# Patient Record
Sex: Female | Born: 1935 | Race: White | Hispanic: No | Smoking: Former smoker
Health system: Southern US, Community
[De-identification: ages and names within clinical notes are randomized; demographics above are authoritative.]

## PROBLEM LIST (undated history)

## (undated) DIAGNOSIS — C3490 Malignant neoplasm of unspecified part of unspecified bronchus or lung: Secondary | ICD-10-CM

## (undated) DIAGNOSIS — K259 Gastric ulcer, unspecified as acute or chronic, without hemorrhage or perforation: Secondary | ICD-10-CM

## (undated) DIAGNOSIS — J910 Malignant pleural effusion: Secondary | ICD-10-CM

## (undated) DIAGNOSIS — J189 Pneumonia, unspecified organism: Secondary | ICD-10-CM

## (undated) DIAGNOSIS — I480 Paroxysmal atrial fibrillation: Secondary | ICD-10-CM

## (undated) DIAGNOSIS — S62109A Fracture of unspecified carpal bone, unspecified wrist, initial encounter for closed fracture: Secondary | ICD-10-CM

## (undated) DIAGNOSIS — J449 Chronic obstructive pulmonary disease, unspecified: Secondary | ICD-10-CM

## (undated) HISTORY — DX: Paroxysmal atrial fibrillation: I48.0

## (undated) HISTORY — DX: Malignant pleural effusion: J91.0

## (undated) HISTORY — PX: CHOLECYSTECTOMY: SHX55

## (undated) HISTORY — DX: Malignant neoplasm of unspecified part of unspecified bronchus or lung: C34.90

## (undated) HISTORY — PX: CATARACT EXTRACTION W/ INTRAOCULAR LENS  IMPLANT, BILATERAL: SHX1307

## (undated) HISTORY — PX: TONSILLECTOMY: SUR1361

## (undated) HISTORY — PX: ABDOMINAL HYSTERECTOMY: SHX81

---

## 1991-03-21 DIAGNOSIS — S62109A Fracture of unspecified carpal bone, unspecified wrist, initial encounter for closed fracture: Secondary | ICD-10-CM

## 1991-03-21 HISTORY — DX: Fracture of unspecified carpal bone, unspecified wrist, initial encounter for closed fracture: S62.109A

## 2008-07-24 ENCOUNTER — Ambulatory Visit: Payer: Self-pay | Admitting: Internal Medicine

## 2009-09-21 ENCOUNTER — Ambulatory Visit: Payer: Self-pay | Admitting: Unknown Physician Specialty

## 2009-11-17 ENCOUNTER — Ambulatory Visit: Payer: Self-pay | Admitting: Unknown Physician Specialty

## 2009-11-19 LAB — PATHOLOGY REPORT

## 2013-09-26 ENCOUNTER — Ambulatory Visit: Payer: Self-pay | Admitting: Osteopathic Medicine

## 2015-02-22 ENCOUNTER — Encounter: Payer: Medicare HMO | Attending: Surgery | Admitting: Surgery

## 2015-02-22 DIAGNOSIS — T8131XA Disruption of external operation (surgical) wound, not elsewhere classified, initial encounter: Secondary | ICD-10-CM | POA: Diagnosis not present

## 2015-02-22 DIAGNOSIS — X58XXXA Exposure to other specified factors, initial encounter: Secondary | ICD-10-CM | POA: Diagnosis not present

## 2015-02-22 DIAGNOSIS — C44722 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, including hip: Secondary | ICD-10-CM | POA: Diagnosis not present

## 2015-02-22 DIAGNOSIS — M1990 Unspecified osteoarthritis, unspecified site: Secondary | ICD-10-CM | POA: Diagnosis not present

## 2015-02-22 DIAGNOSIS — F17218 Nicotine dependence, cigarettes, with other nicotine-induced disorders: Secondary | ICD-10-CM | POA: Diagnosis not present

## 2015-02-22 DIAGNOSIS — L97212 Non-pressure chronic ulcer of right calf with fat layer exposed: Secondary | ICD-10-CM | POA: Insufficient documentation

## 2015-02-22 NOTE — Progress Notes (Addendum)
[name] ([id number]) Visit Report for 02/22/2015 Chief Complaint Document Details Patient Name: [name], [name]. Date of Service: 02/22/2015 8:45 AM Medical Record Number: [id number] Patient Account Number: [id number] Date of Birth/Sex: 03-13-1933 (81 y.o. Female) Treating RN: Cornell Barman Primary Care Physician: Benita Stabile Other Clinician: Referring Physician: Benita Stabile Treating Physician/Extender: Frann Rider in Treatment: 0 Information Obtained from: Patient Chief Complaint Patient presents to the wound care center with open non-healing surgical wound(s) which has been there on the right lower extremity for about 4 weeks Electronic Signature(s) Signed: 02/22/2015 10:04:29 AM By: Christin Fudge MD, FACS Previous Signature: 02/22/2015 9:11:41 AM Version By: Christin Fudge MD, FACS Entered By: Christin Fudge on 02/22/2015 10:04:29 Hodgkiss, [name] ([id number]) -------------------------------------------------------------------------------- Debridement Details Patient Name: [name]. Date of Service: 02/22/2015 8:45 AM Medical Record Number: [id number] Patient Account Number: [id number] Date of Birth/Sex: 1932/04/15 (81 y.o. Female) Treating RN: Cornell Barman Primary Care Physician: Benita Stabile Other Clinician: Referring Physician: Benita Stabile Treating Physician/Extender: Frann Rider in Treatment: 0 Debridement Performed for Wound #1 Right,Anterior Lower Leg Assessment: Performed By: Physician Christin Fudge, MD Debridement: Debridement Pre-procedure Yes Verification/Time Out Taken: Start Time: 09:50 Pain Control: Other : lidocaine 4% Level: Skin/Subcutaneous Tissue Total Area Debrided (L x 3.2 (cm) x 2.8 (cm) = 8.96 (cm) W): Tissue and other Viable, Non-Viable, Fibrin/Slough, Subcutaneous material debrided: Instrument: Curette Bleeding: Moderate Hemostasis Achieved: Pressure End Time: 09:55 Procedural Pain: 3 Post Procedural Pain: 1 Response to  Treatment: Procedure was tolerated well Post Debridement Measurements of Total Wound Length: (cm) 3.2 Width: (cm) 2.8 Depth: (cm) 0.5 Volume: (cm) 3.519 Post Procedure Diagnosis Same as Pre-procedure Electronic Signature(s) Signed: 02/22/2015 10:04:10 AM By: Christin Fudge MD, FACS Signed: 02/22/2015 5:12:08 PM By: Gretta Cool RN, BSN, Kim RN, BSN Entered By: Christin Fudge on 02/22/2015 10:04:10 Poppen, [name] ([id number]) -------------------------------------------------------------------------------- HPI Details Patient Name: [name]. Date of Service: 02/22/2015 8:45 AM Medical Record Number: [id number] Patient Account Number: [id number] Date of Birth/Sex: [birth year] (81 y.o. Female) Treating RN: Cornell Barman Primary Care Physician: Benita Stabile Other Clinician: Referring Physician: Benita Stabile Treating Physician/Extender: Frann Rider in Treatment: 0 History of Present Illness Location: right lower extremity Quality: Patient reports experiencing a dull pain to affected area(s). Severity: Patient states wound are getting worse. Duration: Patient has had the wound for < 4 weeks prior to presenting for treatment Timing: Pain in wound is Intermittent (comes and goes Context: The wound occurred when the patient habits sutures removed from a dermatology excision of a squamous cell carcinoma Modifying Factors: Other treatment(s) tried include: she is on doxycycline at the present time and prior to that had Keflex Associated Signs and Symptoms: Patient reports having difficulty standing for long periods. HPI Description: 81 year old patient sent to [place] by her dermatologist Dr. Kirkland Hun for a decreased surgical wound of the right lower extremity where a excision and primary repair was done in early November. Pathology showed a squamous cell carcinoma with no residual tumor seen. Postoperatively the patient was put on antibiotics for a wound infection and initially she had Keflex  given by her PCP. The patient was then switched to doxycycline by the dermatologist for a presumed MRSA. Sutures were removed 3 weeks later and there was partial dehiscence of the surgical wound with no purulent drainage. At the present time the patient says she has minimal pain but no pus drainage or fever. The patient is a smoker and smokes a packet of cigarettes for the last 65 years  Electronic Signature(s) Signed: 02/22/2015 10:06:10 AM By: Christin Fudge MD, FACS Previous Signature: 02/22/2015 9:14:57 AM Version By: Christin Fudge MD, FACS Entered By: Christin Fudge on 02/22/2015 10:06:09 Shellenbarger, [name] ([id number]) -------------------------------------------------------------------------------- Physical Exam Details Patient Name: [name]. Date of Service: 02/22/2015 8:45 AM Medical Record Number: [id number] Patient Account Number: [id number] Date of Birth/Sex: 07/15/1932 (81 y.o. Female) Treating RN: Cornell Barman Primary Care Physician: Benita Stabile Other Clinician: Referring Physician: Benita Stabile Treating Physician/Extender: Frann Rider in Treatment: 0 Constitutional . Pulse regular. Respirations normal and unlabored. Afebrile. . Eyes Nonicteric. Reactive to light. Ears, Nose, Mouth, and Throat Lips, teeth, and gums WNL.Marland Kitchen Moist mucosa without lesions . Neck supple and nontender. No palpable supraclavicular or cervical adenopathy. Normal sized without goiter. Respiratory WNL. No retractions.. Cardiovascular Pedal Pulses WNL. her ABI on the right lower extremity is 1.06.. No clubbing, cyanosis or edema. Gastrointestinal (GI) Abdomen without masses or tenderness.. No liver or spleen enlargement or tenderness.. Lymphatic No adneopathy. No adenopathy. No adenopathy. Musculoskeletal Adexa without tenderness or enlargement.. Digits and nails w/o clubbing, cyanosis, infection, petechiae, ischemia, or inflammatory conditions.. Integumentary (Hair, Skin) No suspicious  lesions. No crepitus or fluctuance. No peri-wound warmth or erythema. No masses.Marland Kitchen Psychiatric Judgement and insight Intact.. No evidence of depression, anxiety, or agitation.. Notes She has a large open wound with significant undermining and a lot of slough at the base. there are some smaller puncture wounds also superior and inferior to the main wound. Electronic Signature(s) Signed: 02/22/2015 10:17:09 AM By: Christin Fudge MD, FACS Entered By: Christin Fudge on 02/22/2015 10:17:08 Neidlinger, [name] ([id number]) -------------------------------------------------------------------------------- Physician Orders Details Patient Name: [name]. Date of Service: 02/22/2015 8:45 AM Medical Record Number: [id number] Patient Account Number: [id number] Date of Birth/Sex: July 18, 1932 (81 y.o. Female) Treating RN: Cornell Barman Primary Care Physician: Benita Stabile Other Clinician: Referring Physician: Benita Stabile Treating Physician/Extender: Frann Rider in Treatment: 0 Verbal / Phone Orders: Yes Clinician: Cornell Barman Read Back and Verified: Yes Diagnosis Coding ICD-10 Coding Code Description [phone] 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, including hip Disruption of external operation (surgical) wound, not elsewhere classified, initial T81.31XA encounter L97.212 Non-pressure chronic ulcer of right calf with fat layer exposed F17.218 Nicotine dependence, cigarettes, with other nicotine-induced disorders Wound Cleansing Wound #1 Right,Anterior Lower Leg o Clean wound with Normal Saline. Anesthetic Wound #1 Right,Anterior Lower Leg o Topical Lidocaine 4% cream applied to wound bed prior to debridement Primary Wound Dressing Wound #1 Right,Anterior Lower Leg o Santyl Ointment Secondary Dressing Wound #1 Right,Anterior Lower Leg o Boardered Foam Dressing Dressing Change Frequency Wound #1 Right,Anterior Lower Leg o Change dressing every day. Follow-up  Appointments Wound #1 Right,Anterior Lower Leg o Return Appointment in 1 week. Edema Control Wound #1 Right,Anterior Lower Leg Balke, Starla R. ([id number]) o Patient to wear own compression stockings o Elevate legs to the level of the heart and pump ankles as often as possible Additional Orders / Instructions Wound #1 Right,Anterior Lower Leg o Stop Smoking Patient Medications Allergies: sulfamethizole Notifications Medication Indication Start End Santyl 02/22/2015 DOSE topical 250 unit/gram ointment - ointment topical as directed Electronic Signature(s) Signed: 02/22/2015 10:03:36 AM By: Christin Fudge MD, FACS Entered By: Christin Fudge on 02/22/2015 10:03:36 Erler, [name] ([id number]) -------------------------------------------------------------------------------- Problem List Details Patient Name: [name]. Date of Service: 02/22/2015 8:45 AM Medical Record Number: [id number] Patient Account Number: [id number] Date of Birth/Sex: [birth year] (81 y.o. Female) Treating RN: Cornell Barman Primary Care Physician: Benita Stabile Other Clinician:  Referring Physician: TATE, Sharlet Salina Treating Physician/Extender: Frann Rider in Treatment: 0 Active Problems ICD-10 Encounter Code Description Active Date Diagnosis C44.722 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, 02/22/2015 Yes including hip T81.31XA Disruption of external operation (surgical) wound, not 02/22/2015 Yes elsewhere classified, initial encounter L97.212 Non-pressure chronic ulcer of right calf with fat layer 02/22/2015 Yes exposed F17.218 Nicotine dependence, cigarettes, with other nicotine- 02/22/2015 Yes induced disorders Inactive Problems Resolved Problems Electronic Signature(s) Signed: 02/22/2015 10:03:59 AM By: Christin Fudge MD, FACS Previous Signature: 02/22/2015 9:15:23 AM Version By: Christin Fudge MD, FACS Previous Signature: 02/22/2015 9:11:27 AM Version By: Christin Fudge MD, FACS Entered By:  Christin Fudge on 02/22/2015 10:03:59 Aguayo, [name] ([id number]) -------------------------------------------------------------------------------- Progress Note Details Patient Name: Debois, [name]. Date of Service: 02/22/2015 8:45 AM Medical Record Number: [id number] Patient Account Number: [id number] Date of Birth/Sex: May 15, 1932 (81 y.o. Female) Treating RN: Cornell Barman Primary Care Physician: Benita Stabile Other Clinician: Referring Physician: Benita Stabile Treating Physician/Extender: Frann Rider in Treatment: 0 Subjective Chief Complaint Information obtained from Patient Patient presents to the wound care center with open non-healing surgical wound(s) which has been there on the right lower extremity for about 4 weeks History of Present Illness (HPI) The following HPI elements were documented for the patient's wound: Location: right lower extremity Quality: Patient reports experiencing a dull pain to affected area(s). Severity: Patient states wound are getting worse. Duration: Patient has had the wound for < 4 weeks prior to presenting for treatment Timing: Pain in wound is Intermittent (comes and goes Context: The wound occurred when the patient habits sutures removed from a dermatology excision of a squamous cell carcinoma Modifying Factors: Other treatment(s) tried include: she is on doxycycline at the present time and prior to that had Keflex Associated Signs and Symptoms: Patient reports having difficulty standing for long periods. 81 year old patient sent to [place] by her dermatologist Dr. Kirkland Hun for a decreased surgical wound of the right lower extremity where a excision and primary repair was done in early November. Pathology showed a squamous cell carcinoma with no residual tumor seen. Postoperatively the patient was put on antibiotics for a wound infection and initially she had Keflex given by her PCP. The patient was then switched to doxycycline by the  dermatologist for a presumed MRSA. Sutures were removed 3 weeks later and there was partial dehiscence of the surgical wound with no purulent drainage. At the present time the patient says she has minimal pain but no pus drainage or fever. The patient is a smoker and smokes a packet of cigarettes for the last 65 years Wound History Patient presents with 1 open wound that has been present for approximately 4 weeks. Patient has been treating wound in the following manner: wet to dry and vasaline. Laboratory tests have been performed in the last month. Patient reportedly has tested positive for an antibiotic resistant organism. Patient reportedly has not tested positive for osteomyelitis. Patient reportedly has not had testing performed to evaluate circulation in the legs. Patient experiences the following problems associated with their wounds: infection. Patient History Information obtained from Patient. GAYLA, BENN ([id number]) Allergies sulfamethizole Family History Heart Disease - Mother, Maternal Grandparents, Hypertension - Child, Stroke - Mother, No family history of Cancer, Diabetes, Hereditary Spherocytosis, Kidney Disease, Lung Disease, Seizures, Thyroid Problems. Social History Current every day smoker, Marital Status - Widowed, Alcohol Use - Never, Drug Use - No History, Caffeine Use - Daily. Medical History Eyes Patient has history of Cataracts -  surgery Denies history of Glaucoma, Optic Neuritis Ear/Nose/Mouth/Throat Denies history of Chronic sinus problems/congestion, Middle ear problems Hematologic/Lymphatic Denies history of Anemia, Hemophilia, Human Immunodeficiency Virus, Lymphedema Respiratory Denies history of Aspiration, Asthma, Chronic Obstructive Pulmonary Disease (COPD), Pneumothorax, Sleep Apnea, Tuberculosis Cardiovascular Denies history of Angina, Arrhythmia, Congestive Heart Failure, Coronary Artery Disease, Deep Vein Thrombosis, Hypertension,  Hypotension, Myocardial Infarction, Peripheral Arterial Disease, Peripheral Venous Disease, Phlebitis, Vasculitis Gastrointestinal Denies history of Cirrhosis , Colitis, Crohn s, Hepatitis A, Hepatitis B, Hepatitis C Endocrine Denies history of Type I Diabetes, Type II Diabetes Genitourinary Denies history of End Stage Renal Disease Immunological Denies history of Lupus Erythematosus, Raynaud s, Scleroderma Integumentary (Skin) Denies history of History of Burn, History of pressure wounds Musculoskeletal Patient has history of Osteoarthritis Denies history of Gout, Rheumatoid Arthritis, Osteomyelitis Neurologic Denies history of Dementia, Neuropathy, Quadriplegia, Paraplegia, Seizure Disorder Oncologic Denies history of Received Chemotherapy, Received Radiation Psychiatric Denies history of Anorexia/bulimia, Confinement Anxiety Medical And Surgical History Notes Constitutional Symptoms (General Health) Heidel, ADDLEY BALLINGER. ([id number]) gall bladder surgery 15 years ago Review of Systems (ROS) Constitutional Symptoms (General Health) The patient has no complaints or symptoms. Eyes The patient has no complaints or symptoms. Ear/Nose/Mouth/Throat The patient has no complaints or symptoms. Hematologic/Lymphatic The patient has no complaints or symptoms. Respiratory The patient has no complaints or symptoms. Cardiovascular Complains or has symptoms of LE edema. Gastrointestinal The patient has no complaints or symptoms. Endocrine The patient has no complaints or symptoms. Genitourinary The patient has no complaints or symptoms. Immunological The patient has no complaints or symptoms. Integumentary (Skin) Complains or has symptoms of Wounds, Bleeding or bruising tendency. Denies complaints or symptoms of Breakdown, Swelling. Musculoskeletal The patient has no complaints or symptoms. Neurologic The patient has no complaints or symptoms. Oncologic The patient has no complaints  or symptoms. Psychiatric The patient has no complaints or symptoms. Medications doxycycline hyclate 100 mg capsule oral 1 1 capsule oral Santyl 250 unit/gram topical ointment topical ointment topical as directed Objective Constitutional Steadman, Marylyn R. ([id number]) Pulse regular. Respirations normal and unlabored. Afebrile. Vitals Time Taken: 9:08 AM, Height: 63 in, Source: Stated, Weight: 180 lbs, Source: Stated, BMI: 31.9, Temperature: 98.2 F, Pulse: 72 bpm, Respiratory Rate: 16 breaths/min, Blood Pressure: 136/55 mmHg. Eyes Nonicteric. Reactive to light. Ears, Nose, Mouth, and Throat Lips, teeth, and gums WNL.Marland Kitchen Moist mucosa without lesions . Neck supple and nontender. No palpable supraclavicular or cervical adenopathy. Normal sized without goiter. Respiratory WNL. No retractions.. Cardiovascular Pedal Pulses WNL. her ABI on the right lower extremity is 1.06.. No clubbing, cyanosis or edema. Gastrointestinal (GI) Abdomen without masses or tenderness.. No liver or spleen enlargement or tenderness.. Lymphatic No adneopathy. No adenopathy. No adenopathy. Musculoskeletal Adexa without tenderness or enlargement.. Digits and nails w/o clubbing, cyanosis, infection, petechiae, ischemia, or inflammatory conditions.Marland Kitchen Psychiatric Judgement and insight Intact.. No evidence of depression, anxiety, or agitation.. General Notes: She has a large open wound with significant undermining and a lot of slough at the base. there are some smaller puncture wounds also superior and inferior to the main wound. Integumentary (Hair, Skin) No suspicious lesions. No crepitus or fluctuance. No peri-wound warmth or erythema. No masses.. Wound #1 status is Open. Original cause of wound was Surgical Injury. The wound is located on the Right,Anterior Lower Leg. The wound measures 3.2cm length x 2.8cm width x 0.4cm depth; 7.037cm^2 area and 2.815cm^3 volume. The wound is limited to skin breakdown. There is no  tunneling noted, however, there is undermining starting at 10:00 and  ending at 3:00 with a maximum distance of 0.9cm. There is a medium amount of serous drainage noted. The wound margin is distinct with the outline attached to the wound base. There is small (1-33%) pink granulation within the wound bed. There is a medium (34- 66%) amount of necrotic tissue within the wound bed including Adherent Slough. The periwound skin appearance exhibited: Scarring, Moist, Erythema. The periwound skin appearance did not exhibit: Callus, Crepitus, Excoriation, Fluctuance, Friable, Induration, Localized Edema, Rash, Dry/Scaly, Maceration, Atrophie Blanche, Cyanosis, Ecchymosis, Hemosiderin Staining, Mottled, Pallor, Rubor. The surrounding Tarazon, Cleopha R. ([id number]) wound skin color is noted with erythema which is circumferential. Periwound temperature was noted as No Abnormality. The periwound has tenderness on palpation. Assessment Active Problems ICD-10 C44.722 - 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, including hip T81.31XA - Disruption of external operation (surgical) wound, not elsewhere classified, initial encounter L97.212 - Non-pressure chronic ulcer of right calf with fat layer exposed F17.218 - Nicotine dependence, cigarettes, with other nicotine-induced disorders This 81 year old patient with a surgical dehiscence from a squamous cell carcinoma skin excision and closure now has MRSA cultured from the wound. I have recommended she completes her course of doxycycline which was already prescribed. I have recommended Santyl ointment to be changed daily and aborted for him to be applied. She is also using some compression stockings which will help with her minimal edema. I spent about 5 minutes discussing with her the need to completely give up smoking and the detrimental effects of nicotine on wound healing. She and her granddaughter have at all questions answered about this and say  they will be compliant. She will come back and see me at weekly intervals. Procedures Wound #1 Wound #1 is a Malignant Wound located on the Right,Anterior Lower Leg . There was a Skin/Subcutaneous Tissue Debridement ([id number]) debridement with total area of 8.96 sq cm performed by Christin Fudge, MD. with the following instrument(s): Curette to remove Viable and Non-Viable tissue/material including Fibrin/Slough and Subcutaneous after achieving pain control using Other (lidocaine 4%). A time out was conducted prior to the start of the procedure. A Moderate amount of bleeding was controlled with Pressure. The procedure was tolerated well with a pain level of 3 throughout and a pain level of 1 following the procedure. Post Debridement Measurements: 3.2cm length x 2.8cm width x 0.5cm depth; 3.519cm^3 volume. Post procedure Diagnosis Wound #1: Same as Pre-Procedure Strawderman, Jani R. ([id number]) Plan Wound Cleansing: Wound #1 Right,Anterior Lower Leg: Clean wound with Normal Saline. Anesthetic: Wound #1 Right,Anterior Lower Leg: Topical Lidocaine 4% cream applied to wound bed prior to debridement Primary Wound Dressing: Wound #1 Right,Anterior Lower Leg: Santyl Ointment Secondary Dressing: Wound #1 Right,Anterior Lower Leg: Boardered Foam Dressing Dressing Change Frequency: Wound #1 Right,Anterior Lower Leg: Change dressing every day. Follow-up Appointments: Wound #1 Right,Anterior Lower Leg: Return Appointment in 1 week. Edema Control: Wound #1 Right,Anterior Lower Leg: Patient to wear own compression stockings Elevate legs to the level of the heart and pump ankles as often as possible Additional Orders / Instructions: Wound #1 Right,Anterior Lower Leg: Stop Smoking The following medication(s) was prescribed: Santyl topical 250 unit/gram ointment ointment topical as directed starting 02/22/2015 This 81 year old patient with a surgical dehiscence from a squamous cell carcinoma  skin excision and closure now has MRSA cultured from the wound. I have recommended she completes her course of doxycycline which was already prescribed. I have recommended Santyl ointment to be changed daily and aborted for him to be applied. She  is also using some compression stockings which will help with her minimal edema. I spent about 5 minutes discussing with her the need to completely give up smoking and the detrimental effects of nicotine on wound healing. She and her granddaughter have at all questions answered about this and say they will be compliant. PORCHA, DEBLANC ([id number]) She will come back and see me at weekly intervals. Electronic Signature(s) Signed: 02/22/2015 4:23:28 PM By: Christin Fudge MD, FACS Previous Signature: 02/22/2015 10:18:52 AM Version By: Christin Fudge MD, FACS Entered By: Christin Fudge on 02/22/2015 16:23:28 Gavitt, [name] ([id number]) -------------------------------------------------------------------------------- ROS/PFSH Details Patient Name: [name]. Date of Service: 02/22/2015 8:45 AM Medical Record Number: [id number] Patient Account Number: [id number] Date of Birth/Sex: 04-Nov-1932 (81 y.o. Female) Treating RN: Cornell Barman Primary Care Physician: Benita Stabile Other Clinician: Referring Physician: Benita Stabile Treating Physician/Extender: Frann Rider in Treatment: 0 Information Obtained From Patient Wound History Do you currently have one or more open woundso Yes How many open wounds do you currently haveo 1 Approximately how long have you had your woundso 4 weeks How have you been treating your wound(s) until nowo wet to dry and vasaline Has your wound(s) ever healed and then re-openedo No Have you had any lab work done in the past montho Yes Have you tested positive for an antibiotic resistant organism (MRSA, VRE)o Yes Date: 02/02/2015 Have you tested positive for osteomyelitis (bone infection)o No Have you had any tests for  circulation on your legso No Have you had other problems associated with your woundso Infection Cardiovascular Complaints and Symptoms: Positive for: LE edema Medical History: Negative for: Angina; Arrhythmia; Congestive Heart Failure; Coronary Artery Disease; Deep Vein Thrombosis; Hypertension; Hypotension; Myocardial Infarction; Peripheral Arterial Disease; Peripheral Venous Disease; Phlebitis; Vasculitis Integumentary (Skin) Complaints and Symptoms: Positive for: Wounds; Bleeding or bruising tendency Negative for: Breakdown; Swelling Medical History: Negative for: History of Burn; History of pressure wounds Constitutional Symptoms (General Health) Complaints and Symptoms: No Complaints or Symptoms Medical History: Past Medical History Notes: CLARABELLE, OSCARSON. ([id number]) gall bladder surgery 15 years ago Eyes Complaints and Symptoms: No Complaints or Symptoms Medical History: Positive for: Cataracts - surgery Negative for: Glaucoma; Optic Neuritis Ear/Nose/Mouth/Throat Complaints and Symptoms: No Complaints or Symptoms Medical History: Negative for: Chronic sinus problems/congestion; Middle ear problems Hematologic/Lymphatic Complaints and Symptoms: No Complaints or Symptoms Medical History: Negative for: Anemia; Hemophilia; Human Immunodeficiency Virus; Lymphedema Respiratory Complaints and Symptoms: No Complaints or Symptoms Medical History: Negative for: Aspiration; Asthma; Chronic Obstructive Pulmonary Disease (COPD); Pneumothorax; Sleep Apnea; Tuberculosis Gastrointestinal Complaints and Symptoms: No Complaints or Symptoms Medical History: Negative for: Cirrhosis ; Colitis; Crohnos; Hepatitis A; Hepatitis B; Hepatitis C Endocrine Complaints and Symptoms: No Complaints or Symptoms Medical History: Negative for: Type I Diabetes; Type II Diabetes Kraeger, Medora R. ([id number]) Genitourinary Complaints and Symptoms: No Complaints or Symptoms Medical  History: Negative for: End Stage Renal Disease Immunological Complaints and Symptoms: No Complaints or Symptoms Medical History: Negative for: Lupus Erythematosus; Raynaudos; Scleroderma Musculoskeletal Complaints and Symptoms: No Complaints or Symptoms Medical History: Positive for: Osteoarthritis Negative for: Gout; Rheumatoid Arthritis; Osteomyelitis Neurologic Complaints and Symptoms: No Complaints or Symptoms Medical History: Negative for: Dementia; Neuropathy; Quadriplegia; Paraplegia; Seizure Disorder Oncologic Complaints and Symptoms: No Complaints or Symptoms Medical History: Negative for: Received Chemotherapy; Received Radiation Psychiatric Complaints and Symptoms: No Complaints or Symptoms Medical History: Negative for: Anorexia/bulimia; Confinement Anxiety HBO Extended History Items Eyes: Cataracts Widjaja, Elmire R. ([id number]) Family and Social History Cancer: No; Diabetes: No; Heart  Disease: Yes - Mother, Maternal Grandparents; Hereditary Spherocytosis: No; Hypertension: Yes - Child; Kidney Disease: No; Lung Disease: No; Seizures: No; Stroke: Yes - Mother; Thyroid Problems: No; Current every day smoker; Marital Status - Widowed; Alcohol Use: Never; Drug Use: No History; Caffeine Use: Daily; Advanced Directives: No; Patient does not want information on Advanced Directives; Living Will: No; Medical Power of Attorney: No Physician Affirmation I have reviewed and agree with the above information. Electronic Signature(s) Signed: 02/22/2015 9:38:54 AM By: Christin Fudge MD, FACS Signed: 02/22/2015 5:12:08 PM By: Gretta Cool RN, BSN, Kim RN, BSN Entered By: Christin Fudge on 02/22/2015 09:38:52 Recchia, [name] ([id number]) -------------------------------------------------------------------------------- [facility] Details Patient Name: AAYRA, HORNBAKER. Date of Service: 02/22/2015 Medical Record Number: [id number] Patient Account Number: [id number] Date of Birth/Sex: 1932/06/23  (81 y.o. 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, including hip Disruption of external operation (surgical) wound, not elsewhere classified, initial T81.31XA encounter L97.212 Non-pressure chronic ulcer of right calf with fat layer exposed F17.218 Nicotine dependence, cigarettes, with other nicotine-induced disorders Facility Procedures CPT4: Description Modifier Quantity Code [id number] 99213 - WOUND CARE VISIT-LEV 3 EST PT 1 CPT4: 03474259 11042 - DEB SUBQ TISSUE 20 SQ CM/< 1 ICD-10 Description Diagnosis C44.722 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, including hip T81.31XA Disruption of external operation (surgical) wound, not elsewhere classified, initial  encounter L97.212 Non-pressure chronic ulcer of right calf with fat layer exposed CPT4: 56387564 99406-SMOKING CESSATION 3-10MINS 1 ICD-10 Description Diagnosis C44.722 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

## 2015-02-23 NOTE — Progress Notes (Signed)
LAILONI, BAQUERA ([id number]) Visit Report for 02/22/2015 Abuse/Suicide Risk Screen Details Patient Name: [name], [name]. Date of Service: 02/22/2015 8:45 AM Medical Record Number: [id number] Patient Account Number: [id number] Date of Birth/Sex: 06/05/1932 (81 y.o. Female) Treating RN: Cornell Barman Primary Care Physician: Benita Stabile Other Clinician: Referring Physician: Benita Stabile Treating Physician/Extender: Frann Rider in Treatment: 0 Abuse/Suicide Risk Screen Items Answer ABUSE/SUICIDE RISK SCREEN: Has anyone close to you tried to hurt or harm you recentlyo No Do you feel uncomfortable with anyone in your familyo No Has anyone forced you do things that you didnot want to doo No Do you have any thoughts of harming yourselfo No Patient displays signs or symptoms of abuse and/or neglect. No Electronic Signature(s) Signed: 02/22/2015 5:12:08 PM By: Gretta Cool, RN, BSN, Kim RN, BSN Entered By: Gretta Cool, RN, BSN, Kim on 02/22/2015 09:31:35 Mealy, [name] ([id number]) -------------------------------------------------------------------------------- Activities of Daily Living Details Patient Name: [name]. Date of Service: 02/22/2015 8:45 AM Medical Record Number: [id number] Patient Account Number: [id number] Date of Birth/Sex: 1933-03-19 (81 y.o. Female) Treating RN: Cornell Barman Primary Care Physician: Benita Stabile Other Clinician: Referring Physician: Benita Stabile Treating Physician/Extender: Frann Rider in Treatment: 0 Activities of Daily Living Items Answer Activities of Daily Living (Please select one for each item) Drive Automobile Completely Able Take Medications Completely Able Use Telephone Completely Able Care for Appearance Completely Able Use Toilet Completely Able Bath / Shower Completely Able Dress Self Completely Able Feed Self Completely Able Walk Completely Able Get In / Out Bed Completely Able Housework Completely Able Prepare Meals Completely  Able Handle Money Completely Able Shop for Self Completely Able Electronic Signature(s) Signed: 02/22/2015 5:12:08 PM By: Gretta Cool, RN, BSN, Kim RN, BSN Entered By: Gretta Cool, RN, BSN, Kim on 02/22/2015 09:31:54 Keo, [name] ([id number]) -------------------------------------------------------------------------------- Education Assessment Details Patient Name: [name]. Date of Service: 02/22/2015 8:45 AM Medical Record Number: [id number] Patient Account Number: [id number] Date of Birth/Sex: [birth year] (81 y.o. Female) Treating RN: Cornell Barman Primary Care Physician: Benita Stabile Other Clinician: Referring Physician: Benita Stabile Treating Physician/Extender: Frann Rider in Treatment: 0 Primary Learner Assessed: Patient Learning Preferences/Education Level/Primary Language Learning Preference: Explanation, Demonstration Highest Education Level: High School Preferred Language: English Cognitive Barrier Assessment/Beliefs Language Barrier: No Translator Needed: No Memory Deficit: No Emotional Barrier: No Cultural/Religious Beliefs Affecting Medical No Care: Physical Barrier Assessment Impaired Vision: No Impaired Hearing: Yes Hearing Aid Decreased Hand dexterity: No Knowledge/Comprehension Assessment Knowledge Level: High Comprehension Level: High Ability to understand written High instructions: Ability to understand verbal High instructions: Motivation Assessment Anxiety Level: Calm Cooperation: Cooperative Education Importance: Acknowledges Need Interest in Health Problems: Asks Questions Perception: Coherent Willingness to Engage in Self- High Management Activities: Readiness to Engage in Self- High Management Activities: Electronic Signature(s) AYIANA, WINSLETT ([id number]) Signed: 02/22/2015 5:12:08 PM By: Gretta Cool, RN, BSN, Kim RN, BSN Entered By: Gretta Cool, RN, BSN, Kim on 02/22/2015 09:32:42 Schuneman, [name]  ([id number]) -------------------------------------------------------------------------------- Fall Risk Assessment Details Patient Name: [name]. Date of Service: 02/22/2015 8:45 AM Medical Record Number: [id number] Patient Account Number: [id number] Date of Birth/Sex: May 22, 1932 (81 y.o. Female) Treating RN: Cornell Barman Primary Care Physician: Benita Stabile Other Clinician: Referring Physician: Benita Stabile Treating Physician/Extender: Frann Rider in Treatment: 0 Fall Risk Assessment Items Have you had 2 or more falls in the last 12 monthso 0 No Have you had any fall that resulted in injury in the last 12 monthso 0 No FALL RISK ASSESSMENT: History of falling - immediate or  within 3 months 0 No Secondary diagnosis 0 No Ambulatory aid None/bed rest/wheelchair/nurse 0 Yes Crutches/cane/walker 0 No Furniture 0 No IV Access/Saline Lock 0 No Gait/Training Normal/bed rest/immobile 0 Yes Weak 0 No Impaired 0 No Mental Status Oriented to own ability 0 Yes Electronic Signature(s) Signed: 02/22/2015 5:12:08 PM By: Gretta Cool, RN, BSN, Kim RN, BSN Entered By: Gretta Cool, RN, BSN, Kim on 02/22/2015 09:32:58 Rice, [name] ([id number]) -------------------------------------------------------------------------------- Foot Assessment Details Patient Name: [name]. Date of Service: 02/22/2015 8:45 AM Medical Record Number: [id number] Patient Account Number: [id number] Date of Birth/Sex: 1933-03-03 (81 y.o. Female) Treating RN: Cornell Barman Primary Care Physician: Benita Stabile Other Clinician: Referring Physician: Benita Stabile Treating Physician/Extender: Frann Rider in Treatment: 0 Foot Assessment Items Site Locations + = Sensation present, - = Sensation absent, C = Callus, U = Ulcer R = Redness, W = Warmth, M = Maceration, PU = Pre-ulcerative lesion F = Fissure, S = Swelling, D = Dryness Assessment Right: Left: Other Deformity: No No Prior Foot Ulcer: No No Prior Amputation:  No No Charcot Joint: No No Ambulatory Status: Gait: Electronic Signature(s) Signed: 02/22/2015 5:12:08 PM By: Gretta Cool, RN, BSN, Kim RN, BSN Entered By: Gretta Cool, RN, BSN, Kim on 02/22/2015 09:33:11 Gottwald, [name] ([id number]) -------------------------------------------------------------------------------- Nutrition Risk Assessment Details Patient Name: [name]. Date of Service: 02/22/2015 8:45 AM Medical Record Number: [id number] Patient Account Number: [id number] Date of Birth/Sex: 06-17-1932 (81 y.o. Female) Treating RN: Cornell Barman Primary Care Physician: Benita Stabile Other Clinician: Referring Physician: Benita Stabile Treating Physician/Extender: Frann Rider in Treatment: 0 Height (in): 63 Weight (lbs): 180 Body Mass Index (BMI): 31.9 Nutrition Risk Assessment Items NUTRITION RISK SCREEN: I have an illness or condition that made me change the kind and/or 0 No amount of food I eat I eat fewer than two meals per day 0 No I eat few fruits and vegetables, or milk products 0 No I have three or more drinks of beer, liquor or wine almost every day 0 No I have tooth or mouth problems that make it hard for me to eat 0 No I don't always have enough money to buy the food I need 0 No I eat alone most of the time 0 No I take three or more different prescribed or over-the-counter drugs a 0 No day Without wanting to, I have lost or gained 10 pounds in the last six 0 No months I am not always physically able to shop, cook and/or feed myself 0 No Nutrition Protocols Good Risk Protocol 0 No interventions needed Moderate Risk Protocol Electronic Signature(s) Signed: 02/22/2015 5:12:08 PM By: Gretta Cool, RN, BSN, Kim RN, BSN Entered By: Gretta Cool, RN, BSN, Kim on 02/22/2015 09:33:06

## 2015-02-23 NOTE — Progress Notes (Signed)
CLEMIE, GENERAL ([id number]) Visit Report for 02/22/2015 Allergy List Details Patient Name: [name], [name]. Date of Service: 02/22/2015 8:45 AM Medical Record Number: [id number] Patient Account Number: [id number] Date of Birth/Sex: 04-19-1932 (81 y.o. Female) Treating RN: Cornell Barman Primary Care Physician: Benita Stabile Other Clinician: Referring Physician: Benita Stabile Treating Physician/Extender: Frann Rider in Treatment: 0 Allergies Active Allergies sulfamethizole Allergy Notes Electronic Signature(s) Signed: 02/22/2015 5:12:08 PM By: Gretta Cool, RN, BSN, Kim RN, BSN Entered By: Gretta Cool, RN, BSN, Kim on 02/22/2015 09:25:25 Mcclenahan, [name] ([id number]) -------------------------------------------------------------------------------- Arrival Information Details Patient Name: [name]. Date of Service: 02/22/2015 8:45 AM Medical Record Number: [id number] Patient Account Number: [id number] Date of Birth/Sex: [birth year] (81 y.o. Female) Treating RN: Cornell Barman Primary Care Physician: Benita Stabile Other Clinician: Referring Physician: Benita Stabile Treating Physician/Extender: Frann Rider in Treatment: 0 Visit Information Patient Arrived: Ambulatory Arrival Time: 09:06 Accompanied By: grand- daughter Transfer Assistance: None Patient Identification Verified: Yes Secondary Verification Process Yes Completed: Patient Requires Transmission-Based Yes Precautions: Transmission-Based Precautions: Contact MRSA Patient Has Alerts: Yes Electronic Signature(s) Signed: 02/22/2015 5:12:08 PM By: Gretta Cool, RN, BSN, Kim RN, BSN Entered By: Gretta Cool, RN, BSN, Kim on 02/22/2015 09:07:03 Terriquez, [name] ([id number]) -------------------------------------------------------------------------------- Clinic Level of Care Assessment Details Patient Name: [name]. Date of Service: 02/22/2015 8:45 AM Medical Record Number: [id number] Patient Account Number: [id number] Date of Birth/Sex: 1933/02/05  (81 y.o. Female) Treating RN: Cornell Barman Primary Care Physician: Benita Stabile Other Clinician: Referring Physician: Benita Stabile Treating Physician/Extender: Frann Rider in Treatment: 0 Clinic Level of Care Assessment Items TOOL 1 Quantity Score '[]'$  - Use when EandM and Procedure is performed on INITIAL visit 0 ASSESSMENTS - Nursing Assessment / Reassessment '[]'$  - General Physical Exam (combine w/ comprehensive assessment (listed just 0 below) when performed on new pt. evals) X - Comprehensive Assessment (HX, ROS, Risk Assessments, Wounds Hx, etc.) 1 25 ASSESSMENTS - Wound and Skin Assessment / Reassessment '[]'$  - Dermatologic / Skin Assessment (not related to wound area) 0 ASSESSMENTS - Ostomy and/or Continence Assessment and Care '[]'$  - Incontinence Assessment and Management 0 '[]'$  - Ostomy Care Assessment and Management (repouching, etc.) 0 PROCESS - Coordination of Care X - Simple Patient / Family Education for ongoing care 1 15 '[]'$  - Complex (extensive) Patient / Family Education for ongoing care 0 X - Staff obtains Programmer, systems, Records, Test Results / Process Orders 1 10 '[]'$  - Staff telephones HHA, Nursing Homes / Clarify orders / etc 0 '[]'$  - Routine Transfer to another Facility (non-emergent condition) 0 '[]'$  - Routine Hospital Admission (non-emergent condition) 0 X - New Admissions / Biomedical engineer / Ordering NPWT, Apligraf, etc. 1 15 '[]'$  - Emergency Hospital Admission (emergent condition) 0 PROCESS - Special Needs '[]'$  - Pediatric / Minor Patient Management 0 '[]'$  - Isolation Patient Management 0 Sookdeo, Gale R. ([id number]) '[]'$  - Hearing / Language / Visual special needs 0 '[]'$  - Assessment of Community assistance (transportation, D/C planning, etc.) 0 '[]'$  - Additional assistance / Altered mentation 0 '[]'$  - Support Surface(s) Assessment (bed, cushion, seat, etc.) 0 INTERVENTIONS - Miscellaneous '[]'$  - External ear exam 0 '[]'$  - Patient Transfer (multiple staff / Civil Service fast streamer / Similar  devices) 0 '[]'$  - Simple Staple / Suture removal (25 or less) 0 '[]'$  - Complex Staple / Suture removal (26 or more) 0 '[]'$  - Hypo/Hyperglycemic Management (do not check if billed separately) 0 X - Ankle / Brachial Index (ABI) - do not check if billed separately 1 15 Has  the patient been seen at the hospital within the last three years: Yes Total Score: 80 Level Of Care: New/Established - Level 3 Electronic Signature(s) Signed: 02/22/2015 5:12:08 PM By: Gretta Cool, RN, BSN, Kim RN, BSN Entered By: Gretta Cool, RN, BSN, Kim on 02/22/2015 09:55:37 Rufus, [name] ([id number]) -------------------------------------------------------------------------------- Encounter Discharge Information Details Patient Name: [name]. Date of Service: 02/22/2015 8:45 AM Medical Record Number: [id number] Patient Account Number: [id number] Date of Birth/Sex: 26-Feb-1933 (81 y.o. Female) Treating RN: Cornell Barman Primary Care Physician: Benita Stabile Other Clinician: Referring Physician: Benita Stabile Treating Physician/Extender: Frann Rider in Treatment: 0 Encounter Discharge Information Items Discharge Pain Level: 0 Discharge Condition: Stable Ambulatory Status: Ambulatory Discharge Destination: Home Transportation: Private Auto Accompanied By: granddaughter Schedule Follow-up Appointment: Yes Medication Reconciliation completed and provided to Yes Patient/Care Jhania Etherington: Provided on Clinical Summary of Care: 02/22/2015 Form Type Recipient Paper Patient JW Electronic Signature(s) Signed: 02/22/2015 10:08:38 AM By: Ruthine Dose Entered By: Ruthine Dose on 02/22/2015 10:08:38 Mckeough, [name] ([id number]) -------------------------------------------------------------------------------- Lower Extremity Assessment Details Patient Name: [name]. Date of Service: 02/22/2015 8:45 AM Medical Record Number: [id number] Patient Account Number: [id number] Date of Birth/Sex: 12/06/1932 (81 y.o. Female) Treating RN:  Cornell Barman Primary Care Physician: Benita Stabile Other Clinician: Referring Physician: Benita Stabile Treating Physician/Extender: Frann Rider in Treatment: 0 Edema Assessment Assessed: [Left: No] [Right: No] E[Left: dema] [Right: :] Calf Left: Right: Point of Measurement: 33 cm From Medial Instep cm 32.6 cm Ankle Left: Right: Point of Measurement: 10 cm From Medial Instep cm 23.2 cm Vascular Assessment Pulses: Posterior Tibial Palpable: [Right:Yes] Dorsalis Pedis Palpable: [Right:Yes] Extremity colors, hair growth, and conditions: Extremity Color: [Right:Normal] Hair Growth on Extremity: [Right:No] Temperature of Extremity: [Right:Cool] Capillary Refill: [Right:< 3 seconds] Blood Pressure: Brachial: [Right:142] Dorsalis Pedis: [Left:Dorsalis Pedis: 244] Ankle: Posterior Tibial: [Left:Posterior Tibial: 150] [Right:1.06] Toe Nail Assessment Left: Right: Thick: No Discolored: No Deformed: No Improper Length and Hygiene: No Electronic Signature(s) AMAURI, MEDELLIN ([id number]) Signed: 02/22/2015 5:12:08 PM By: Gretta Cool, RN, BSN, Kim RN, BSN Entered By: Gretta Cool, RN, BSN, Kim on 02/22/2015 09:20:32 Show, [name] ([id number]) -------------------------------------------------------------------------------- Multi Wound Chart Details Patient Name: [name]. Date of Service: 02/22/2015 8:45 AM Medical Record Number: [id number] Patient Account Number: [id number] Date of Birth/Sex: 25-Jan-1933 (81 y.o. Female) Treating RN: Cornell Barman Primary Care Physician: Benita Stabile Other Clinician: Referring Physician: Benita Stabile Treating Physician/Extender: Frann Rider in Treatment: 0 Vital Signs Height(in): 63 Pulse(bpm): 72 Weight(lbs): 180 Blood Pressure 136/55 (mmHg): Body Mass Index(BMI): 32 Temperature(F): 98.2 Respiratory Rate 16 (breaths/min): Photos: [1:No Photos] [N/A:N/A] Wound Location: [1:Right Lower Leg - Anterior N/A] Wounding Event: [1:Surgical  Injury] [N/A:N/A] Primary Etiology: [1:Malignant Wound] [N/A:N/A] Date Acquired: [1:01/26/2015] [N/A:N/A] Weeks of Treatment: [1:0] [N/A:N/A] Wound Status: [1:Open] [N/A:N/A] Measurements L x W x D 3.2x2.8x0.4 [N/A:N/A] (cm) Area (cm) : [1:7.037] [N/A:N/A] Volume (cm) : [1:2.815] [N/A:N/A] Starting Position 1 10 (o'clock): Ending Position 1 [1:3] (o'clock): Maximum Distance 1 0.9 (cm): Undermining: [1:Yes] [N/A:N/A] Classification: [1:Full Thickness Without Exposed Support Structures] [N/A:N/A] Exudate Amount: [1:Small] [N/A:N/A] Exudate Type: [1:Serous] [N/A:N/A] Exudate Color: [1:amber] [N/A:N/A] Wound Margin: [1:Distinct, outline attached N/A] Granulation Amount: [1:Small (1-33%)] [N/A:N/A] Granulation Quality: [1:Pink, Hyper-granulation N/A] Necrotic Amount: [1:Medium (34-66%)] [N/A:N/A] Exposed Structures: [1:Fascia: No Fat: No] [N/A:N/A] Tendon: No Muscle: No Joint: No Bone: No Limited to Skin Breakdown Epithelialization: Small (1-33%) N/A N/A Periwound Skin Texture: Scarring: Yes N/A N/A Edema: No Excoriation: No Induration: No Callus: No Crepitus: No Fluctuance: No Friable: No Rash: No Periwound  Skin Moist: Yes N/A N/A Moisture: Maceration: No Dry/Scaly: No Periwound Skin Color: Erythema: Yes N/A N/A Atrophie Blanche: No Cyanosis: No Ecchymosis: No Hemosiderin Staining: No Mottled: No Pallor: No Rubor: No Erythema Location: Circumferential N/A N/A Temperature: No Abnormality N/A N/A Tenderness on Yes N/A N/A Palpation: Wound Preparation: Ulcer Cleansing: Not N/A N/A Cleansed Topical Anesthetic Applied: Other: lidocaine 4% Treatment Notes Electronic Signature(s) Signed: 02/22/2015 5:12:08 PM By: Gretta Cool, RN, BSN, Kim RN, BSN Entered By: Gretta Cool, RN, BSN, Kim on 02/22/2015 09:34:25 Gentzler, [name] ([id number]) -------------------------------------------------------------------------------- [place] Details Patient Name:  JEZABELLE, CHISOLM. Date of Service: 02/22/2015 8:45 AM Medical Record Number: [id number] Patient Account Number: [id number] Date of Birth/Sex: 22-Dec-1932 (81 y.o. Female) Treating RN: Cornell Barman Primary Care Physician: Benita Stabile Other Clinician: Referring Physician: Benita Stabile Treating Physician/Extender: Frann Rider in Treatment: 0 Active Inactive Malignancy/Atypical Etiology Nursing Diagnoses: Knowledge deficit related to disease process and management of atypical ulcer etiology Goals: Patient/caregiver will verbalize understanding of disease process and disease management of atypical ulcer etiology Date Initiated: 02/22/2015 Goal Status: Active Interventions: Assess patient and family medical history for signs and symptoms of malignancy/atypical etiology upon admission Notes: Orientation to the Wound Care Program Nursing Diagnoses: Knowledge deficit related to the wound healing center program Goals: Patient/caregiver will verbalize understanding of the [place] Program Date Initiated: 02/22/2015 Goal Status: Active Interventions: Provide education on orientation to the wound center Notes: Soft Tissue Infection Nursing Diagnoses: Impaired tissue integrity Potential for infection: soft tissue Enge, Lyrick R. ([id number]) Goals: Patient will remain free of wound infection Date Initiated: 02/22/2015 Goal Status: Active Patient's soft tissue infection will resolve Date Initiated: 02/22/2015 Goal Status: Active Interventions: Assess signs and symptoms of infection every visit Treatment Activities: Systemic antibiotics : 02/22/2015 Notes: Wound/Skin Impairment Nursing Diagnoses: Impaired tissue integrity Goals: Ulcer/skin breakdown will heal within 14 weeks Date Initiated: 02/22/2015 Goal Status: Active Interventions: Assess patient/caregiver ability to obtain necessary supplies Notes: Electronic Signature(s) Signed: 02/22/2015 5:12:08 PM By: Gretta Cool,  RN, BSN, Kim RN, BSN Entered By: Gretta Cool, RN, BSN, Kim on 02/22/2015 09:34:11 Schelling, [name] ([id number]) -------------------------------------------------------------------------------- Pain Assessment Details Patient Name: [name]. Date of Service: 02/22/2015 8:45 AM Medical Record Number: [id number] Patient Account Number: [id number] Date of Birth/Sex: [birth year] (81 y.o. Female) Treating RN: Cornell Barman Primary Care Physician: Benita Stabile Other Clinician: Referring Physician: Benita Stabile Treating Physician/Extender: Frann Rider in Treatment: 0 Active Problems Location of Pain Severity and Description of Pain Patient Has Paino No Site Locations Pain Management and Medication Current Pain Management: Electronic Signature(s) Signed: 02/22/2015 5:12:08 PM By: Gretta Cool, RN, BSN, Kim RN, BSN Entered By: Gretta Cool, RN, BSN, Kim on 02/22/2015 59:56:38 Rosasco, [name] ([id number]) -------------------------------------------------------------------------------- Patient/Caregiver Education Details Patient Name: [name]. Date of Service: 02/22/2015 8:45 AM Medical Record Number: [id number] Patient Account Number: [id number] Date of Birth/Gender: [birth year] (81 y.o. Female) Treating RN: Cornell Barman Primary Care Physician: Benita Stabile Other Clinician: Referring Physician: Benita Stabile Treating Physician/Extender: Frann Rider in Treatment: 0 Education Assessment Education Provided To: Patient Education Topics Provided Wound/Skin Impairment: Handouts: Caring for Your Ulcer, Other: wound care as prescribed Methods: Demonstration, Explain/Verbal Responses: State content correctly Electronic Signature(s) Signed: 02/22/2015 5:12:08 PM By: Gretta Cool, RN, BSN, Kim RN, BSN Entered By: Gretta Cool, RN, BSN, Kim on 02/22/2015 09:57:51 Lovern, [name] ([id number]) -------------------------------------------------------------------------------- Wound Assessment Details Patient Name: [name]. Date of Service: 02/22/2015 8:45 AM Medical Record Number: [id number] Patient Account Number: [id number] Date of Birth/Sex: Jul 30, 1932 (82  y.o. Female) Treating RN: Cornell Barman Primary Care Physician: Benita Stabile Other Clinician: Referring Physician: Benita Stabile Treating Physician/Extender: Frann Rider in Treatment: 0 Wound Status Wound Number: 1 Primary Etiology: Malignant Wound Wound Location: Right Lower Leg - Anterior Wound Status: Open Wounding Event: Surgical Injury Comorbid History: Cataracts, Osteoarthritis Date Acquired: 01/26/2015 Weeks Of Treatment: 0 Clustered Wound: No Photos Wound Measurements Length: (cm) 3.2 Width: (cm) 2.8 Depth: (cm) 0.4 Area: (cm) 7.037 Volume: (cm) 2.815 % Reduction in Area: 0% % Reduction in Volume: 0% Epithelialization: Small (1-33%) Tunneling: No Undermining: Yes Starting Position (o'clock): 10 Ending Position (o'clock): 3 Maximum Distance: (cm) 0.9 Wound Description Full Thickness Without Exposed Foul Odor Aft Classification: Support Structures MELANY, WIESMAN ([id number]) er Cleansing: No Wound Margin: Distinct, outline attached Exudate Medium Amount: Exudate Type: Serous Exudate Color: amber Wound Bed Granulation Amount: Small (1-33%) Exposed Structure Granulation Quality: Pink, Hyper-granulation Fascia Exposed: No Necrotic Amount: Medium (34-66%) Fat Layer Exposed: No Necrotic Quality: Adherent Slough Tendon Exposed: No Muscle Exposed: No Joint Exposed: No Bone Exposed: No Limited to Skin Breakdown Periwound Skin Texture Texture Color No Abnormalities Noted: No No Abnormalities Noted: No Callus: No Atrophie Blanche: No Crepitus: No Cyanosis: No Excoriation: No Ecchymosis: No Fluctuance: No Erythema: Yes Friable: No Erythema Location: Circumferential Induration: No Hemosiderin Staining: No Localized Edema: No Mottled: No Rash: No Pallor: No Scarring: Yes Rubor: No Moisture  Temperature / Pain No Abnormalities Noted: No Temperature: No Abnormality Dry / Scaly: No Tenderness on Palpation: Yes Maceration: No Moist: Yes Wound Preparation Ulcer Cleansing: Not Cleansed Topical Anesthetic Applied: Other: lidocaine 4%, Treatment Notes Wound #1 (Right, Anterior Lower Leg) 1. Cleansed with: Clean wound with Normal Saline 2. Anesthetic Topical Lidocaine 4% cream to wound bed prior to debridement 4. Dressing Applied: [employer], Gayla R. ([id number]) 5. Secondary Dressing Applied Bordered Foam Dressing 7. Secured with Patient to wear own compression stockings Electronic Signature(s) Signed: 02/22/2015 5:12:08 PM By: Gretta Cool, RN, BSN, Kim RN, BSN Entered By: Gretta Cool, RN, BSN, Kim on 02/22/2015 12:45:32 Broerman, [name] ([id number]) -------------------------------------------------------------------------------- [place] Details Patient Name: [name]. Date of Service: 02/22/2015 8:45 AM Medical Record Number: [id number] Patient Account Number: [id number] Date of Birth/Sex: 12-08-1932 (81 y.o. Female) Treating RN: Cornell Barman Primary Care Physician: Benita Stabile Other Clinician: Referring Physician: Benita Stabile Treating Physician/Extender: Frann Rider in Treatment: 0 Vital Signs Time Taken: 09:08 Temperature (F): 98.2 Height (in): 63 Pulse (bpm): 72 Source: Stated Respiratory Rate (breaths/min): 16 Weight (lbs): 180 Blood Pressure (mmHg): 136/55 Source: Stated Reference Range: 80 - 120 mg / dl Body Mass Index (BMI): 31.9 Electronic Signature(s) Signed: 02/22/2015 5:12:08 PM By: Gretta Cool, RN, BSN, Kim RN, BSN Entered By: Gretta Cool, RN, BSN, Kim on 02/22/2015 09:09:14

## 2015-03-01 ENCOUNTER — Encounter: Payer: Medicare HMO | Admitting: Surgery

## 2015-03-01 DIAGNOSIS — C44722 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, including hip: Secondary | ICD-10-CM | POA: Diagnosis not present

## 2015-03-02 NOTE — Progress Notes (Signed)
[name], [name] ([id number]) Visit Report for 03/01/2015 Chief Complaint Document Details Patient Name: [name], [name]. Date of Service: 03/01/2015 9:30 AM Medical Record Number: [id number] Patient Account Number: [id number] Date of Birth/Sex: Mar 26, 1932 (81 y.o. Female) Treating RN: Montey Hora Primary Care Physician: Benita Stabile Other Clinician: Referring Physician: Benita Stabile Treating Physician/Extender: Frann Rider in Treatment: 1 Information Obtained from: Patient Chief Complaint Patient presents to the wound care center with open non-healing surgical wound(s) which has been there on the right lower extremity for about 4 weeks Electronic Signature(s) Signed: 03/01/2015 10:16:22 AM By: Christin Fudge MD, FACS Entered By: Christin Fudge on 03/01/2015 10:16:22 [name], [name] ([id number]) -------------------------------------------------------------------------------- Debridement Details Patient Name: [name]. Date of Service: 03/01/2015 9:30 AM Medical Record Number: [id number] Patient Account Number: [id number] Date of Birth/Sex: Mar 29, 1932 (81 y.o. Female) Treating RN: Montey Hora Primary Care Physician: TATE, Sharlet Salina Other Clinician: Referring Physician: Benita Stabile Treating Physician/Extender: Frann Rider in Treatment: 1 Debridement Performed for Wound #1 Right,Anterior Lower Leg Assessment: Performed By: Physician Christin Fudge, MD Debridement: Debridement Pre-procedure Yes Verification/Time Out Taken: Start Time: 10:07 Pain Control: Lidocaine 4% Topical Solution Level: Skin/Subcutaneous Tissue Total Area Debrided (L x 3.3 (cm) x 2.7 (cm) = 8.91 (cm) W): Tissue and other Viable, Non-Viable, Fibrin/Slough, Subcutaneous material debrided: Instrument: Curette Bleeding: Minimum Hemostasis Achieved: Pressure End Time: 10:10 Procedural Pain: 0 Post Procedural Pain: 0 Response to Treatment: Procedure was tolerated well Post Debridement  Measurements of Total Wound Length: (cm) 3.3 Width: (cm) 2.7 Depth: (cm) 0.5 Volume: (cm) 3.499 Post Procedure Diagnosis Same as Pre-procedure Electronic Signature(s) Signed: 03/01/2015 10:16:11 AM By: Christin Fudge MD, FACS Signed: 03/01/2015 5:57:37 PM By: Montey Hora Entered By: Christin Fudge on 03/01/2015 10:16:11 [name], [name] ([id number]) -------------------------------------------------------------------------------- HPI Details Patient Name: [name]. Date of Service: 03/01/2015 9:30 AM Medical Record Number: [id number] Patient Account Number: [id number] Date of Birth/Sex: [birth year] (81 y.o. Female) Treating RN: Montey Hora Primary Care Physician: Benita Stabile Other Clinician: Referring Physician: Benita Stabile Treating Physician/Extender: Frann Rider in Treatment: 1 History of Present Illness Location: right lower extremity Quality: Patient reports experiencing a dull pain to affected area(s). Severity: Patient states wound are getting worse. Duration: Patient has had the wound for < 4 weeks prior to presenting for treatment Timing: Pain in wound is Intermittent (comes and goes Context: The wound occurred when the patient habits sutures removed from a dermatology excision of a squamous cell carcinoma Modifying Factors: Other treatment(s) tried include: she is on doxycycline at the present time and prior to that had Keflex Associated Signs and Symptoms: Patient reports having difficulty standing for long periods. HPI Description: 81 year old patient sent to [place] by her dermatologist Dr. Kirkland Hun for a decreased surgical wound of the right lower extremity where a excision and primary repair was done in early November. Pathology showed a squamous cell carcinoma with no residual tumor seen. Postoperatively the patient was put on antibiotics for a wound infection and initially she had Keflex given by her PCP. The patient was then switched to doxycycline  by the dermatologist for a presumed MRSA. Sutures were removed 3 weeks later and there was partial dehiscence of the surgical wound with no purulent drainage. At the present time the patient says she has minimal pain but no pus drainage or fever. The patient is a smoker and smokes a packet of cigarettes for the last 65 years 03/01/2015 -- the patient is still on doxycycline and this was only started on Saturday.  She continues to smoke about half pack of cigarettes a day and says she is working on quitting. She has an appointment to see her dermatologist this Wednesday. Electronic Signature(s) Signed: 03/01/2015 10:17:09 AM By: Christin Fudge MD, FACS Entered By: Christin Fudge on 03/01/2015 10:17:09 [name], [name] ([id number]) -------------------------------------------------------------------------------- Physical Exam Details Patient Name: [name], [name]. Date of Service: 03/01/2015 9:30 AM Medical Record Number: [id number] Patient Account Number: [id number] Date of Birth/Sex: 09/25/1932 (81 y.o. Female) Treating RN: Montey Hora Primary Care Physician: Benita Stabile Other Clinician: Referring Physician: Benita Stabile Treating Physician/Extender: Frann Rider in Treatment: 1 Constitutional . Pulse regular. Respirations normal and unlabored. Afebrile. . Eyes Nonicteric. Reactive to light. Ears, Nose, Mouth, and Throat Lips, teeth, and gums WNL.Marland Kitchen Moist mucosa without lesions . Neck supple and nontender. No palpable supraclavicular or cervical adenopathy. Normal sized without goiter. Respiratory WNL. No retractions.. Cardiovascular Pedal Pulses WNL. No clubbing, cyanosis or edema. Lymphatic No adneopathy. No adenopathy. No adenopathy. Musculoskeletal Adexa without tenderness or enlargement.. Digits and nails w/o clubbing, cyanosis, infection, petechiae, ischemia, or inflammatory conditions.. Integumentary (Hair, Skin) No suspicious lesions. No crepitus or fluctuance. No  peri-wound warmth or erythema. No masses.Marland Kitchen Psychiatric Judgement and insight Intact.. No evidence of depression, anxiety, or agitation.. Notes the wound continues to have significant amount of slough down to the subcutaneous tissue and this was sharply debrided with a curette. Electronic Signature(s) Signed: 03/01/2015 10:17:44 AM By: Christin Fudge MD, FACS Entered By: Christin Fudge on 03/01/2015 10:17:43 [name], [name] ([id number]) -------------------------------------------------------------------------------- Physician Orders Details Patient Name: [name]. Date of Service: 03/01/2015 9:30 AM Medical Record Number: [id number] Patient Account Number: [id number] Date of Birth/Sex: 08/02/1932 (81 y.o. Female) Treating RN: Montey Hora Primary Care Physician: Benita Stabile Other Clinician: Referring Physician: Benita Stabile Treating Physician/Extender: Frann Rider in Treatment: 1 Verbal / Phone Orders: Yes Clinician: Dorthy, Joanna Read Back and Verified: Yes Diagnosis Coding Wound Cleansing Wound #1 Right,Anterior Lower Leg o Clean wound with Normal Saline. Anesthetic Wound #1 Right,Anterior Lower Leg o Topical Lidocaine 4% cream applied to wound bed prior to debridement Primary Wound Dressing Wound #1 Right,Anterior Lower Leg o Santyl Ointment Secondary Dressing Wound #1 Right,Anterior Lower Leg o Boardered Foam Dressing Dressing Change Frequency Wound #1 Right,Anterior Lower Leg o Change dressing every day. Follow-up Appointments Wound #1 Right,Anterior Lower Leg o Return Appointment in 1 week. Edema Control Wound #1 Right,Anterior Lower Leg o Patient to wear own compression stockings o Elevate legs to the level of the heart and pump ankles as often as possible Additional Orders / Instructions Wound #1 Right,Anterior Lower Leg o Stop Smoking MARCELINE, NAPIERALA ([id number]) Electronic Signature(s) Signed: 03/01/2015 4:36:00 PM By: Christin Fudge MD, FACS Signed: 03/01/2015 5:57:37 PM By: Montey Hora Entered By: Montey Hora on 03/01/2015 10:11:46 Mckee, [name] ([id number]) -------------------------------------------------------------------------------- Problem List Details Patient Name: [name], [name]. Date of Service: 03/01/2015 9:30 AM Medical Record Number: [id number] Patient Account Number: [id number] Date of Birth/Sex: November 26, 1932 (81 y.o. 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 Date of Service: 03/01/2015 9:30 AM Medical Record Number: [id number] Patient Account Number: [id number] Date of Birth/Sex: October 24, 1932 (81 y.o. Female) Treating RN: Montey Hora Primary Care Physician: Benita Stabile Other Clinician: Referring Physician: Benita Stabile Treating Physician/Extender: Frann Rider in Treatment: 1 Subjective Chief Complaint Information obtained from Patient Patient presents to the wound care center with open non-healing surgical wound(s) which has been there on the right  lower extremity for about 4 weeks History of Present Illness (HPI) The following HPI elements were documented for the patient's wound: Location: right lower extremity Quality: Patient reports experiencing a dull pain to affected area(s). Severity: Patient states wound are getting worse. Duration: Patient has had the wound for < 4 weeks prior to presenting for treatment Timing: Pain in wound is Intermittent (comes and goes Context: The wound occurred when the patient habits sutures removed from a dermatology excision of a squamous cell carcinoma Modifying Factors: Other treatment(s) tried include: she is on doxycycline at the present time and prior to that had Keflex Associated Signs and Symptoms: Patient reports having difficulty standing for long periods. 81 year old patient sent to [place] by her dermatologist Dr. Kirkland Hun for a decreased surgical wound of the right lower extremity where a excision and primary repair was done in early November. Pathology showed a squamous cell carcinoma with no residual tumor seen. Postoperatively the patient was put on antibiotics for a wound infection and initially she had Keflex given by her PCP. The patient was then switched to doxycycline by the dermatologist for a presumed MRSA. Sutures were removed 3 weeks later and there was partial dehiscence of the surgical wound with no purulent drainage. At the present time the patient says she has minimal pain but no pus drainage or fever. The patient is a smoker and smokes a packet of cigarettes for the last 65 years 03/01/2015 -- the patient is still on doxycycline and this was only started on Saturday. She continues to smoke about half pack of cigarettes a day and says she is working on quitting. She has an appointment to see her dermatologist this Wednesday. Objective [name], Brianna R. ([id number]) Constitutional Pulse regular. Respirations normal and unlabored. Afebrile. Vitals Time Taken: 9:43 AM,  Height: 63 in, Weight: 180 lbs, BMI: 31.9, Temperature: 98.4 F, Pulse: 71 bpm, Respiratory Rate: 18 breaths/min, Blood Pressure: 138/61 mmHg. Eyes Nonicteric. Reactive to light. Ears, Nose, Mouth, and Throat Lips, teeth, and gums WNL.Marland Kitchen Moist mucosa without lesions . Neck supple and nontender. No palpable supraclavicular or cervical adenopathy. Normal sized without goiter. Respiratory WNL. No retractions.. Cardiovascular Pedal Pulses WNL. No clubbing, cyanosis or edema. Lymphatic No adneopathy. No adenopathy. No adenopathy. Musculoskeletal Adexa without tenderness or enlargement.. Digits and nails w/o clubbing, cyanosis, infection, petechiae, ischemia, or inflammatory conditions.Marland Kitchen Psychiatric Judgement and insight Intact.. No evidence of depression, anxiety, or agitation.. General Notes: the wound continues to have significant amount of slough down to the subcutaneous tissue and this was sharply debrided with a curette. Integumentary (Hair, Skin) No suspicious lesions. No crepitus or fluctuance. No peri-wound warmth or erythema. No masses.. Wound #1 status is Open. Original cause of wound was Surgical Injury. The wound is located on the Right,Anterior Lower Leg. The wound measures 3.3cm length x 2.7cm width x 0.5cm depth; 6.998cm^2 area and 3.499cm^3 volume. The wound is limited to  skin breakdown. There is no tunneling or undermining noted. There is a medium amount of serous drainage noted. The wound margin is distinct with the outline attached to the wound base. There is large (67-100%) pink granulation within the wound bed. There is a small (1-33%) amount of necrotic tissue within the wound bed including Adherent Slough. The periwound skin appearance exhibited: Localized Edema, Scarring, Moist, Erythema. The periwound skin appearance did not exhibit: Callus, Crepitus, Excoriation, Fluctuance, Friable, Induration, Rash, Dry/Scaly, Maceration, Atrophie Blanche, Cyanosis, Ecchymosis,  Hemosiderin Staining, Mottled, Pallor, Rubor. The surrounding wound skin color is noted with erythema which is circumferential. Periwound temperature was noted as No [name], [name] R. ([id number]) Abnormality. The periwound has tenderness on palpation. Assessment Active Problems ICD-10 C44.722 - 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, including hip T81.31XA - Disruption of external operation (surgical) wound, not elsewhere classified, initial encounter L97.212 - Non-pressure chronic ulcer of right calf with fat layer exposed F17.218 - Nicotine dependence, cigarettes, with other nicotine-induced disorders Procedures Wound #1 Wound #1 is a Malignant Wound located on the Right,Anterior Lower Leg . There was a Skin/Subcutaneous Tissue Debridement ([id number]) debridement with total area of 8.91 sq cm performed by Christin Fudge, MD. with the following instrument(s): Curette to remove Viable and Non-Viable tissue/material including Fibrin/Slough and Subcutaneous after achieving pain control using Lidocaine 4% Topical Solution. A time out was conducted prior to the start of the procedure. A Minimum amount of bleeding was controlled with Pressure. The procedure was tolerated well with a pain level of 0 throughout and a pain level of 0 following the procedure. Post Debridement Measurements: 3.3cm length x 2.7cm width x 0.5cm depth; 3.499cm^3 volume. Post procedure Diagnosis Wound #1: Same as Pre-Procedure Plan Wound Cleansing: Wound #1 Right,Anterior Lower Leg: Clean wound with Normal Saline. Anesthetic: Wound #1 Right,Anterior Lower Leg: Topical Lidocaine 4% cream applied to wound bed prior to debridement Primary Wound Dressing: Wound #1 Right,Anterior Lower Leg: Santyl Ointment Starkey, Anjelique R. ([id number]) Secondary Dressing: Wound #1 Right,Anterior Lower Leg: Boardered Foam Dressing Dressing Change Frequency: Wound #1 Right,Anterior Lower Leg: Change dressing every  day. Follow-up Appointments: Wound #1 Right,Anterior Lower Leg: Return Appointment in 1 week. Edema Control: Wound #1 Right,Anterior Lower Leg: Patient to wear own compression stockings Elevate legs to the level of the heart and pump ankles as often as possible Additional Orders / Instructions: Wound #1 Right,Anterior Lower Leg: Stop Smoking I have recommended: 1. We continue using Santyl ointment on a daily basis and cover it with a bordered foam. She can wash with soap and water prior to changing her dressing. 2. Work hardening completely giving up smoking and we have discussed this again. 3. Continue to use doxycycline as prescribed by dermatologist and we may need to continue this. 4. We have also discussed nutrition and increase protein and vitamin supplements with vitamin C and zinc 5. She is to be back on a weekly basis. Electronic Signature(s) Signed: 03/01/2015 10:25:29 AM By: Christin Fudge MD, FACS Previous Signature: 03/01/2015 10:19:10 AM Version By: Christin Fudge MD, FACS Entered By: Christin Fudge on 03/01/2015 10:25:29 Deines, [name] ([id number]) -------------------------------------------------------------------------------- SuperBill Details Patient Name: [name]. Date of Service: 03/01/2015 Medical Record Number: [id number] Patient Account Number: [id number] Date of Birth/Sex: 06/08/1932 (81 y.o. Female) Treating RN: Montey Hora Primary Care Physician: Benita Stabile Other Clinician: Referring Physician: Benita Stabile Treating Physician/Extender: Frann Rider in Treatment: 1 Diagnosis Coding ICD-10 Codes Code Description [id number] Squamous cell carcinoma of skin of right lower  limb, including hip Disruption of external operation (surgical) wound, not elsewhere classified, initial T81.31XA encounter L97.212 Non-pressure chronic ulcer of right calf with fat layer exposed F17.218 Nicotine dependence, cigarettes, with other nicotine-induced  disorders Facility Procedures CPT4: Description Modifier Quantity Code [id number] 11042 - DEB SUBQ TISSUE 20 SQ CM/< 1 ICD-10 Description Diagnosis C44.722 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, including hip T81.31XA Disruption of external operation (surgical) wound, not  elsewhere classified, initial encounter L97.212 Non-pressure chronic ulcer of right calf with fat layer exposed F17.218 Nicotine dependence, cigarettes, with other nicotine-induced disorders Physician Procedures CPT4: Description Modifier Quantity Code [id number] 11042 - WC PHYS SUBQ TISS 20 SQ CM 1 ICD-10 Description Diagnosis C44.722 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, including hip T81.31XA Disruption of external operation (surgical) wound, not  elsewhere classified, initial encounter L97.212 Non-pressure chronic ulcer of right calf with fat layer exposed F17.218 Nicotine dependence, cigarettes, with other nicotine-induced disorders Electronic Signature(s) Signed: 03/01/2015 10:19:25 AM By: Christin Fudge MD, FACS [place], [place] ([id number]) Entered By: Christin Fudge on 03/01/2015 10:19:24

## 2015-03-02 NOTE — Progress Notes (Signed)
Brianna, Solomon ([id number]) Visit Report for 03/01/2015 Arrival Information Details Patient Name: [name], [name]. Date of Service: 03/01/2015 9:30 AM Medical Record Number: [id number] Patient Account Number: [id number] Date of Birth/Sex: June 10, 1932 (81 y.o. Female) Treating RN: Montey Hora Primary Care Physician: Benita Stabile Other Clinician: Referring Physician: Benita Stabile Treating Physician/Extender: Frann Rider in Treatment: 1 Visit Information History Since Last Visit Added or deleted any medications: No Patient Arrived: Ambulatory Any new allergies or adverse reactions: No Arrival Time: 09:43 Had a fall or experienced change in No Accompanied By: dtr activities of daily living that may affect Transfer Assistance: None risk of falls: Patient Identification Verified: Yes Signs or symptoms of abuse/neglect since last No Secondary Verification Process Yes visito Completed: Hospitalized since last visit: No Patient Requires Transmission- Yes Pain Present Now: No Based Precautions: Transmission-Based Precautions: Contact MRSA Patient Has Alerts: Yes Electronic Signature(s) Signed: 03/01/2015 5:57:37 PM By: Montey Hora Entered By: Montey Hora on 03/01/2015 09:43:32 [place], [name] ([id number]) -------------------------------------------------------------------------------- Encounter Discharge Information Details Patient Name: [name]. Date of Service: 03/01/2015 9:30 AM Medical Record Number: [id number] Patient Account Number: [id number] Date of Birth/Sex: 1932-03-31 (81 y.o. Female) Treating RN: Montey Hora Primary Care Physician: Benita Stabile Other Clinician: Referring Physician: Benita Stabile Treating Physician/Extender: Frann Rider in Treatment: 1 Encounter Discharge Information Items Discharge Pain Level: 0 Discharge Condition: Stable Ambulatory Status: Ambulatory Discharge Destination: Home Transportation: Private Auto Accompanied  By: dtr Schedule Follow-up Appointment: Yes Medication Reconciliation completed and provided to Patient/Care No Aislin Onofre: Provided on Clinical Summary of Care: 03/01/2015 Form Type Recipient Paper Patient JW Electronic Signature(s) Signed: 03/01/2015 10:25:11 AM By: Ruthine Dose Entered By: Ruthine Dose on 03/01/2015 10:25:11 Gasca, [name] ([id number]) -------------------------------------------------------------------------------- Lower Extremity Assessment Details Patient Name: [name]. Date of Service: 03/01/2015 9:30 AM Medical Record Number: [id number] Patient Account Number: [id number] Date of Birth/Sex: 09-20-1932 (81 y.o. Female) Treating RN: Montey Hora Primary Care Physician: TATE, Sharlet Salina Other Clinician: Referring Physician: Benita Stabile Treating Physician/Extender: Frann Rider in Treatment: 1 Edema Assessment Assessed: [Left: No] [Right: No] E[Left: dema] [Right: :] Calf Left: Right: Point of Measurement: 33 cm From Medial Instep cm 33.4 cm Ankle Left: Right: Point of Measurement: 10 cm From Medial Instep cm 22.6 cm Vascular Assessment Pulses: Posterior Tibial Dorsalis Pedis Palpable: [Right:Yes] Extremity colors, hair growth, and conditions: Extremity Color: [Right:Normal] Hair Growth on Extremity: [Right:No] Temperature of Extremity: [Right:Warm] Capillary Refill: [Right:< 3 seconds] Electronic Signature(s) Signed: 03/01/2015 5:57:37 PM By: Montey Hora Entered By: Montey Hora on 03/01/2015 09:55:01 Ostroff, [name] ([id number]) -------------------------------------------------------------------------------- Multi Wound Chart Details Patient Name: [name]. Date of Service: 03/01/2015 9:30 AM Medical Record Number: [id number] Patient Account Number: [id number] Date of Birth/Sex: January 24, 1933 (81 y.o. 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] [N/A:N/A] Comorbid History: [1:Cataracts, Osteoarthritis N/A] Date Acquired: [1:01/26/2015] [N/A:N/A] Weeks of Treatment: [1:1] [N/A:N/A] Wound Status: [1:Open] [N/A:N/A] Measurements L x W x D 3.3x2.7x0.5 [N/A:N/A] (cm) Area (cm) : [1:6.998] [N/A:N/A] Volume (cm) : [1:3.499] [N/A:N/A] % Reduction in Area: [1:0.60%] [N/A:N/A] % Reduction in Volume: -24.30% [N/A:N/A] Classification: [1:Full Thickness Without Exposed Support Structures] [N/A:N/A] Exudate Amount: [1:Medium] [N/A:N/A] Exudate  Type: [1:Serous] [N/A:N/A] Exudate Color: [1:amber] [N/A:N/A] Wound Margin: [1:Distinct, outline attached N/A] Granulation Amount: [1:Large (67-100%)] [N/A:N/A] Granulation Quality: [1:Pink, Hyper-granulation N/A] Necrotic Amount: [1:Small (1-33%)] [N/A:N/A] Exposed Structures: [1:Fascia: No Fat: No Tendon: No Muscle: No Joint: No Bone: No] [N/A:N/A] Limited to Skin Breakdown Epithelialization: Small (1-33%) N/A N/A Periwound Skin Texture: Edema: Yes N/A N/A Scarring: Yes Excoriation: No Induration: No Callus: No Crepitus: No Fluctuance: No Friable: No Rash: No Periwound Skin Moist: Yes N/A N/A Moisture: Maceration: No Dry/Scaly: No Periwound Skin Color: Erythema: Yes N/A N/A Atrophie Blanche: No Cyanosis: No Ecchymosis: No Hemosiderin Staining: No Mottled: No Pallor: No Rubor: No Erythema Location: Circumferential N/A N/A Temperature: No Abnormality N/A N/A Tenderness on Yes N/A N/A Palpation: Wound Preparation: Ulcer Cleansing: N/A N/A Rinsed/Irrigated  with Saline Topical Anesthetic Applied: Other: lidocaine 4% Treatment Notes Electronic Signature(s) Signed: 03/01/2015 5:57:37 PM By: Montey Hora Entered By: Montey Hora on 03/01/2015 09:57:05 [place], [name] ([id number]) -------------------------------------------------------------------------------- [place] Details Patient Name: [name]. Date of Service: 03/01/2015 9:30 AM Medical Record Number: [id number] Patient Account Number: [id number] Date of Birth/Sex: 1932-08-21 (81 y.o. Female) Treating RN: Montey Hora Primary Care Physician: Benita Stabile Other Clinician: Referring Physician: Benita Stabile Treating Physician/Extender: Frann Rider in Treatment: 1 Active Inactive Malignancy/Atypical Etiology Nursing Diagnoses: Knowledge deficit related to disease process and management of atypical ulcer etiology Goals: Patient/caregiver will verbalize understanding of disease process and disease management of atypical ulcer etiology Date Initiated: 02/22/2015 Goal Status: Active Interventions: Assess patient and family medical history for signs and symptoms of malignancy/atypical etiology upon admission Notes: Orientation to the Wound Care Program Nursing Diagnoses: Knowledge deficit related to the wound healing center program Goals: Patient/caregiver will verbalize understanding of the [place] Program Date Initiated: 02/22/2015 Goal Status: Active Interventions: Provide education on orientation to the wound center Notes: Soft Tissue Infection Nursing Diagnoses: Impaired tissue integrity Potential for infection: soft tissue Alberson, Elaysia R. ([id number]) Goals: Patient will remain free of wound infection Date Initiated: 02/22/2015 Goal Status: Active Patient's soft tissue infection will resolve Date Initiated: 02/22/2015 Goal Status: Active Interventions: Assess signs and symptoms of infection every visit Treatment  Activities: Systemic antibiotics : 03/01/2015 Notes: Wound/Skin Impairment Nursing Diagnoses: Impaired tissue integrity Goals: Ulcer/skin breakdown will heal within 14 weeks Date Initiated: 02/22/2015 Goal Status: Active Interventions: Assess patient/caregiver ability to obtain necessary supplies Notes: Electronic Signature(s) Signed: 03/01/2015 5:57:37 PM By: Montey Hora Entered By: Montey Hora on 03/01/2015 09:56:51 [place], [name] ([id number]) -------------------------------------------------------------------------------- Patient/Caregiver Education Details Patient Name: [name]. Date of Service: 03/01/2015 9:30 AM Medical Record Number: [id number] Patient Account Number: [id number] Date of Birth/Gender: 1933/01/06 (81 y.o. Female) Treating RN: Montey Hora Primary Care Physician: Benita Stabile Other Clinician: Referring Physician: Benita Stabile Treating Physician/Extender: Frann Rider in Treatment: 1 Education Assessment Education Provided To: Patient and Caregiver Education Topics Provided Nutrition: Handouts: Other: nutrition for wound healing Methods: Demonstration, Explain/Verbal Responses: State content correctly Electronic Signature(s) Signed: 03/01/2015 5:57:37 PM By: Montey Hora Entered By: Montey Hora on 03/01/2015 10:12:46 [place], [name] ([id number]) -------------------------------------------------------------------------------- Wound Assessment Details Patient Name: [name]. Date of Service: 03/01/2015 9:30 AM Medical Record Number: [id number] Patient Account Number: [id number] Date of Birth/Sex: 26-Sep-1932 (81 y.o. Female) Treating RN: Montey Hora Primary Care Physician: Benita Stabile Other Clinician: Referring Physician: Benita Stabile Treating Physician/Extender: Frann Rider in Treatment: 1 Wound Status Wound Number: 1 Primary Etiology: Malignant Wound Wound Location: Right Lower Leg - Anterior Wound Status:  Open Wounding Event:  Surgical Injury Comorbid History: Cataracts, Osteoarthritis Date Acquired: 01/26/2015 Weeks Of Treatment: 1 Clustered Wound: No Photos Photo Uploaded By: Montey Hora on 03/01/2015 11:56:20 Wound Measurements Length: (cm) 3.3 Width: (cm) 2.7 Depth: (cm) 0.5 Area: (cm) 6.998 Volume: (cm) 3.499 % Reduction in Area: 0.6% % Reduction in Volume: -24.3% Epithelialization: Small (1-33%) Tunneling: No Undermining: No Wound Description Full Thickness Without Exposed Foul Odor Aft Classification: Support Structures Wound Margin: Distinct, outline attached Exudate Medium Amount: Exudate Type: Serous Exudate Color: amber er Cleansing: No Wound Bed Granulation Amount: Large (67-100%) Exposed Structure Granulation Quality: Pink, Hyper-granulation Fascia Exposed: No Necrotic Amount: Small (1-33%) Fat Layer Exposed: No [place], [place] R. ([id number]) Necrotic Quality: Adherent Slough Tendon Exposed: No Muscle Exposed: No Joint Exposed: No Bone Exposed: No Limited to Skin Breakdown Periwound Skin Texture Texture Color No Abnormalities Noted: No No Abnormalities Noted: No Callus: No Atrophie Blanche: No Crepitus: No Cyanosis: No Excoriation: No Ecchymosis: No Fluctuance: No Erythema: Yes Friable: No Erythema Location: Circumferential Induration: No Hemosiderin Staining: No Localized Edema: Yes Mottled: No Rash: No Pallor: No Scarring: Yes Rubor: No Moisture Temperature / Pain No Abnormalities Noted: No Temperature: No Abnormality Dry / Scaly: No Tenderness on Palpation: Yes Maceration: No Moist: Yes Wound Preparation Ulcer Cleansing: Rinsed/Irrigated with Saline Topical Anesthetic Applied: Other: lidocaine 4%, Treatment Notes Wound #1 (Right, Anterior Lower Leg) 1. Cleansed with: Clean wound with Normal Saline 2. Anesthetic Topical Lidocaine 4% cream to wound bed prior to debridement 4. Dressing Applied: Santyl Ointment 5.  Secondary Dressing Applied Bordered Foam Dressing Electronic Signature(s) Signed: 03/01/2015 5:57:37 PM By: Montey Hora Entered By: Montey Hora on 03/01/2015 09:53:41 [place], [name] ([id number]) -------------------------------------------------------------------------------- [place] Details Patient Name: [name]. Date of Service: 03/01/2015 9:30 AM Medical Record Number: [id number] Patient Account Number: [id number] Date of Birth/Sex: [birth year] (81 y.o. Female) Treating RN: Montey Hora Primary Care Physician: TATE, Sharlet Salina Other Clinician: Referring Physician: Benita Stabile Treating Physician/Extender: Frann Rider in Treatment: 1 Vital Signs Time Taken: 09:43 Temperature (F): 98.4 Height (in): 63 Pulse (bpm): 71 Weight (lbs): 180 Respiratory Rate (breaths/min): 18 Body Mass Index (BMI): 31.9 Blood Pressure (mmHg): 138/61 Reference Range: 80 - 120 mg / dl Electronic Signature(s) Signed: 03/01/2015 5:57:37 PM By: Montey Hora Entered By: Montey Hora on 03/01/2015 09:46:35

## 2015-03-08 ENCOUNTER — Encounter: Payer: Medicare HMO | Admitting: Surgery

## 2015-03-08 DIAGNOSIS — C44722 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, including hip: Secondary | ICD-10-CM | POA: Diagnosis not present

## 2015-03-09 NOTE — Progress Notes (Addendum)
ALLYANNA, APPLEMAN ([id number]) Visit Report for 03/08/2015 Arrival Information Details Patient Name: [name], BRAU. Date of Service: 03/08/2015 8:45 AM Medical Record Number: [id number] Patient Account Number: [id number] Date of Birth/Sex: 06/03/1932 (81 y.o. Female) Treating RN: Montey Hora Primary Care Physician: Benita Stabile Other Clinician: Referring Physician: Benita Stabile Treating Physician/Extender: Frann Rider in Treatment: 2 Visit Information History Since Last Visit Added or deleted any medications: No Patient Arrived: Ambulatory Any new allergies or adverse reactions: No Arrival Time: 08:55 Had a fall or experienced change in No Accompanied By: self activities of daily living that may affect Transfer Assistance: None risk of falls: Patient Identification Verified: Yes Signs or symptoms of abuse/neglect since last No Secondary Verification Process Yes visito Completed: Hospitalized since last visit: No Patient Requires Transmission- Yes Pain Present Now: No Based Precautions: Transmission-Based Precautions: Contact MRSA Patient Has Alerts: Yes Electronic Signature(s) Signed: 03/08/2015 5:49:02 PM By: Montey Hora Entered By: Montey Hora on 03/08/2015 08:58:41 Manganaro, [name] ([id number]) -------------------------------------------------------------------------------- Encounter Discharge Information Details Patient Name: Brianna Lia. Date of Service: 03/08/2015 8:45 AM Medical Record Number: [id number] Patient Account Number: [id number] Date of Birth/Sex: May 26, 1932 (81 y.o. Female) Treating RN: Montey Hora Primary Care Physician: Benita Stabile Other Clinician: Referring Physician: Benita Stabile Treating Physician/Extender: Frann Rider in Treatment: 2 Encounter Discharge Information Items Discharge Pain Level: 0 Discharge Condition: Stable Ambulatory Status: Ambulatory Discharge Destination: Home Transportation: Private Auto Accompanied  By: self Schedule Follow-up Appointment: Yes Medication Reconciliation completed and provided to Patient/Care No Tibor Lemmons: Provided on Clinical Summary of Care: 03/08/2015 Form Type Recipient Paper Patient JW Electronic Signature(s) Signed: 03/08/2015 2:20:56 PM By: Montey Hora Previous Signature: 03/08/2015 9:38:19 AM Version By: Ruthine Dose Entered By: Montey Hora on 03/08/2015 14:20:56 Kulaga, [name] ([id number]) -------------------------------------------------------------------------------- Lower Extremity Assessment Details Patient Name: Brianna Lia. Date of Service: 03/08/2015 8:45 AM Medical Record Number: [id number] Patient Account Number: [id number] Date of Birth/Sex: Apr 12, 1932 (81 y.o. Female) Treating RN: Montey Hora Primary Care Physician: Benita Stabile Other Clinician: Referring Physician: Benita Stabile Treating Physician/Extender: Frann Rider in Treatment: 2 Edema Assessment Assessed: [Left: No] [Right: No] Edema: [Left: Ye] [Right: s] Calf Left: Right: Point of Measurement: 33 cm From Medial Instep cm 32.8 cm Ankle Left: Right: Point of Measurement: 10 cm From Medial Instep cm 22.2 cm Vascular Assessment Pulses: Posterior Tibial Dorsalis Pedis Palpable: [Right:Yes] Extremity colors, hair growth, and conditions: Extremity Color: [Right:Normal] Hair Growth on Extremity: [Right:No] Temperature of Extremity: [Right:Warm] Capillary Refill: [Right:< 3 seconds] Electronic Signature(s) Signed: 03/08/2015 5:49:02 PM By: Montey Hora Entered By: Montey Hora on 03/08/2015 09:06:05 Desantiago, [name] ([id number]) -------------------------------------------------------------------------------- Multi Wound Chart Details Patient Name: Brianna Lia. Date of Service: 03/08/2015 8:45 AM Medical Record Number: [id number] Patient Account Number: [id number] Date of Birth/Sex: [birth year] (81 y.o. 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] [N/A:N/A] Comorbid History: [1:Cataracts, Osteoarthritis N/A] Date Acquired: [1:01/26/2015] [N/A:N/A] Weeks of Treatment: [1:2] [N/A:N/A] Wound Status: [1:Open] [N/A:N/A] Measurements L x W x D 2.8x1.9x0.4 [N/A:N/A] (cm) Area (cm) : [1:4.178] [N/A:N/A] Volume (cm) : [1:1.671] [N/A:N/A] % Reduction in Area: [1:40.60%] [N/A:N/A] % Reduction in Volume: 40.60% [N/A:N/A] Classification: [1:Full Thickness  Without Exposed Support Structures] [N/A:N/A] Exudate Amount: [1:Medium] [N/A:N/A] Exudate Type: [1:Serous] [N/A:N/A] Exudate Color: [1:amber] [N/A:N/A] Wound Margin: [1:Distinct, outline attached N/A] Granulation Amount: [1:Large (67-100%)] [N/A:N/A] Granulation Quality: [1:Pink, Hyper-granulation N/A] Necrotic Amount: [1:Small (1-33%)] [N/A:N/A] Exposed Structures: [1:Fascia: No Fat: No Tendon: No Muscle: No Joint: No Bone: No] [N/A:N/A] Limited to Skin Breakdown Epithelialization: Small (1-33%) N/A N/A Periwound Skin Texture: Edema: Yes N/A N/A Scarring: Yes Excoriation: No Induration: No Callus: No Crepitus: No Fluctuance: No Friable: No Rash: No Periwound Skin Moist: Yes N/A N/A Moisture: Maceration: No Dry/Scaly: No Periwound Skin Color: Erythema: Yes N/A N/A Atrophie Blanche: No Cyanosis: No Ecchymosis: No Hemosiderin Staining: No Mottled: No Pallor: No Rubor: No Erythema Location: Circumferential N/A N/A Temperature: No Abnormality N/A N/A Tenderness on Yes N/A  N/A Palpation: Wound Preparation: Ulcer Cleansing: N/A N/A Rinsed/Irrigated with Saline Topical Anesthetic Applied: Other: lidocaine 4% Treatment Notes Electronic Signature(s) Signed: 03/08/2015 5:49:02 PM By: Montey Hora Entered By: Montey Hora on 03/08/2015 09:19:23 Gettel, [name] ([id number]) -------------------------------------------------------------------------------- [place] Details Patient Name: Brianna Lia. Date of Service: 03/08/2015 8:45 AM Medical Record Number: [id number] Patient Account Number: [id number] Date of Birth/Sex: 11/14/1932 (81 y.o. Female) Treating RN: Montey Hora Primary Care Physician: Benita Stabile Other Clinician: Referring Physician: Benita Stabile Treating Physician/Extender: Frann Rider in Treatment: 2 Active Inactive Malignancy/Atypical Etiology Nursing Diagnoses: Knowledge deficit related to disease process and management of atypical ulcer etiology Goals: Patient/caregiver will verbalize understanding of disease process and disease management of atypical ulcer etiology Date Initiated: 02/22/2015 Goal Status: Active Interventions: Assess patient and family medical history for signs and symptoms of malignancy/atypical etiology upon admission Notes: Orientation to the Wound Care Program Nursing Diagnoses: Knowledge deficit related to the wound healing center program Goals: Patient/caregiver will verbalize understanding of the [facility] Date Initiated: 02/22/2015 Goal Status: Active Interventions: Provide education on orientation to the wound center Notes: Soft Tissue Infection Nursing Diagnoses: Impaired tissue integrity Potential for infection: soft tissue Solomon, Brianna R. ([id number]) Goals: Patient will remain free of wound infection Date Initiated: 02/22/2015 Goal Status: Active Patient's soft tissue infection will resolve Date Initiated: 02/22/2015 Goal Status:  Active Interventions: Assess signs and symptoms of infection every visit Treatment Activities: Systemic antibiotics : 03/08/2015 Notes: Wound/Skin Impairment Nursing Diagnoses: Impaired tissue integrity Goals: Ulcer/skin breakdown will heal within 14 weeks Date Initiated: 02/22/2015 Goal Status: Active Interventions: Assess patient/caregiver ability to obtain necessary supplies Notes: Electronic Signature(s) Signed: 03/08/2015 5:49:02 PM By: Montey Hora Entered By: Montey Hora on 03/08/2015 09:19:06 Willig, [name] ([id number]) -------------------------------------------------------------------------------- Patient/Caregiver Education Details Patient Name: Brianna Lia. Date of Service: 03/08/2015 8:45 AM Medical Record Number: [id number] Patient Account Number: [id number] Date of Birth/Gender: [birth year] (81 y.o. Female) Treating RN: Montey Hora Primary Care Physician: Benita Stabile Other Clinician: Referring Physician: Benita Stabile Treating Physician/Extender: Frann Rider in Treatment: 2 Education Assessment Education Provided To: Patient Education Topics Provided Wound/Skin Impairment: Handouts: Smoking and Wound Healing, Other: wound care as ordered Methods: Demonstration, Explain/Verbal Responses: State content correctly Electronic Signature(s) Signed: 03/08/2015 2:21:21 PM By: Montey Hora Entered By: Montey Hora on 03/08/2015 14:21:21 [place], [name] ([id number]) -------------------------------------------------------------------------------- Wound Assessment Details Patient Name: Brianna Lia. Date of Service: 03/08/2015 8:45 AM Medical Record Number: [id number] Patient Account Number: [id number] Date of Birth/Sex: [birth year] (81 y.o. Female) Treating RN: Montey Hora Primary Care Physician: Benita Stabile Other Clinician: Referring Physician: Benita Stabile Treating Physician/Extender: Frann Rider in Treatment: 2 Wound Status Wound  Number: 1 Primary Etiology: Malignant  Wound Wound Location: Right Lower Leg - Anterior Wound Status: Open Wounding Event: Surgical Injury Comorbid History: Cataracts, Osteoarthritis Date Acquired: 01/26/2015 Weeks Of Treatment: 2 Clustered Wound: No Photos Photo Uploaded By: Montey Hora on 03/08/2015 17:14:44 Wound Measurements Length: (cm) 2.8 Width: (cm) 1.9 Depth: (cm) 0.4 Area: (cm) 4.178 Volume: (cm) 1.671 % Reduction in Area: 40.6% % Reduction in Volume: 40.6% Epithelialization: Small (1-33%) Tunneling: No Undermining: No Wound Description Full Thickness Without Exposed Foul Odor Aft Classification: Support Structures Wound Margin: Distinct, outline attached Exudate Medium Amount: Exudate Type: Serous Exudate Color: amber er Cleansing: No Wound Bed Granulation Amount: Large (67-100%) Exposed Structure Granulation Quality: Pink, Hyper-granulation Fascia Exposed: No Necrotic Amount: Small (1-33%) Fat Layer Exposed: No Solomon, Brianna R. ([id number]) Necrotic Quality: Adherent Slough Tendon Exposed: No Muscle Exposed: No Joint Exposed: No Bone Exposed: No Limited to Skin Breakdown Periwound Skin Texture Texture Color No Abnormalities Noted: No No Abnormalities Noted: No Callus: No Atrophie Blanche: No Crepitus: No Cyanosis: No Excoriation: No Ecchymosis: No Fluctuance: No Erythema: Yes Friable: No Erythema Location: Circumferential Induration: No Hemosiderin Staining: No Localized Edema: Yes Mottled: No Rash: No Pallor: No Scarring: Yes Rubor: No Moisture Temperature / Pain No Abnormalities Noted: No Temperature: No Abnormality Dry / Scaly: No Tenderness on Palpation: Yes Maceration: No Moist: Yes Wound Preparation Ulcer Cleansing: Rinsed/Irrigated with Saline Topical Anesthetic Applied: Other: lidocaine 4%, Treatment Notes Wound #1 (Right, Anterior Lower Leg) 1. Cleansed with: Clean wound with Normal Saline 2.  Anesthetic Topical Lidocaine 4% cream to wound bed prior to debridement 3. Peri-wound Care: Skin Prep 4. Dressing Applied: Santyl Ointment 5. Secondary Dressing Applied Bordered Foam Dressing Non-Adherent pad Electronic Signature(s) Signed: 03/08/2015 5:49:02 PM By: Montey Hora Entered By: Montey Hora on 03/08/2015 09:05:38 B[place], [name] ([id number]) Panik, [name] ([id number]) -------------------------------------------------------------------------------- Vitals Details Patient Name: Brianna Lia. Date of Service: 03/08/2015 8:45 AM Medical Record Number: [id number] Patient Account Number: [id number] Date of Birth/Sex: 12-20-1932 (81 y.o. Female) Treating RN: Montey Hora Primary Care Physician: [name], [name] Other Clinician: Referring Physician: Benita Stabile Treating Physician/Extender: Frann Rider in Treatment: 2 Vital Signs Time Taken: 09:00 Temperature (F): 98.2 Height (in): 63 Pulse (bpm): 76 Weight (lbs): 180 Respiratory Rate (breaths/min): 18 Body Mass Index (BMI): 31.9 Blood Pressure (mmHg): 124/76 Reference Range: 80 - 120 mg / dl Electronic Signature(s) Signed: 03/08/2015 5:49:02 PM By: Montey Hora Entered By: Montey Hora on 03/08/2015 09:00:51

## 2015-03-10 NOTE — Progress Notes (Signed)
[name] ([id number]) Visit Report for 03/08/2015 Chief Complaint Document Details Patient Name: [name], [name]. Date of Service: 03/08/2015 8:45 AM Medical Record Number: [id number] Patient Account Number: [id number] Date of Birth/Sex: [birth year] (81 y.o. Female) Treating RN: Montey Hora Primary Care Physician: Benita Stabile Other Clinician: Referring Physician: Benita Stabile Treating Physician/Extender: Frann Rider in Treatment: 2 Information Obtained from: Patient Chief Complaint Patient presents to the wound care center with open non-healing surgical wound(s) which has been there on the right lower extremity for about 4 weeks Electronic Signature(s) Signed: 03/09/2015 8:29:49 AM By: Christin Fudge MD, FACS Previous Signature: 03/08/2015 9:25:23 AM Version By: Christin Fudge MD, FACS Entered By: Christin Fudge on 03/09/2015 08:29:49 Wieczorek, [name] ([id number]) -------------------------------------------------------------------------------- Debridement Details Patient Name: [name]. Date of Service: 03/08/2015 8:45 AM Medical Record Number: [id number] Patient Account Number: [id number] Date of Birth/Sex: [birth year] (81 y.o. Female) Treating RN: Montey Hora Primary Care Physician: TATE, Sharlet Salina Other Clinician: Referring Physician: Benita Stabile Treating Physician/Extender: Frann Rider in Treatment: 2 Debridement Performed for Wound #1 Right,Anterior Lower Leg Assessment: Performed By: Physician Christin Fudge, MD Debridement: Debridement Pre-procedure Yes Verification/Time Out Taken: Start Time: 09:20 Pain Control: Lidocaine 4% Topical Solution Level: Skin/Subcutaneous Tissue Total Area Debrided (L x 2.8 (cm) x 1.9 (cm) = 5.32 (cm) W): Tissue and other Viable, Non-Viable, Fibrin/Slough, Subcutaneous material debrided: Instrument: Curette Bleeding: Minimum Hemostasis Achieved: Pressure End Time: 09:23 Procedural Pain: 0 Post Procedural Pain:  0 Response to Treatment: Procedure was tolerated well Post Debridement Measurements of Total Wound Length: (cm) 2.8 Width: (cm) 1.9 Depth: (cm) 0.4 Volume: (cm) 1.671 Post Procedure Diagnosis Same as Pre-procedure Electronic Signature(s) Signed: 03/09/2015 8:29:42 AM By: Christin Fudge MD, FACS Signed: 03/09/2015 4:41:48 PM By: Montey Hora Previous Signature: 03/08/2015 9:25:16 AM Version By: Christin Fudge MD, FACS Previous Signature: 03/08/2015 5:49:02 PM Version By: Montey Hora Entered By: Christin Fudge on 03/09/2015 08:29:42 Stanback, [name] ([id number]) -------------------------------------------------------------------------------- HPI Details Patient Name: [name]. Date of Service: 03/08/2015 8:45 AM Medical Record Number: [id number] Patient Account Number: [id number] Date of Birth/Sex: [birth year] (81 y.o. Female) Treating RN: Montey Hora Primary Care Physician: Benita Stabile Other Clinician: Referring Physician: Benita Stabile Treating Physician/Extender: Frann Rider in Treatment: 2 History of Present Illness Location: right lower extremity Quality: Patient reports experiencing a dull pain to affected area(s). Severity: Patient states wound are getting worse. Duration: Patient has had the wound for < 4 weeks prior to presenting for treatment Timing: Pain in wound is Intermittent (comes and goes Context: The wound occurred when the patient habits sutures removed from a dermatology excision of a squamous cell carcinoma Modifying Factors: Other treatment(s) tried include: she is on doxycycline at the present time and prior to that had Keflex Associated Signs and Symptoms: Patient reports having difficulty standing for long periods. HPI Description: 81 year old patient sent to [place] by her dermatologist Dr. Kirkland Hun for a decreased surgical wound of the right lower extremity where a excision and primary repair was done in early November. Pathology showed a  squamous cell carcinoma with no residual tumor seen. Postoperatively the patient was put on antibiotics for a wound infection and initially she had Keflex given by her PCP. The patient was then switched to doxycycline by the dermatologist for a presumed MRSA. Sutures were removed 3 weeks later and there was partial dehiscence of the surgical wound with no purulent drainage. At the present time the patient says she has minimal pain but no pus drainage or fever.  The patient is a smoker and smokes a packet of cigarettes for the last 65 years 03/01/2015 -- the patient is still on doxycycline and this was only started on Saturday. She continues to smoke about half pack of cigarettes a day and says she is working on quitting. She has an appointment to see her dermatologist this Wednesday. Electronic Signature(s) Signed: 03/09/2015 8:29:57 AM By: Christin Fudge MD, FACS Entered By: Christin Fudge on 03/09/2015 08:29:57 Wardrip, [name] ([id number]) -------------------------------------------------------------------------------- Physical Exam Details Patient Name: [name]. Date of Service: 03/08/2015 8:45 AM Medical Record Number: [id number] Patient Account Number: [id number] Date of Birth/Sex: Jul 09, 1932 (81 y.o. Female) Treating RN: Montey Hora Primary Care Physician: Benita Stabile Other Clinician: Referring Physician: Benita Stabile Treating Physician/Extender: Frann Rider in Treatment: 2 Constitutional . Pulse regular. Respirations normal and unlabored. Afebrile. . Eyes Nonicteric. Reactive to light. Ears, Nose, Mouth, and Throat Lips, teeth, and gums WNL.Marland Kitchen Moist mucosa without lesions. Neck supple and nontender. No palpable supraclavicular or cervical adenopathy. Normal sized without goiter. Respiratory WNL. No retractions.. Cardiovascular Pedal Pulses WNL. No clubbing, cyanosis or edema. Lymphatic No adneopathy. No adenopathy. No adenopathy. Musculoskeletal Adexa without  tenderness or enlargement.. Digits and nails w/o clubbing, cyanosis, infection, petechiae, ischemia, or inflammatory conditions.. Integumentary (Hair, Skin) No suspicious lesions. No crepitus or fluctuance. No peri-wound warmth or erythema. No masses.Marland Kitchen Psychiatric Judgement and insight Intact.. No evidence of depression, anxiety, or agitation.. Notes there is slough in the depths of the tissue especially at the edges and this will need sharp debridement with a curette. Electronic Signature(s) Signed: 03/09/2015 8:30:24 AM By: Christin Fudge MD, FACS Entered By: Christin Fudge on 03/09/2015 08:30:23 Heideman, [name] ([id number]) -------------------------------------------------------------------------------- Physician Orders Details Patient Name: [name]. Date of Service: 03/08/2015 8:45 AM Medical Record Number: [id number] Patient Account Number: [id number] Date of Birth/Sex: [birth year] (81 y.o. Female) Treating RN: Montey Hora Primary Care Physician: Benita Stabile Other Clinician: Referring Physician: Benita Stabile Treating Physician/Extender: Frann Rider in Treatment: 2 Verbal / Phone Orders: Yes Clinician: Dorthy, Joanna Read Back and Verified: Yes Diagnosis Coding Wound Cleansing Wound #1 Right,Anterior Lower Leg o Clean wound with Normal Saline. Anesthetic Wound #1 Right,Anterior Lower Leg o Topical Lidocaine 4% cream applied to wound bed prior to debridement Primary Wound Dressing Wound #1 Right,Anterior Lower Leg o Santyl Ointment Secondary Dressing Wound #1 Right,Anterior Lower Leg o Boardered Foam Dressing o Non-adherent pad Dressing Change Frequency Wound #1 Right,Anterior Lower Leg o Change dressing every day. Follow-up Appointments Wound #1 Right,Anterior Lower Leg o Return Appointment in 1 week. Edema Control Wound #1 Right,Anterior Lower Leg o Patient to wear own compression stockings o Elevate legs to the level of the heart  and pump ankles as often as possible Additional Orders / Instructions Wound #1 Right,Anterior Lower Leg o Stop Smoking JOSLYNNE, KLATT ([id number]) Electronic Signature(s) Signed: 03/08/2015 5:49:02 PM By: Montey Hora Signed: 03/09/2015 4:08:32 PM By: Christin Fudge MD, FACS Entered By: Montey Hora on 03/08/2015 09:23:14 Pesantez, [name] ([id number]) -------------------------------------------------------------------------------- Problem List Details Patient Name: Matar, [name]. Date of Service: 03/08/2015 8:45 AM Medical Record Number: [id number] Patient Account Number: [id number] Date of Birth/Sex: Aug 07, 1932 (81 y.o. 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 Date of Service: 03/08/2015 8:45 AM Medical Record Number: [id number] Patient Account Number: [id number] Date of Birth/Sex: [birth year] (82 y.o. Female) Treating RN: Montey Hora Primary Care Physician: Benita Stabile Other  Clinician: Referring Physician: Benita Stabile Treating Physician/Extender: Frann Rider in Treatment: 2 Subjective Chief Complaint Information obtained from Patient Patient presents to the wound care center with open non-healing surgical wound(s) which has been there on the right lower extremity for about 4 weeks History of Present Illness (HPI) The following HPI elements were documented for the patient's wound: Location: right lower extremity Quality: Patient reports experiencing a dull pain to affected area(s). Severity: Patient states wound are getting worse. Duration: Patient has had the wound for < 4 weeks prior to presenting for treatment Timing: Pain in wound is Intermittent (comes and goes Context: The wound occurred when the patient habits sutures removed from a dermatology excision of a squamous cell carcinoma Modifying Factors: Other treatment(s) tried include: she is on doxycycline at the present time and prior to that had Keflex Associated Signs and Symptoms: Patient reports having difficulty standing for long periods. 81 year old patient sent to [place] by her dermatologist Dr. Kirkland Hun for a decreased surgical wound of the right lower extremity where a excision and primary repair was done in early November. Pathology showed a squamous cell carcinoma with no residual tumor seen. Postoperatively the patient was put on antibiotics for a wound infection and initially she had Keflex given by her PCP. The patient was then switched to doxycycline by the dermatologist for a presumed MRSA. Sutures were removed 3 weeks later and there was partial dehiscence of the surgical wound with no purulent drainage. At the present time the patient says she has minimal pain but no pus drainage or fever. The patient is a smoker and smokes a packet of cigarettes for the last 65 years 03/01/2015 -- the patient is still on doxycycline and this was only started on Saturday. She continues  to smoke about half pack of cigarettes a day and says she is working on quitting. She has an appointment to see her dermatologist this Wednesday. Objective Arterburn, Jenica R. ([id number]) Constitutional Pulse regular. Respirations normal and unlabored. Afebrile. Vitals Time Taken: 9:00 AM, Height: 63 in, Weight: 180 lbs, BMI: 31.9, Temperature: 98.2 F, Pulse: 76 bpm, Respiratory Rate: 18 breaths/min, Blood Pressure: 124/76 mmHg. Eyes Nonicteric. Reactive to light. Ears, Nose, Mouth, and Throat Lips, teeth, and gums WNL.Marland Kitchen Moist mucosa without lesions. Neck supple and nontender. No palpable supraclavicular or cervical adenopathy. Normal sized without goiter. Respiratory WNL. No retractions.. Cardiovascular Pedal Pulses WNL. No clubbing, cyanosis or edema. Lymphatic No adneopathy. No adenopathy. No adenopathy. Musculoskeletal Adexa without tenderness or enlargement.. Digits and nails w/o clubbing, cyanosis, infection, petechiae, ischemia, or inflammatory conditions.Marland Kitchen Psychiatric Judgement and insight Intact.. No evidence of depression, anxiety, or agitation.. General Notes: there is slough in the depths of the tissue especially at the edges and this will need sharp debridement with a curette. Integumentary (Hair, Skin) No suspicious lesions. No crepitus or fluctuance. No peri-wound warmth or erythema. No  masses.. Wound #1 status is Open. Original cause of wound was Surgical Injury. The wound is located on the Right,Anterior Lower Leg. The wound measures 2.8cm length x 1.9cm width x 0.4cm depth; 4.178cm^2 area and 1.671cm^3 volume. The wound is limited to skin breakdown. There is no tunneling or undermining noted. There is a medium amount of serous drainage noted. The wound margin is distinct with the outline attached to the wound base. There is large (67-100%) pink granulation within the wound bed. There is a small (1-33%) amount of necrotic tissue within the wound bed including  Adherent Slough. The periwound skin appearance exhibited: Localized Edema, Scarring, Moist, Erythema. The periwound skin appearance did not exhibit: Callus, Crepitus, Excoriation, Fluctuance, Friable, Induration, Rash, Dry/Scaly, Maceration, Atrophie Blanche, Cyanosis, Ecchymosis, Hemosiderin Staining, Mottled, Pallor, Rubor. The surrounding wound skin color is noted with erythema which is circumferential. Periwound temperature was noted as No Dubs, Timia R. ([id number]) Abnormality. The periwound has tenderness on palpation. Assessment Active Problems ICD-10 C44.722 - 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, including hip T81.31XA - Disruption of external operation (surgical) wound, not elsewhere classified, initial encounter L97.212 - Non-pressure chronic ulcer of right calf with fat layer exposed F17.218 - Nicotine dependence, cigarettes, with other nicotine-induced disorders Procedures Wound #1 Wound #1 is a Malignant Wound located on the Right,Anterior Lower Leg . There was a Skin/Subcutaneous Tissue Debridement ([id number]) debridement with total area of 5.32 sq cm performed by Christin Fudge, MD. with the following instrument(s): Curette to remove Viable and Non-Viable tissue/material including Fibrin/Slough and Subcutaneous after achieving pain control using Lidocaine 4% Topical Solution. A time out was conducted prior to the start of the procedure. A Minimum amount of bleeding was controlled with Pressure. The procedure was tolerated well with a pain level of 0 throughout and a pain level of 0 following the procedure. Post Debridement Measurements: 2.8cm length x 1.9cm width x 0.4cm depth; 1.671cm^3 volume. Post procedure Diagnosis Wound #1: Same as Pre-Procedure Plan Wound Cleansing: Wound #1 Right,Anterior Lower Leg: Clean wound with Normal Saline. Anesthetic: Wound #1 Right,Anterior Lower Leg: Topical Lidocaine 4% cream applied to wound bed prior to  debridement Primary Wound Dressing: Wound #1 Right,Anterior Lower Leg: Santyl Ointment Dake, Ailen R. ([id number]) Secondary Dressing: Wound #1 Right,Anterior Lower Leg: Boardered Foam Dressing Non-adherent pad Dressing Change Frequency: Wound #1 Right,Anterior Lower Leg: Change dressing every day. Follow-up Appointments: Wound #1 Right,Anterior Lower Leg: Return Appointment in 1 week. Edema Control: Wound #1 Right,Anterior Lower Leg: Patient to wear own compression stockings Elevate legs to the level of the heart and pump ankles as often as possible Additional Orders / Instructions: Wound #1 Right,Anterior Lower Leg: Stop Smoking I have recommended: 1. We continue using Santyl ointment on a daily basis and cover it with a bordered foam. She can wash with soap and water prior to changing her dressing. 2. Work hardening completely giving up smoking and we have discussed this again. 3. She is to be back on a weekly basis. Electronic Signature(s) Signed: 03/09/2015 8:31:07 AM By: Christin Fudge MD, FACS Entered By: Christin Fudge on 03/09/2015 08:31:07 Wolken, [name] ([id number]) -------------------------------------------------------------------------------- SuperBill Details Patient Name: [name]. Date of Service: 03/08/2015 Medical Record Number: [id number] Patient Account Number: [id number] Date of Birth/Sex: 21-Mar-1932 (81 y.o. Female) Treating RN: Montey Hora Primary Care Physician: Benita Stabile Other Clinician: Referring Physician: Benita Stabile Treating Physician/Extender: Frann Rider in Treatment: 2 Diagnosis Coding ICD-10 Codes Code Description [phone] Squamous cell carcinoma of skin of right  lower limb, including hip Disruption of external operation (surgical) wound, not elsewhere classified, initial T81.31XA encounter L97.212 Non-pressure chronic ulcer of right calf with fat layer exposed F17.218 Nicotine dependence, cigarettes, with other  nicotine-induced disorders Facility Procedures CPT4: Description Modifier Quantity Code [id number] 11042 - DEB SUBQ TISSUE 20 SQ CM/< 1 ICD-10 Description Diagnosis C44.722 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, including hip T81.31XA Disruption of external operation (surgical) wound, not  elsewhere classified, initial encounter L97.212 Non-pressure chronic ulcer of right calf with fat layer exposed F17.218 Nicotine dependence, cigarettes, with other nicotine-induced disorders Physician Procedures CPT4: Description Modifier Quantity Code [id number] 11042 - WC PHYS SUBQ TISS 20 SQ CM 1 ICD-10 Description Diagnosis C44.722 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, including hip T81.31XA Disruption of external operation (surgical) wound, not  elsewhere classified, initial encounter L97.212 Non-pressure chronic ulcer of right calf with fat layer exposed F17.218 Nicotine dependence, cigarettes, with other nicotine-induced disorders Electronic Signature(s) Signed: 03/09/2015 8:31:20 AM By: Christin Fudge MD, FACS Bonebrake, Marion ([id number]) Entered By: Christin Fudge on 03/09/2015 08:31:19

## 2015-03-16 ENCOUNTER — Encounter (HOSPITAL_BASED_OUTPATIENT_CLINIC_OR_DEPARTMENT_OTHER): Payer: Medicare HMO | Admitting: General Surgery

## 2015-03-16 DIAGNOSIS — L97112 Non-pressure chronic ulcer of right thigh with fat layer exposed: Secondary | ICD-10-CM | POA: Diagnosis not present

## 2015-03-16 DIAGNOSIS — C44722 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, including hip: Secondary | ICD-10-CM | POA: Diagnosis not present

## 2015-03-16 NOTE — Assessment & Plan Note (Signed)
Debrided  Non healing surgical right leg.  RX silver alginate

## 2015-03-17 NOTE — Progress Notes (Addendum)
[name], [name] ([id number]) Visit Report for 03/16/2015 Chief Complaint Document Details Patient Name: [name], [name]. Date of Service: 03/16/2015 2:15 PM Medical Record Number: [id number] Patient Account Number: [id number] Date of Birth/Sex: [birth year] (81 y.o. Female) Treating RN: Montey Hora Primary Care Physician: Benita Stabile Other Clinician: Referring Physician: Benita Stabile Treating Physician/Extender: Benjaman Pott in Treatment: 3 Information Obtained from: Patient Chief Complaint Patient presents to the wound care center with open non-healing surgical wound(s) which has been there on the right lower extremity for about 4 weeks Electronic Signature(s) Signed: 03/16/2015 2:42:21 PM By: Judene Companion MD Entered By: Judene Companion on 03/16/2015 14:42:21 [name] ([id number]) -------------------------------------------------------------------------------- Debridement Details Patient Name: [name]. Date of Service: 03/16/2015 2:15 PM Medical Record Number: [id number] Patient Account Number: [id number] Date of Birth/Sex: Oct 22, 1932 (81 y.o. Female) Treating RN: Montey Hora Primary Care Physician: TATE, Sharlet Salina Other Clinician: Referring Physician: Benita Stabile Treating Physician/Extender: Benjaman Pott in Treatment: 3 Debridement Performed for Wound #1 Right,Anterior Lower Leg Assessment: Performed By: Physician Judene Companion, MD Debridement: Debridement Pre-procedure Yes Verification/Time Out Taken: Start Time: 14:44 Pain Control: Lidocaine 4% Topical Solution Level: Skin/Subcutaneous Tissue Total Area Debrided (L x 1.7 (cm) x 2.5 (cm) = 4.25 (cm) W): Tissue and other Viable, Non-Viable, Fibrin/Slough, Subcutaneous material debrided: Instrument: Curette Bleeding: Minimum Hemostasis Achieved: Pressure End Time: 14:46 Procedural Pain: 0 Post Procedural Pain: 0 Response to Treatment: Procedure was tolerated well Post Debridement Measurements  of Total Wound Length: (cm) 1.7 Width: (cm) 2.5 Depth: (cm) 0.3 Volume: (cm) 1.001 Post Procedure Diagnosis Same as Pre-procedure Electronic Signature(s) Signed: 03/16/2015 5:41:48 PM By: Montey Hora Signed: 03/18/2015 8:37:21 AM By: Judene Companion MD Entered By: Montey Hora on 03/16/2015 14:45:19 [name] ([id number]) -------------------------------------------------------------------------------- HPI Details Patient Name: [name]. Date of Service: 03/16/2015 2:15 PM Medical Record Number: [id number] Patient Account Number: [id number] Date of Birth/Sex: 07-24-1932 (81 y.o. Female) Treating RN: Montey Hora Primary Care Physician: Benita Stabile Other Clinician: Referring Physician: Benita Stabile Treating Physician/Extender: Benjaman Pott in Treatment: 3 History of Present Illness Location: right lower extremity Quality: Patient reports experiencing a dull pain to affected area(s). Severity: Patient states wound are getting worse. Duration: Patient has had the wound for < 4 weeks prior to presenting for treatment Timing: Pain in wound is Intermittent (comes and goes Context: The wound occurred when the patient habits sutures removed from a dermatology excision of a squamous cell carcinoma Modifying Factors: Other treatment(s) tried include: she is on doxycycline at the present time and prior to that had Keflex Associated Signs and Symptoms: Patient reports having difficulty standing for long periods. HPI Description: 81 year old patient sent to [place] by her dermatologist Dr. Kirkland Hun for a decreased surgical wound of the right lower extremity where a excision and primary repair was done in early November. Pathology showed a squamous cell carcinoma with no residual tumor seen. Postoperatively the patient was put on antibiotics for a wound infection and initially she had Keflex given by her PCP. The patient was then switched to doxycycline by the  dermatologist for a presumed MRSA. Sutures were removed 3 weeks later and there was partial dehiscence of the surgical wound with no purulent drainage. At the present time the patient says she has minimal pain but no pus drainage or fever. The patient is a smoker and smokes a packet of cigarettes for the last 65 years 03/01/2015 -- the patient is still on doxycycline and this was only started on Saturday. She continues  to smoke about half pack of cigarettes a day and says she is working on quitting. She has an appointment to see her dermatologist this Wednesday. Electronic Signature(s) Signed: 03/16/2015 2:42:28 PM By: Judene Companion MD Entered By: Judene Companion on 03/16/2015 14:42:27 [name] ([id number]) -------------------------------------------------------------------------------- Physical Exam Details Patient Name: [name]. Date of Service: 03/16/2015 2:15 PM Medical Record Number: [id number] Patient Account Number: [id number] Date of Birth/Sex: [birth year] (81 y.o. Female) Treating RN: Montey Hora Primary Care Physician: Benita Stabile Other Clinician: Referring Physician: Benita Stabile Treating Physician/Extender: Benjaman Pott in Treatment: 3 Electronic Signature(s) Signed: 03/16/2015 2:42:39 PM By: Judene Companion MD Entered By: Judene Companion on 03/16/2015 14:42:39 [name] ([id number]) -------------------------------------------------------------------------------- Physician Orders Details Patient Name: [name]. Date of Service: 03/16/2015 2:15 PM Medical Record Number: [id number] Patient Account Number: [id number] Date of Birth/Sex: 1932/04/21 (81 y.o. Female) Treating RN: Montey Hora Primary Care Physician: TATE, Sharlet Salina Other Clinician: Referring Physician: Benita Stabile Treating Physician/Extender: Benjaman Pott in Treatment: 3 Verbal / Phone Orders: Yes Clinician: Dorthy, Joanna Read Back and Verified: Yes Diagnosis Coding ICD-10  Coding Code Description C44.722 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, including hip Disruption of external operation (surgical) wound, not elsewhere classified, initial T81.31XA encounter L97.212 Non-pressure chronic ulcer of right calf with fat layer exposed F17.218 Nicotine dependence, cigarettes, with other nicotine-induced disorders Wound Cleansing Wound #1 Right,Anterior Lower Leg o Clean wound with Normal Saline. Anesthetic Wound #1 Right,Anterior Lower Leg o Topical Lidocaine 4% cream applied to wound bed prior to debridement Primary Wound Dressing Wound #1 Right,Anterior Lower Leg o Prisma Ag Secondary Dressing Wound #1 Right,Anterior Lower Leg o Boardered Foam Dressing o Non-adherent pad Dressing Change Frequency Wound #1 Right,Anterior Lower Leg o Change dressing every other day. Follow-up Appointments Wound #1 Right,Anterior Lower Leg o Return Appointment in 1 week. Edema Control Ewing, KAMORAH NEVILS. ([id number]) Wound #1 Right,Anterior Lower Leg o Patient to wear own compression stockings o Elevate legs to the level of the heart and pump ankles as often as possible Additional Orders / Instructions Wound #1 Right,Anterior Lower Leg o Stop Smoking Electronic Signature(s) Signed: 03/16/2015 5:41:48 PM By: Montey Hora Signed: 03/18/2015 8:37:21 AM By: Judene Companion MD Entered By: Montey Hora on 03/16/2015 14:46:10 Feltz, [name] ([id number]) -------------------------------------------------------------------------------- Problem List Details Patient Name: [name]. Date of Service: 03/16/2015 2:15 PM Medical Record Number: [id number] Patient Account Number: [id number] Date of Birth/Sex: [birth year] (81 y.o. Female) Treating RN: Montey Hora Primary Care Physician: Benita Stabile Other Clinician: Referring Physician: Benita Stabile Treating Physician/Extender: Benjaman Pott in Treatment: 3 Active  Problems ICD-10 Encounter Code Description Active Date Diagnosis C44.722 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, 02/22/2015 Yes including hip T81.31XA Disruption of external operation (surgical) wound, not 02/22/2015 Yes elsewhere classified, initial encounter L97.212 Non-pressure chronic ulcer of right calf with fat layer 02/22/2015 Yes exposed F17.218 Nicotine dependence, cigarettes, with other nicotine- 02/22/2015 Yes induced disorders Inactive Problems Resolved Problems Electronic Signature(s) Signed: 03/16/2015 2:42:12 PM By: Judene Companion MD Entered By: Judene Companion on 03/16/2015 14:42:12 Arp, [name] ([id number]) -------------------------------------------------------------------------------- Progress Note Details Patient Name: [name]. Date of Service: 03/16/2015 2:15 PM Medical Record Number: [id number] Patient Account Number: [id number] Date of Birth/Sex: Oct 01, 1932 (81 y.o. Female) Treating RN: Montey Hora Primary Care Physician: Benita Stabile Other Clinician: Referring Physician: Benita Stabile Treating Physician/Extender: Benjaman Pott in Treatment: 3 Subjective Chief Complaint Information obtained from Patient Patient presents to the wound care  center with open non-healing surgical wound(s) which has been there on the right lower extremity for about 4 weeks History of Present Illness (HPI) The following HPI elements were documented for the patient's wound: Location: right lower extremity Quality: Patient reports experiencing a dull pain to affected area(s). Severity: Patient states wound are getting worse. Duration: Patient has had the wound for < 4 weeks prior to presenting for treatment Timing: Pain in wound is Intermittent (comes and goes Context: The wound occurred when the patient habits sutures removed from a dermatology excision of a squamous cell carcinoma Modifying Factors: Other treatment(s) tried include: she is on doxycycline  at the present time and prior to that had Keflex Associated Signs and Symptoms: Patient reports having difficulty standing for long periods. 81 year old patient sent to [place] by her dermatologist Dr. Kirkland Hun for a decreased surgical wound of the right lower extremity where a excision and primary repair was done in early November. Pathology showed a squamous cell carcinoma with no residual tumor seen. Postoperatively the patient was put on antibiotics for a wound infection and initially she had Keflex given by her PCP. The patient was then switched to doxycycline by the dermatologist for a presumed MRSA. Sutures were removed 3 weeks later and there was partial dehiscence of the surgical wound with no purulent drainage. At the present time the patient says she has minimal pain but no pus drainage or fever. The patient is a smoker and smokes a packet of cigarettes for the last 65 years 03/01/2015 -- the patient is still on doxycycline and this was only started on Saturday. She continues to smoke about half pack of cigarettes a day and says she is working on quitting. She has an appointment to see her dermatologist this Wednesday. Objective Rencher, Marquelle R. ([id number]) Constitutional Vitals Time Taken: 2:33 PM, Height: 63 in, Weight: 180 lbs, BMI: 31.9, Temperature: 98.3 F, Pulse: 78 bpm, Respiratory Rate: 18 breaths/min, Blood Pressure: 134/58 mmHg. Integumentary (Hair, Skin) Wound #1 status is Open. Original cause of wound was Surgical Injury. The wound is located on the Right,Anterior Lower Leg. The wound measures 1.7cm length x 2.5cm width x 0.3cm depth; 3.338cm^2 area and 1.001cm^3 volume. The wound is limited to skin breakdown. There is no tunneling or undermining noted. There is a medium amount of serous drainage noted. The wound margin is distinct with the outline attached to the wound base. There is large (67-100%) pink granulation within the wound bed. There is a small (1-33%)  amount of necrotic tissue within the wound bed including Adherent Slough. The periwound skin appearance exhibited: Localized Edema, Scarring, Moist, Erythema. The periwound skin appearance did not exhibit: Callus, Crepitus, Excoriation, Fluctuance, Friable, Induration, Rash, Dry/Scaly, Maceration, Atrophie Blanche, Cyanosis, Ecchymosis, Hemosiderin Staining, Mottled, Pallor, Rubor. The surrounding wound skin color is noted with erythema which is circumferential. Periwound temperature was noted as No Abnormality. The periwound has tenderness on palpation. Assessment Active Problems ICD-10 C44.722 - 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, including hip T81.31XA - Disruption of external operation (surgical) wound, not elsewhere classified, initial encounter L97.212 - Non-pressure chronic ulcer of right calf with fat layer exposed F17.218 - Nicotine dependence, cigarettes, with other nicotine-induced disorders Plan non healing surgical wound anterior right leg. About 2.5 cm. Starting silver collagen dressings Electronic Signature(s) Signed: 03/16/2015 2:49:57 PM By: Judene Companion MD Entered By: Judene Companion on 03/16/2015 14:49:56 Kye, [name] ([id number]) -------------------------------------------------------------------------------- SuperBill Details Patient Name: [name]. Date of Service: 03/16/2015 Medical Record  Number: [id number] Patient Account Number: [id number] Date of Birth/Sex: Jul 26, 1932 (82 y.o. Female) Treating RN: Montey Hora Primary Care Physician: Benita Stabile Other Clinician: Referring Physician: Benita Stabile Treating Physician/Extender: Benjaman Pott in Treatment: 3 Diagnosis Coding ICD-10 Codes Code Description C44.722 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, including hip Disruption of external operation (surgical) wound, not elsewhere classified, initial T81.31XA encounter L97.212 Non-pressure chronic ulcer of right calf  with fat layer exposed F17.218 Nicotine dependence, cigarettes, with other nicotine-induced disorders Facility Procedures CPT4: Description Modifier Quantity Code [id number] 11042 - DEB SUBQ TISSUE 20 SQ CM/< 1 ICD-10 Description Diagnosis T81.31XA Disruption of external operation (surgical) wound, not elsewhere classified, initial encounter Physician Procedures CPT4: Description Modifier Quantity Code [id number] 99213 - WC PHYS LEVEL 3 - EST PT 1 ICD-10 Description Diagnosis T81.31XA Disruption of external operation (surgical) wound, not elsewhere classified, initial encounter CPT4: 9030092 11042 - WC PHYS SUBQ TISS 20 SQ CM 1 ICD-10 Description Diagnosis T81.31XA Disruption of external operation (surgical) wound, not elsewhere classified, initial encounter Electronic Signature(s) Signed: 03/16/2015 2:51:09 PM By: Judene Companion MD Gabrielle, [place] ([id number]) Entered By: Judene Companion on 03/16/2015 14:51:09

## 2015-03-17 NOTE — Progress Notes (Signed)
DEENA, SHAUB ([id number]) Visit Report for 03/16/2015 Arrival Information Details Patient Name: [name], [name]. Date of Service: 03/16/2015 2:15 PM Medical Record Number: [id number] Patient Account Number: [id number] Date of Birth/Sex: [birth year] (81 y.o. Female) Treating RN: Montey Hora Primary Care Physician: Benita Stabile Other Clinician: Referring Physician: Benita Stabile Treating Physician/Extender: Benjaman Pott in Treatment: 3 Visit Information History Since Last Visit Added or deleted any medications: No Patient Arrived: Ambulatory Any new allergies or adverse reactions: No Arrival Time: 14:33 Had a fall or experienced change in No Accompanied By: self activities of daily living that may affect Transfer Assistance: None risk of falls: Patient Identification Verified: Yes Signs or symptoms of abuse/neglect since last No Secondary Verification Process Yes visito Completed: Hospitalized since last visit: No Patient Requires Transmission- Yes Pain Present Now: No Based Precautions: Transmission-Based Precautions: Contact MRSA Patient Has Alerts: Yes Electronic Signature(s) Signed: 03/16/2015 5:41:48 PM By: Montey Hora Entered By: Montey Hora on 03/16/2015 14:33:31 Becraft, [name] ([id number]) -------------------------------------------------------------------------------- Encounter Discharge Information Details Patient Name: [name]. Date of Service: 03/16/2015 2:15 PM Medical Record Number: [id number] Patient Account Number: [id number] Date of Birth/Sex: 12/01/1932 (81 y.o. Female) Treating RN: Montey Hora Primary Care Physician: Benita Stabile Other Clinician: Referring Physician: Benita Stabile Treating Physician/Extender: Benjaman Pott in Treatment: 3 Encounter Discharge Information Items Discharge Pain Level: 0 Discharge Condition: Stable Ambulatory Status: Ambulatory Discharge Destination:  Home Private Transportation: Auto Accompanied By: self Schedule Follow-up Appointment: Yes Medication Reconciliation completed and No provided to Patient/Care Annamay Laymon: Clinical Summary of Care: Electronic Signature(s) Signed: 03/16/2015 3:38:29 PM By: Montey Hora Previous Signature: 03/16/2015 2:51:35 PM Version By: Judene Companion MD Entered By: Montey Hora on 03/16/2015 15:38:29 Misch, [name] ([id number]) -------------------------------------------------------------------------------- Lower Extremity Assessment Details Patient Name: [name]. Date of Service: 03/16/2015 2:15 PM Medical Record Number: [id number] Patient Account Number: [id number] Date of Birth/Sex: Aug 27, 1932 (81 y.o. Female) Treating RN: Montey Hora Primary Care Physician: Benita Stabile Other Clinician: Referring Physician: Benita Stabile Treating Physician/Extender: Benjaman Pott in Treatment: 3 Edema Assessment Assessed: [Left: No] [Right: No] Edema: [Left: Ye] [Right: s] Calf Left: Right: Point of Measurement: 33 cm From Medial Instep cm 33 cm Ankle Left: Right: Point of Measurement: 10 cm From Medial Instep cm 22.5 cm Vascular Assessment Pulses: Posterior Tibial Dorsalis Pedis Palpable: [Right:Yes] Extremity colors, hair growth, and conditions: Extremity Color: [Right:Normal] Hair Growth on Extremity: [Right:No] Temperature of Extremity: [Right:Warm] Capillary Refill: [Right:< 3 seconds] Electronic Signature(s) Signed: 03/16/2015 5:41:48 PM By: Montey Hora Entered By: Montey Hora on 03/16/2015 14:38:26 Didonato, [name] ([id number]) -------------------------------------------------------------------------------- Multi Wound Chart Details Patient Name: [name]. Date of Service: 03/16/2015 2:15 PM Medical Record Number: [id number] Patient Account Number: [id number] Date of Birth/Sex: 1932/07/02 (81 y.o. Female) Treating RN: Montey Hora Primary Care Physician: Benita Stabile Other Clinician: Referring Physician: Benita Stabile Treating Physician/Extender: Benjaman Pott in Treatment: 3 Vital Signs Height(in): 63 Pulse(bpm): 78 Weight(lbs): 180 Blood Pressure 134/58 (mmHg): Body Mass Index(BMI): 32 Temperature(F): 98.3 Respiratory Rate 18 (breaths/min): Photos: [1:No Photos] [N/A:N/A] Wound Location: [1:Right Lower Leg - Anterior N/A] Wounding Event: [1:Surgical Injury] [N/A:N/A] Primary Etiology: [1:Malignant Wound] [N/A:N/A] Comorbid History: [1:Cataracts, Osteoarthritis N/A] Date Acquired: [1:01/26/2015] [N/A:N/A] Weeks of Treatment: [1:3] [N/A:N/A] Wound Status: [1:Open] [N/A:N/A] Measurements L x W x D 1.7x2.5x0.3 [N/A:N/A] (cm) Area (cm) : [1:3.338] [N/A:N/A] Volume (cm) : [1:1.001] [N/A:N/A] % Reduction in Area: [1:52.60%] [N/A:N/A] % Reduction in Volume: 64.40% [N/A:N/A] Classification: [1:Full Thickness Without Exposed Support Structures] [N/A:N/A] Exudate Amount: [1:Medium] [  N/A:N/A] Exudate Type: [1:Serous] [N/A:N/A] Exudate Color: [1:amber] [N/A:N/A] Wound Margin: [1:Distinct, outline attached N/A] Granulation Amount: [1:Large (67-100%)] [N/A:N/A] Granulation Quality: [1:Pink, Hyper-granulation N/A] Necrotic Amount: [1:Small (1-33%)] [N/A:N/A] Exposed Structures: [1:Fascia: No Fat: No Tendon: No Muscle: No Joint: No Bone: No] [N/A:N/A] Limited to Skin Breakdown Epithelialization: Small (1-33%) N/A N/A Periwound Skin Texture: Edema: Yes N/A N/A Scarring: Yes Excoriation: No Induration: No Callus: No Crepitus: No Fluctuance: No Friable: No Rash: No Periwound Skin Moist: Yes N/A N/A Moisture: Maceration: No Dry/Scaly: No Periwound Skin Color: Erythema: Yes N/A N/A Atrophie Blanche: No Cyanosis: No Ecchymosis: No Hemosiderin Staining: No Mottled: No Pallor: No Rubor: No Erythema Location: Circumferential N/A N/A Temperature: No Abnormality N/A N/A Tenderness on Yes N/A N/A Palpation: Wound  Preparation: Ulcer Cleansing: N/A N/A Rinsed/Irrigated with Saline Topical Anesthetic Applied: Other: lidocaine 4% Treatment Notes Electronic Signature(s) Signed: 03/16/2015 5:41:48 PM By: Montey Hora Entered By: Montey Hora on 03/16/2015 14:39:13 Messman, [name] ([id number]) -------------------------------------------------------------------------------- [place] Details Patient Name: [name]. Date of Service: 03/16/2015 2:15 PM Medical Record Number: [id number] Patient Account Number: [id number] Date of Birth/Sex: 1932-05-13 (81 y.o. Female) Treating RN: Montey Hora Primary Care Physician: Benita Stabile Other Clinician: Referring Physician: Benita Stabile Treating Physician/Extender: Benjaman Pott in Treatment: 3 Active Inactive Malignancy/Atypical Etiology Nursing Diagnoses: Knowledge deficit related to disease process and management of atypical ulcer etiology Goals: Patient/caregiver will verbalize understanding of disease process and disease management of atypical ulcer etiology Date Initiated: 02/22/2015 Goal Status: Active Interventions: Assess patient and family medical history for signs and symptoms of malignancy/atypical etiology upon admission Notes: Orientation to the Wound Care Program Nursing Diagnoses: Knowledge deficit related to the wound healing center program Goals: Patient/caregiver will verbalize understanding of the [facility] Date Initiated: 02/22/2015 Goal Status: Active Interventions: Provide education on orientation to the wound center Notes: Soft Tissue Infection Nursing Diagnoses: Impaired tissue integrity Potential for infection: soft tissue Knabe, Chloeanne R. ([id number]) Goals: Patient will remain free of wound infection Date Initiated: 02/22/2015 Goal Status: Active Patient's soft tissue infection will resolve Date Initiated: 02/22/2015 Goal Status: Active Interventions: Assess signs  and symptoms of infection every visit Treatment Activities: Systemic antibiotics : 03/16/2015 Notes: Wound/Skin Impairment Nursing Diagnoses: Impaired tissue integrity Goals: Ulcer/skin breakdown will heal within 14 weeks Date Initiated: 02/22/2015 Goal Status: Active Interventions: Assess patient/caregiver ability to obtain necessary supplies Notes: Electronic Signature(s) Signed: 03/16/2015 5:41:48 PM By: Montey Hora Entered By: Montey Hora on 03/16/2015 14:39:07 Griffing, [name] ([id number]) -------------------------------------------------------------------------------- Patient/Caregiver Education Details Patient Name: [name]. Date of Service: 03/16/2015 2:15 PM Medical Record Number: [id number] Patient Account Number: [id number] Date of Birth/Gender: [birth year] (81 y.o. Female) Treating RN: Montey Hora Primary Care Physician: Benita Stabile Other Clinician: Referring Physician: Benita Stabile Treating Physician/Extender: Benjaman Pott in Treatment: 3 Education Assessment Education Provided To: Patient Education Topics Provided Wound/Skin Impairment: Handouts: Other: wound care as ordered Methods: Demonstration, Explain/Verbal Responses: State content correctly Electronic Signature(s) Signed: 03/16/2015 3:38:51 PM By: Montey Hora Previous Signature: 03/16/2015 2:51:50 PM Version By: Judene Companion MD Entered By: Montey Hora on 03/16/2015 15:38:51 Spoon, [name] ([id number]) -------------------------------------------------------------------------------- Wound Assessment Details Patient Name: [name]. Date of Service: 03/16/2015 2:15 PM Medical Record Number: [id number] Patient Account Number: [id number] Date of Birth/Sex: [birth year] (81 y.o. Female) Treating RN: Montey Hora Primary Care Physician: Benita Stabile Other Clinician: Referring Physician: Benita Stabile Treating Physician/Extender: Benjaman Pott in Treatment: 3 Wound  Status Wound Number: 1 Primary Etiology: Malignant Wound Wound  Location: Right Lower Leg - Anterior Wound Status: Open Wounding Event: Surgical Injury Comorbid History: Cataracts, Osteoarthritis Date Acquired: 01/26/2015 Weeks Of Treatment: 3 Clustered Wound: No Photos Photo Uploaded By: Montey Hora on 03/16/2015 15:36:52 Wound Measurements Length: (cm) 1.7 Width: (cm) 2.5 Depth: (cm) 0.3 Area: (cm) 3.338 Volume: (cm) 1.001 % Reduction in Area: 52.6% % Reduction in Volume: 64.4% Epithelialization: Small (1-33%) Tunneling: No Undermining: No Wound Description Full Thickness Without Exposed Foul Odor Aft Classification: Support Structures Wound Margin: Distinct, outline attached Exudate Medium Amount: Exudate Type: Serous Exudate Color: amber er Cleansing: No Wound Bed Granulation Amount: Large (67-100%) Exposed Structure Granulation Quality: Pink, Hyper-granulation Fascia Exposed: No Necrotic Amount: Small (1-33%) Fat Layer Exposed: No Nappi, Tyneshia R. ([id number]) Necrotic Quality: Adherent Slough Tendon Exposed: No Muscle Exposed: No Joint Exposed: No Bone Exposed: No Limited to Skin Breakdown Periwound Skin Texture Texture Color No Abnormalities Noted: No No Abnormalities Noted: No Callus: No Atrophie Blanche: No Crepitus: No Cyanosis: No Excoriation: No Ecchymosis: No Fluctuance: No Erythema: Yes Friable: No Erythema Location: Circumferential Induration: No Hemosiderin Staining: No Localized Edema: Yes Mottled: No Rash: No Pallor: No Scarring: Yes Rubor: No Moisture Temperature / Pain No Abnormalities Noted: No Temperature: No Abnormality Dry / Scaly: No Tenderness on Palpation: Yes Maceration: No Moist: Yes Wound Preparation Ulcer Cleansing: Rinsed/Irrigated with Saline Topical Anesthetic Applied: Other: lidocaine 4%, Treatment Notes Wound #1 (Right, Anterior Lower Leg) 1. Cleansed with: Clean wound with Normal Saline 2.  Anesthetic Topical Lidocaine 4% cream to wound bed prior to debridement 3. Peri-wound Care: Skin Prep 4. Dressing Applied: Prisma Ag 5. Secondary Dressing Applied Bordered Foam Dressing Non-Adherent pad Electronic Signature(s) Signed: 03/16/2015 5:41:48 PM By: Montey Hora Entered By: Montey Hora on 03/16/2015 14:38:03 Shoe, SATOMI BUDA ([id number]) Single, [name] ([id number]) -------------------------------------------------------------------------------- Vitals Details Patient Name: [name]. Date of Service: 03/16/2015 2:15 PM Medical Record Number: [id number] Patient Account Number: [id number] Date of Birth/Sex: 1932/04/20 (80 y.o. Female) Treating RN: Montey Hora Primary Care Physician: TATE, Sharlet Salina Other Clinician: Referring Physician: Benita Stabile Treating Physician/Extender: Benjaman Pott in Treatment: 3 Vital Signs Time Taken: 14:33 Temperature (F): 98.3 Height (in): 63 Pulse (bpm): 78 Weight (lbs): 180 Respiratory Rate (breaths/min): 18 Body Mass Index (BMI): 31.9 Blood Pressure (mmHg): 134/58 Reference Range: 80 - 120 mg / dl Electronic Signature(s) Signed: 03/16/2015 5:41:48 PM By: Montey Hora Entered By: Montey Hora on 03/16/2015 14:34:35

## 2015-03-25 ENCOUNTER — Encounter: Payer: Medicare HMO | Attending: Surgery | Admitting: Surgery

## 2015-03-25 DIAGNOSIS — C4492 Squamous cell carcinoma of skin, unspecified: Secondary | ICD-10-CM | POA: Diagnosis not present

## 2015-03-25 DIAGNOSIS — L97212 Non-pressure chronic ulcer of right calf with fat layer exposed: Secondary | ICD-10-CM | POA: Insufficient documentation

## 2015-03-25 DIAGNOSIS — T8131XA Disruption of external operation (surgical) wound, not elsewhere classified, initial encounter: Secondary | ICD-10-CM | POA: Insufficient documentation

## 2015-03-25 DIAGNOSIS — T8189XA Other complications of procedures, not elsewhere classified, initial encounter: Secondary | ICD-10-CM | POA: Insufficient documentation

## 2015-03-25 DIAGNOSIS — F17218 Nicotine dependence, cigarettes, with other nicotine-induced disorders: Secondary | ICD-10-CM | POA: Insufficient documentation

## 2015-03-26 NOTE — Progress Notes (Signed)
SIMA, LINDENBERGER ([id number]) Visit Report for 03/25/2015 Arrival Information Details Patient Name: [name], [name]. Date of Service: 03/25/2015 1:30 PM Medical Record Number: [id number] Patient Account Number: [id number] Date of Birth/Sex: 09-Oct-1932 (82 y.o. Female) Treating RN: Ahmed Prima Primary Care Physician: TATE, [facility] Other Clinician: Referring Physician: Benita Stabile Treating Physician/Extender: Frann Rider in Treatment: 4 Visit Information History Since Last Visit All ordered tests and consults were completed: No Patient Arrived: Ambulatory Added or deleted any medications: No Arrival Time: 14:06 Any new allergies or adverse reactions: No Accompanied By: self Had a fall or experienced change in No Transfer Assistance: None activities of daily living that may affect Patient Identification Verified: Yes risk of falls: Secondary Verification Process Yes Signs or symptoms of abuse/neglect since last No Completed: visito Patient Requires Transmission- Yes Hospitalized since last visit: No Based Precautions: Pain Present Now: No Transmission-Based Precautions: Contact MRSA Patient Has Alerts: Yes Electronic Signature(s) Signed: 03/25/2015 5:24:53 PM By: Alric Quan Entered By: Alric Quan on 03/25/2015 14:06:58 Shisler, [name] ([id number]) -------------------------------------------------------------------------------- Encounter Discharge Information Details Patient Name: [name]. Date of Service: 03/25/2015 1:30 PM Medical Record Number: [id number] Patient Account Number: [id number] Date of Birth/Sex: October 14, 1932 (82 y.o. Female) Treating RN: Ahmed Prima Primary Care Physician: TATE, [facility] Other Clinician: Referring Physician: Benita Stabile Treating Physician/Extender: Frann Rider in Treatment: 4 Encounter Discharge Information Items Discharge Pain Level: 0 Discharge Condition: Stable Ambulatory Status: Ambulatory Discharge  Destination: Home Private Transportation: Auto Accompanied By: self Schedule Follow-up Appointment: Yes Medication Reconciliation completed and Yes provided to Patient/Care Farren Nelles: Clinical Summary of Care: Electronic Signature(s) Signed: 03/25/2015 5:24:53 PM By: Alric Quan Entered By: Alric Quan on 03/25/2015 14:42:32 Minjares, [name] ([id number]) -------------------------------------------------------------------------------- Lower Extremity Assessment Details Patient Name: [name]. Date of Service: 03/25/2015 1:30 PM Medical Record Number: [id number] Patient Account Number: [id number] Date of Birth/Sex: October 25, 1932 (82 y.o. Female) Treating RN: Ahmed Prima Primary Care Physician: TATE, [facility] Other Clinician: Referring Physician: Benita Stabile Treating Physician/Extender: Frann Rider in Treatment: 4 Edema Assessment Assessed: [Left: No] [Right: No] E[Left: dema] [Right: :] Calf Left: Right: Point of Measurement: cm From Medial Instep cm 35 cm Ankle Left: Right: Point of Measurement: cm From Medial Instep cm 21.5 cm Vascular Assessment Pulses: Posterior Tibial Dorsalis Pedis Palpable: [Right:Yes] Extremity colors, hair growth, and conditions: Extremity Color: [Right:Normal] Hair Growth on Extremity: [Right:No] Temperature of Extremity: [Right:Warm] Capillary Refill: [Right:< 3 seconds] Toe Nail Assessment Left: Right: Thick: No Discolored: No Deformed: No Improper Length and Hygiene: No Electronic Signature(s) Signed: 03/25/2015 5:24:53 PM By: Alric Quan Entered By: Alric Quan on 03/25/2015 14:11:56 Campau, [name] ([id number]) -------------------------------------------------------------------------------- Multi Wound Chart Details Patient Name: [name]. Date of Service: 03/25/2015 1:30 PM Medical Record Number: [id number] Patient Account Number: [id number] Date of Birth/Sex: [birth year] (82 y.o. 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] [N/A:N/A] Comorbid History: [1:Cataracts, Osteoarthritis N/A] Date Acquired: [1:01/26/2015] [N/A:N/A] Weeks of Treatment: [1:4] [N/A:N/A] Wound Status: [1:Open] [N/A:N/A] Measurements L x W x D 3x1x0.2 [N/A:N/A] (cm) Area (cm) : [1:2.356] [N/A:N/A] Volume (cm) : [1:0.471] [N/A:N/A] % Reduction in Area: [1:66.50%] [N/A:N/A] % Reduction in Volume: 83.30% [N/A:N/A]  Classification: [1:Full Thickness Without Exposed Support Structures] [N/A:N/A] Exudate Amount: [1:Medium] [N/A:N/A] Exudate Type: [1:Serous] [N/A:N/A] Exudate Color: [1:amber] [N/A:N/A] Wound Margin: [1:Distinct, outline attached N/A] Granulation Amount: [1:Large (67-100%)] [N/A:N/A] Granulation Quality: [1:Pink, Hyper-granulation N/A] Necrotic Amount: [1:Small (1-33%)] [N/A:N/A] Exposed Structures: [1:Fascia: No Fat: No Tendon: No Muscle: No Joint: No Bone: No] [N/A:N/A] Limited to Skin Breakdown Epithelialization: Small (1-33%) N/A N/A Periwound Skin Texture: Edema: Yes N/A N/A Scarring: Yes Excoriation: No Induration: No Callus: No Crepitus: No Fluctuance: No Friable: No Rash: No Periwound Skin Moist: Yes N/A N/A Moisture: Maceration: No Dry/Scaly: No Periwound Skin Color: Erythema: Yes N/A N/A Atrophie Blanche: No Cyanosis: No Ecchymosis: No Hemosiderin Staining: No Mottled: No Pallor: No Rubor: No Erythema Location: Circumferential N/A N/A Temperature: No Abnormality N/A N/A Tenderness  on Yes N/A N/A Palpation: Wound Preparation: Ulcer Cleansing: N/A N/A Rinsed/Irrigated with Saline Topical Anesthetic Applied: Other: lidocaine 4% Treatment Notes Electronic Signature(s) Signed: 03/25/2015 5:24:53 PM By: Alric Quan Entered By: Alric Quan on 03/25/2015 14:18:32 Leclaire, [name] ([id number]) -------------------------------------------------------------------------------- [place] Details Patient Name: [name]. Date of Service: 03/25/2015 1:30 PM Medical Record Number: [id number] Patient Account Number: [id number] Date of Birth/Sex: 11-07-1932 (82 y.o. Female) Treating RN: Ahmed Prima Primary Care Physician: TATE, [facility] Other Clinician: Referring Physician: Benita Stabile Treating Physician/Extender: Frann Rider in Treatment: 4 Active Inactive Malignancy/Atypical Etiology Nursing Diagnoses: Knowledge deficit related to disease process and management of atypical ulcer etiology Goals: Patient/caregiver will verbalize understanding of disease process and disease management of atypical ulcer etiology Date Initiated: 02/22/2015 Goal Status: Active Interventions: Assess patient and family medical history for signs and symptoms of malignancy/atypical etiology upon admission Notes: Orientation to the Wound Care Program Nursing Diagnoses: Knowledge deficit related to the wound healing center program Goals: Patient/caregiver will verbalize understanding of the [facility] Date Initiated: 02/22/2015 Goal Status: Active Interventions: Provide education on orientation to the wound center Notes: Soft Tissue Infection Nursing Diagnoses: Impaired tissue integrity Potential for infection: soft tissue Jentsch, Kona R. ([id number]) Goals: Patient will remain free of wound infection Date Initiated: 02/22/2015 Goal Status: Active Patient's soft tissue infection will resolve Date Initiated: 02/22/2015 Goal Status:  Active Interventions: Assess signs and symptoms of infection every visit Treatment Activities: Systemic antibiotics : 03/25/2015 Notes: Wound/Skin Impairment Nursing Diagnoses: Impaired tissue integrity Goals: Ulcer/skin breakdown will heal within 14 weeks Date Initiated: 02/22/2015 Goal Status: Active Interventions: Assess patient/caregiver ability to obtain necessary supplies Notes: Electronic Signature(s) Signed: 03/25/2015 5:24:53 PM By: Alric Quan Entered By: Alric Quan on 03/25/2015 14:17:35 Macdonnell, [name] ([id number]) -------------------------------------------------------------------------------- Pain Assessment Details Patient Name: [name]. Date of Service: 03/25/2015 1:30 PM Medical Record Number: [id number] Patient Account Number: [id number] Date of Birth/Sex: 1932/07/02 (82 y.o. Female) Treating RN: Ahmed Prima Primary Care Physician: Benita Stabile Other Clinician: Referring Physician: Benita Stabile Treating Physician/Extender: Frann Rider in Treatment: 4 Active Problems Location of Pain Severity and Description of Pain Patient Has Paino No Site Locations Pain Management and Medication Current Pain Management: Electronic Signature(s) Signed: 03/25/2015 5:24:53 PM By: Alric Quan Entered By: Alric Quan on 03/25/2015 14:07:10 Svehla, [name] ([id number]) -------------------------------------------------------------------------------- Patient/Caregiver Education Details Patient Name: [name]. Date of Service: 03/25/2015 1:30 PM Medical Record Number: [id number] Patient Account Number: [id number] Date of Birth/Gender: 03/13/1933 (82 y.o. Female) Treating RN: Ahmed Prima Primary Care Physician: Benita Stabile Other Clinician: Referring Physician: Benita Stabile Treating Physician/Extender: Frann Rider in Treatment: 4 Education Assessment Education Provided To: Patient Education Topics Provided Wound/Skin  Impairment: Handouts: Other: change drfessing as directed Methods: Demonstration, Explain/Verbal Responses: State content correctly Electronic Signature(s) Signed: 03/25/2015 5:24:53 PM By: Alric Quan Entered By: Alric Quan on 03/25/2015 14:42:55 Hilaire, [name] ([id number]) -------------------------------------------------------------------------------- Wound Assessment Details Patient Name: Lopiccolo, [name]. Date of Service: 03/25/2015 1:30 PM Medical Record Number: [id number] Patient Account Number: [id number] Date of Birth/Sex: Jul 30, 1932 (82 y.o. Female) Treating RN: Ahmed Prima Primary Care Physician: TATE, [facility] Other Clinician: Referring Physician: Benita Stabile Treating Physician/Extender: Frann Rider in Treatment: 4 Wound Status Wound Number: 1 Primary Etiology: Malignant Wound Wound Location: Right Lower Leg - Anterior Wound Status: Open Wounding Event: Surgical Injury Comorbid History: Cataracts, Osteoarthritis Date Acquired: 01/26/2015 Weeks Of Treatment: 4 Clustered Wound: No Photos Photo Uploaded By: Alric Quan on 03/25/2015 15:30:45 Wound Measurements Length: (cm) 3 Width: (cm) 1 Depth: (cm) 0.2 Area: (cm) 2.356 Volume: (cm) 0.471 % Reduction in Area: 66.5% % Reduction in Volume: 83.3% Epithelialization: Small (1-33%) Tunneling: No Undermining: No Wound Description Full Thickness Without Exposed Foul Odor Aft Classification: Support Structures Wound Margin: Distinct, outline attached Exudate Medium Amount: Exudate Type: Serous Exudate Color: amber er Cleansing: No Wound Bed Granulation Amount: Large (67-100%) Exposed Structure Granulation Quality: Pink, Hyper-granulation Fascia Exposed: No Necrotic Amount: Small (1-33%) Fat Layer Exposed: No Hankin, Windi R. ([id number]) Necrotic Quality: Adherent Slough Tendon Exposed: No Muscle Exposed: No Joint Exposed: No Bone Exposed: No Limited to Skin Breakdown Periwound  Skin Texture Texture Color No Abnormalities Noted: No No Abnormalities Noted: No Callus: No Atrophie Blanche: No Crepitus: No Cyanosis: No Excoriation: No Ecchymosis: No Fluctuance: No Erythema: Yes Friable: No Erythema Location: Circumferential Induration: No Hemosiderin Staining: No Localized Edema: Yes Mottled: No Rash: No Pallor: No Scarring: Yes Rubor: No Moisture Temperature / Pain No Abnormalities Noted: No Temperature: No Abnormality Dry / Scaly: No Tenderness on Palpation: Yes Maceration: No Moist: Yes Wound Preparation Ulcer Cleansing: Rinsed/Irrigated with Saline Topical Anesthetic Applied: Other: lidocaine 4%, Treatment Notes Wound #1 (Right, Anterior Lower Leg) 1. Cleansed with: Clean wound with Normal Saline 3. Peri-wound Care: Skin Prep 4. Dressing Applied: Prisma Ag 5. Secondary Dressing Applied Bordered Foam Dressing Notes telfa Electronic Signature(s) Signed: 03/25/2015 5:24:53 PM By: Alric Quan Entered By: Alric Quan on 03/25/2015 14:17:07 Cando, [name] ([id number]) -------------------------------------------------------------------------------- [place] Details Patient Name: [name]. Date of Service: 03/25/2015 1:30 PM Medical Record Number: [id number] Patient Account Number: [id number] Date of Birth/Sex: 1932-07-15 (82 y.o. Female) Treating RN: Carolyne Fiscal, Debi Primary Care Physician: TATE, [facility] Other Clinician: Referring Physician: Benita Stabile Treating Physician/Extender: Frann Rider in Treatment: 4 Vital Signs Time Taken: 14:07 Temperature (F): 97.8 Height (in): 63 Pulse (bpm): 77 Weight (lbs): 180 Respiratory Rate (breaths/min): 18 Body Mass Index (BMI): 31.9 Blood Pressure (mmHg): 144/67 Reference Range: 80 - 120 mg / dl Electronic Signature(s) Signed: 03/25/2015 5:24:53 PM By: Alric Quan Entered By: Alric Quan on 03/25/2015 14:09:03

## 2015-03-27 NOTE — Progress Notes (Signed)
Brianna, Solomon ([id number]) Visit Report for 03/25/2015 Chief Complaint Document Details Patient Name: [name], [name]. Date of Service: 03/25/2015 1:30 PM Medical Record Number: [id number] Patient Account Number: [id number] Date of Birth/Sex: 09-May-1932 (82 y.o. Female) Treating RN: Cornell Barman Primary Care Physician: Benita Stabile Other Clinician: Referring Physician: Benita Stabile Treating Physician/Extender: Frann Rider in Treatment: 4 Information Obtained from: Patient Chief Complaint Patient presents to the wound care center with open non-healing surgical wound(s) which has been there on the right lower extremity for about 4 weeks Electronic Signature(s) Signed: 03/25/2015 2:39:08 PM By: Christin Fudge MD, FACS Entered By: Christin Fudge on 03/25/2015 14:39:08 [name], [name] ([id number]) -------------------------------------------------------------------------------- Debridement Details Patient Name: [name]. Date of Service: 03/25/2015 1:30 PM Medical Record Number: [id number] Patient Account Number: [id number] Date of Birth/Sex: 31-Mar-1932 (82 y.o. Female) Treating RN: Cornell Barman Primary Care Physician: Benita Stabile Other Clinician: Referring Physician: Benita Stabile Treating Physician/Extender: Frann Rider in Treatment: 4 Debridement Performed for Wound #1 Right,Anterior Lower Leg Assessment: Performed By: Physician Christin Fudge, MD Debridement: Debridement Pre-procedure Yes Verification/Time Out Taken: Start Time: 14:29 Pain Control: Other : lidocaine Level: Skin/Subcutaneous Tissue Total Area Debrided (L x 3 (cm) x 1 (cm) = 3 (cm) W): Tissue and other Viable, Non-Viable, Exudate, Fibrin/Slough, Subcutaneous material debrided: Instrument: Forceps Bleeding: Large Hemostasis Achieved: Silver Nitrate End Time: 14:33 Procedural Pain: 0 Post Procedural Pain: 0 Response to Treatment: Procedure was tolerated well Post Debridement Measurements of Total  Wound Length: (cm) 3 Width: (cm) 1 Depth: (cm) 0.3 Volume: (cm) 0.707 Post Procedure Diagnosis Same as Pre-procedure Electronic Signature(s) Signed: 03/25/2015 2:39:00 PM By: Christin Fudge MD, FACS Signed: 03/25/2015 3:55:13 PM By: Gretta Cool RN, BSN, Kim RN, BSN Entered By: Christin Fudge on 03/25/2015 14:39:00 [name] ([id number]) -------------------------------------------------------------------------------- HPI Details Patient Name: [name]. Date of Service: 03/25/2015 1:30 PM Medical Record Number: [id number] Patient Account Number: [id number] Date of Birth/Sex: September 25, 1932 (82 y.o. Female) Treating RN: Cornell Barman Primary Care Physician: Benita Stabile Other Clinician: Referring Physician: Benita Stabile Treating Physician/Extender: Frann Rider in Treatment: 4 History of Present Illness Location: right lower extremity Quality: Patient reports experiencing a dull pain to affected area(s). Severity: Patient states wound are getting worse. Duration: Patient has had the wound for < 4 weeks prior to presenting for treatment Timing: Pain in wound is Intermittent (comes and goes Context: The wound occurred when the patient habits sutures removed from a dermatology excision of a squamous cell carcinoma Modifying Factors: Other treatment(s) tried include: she is on doxycycline at the present time and prior to that had Keflex Associated Signs and Symptoms: Patient reports having difficulty standing for long periods. HPI Description: 82 year old patient sent to [place] by her dermatologist Dr. Kirkland Hun for a decreased surgical wound of the right lower extremity where a excision and primary repair was done in early November. Pathology showed a squamous cell carcinoma with no residual tumor seen. Postoperatively the patient was put on antibiotics for a wound infection and initially she had Keflex given by her PCP. The patient was then switched to doxycycline by the dermatologist for  a presumed MRSA. Sutures were removed 3 weeks later and there was partial dehiscence of the surgical wound with no purulent drainage. At the present time the patient says she has minimal pain but no pus drainage or fever. The patient is a smoker and smokes a packet of cigarettes for the last 65 years 03/01/2015 -- the patient is still on doxycycline and this  was only started on Saturday. She continues to smoke about half pack of cigarettes a day and says she is working on quitting. She has an appointment to see her dermatologist this Wednesday. 03/25/2015 -- she is off antibiotics now and she feels she's been doing much better. She continues to smoke though and has been working on quitting. Electronic Signature(s) Signed: 03/25/2015 2:39:43 PM By: Christin Fudge MD, FACS Entered By: Christin Fudge on 03/25/2015 14:39:42 [name] ([id number]) -------------------------------------------------------------------------------- Physical Exam Details Patient Name: Brianna, Solomon. Date of Service: 03/25/2015 1:30 PM Medical Record Number: [id number] Patient Account Number: [id number] Date of Birth/Sex: Dec 14, 1932 (82 y.o. Female) Treating RN: Cornell Barman Primary Care Physician: Benita Stabile Other Clinician: Referring Physician: Benita Stabile Treating Physician/Extender: Frann Rider in Treatment: 4 Constitutional . Pulse regular. Respirations normal and unlabored. Afebrile. . Eyes Nonicteric. Reactive to light. Ears, Nose, Mouth, and Throat Lips, teeth, and gums WNL.Marland Kitchen Moist mucosa without lesions. Neck supple and nontender. No palpable supraclavicular or cervical adenopathy. Normal sized without goiter. Respiratory WNL. No retractions.. Cardiovascular Pedal Pulses WNL. No clubbing, cyanosis or edema. Lymphatic No adneopathy. No adenopathy. No adenopathy. Musculoskeletal Adexa without tenderness or enlargement.. Digits and nails w/o clubbing, cyanosis, infection, petechiae, ischemia,  or inflammatory conditions.. Integumentary (Hair, Skin) No suspicious lesions. No crepitus or fluctuance. No peri-wound warmth or erythema. No masses.Marland Kitchen Psychiatric Judgement and insight Intact.. No evidence of depression, anxiety, or agitation.. Notes the wound has some sloughed in the depths and there is some hyper granulation tissue at the sides and this will need sharp debridement with a forcep and gauze and will also require some silver nitrate to control the hypergranulation tissue. Electronic Signature(s) Signed: 03/25/2015 2:40:57 PM By: Christin Fudge MD, FACS Previous Signature: 03/25/2015 2:40:35 PM Version By: Christin Fudge MD, FACS Entered By: Christin Fudge on 03/25/2015 14:40:56 [name], [name] ([id number]) -------------------------------------------------------------------------------- Physician Orders Details Patient Name: [name]. Date of Service: 03/25/2015 1:30 PM Medical Record Number: [id number] Patient Account Number: [id number] Date of Birth/Sex: 09-18-1932 (82 y.o. Female) Treating RN: Carolyne Fiscal, Debi Primary Care Physician: [facility], [facility] Other Clinician: Referring Physician: Benita Stabile Treating Physician/Extender: Frann Rider in Treatment: 4 Verbal / Phone Orders: Yes Clinician: Pinkerton, Debi Read Back and Verified: Yes Diagnosis Coding Wound Cleansing Wound #1 Right,Anterior Lower Leg o Clean wound with Normal Saline. Anesthetic Wound #1 Right,Anterior Lower Leg o Topical Lidocaine 4% cream applied to wound bed prior to debridement Primary Wound Dressing Wound #1 Right,Anterior Lower Leg o Prisma Ag Secondary Dressing Wound #1 Right,Anterior Lower Leg o Boardered Foam Dressing o Non-adherent pad Dressing Change Frequency Wound #1 Right,Anterior Lower Leg o Change dressing every other day. Follow-up Appointments Wound #1 Right,Anterior Lower Leg o Return Appointment in 1 week. Edema Control Wound #1 Right,Anterior Lower  Leg o Patient to wear own compression stockings o Elevate legs to the level of the heart and pump ankles as often as possible Additional Orders / Instructions Wound #1 Right,Anterior Lower Leg o Stop Smoking RAMANI, [name] ([id number]) Electronic Signature(s) Signed: 03/25/2015 5:24:53 PM By: Alric Quan Signed: 03/26/2015 5:05:49 PM By: Christin Fudge MD, FACS Entered By: Alric Quan on 03/25/2015 14:33:56 [name], [name] ([id number]) -------------------------------------------------------------------------------- Problem List Details Patient Name: Gibbon, [name]. Date of Service: 03/25/2015 1:30 PM Medical Record Number: [id number] Patient Account Number: [id number] Date of Birth/Sex: Sep 23, 1932 (82 y.o. 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 Date of Service: 03/25/2015 1:30 PM Medical Record Number: [id number] Patient Account Number: [id number] Date of Birth/Sex: [birth year] (82 y.o. Female) Treating RN: Cornell Barman Primary Care Physician: Benita Stabile Other Clinician: Referring Physician: Benita Stabile Treating Physician/Extender: Frann Rider in Treatment: 4 Subjective Chief Complaint Information obtained from Patient Patient presents to the wound care center with open non-healing surgical wound(s) which has been there on the right lower extremity for about 4 weeks History of Present Illness (HPI) The following HPI elements were documented for the patient's wound: Location: right lower extremity Quality: Patient reports experiencing a dull pain to affected area(s). Severity: Patient states wound are getting worse. Duration: Patient has had the wound for < 4 weeks prior to presenting for treatment Timing: Pain in wound is Intermittent (comes and goes Context: The wound occurred when the patient habits sutures removed from a dermatology excision of a squamous cell carcinoma Modifying Factors: Other treatment(s) tried include: she is on doxycycline at the present time and prior to that had Keflex Associated Signs and Symptoms: Patient reports having difficulty standing for long periods. 82 year old patient sent to [place] by her dermatologist Dr. Kirkland Hun for a decreased surgical wound of the right lower extremity where a excision and primary repair was done in early November. Pathology showed a squamous cell carcinoma with no residual tumor seen. Postoperatively the patient was put on antibiotics for a wound infection and initially she had Keflex given by her PCP. The patient was then switched to doxycycline by the dermatologist for a presumed MRSA. Sutures were removed 3 weeks later and there was partial dehiscence of the surgical wound with no purulent drainage. At the present time the patient says she has minimal pain but no pus drainage or fever. The patient is a smoker and smokes a packet of cigarettes for the last 65 years 03/01/2015 -- the patient is still on doxycycline and this was only started on Saturday. She  continues to smoke about half pack of cigarettes a day and says she is working on quitting. She has an appointment to see her dermatologist this Wednesday. 03/25/2015 -- she is off antibiotics now and she feels she's been doing much better. She continues to smoke though and has been working on quitting. [name], Brianna R. ([id number]) Objective Constitutional Pulse regular. Respirations normal and unlabored. Afebrile. Vitals Time Taken: 2:07 PM, Height: 63 in, Weight: 180 lbs, BMI: 31.9, Temperature: 97.8 F, Pulse: 77 bpm, Respiratory Rate: 18 breaths/min, Blood Pressure: 144/67 mmHg. Eyes Nonicteric. Reactive to light. Ears, Nose, Mouth, and Throat Lips, teeth, and gums WNL.Marland Kitchen Moist mucosa without lesions. Neck supple and nontender. No palpable supraclavicular or cervical adenopathy. Normal sized without goiter. Respiratory WNL. No retractions.. Cardiovascular Pedal Pulses WNL. No clubbing, cyanosis or edema. Lymphatic No adneopathy. No adenopathy. No adenopathy. Musculoskeletal Adexa without tenderness or enlargement.. Digits and nails w/o clubbing, cyanosis, infection, petechiae, ischemia, or inflammatory conditions.Marland Kitchen Psychiatric Judgement and insight Intact.Marland Kitchen No  evidence of depression, anxiety, or agitation.. General Notes: the wound has some sloughed in the depths and there is some hyper granulation tissue at the sides and this will need sharp debridement with a forcep and gauze and will also require some silver nitrate to control the hypergranulation tissue. Integumentary (Hair, Skin) No suspicious lesions. No crepitus or fluctuance. No peri-wound warmth or erythema. No masses.. Wound #1 status is Open. Original cause of wound was Surgical Injury. The wound is located on the Right,Anterior Lower Leg. The wound measures 3cm length x 1cm width x 0.2cm depth; 2.356cm^2 area and 0.471cm^3 volume. The wound is limited to skin breakdown. There is no tunneling or undermining noted.  There is a medium amount of serous drainage noted. The wound margin is distinct with the outline attached to the wound base. There is large (67-100%) pink granulation within the wound bed. There is a small (1-33%) amount of necrotic tissue within the wound bed including Adherent Slough. The periwound skin appearance exhibited: Localized Edema, Scarring, Moist, Erythema. The periwound skin appearance [name], Brianna R. ([id number]) did not exhibit: Callus, Crepitus, Excoriation, Fluctuance, Friable, Induration, Rash, Dry/Scaly, Maceration, Atrophie Blanche, Cyanosis, Ecchymosis, Hemosiderin Staining, Mottled, Pallor, Rubor. The surrounding wound skin color is noted with erythema which is circumferential. Periwound temperature was noted as No Abnormality. The periwound has tenderness on palpation. Assessment Active Problems ICD-10 C44.722 - 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, including hip T81.31XA - Disruption of external operation (surgical) wound, not elsewhere classified, initial encounter L97.212 - Non-pressure chronic ulcer of right calf with fat layer exposed F17.218 - Nicotine dependence, cigarettes, with other nicotine-induced disorders Procedures Wound #1 Wound #1 is a Malignant Wound located on the Right,Anterior Lower Leg . There was a Skin/Subcutaneous Tissue Debridement ([id number]) debridement with total area of 3 sq cm performed by Christin Fudge, MD. with the following instrument(s): Forceps to remove Viable and Non-Viable tissue/material including Exudate, Fibrin/Slough, and Subcutaneous after achieving pain control using Other (lidocaine). A time out was conducted prior to the start of the procedure. A Large amount of bleeding was controlled with Silver Nitrate. The procedure was tolerated well with a pain level of 0 throughout and a pain level of 0 following the procedure. Post Debridement Measurements: 3cm length x 1cm width x 0.3cm depth; 0.707cm^3  volume. Post procedure Diagnosis Wound #1: Same as Pre-Procedure Plan Wound Cleansing: Wound #1 Right,Anterior Lower Leg: Clean wound with Normal Saline. Anesthetic: Wound #1 Right,Anterior Lower Leg: Topical Lidocaine 4% cream applied to wound bed prior to debridement Primary Wound Dressing: [name], [name] ([id number]) Wound #1 Right,Anterior Lower Leg: Prisma Ag Secondary Dressing: Wound #1 Right,Anterior Lower Leg: Boardered Foam Dressing Non-adherent pad Dressing Change Frequency: Wound #1 Right,Anterior Lower Leg: Change dressing every other day. Follow-up Appointments: Wound #1 Right,Anterior Lower Leg: Return Appointment in 1 week. Edema Control: Wound #1 Right,Anterior Lower Leg: Patient to wear own compression stockings Elevate legs to the level of the heart and pump ankles as often as possible Additional Orders / Instructions: Wound #1 Right,Anterior Lower Leg: Stop Smoking I have recommended applying Prisma Ag and a bordered foam and continue with elevation and exercise. We've also discussed quitting smoking and she says she is going to try and be compliant. Electronic Signature(s) Signed: 03/25/2015 2:41:28 PM By: Christin Fudge MD, FACS Entered By: Christin Fudge on 03/25/2015 14:41:27 [name], [name] ([id number]) -------------------------------------------------------------------------------- SuperBill Details Patient Name: [name]. Date of Service: 03/25/2015 Medical Record Number: [id number] Patient Account Number: [id number] Date  of Birth/Sex: Aug 05, 1932 (82 y.o. Female) Treating RN: Cornell Barman Primary Care Physician: Benita Stabile Other Clinician: Referring Physician: Benita Stabile Treating Physician/Extender: Frann Rider in Treatment: 4 Diagnosis Coding ICD-10 Codes Code Description [phone] 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, including hip Disruption of external operation (surgical) wound, not elsewhere classified,  initial T81.31XA encounter L97.212 Non-pressure chronic ulcer of right calf with fat layer exposed F17.218 Nicotine dependence, cigarettes, with other nicotine-induced disorders Facility Procedures CPT4: Description Modifier Quantity Code [id number] 11042 - DEB SUBQ TISSUE 20 SQ CM/< 1 ICD-10 Description Diagnosis C44.722 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, including hip T81.31XA Disruption of external operation (surgical) wound, not  elsewhere classified, initial encounter L97.212 Non-pressure chronic ulcer of right calf with fat layer exposed F17.218 Nicotine dependence, cigarettes, with other nicotine-induced disorders Physician Procedures CPT4: Description Modifier Quantity Code [id number] 11042 - WC PHYS SUBQ TISS 20 SQ CM 1 ICD-10 Description Diagnosis C44.722 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, including hip T81.31XA Disruption of external operation (surgical) wound, not  elsewhere classified, initial encounter L97.212 Non-pressure chronic ulcer of right calf with fat layer exposed F17.218 Nicotine dependence, cigarettes, with other nicotine-induced disorders Electronic Signature(s) Signed: 03/25/2015 2:41:42 PM By: Christin Fudge MD, FACS [name], [name] ([id number]) Entered By: Christin Fudge on 03/25/2015 14:41:41

## 2015-04-01 ENCOUNTER — Encounter: Payer: Medicare HMO | Admitting: Surgery

## 2015-04-01 DIAGNOSIS — T8131XA Disruption of external operation (surgical) wound, not elsewhere classified, initial encounter: Secondary | ICD-10-CM | POA: Diagnosis not present

## 2015-04-02 NOTE — Progress Notes (Signed)
KAMYA, WATLING ([id number]) Visit Report for 04/01/2015 Chief Complaint Document Details Patient Name: [name], [name]. Date of Service: 04/01/2015 2:45 PM Medical Record Number: [id number] Patient Account Number: [id number] Date of Birth/Sex: 1932/05/18 (82 y.o. Female) Treating RN: Macarthur Critchley Primary Care Physician: Benita Stabile Other Clinician: Referring Physician: Benita Stabile Treating Physician/Extender: Frann Rider in Treatment: 5 Information Obtained from: Patient Chief Complaint Patient presents to the wound care center with open non-healing surgical wound(s) which has been there on the right lower extremity for about 4 weeks Electronic Signature(s) Signed: 04/01/2015 3:11:18 PM By: Christin Fudge MD, FACS Entered By: Christin Fudge on 04/01/2015 15:11:17 Kunzler, [name] ([id number]) -------------------------------------------------------------------------------- Debridement Details Patient Name: [name]. Date of Service: 04/01/2015 2:45 PM Medical Record Number: [id number] Patient Account Number: [id number] Date of Birth/Sex: [birth year] (82 y.o. Female) Treating RN: Macarthur Critchley Primary Care Physician: Benita Stabile Other Clinician: Referring Physician: Benita Stabile Treating Physician/Extender: Frann Rider in Treatment: 5 Debridement Performed for Wound #1 Right,Anterior Lower Leg Assessment: Performed By: Physician Christin Fudge, MD Debridement: Open Wound/Selective Debridement Selective Description: Pre-procedure No Verification/Time Out Taken: Start Time: 15:06 Pain Control: Lidocaine 4% Topical Solution Level: Non-Viable Tissue Total Area Debrided (L x 1.2 (cm) x 0.6 (cm) = 0.72 (cm) W): Tissue and other Non-Viable, Fibrin/Slough, Subcutaneous material debrided: Instrument: Forceps Bleeding: Minimum Hemostasis Achieved: Silver Nitrate End Time: 15:09 Procedural Pain: 0 Post Procedural Pain: 0 Response to Treatment: Procedure was  tolerated well Post Debridement Measurements of Total Wound Length: (cm) 1.2 Width: (cm) 0.6 Depth: (cm) 0.1 Volume: (cm) 0.057 Post Procedure Diagnosis Same as Pre-procedure Electronic Signature(s) Signed: 04/01/2015 3:11:12 PM By: Christin Fudge MD, FACS Signed: 04/01/2015 4:39:39 PM By: Rebecca Eaton RN, Sendra Entered By: Christin Fudge on 04/01/2015 15:11:11 Turvey, [name] ([id number]) -------------------------------------------------------------------------------- HPI Details Patient Name: [name]. Date of Service: 04/01/2015 2:45 PM Medical Record Number: [id number] Patient Account Number: [id number] Date of Birth/Sex: Jun 26, 1932 (82 y.o. Female) Treating RN: Macarthur Critchley Primary Care Physician: Benita Stabile Other Clinician: Referring Physician: Benita Stabile Treating Physician/Extender: Frann Rider in Treatment: 5 History of Present Illness Location: right lower extremity Quality: Patient reports experiencing a dull pain to affected area(s). Severity: Patient states wound are getting worse. Duration: Patient has had the wound for < 4 weeks prior to presenting for treatment Timing: Pain in wound is Intermittent (comes and goes Context: The wound occurred when the patient habits sutures removed from a dermatology excision of a squamous cell carcinoma Modifying Factors: Other treatment(s) tried include: she is on doxycycline at the present time and prior to that had Keflex Associated Signs and Symptoms: Patient reports having difficulty standing for long periods. HPI Description: 82 year old patient sent to [place] by her dermatologist Dr. Kirkland Hun for a decreased surgical wound of the right lower extremity where a excision and primary repair was done in early November. Pathology showed a squamous cell carcinoma with no residual tumor seen. Postoperatively the patient was put on antibiotics for a wound infection and initially she had Keflex given by her PCP. The  patient was then switched to doxycycline by the dermatologist for a presumed MRSA. Sutures were removed 3 weeks later and there was partial dehiscence of the surgical wound with no purulent drainage. At the present time the patient says she has minimal pain but no pus drainage or fever. The patient is a smoker and smokes a packet of cigarettes for the last 65 years 03/01/2015 -- the patient is still on doxycycline and this  was only started on Saturday. She continues to smoke about half pack of cigarettes a day and says she is working on quitting. She has an appointment to see her dermatologist this Wednesday. 03/25/2015 -- she is off antibiotics now and she feels she's been doing much better. She continues to smoke though and has been working on quitting. Electronic Signature(s) Signed: 04/01/2015 3:11:23 PM By: Christin Fudge MD, FACS Entered By: Christin Fudge on 04/01/2015 15:11:23 Lucero, [name] ([id number]) -------------------------------------------------------------------------------- Physical Exam Details Patient Name: [name]. Date of Service: 04/01/2015 2:45 PM Medical Record Number: [id number] Patient Account Number: [id number] Date of Birth/Sex: 23-Nov-1932 (82 y.o. Female) Treating RN: Macarthur Critchley Primary Care Physician: Benita Stabile Other Clinician: Referring Physician: Benita Stabile Treating Physician/Extender: Frann Rider in Treatment: 5 Constitutional . Pulse regular. Respirations normal and unlabored. Afebrile. . Eyes Nonicteric. Reactive to light. Ears, Nose, Mouth, and Throat Lips, teeth, and gums WNL.Marland Kitchen Moist mucosa without lesions. Neck supple and nontender. No palpable supraclavicular or cervical adenopathy. Normal sized without goiter. Respiratory WNL. No retractions.. Cardiovascular Pedal Pulses WNL. No clubbing, cyanosis or edema. Lymphatic No adneopathy. No adenopathy. No adenopathy. Musculoskeletal Adexa without tenderness or enlargement..  Digits and nails w/o clubbing, cyanosis, infection, petechiae, ischemia, or inflammatory conditions.. Integumentary (Hair, Skin) No suspicious lesions. No crepitus or fluctuance. No peri-wound warmth or erythema. No masses.Marland Kitchen Psychiatric Judgement and insight Intact.. No evidence of depression, anxiety, or agitation.. Notes the wound has minimal slough in the depths and I have sharply removed this with a forcep and moist gauze. We also have used some silver nitrate to control the bleeding and the hyper granulation tissue. Electronic Signature(s) Signed: 04/01/2015 3:11:59 PM By: Christin Fudge MD, FACS Entered By: Christin Fudge on 04/01/2015 15:11:59 Vanderhoof, [name] ([id number]) -------------------------------------------------------------------------------- Physician Orders Details Patient Name: [name]. Date of Service: 04/01/2015 2:45 PM Medical Record Number: [id number] Patient Account Number: [id number] Date of Birth/Sex: 1932-11-18 (82 y.o. Female) Treating RN: Baruch Gouty, RN, BSN, Velva Harman Primary Care Physician: Benita Stabile Other Clinician: Referring Physician: Benita Stabile Treating Physician/Extender: Frann Rider in Treatment: 5 Verbal / Phone Orders: Yes Clinician: Afful, RN, BSN, Rita Read Back and Verified: Yes Diagnosis Coding Wound Cleansing Wound #1 Right,Anterior Lower Leg o Clean wound with Normal Saline. Anesthetic Wound #1 Right,Anterior Lower Leg o Topical Lidocaine 4% cream applied to wound bed prior to debridement Primary Wound Dressing Wound #1 Right,Anterior Lower Leg o Prisma Ag Secondary Dressing Wound #1 Right,Anterior Lower Leg o Boardered Foam Dressing Dressing Change Frequency Wound #1 Right,Anterior Lower Leg o Change dressing every other day. Follow-up Appointments Wound #1 Right,Anterior Lower Leg o Return Appointment in 1 week. Edema Control Wound #1 Right,Anterior Lower Leg o Patient to wear own compression stockings o  Elevate legs to the level of the heart and pump ankles as often as possible Additional Orders / Instructions Wound #1 Right,Anterior Lower Leg o Stop Smoking o Increase protein intake. o Activity as tolerated ARONDA, BURFORD ([id number]) Electronic Signature(s) Signed: 04/01/2015 4:38:48 PM By: Christin Fudge MD, FACS Signed: 04/01/2015 5:20:37 PM By: Regan Lemming BSN, RN Entered By: Regan Lemming on 04/01/2015 15:10:20 Billinger, [name] ([id number]) -------------------------------------------------------------------------------- Problem List Details Patient Name: CHRISTON, GALLAWAY. Date of Service: 04/01/2015 2:45 PM Medical Record Number: [id number] Patient Account Number: [id number] Date of Birth/Sex: 1932/10/15 (82 y.o. 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 Date of Service: 04/01/2015 2:45 PM Medical Record Number: [id number] Patient Account Number: [id number] Date of Birth/Sex: 19-Jun-1932 (82 y.o. Female) Treating RN: Macarthur Critchley Primary Care Physician:  Benita Stabile Other Clinician: Referring Physician: Benita Stabile Treating Physician/Extender: Frann Rider in Treatment: 5 Subjective Chief Complaint Information obtained from Patient Patient presents to the wound care center with open non-healing surgical wound(s) which has been there on the right lower extremity for about 4 weeks History of Present Illness (HPI) The following HPI elements were documented for the patient's wound: Location: right lower extremity Quality: Patient reports experiencing a dull pain to affected area(s). Severity: Patient states wound are getting worse. Duration: Patient has had the wound for < 4 weeks prior to presenting for treatment Timing: Pain in wound is Intermittent (comes and goes Context: The wound occurred when the patient habits sutures removed from a dermatology excision of a squamous cell carcinoma Modifying Factors: Other treatment(s) tried include: she is on doxycycline at the present time and prior to that had Keflex Associated Signs and Symptoms: Patient reports having difficulty standing for long periods. 82 year old patient sent to [place] by her dermatologist Dr. Kirkland Hun for a decreased surgical wound of the right lower extremity where a excision and primary repair was done in early November. Pathology showed a squamous cell carcinoma with no residual tumor seen. Postoperatively the patient was put on antibiotics for a wound infection and initially she had Keflex given by her PCP. The patient was then switched to doxycycline by the dermatologist for a presumed MRSA. Sutures were removed 3 weeks later and there was partial dehiscence of the surgical wound with no purulent drainage. At the present time the patient says she has minimal pain but no pus drainage or fever. The patient is a smoker and smokes a packet of cigarettes for the last 65 years 03/01/2015 -- the patient is still on doxycycline and this was only started on Saturday.  She continues to smoke about half pack of cigarettes a day and says she is working on quitting. She has an appointment to see her dermatologist this Wednesday. 03/25/2015 -- she is off antibiotics now and she feels she's been doing much better. She continues to smoke though and has been working on quitting. TAKAKO, MINCKLER R. ([id number]) Objective Constitutional Pulse regular. Respirations normal and unlabored. Afebrile. Vitals Time Taken: 2:53 PM, Height: 63 in, Weight: 180 lbs, BMI: 31.9, Temperature: 98.1 F, Pulse: 84 bpm, Blood Pressure: 149/71 mmHg. Eyes Nonicteric. Reactive to light. Ears, Nose, Mouth, and Throat Lips, teeth, and gums WNL.Marland Kitchen Moist mucosa without lesions. Neck supple and nontender. No palpable supraclavicular or cervical adenopathy. Normal sized without goiter. Respiratory WNL. No retractions.. Cardiovascular Pedal Pulses WNL. No clubbing, cyanosis or edema. Lymphatic No adneopathy. No adenopathy. No adenopathy. Musculoskeletal Adexa without tenderness or enlargement.. Digits and nails w/o clubbing, cyanosis, infection, petechiae, ischemia, or inflammatory conditions.Marland Kitchen Psychiatric Judgement and insight Intact.. No evidence of depression, anxiety, or agitation.. General Notes: the  wound has minimal slough in the depths and I have sharply removed this with a forcep and moist gauze. We also have used some silver nitrate to control the bleeding and the hyper granulation tissue. Integumentary (Hair, Skin) No suspicious lesions. No crepitus or fluctuance. No peri-wound warmth or erythema. No masses.. Wound #1 status is Open. Original cause of wound was Surgical Injury. The wound is located on the Right,Anterior Lower Leg. The wound measures 1.2cm length x 0.6cm width x 0.1cm depth; 0.565cm^2 area and 0.057cm^3 volume. The wound is limited to skin breakdown. There is no tunneling or undermining noted. There is a small amount of serous drainage noted. The wound margin  is distinct with the outline attached to the wound base. There is large (67-100%) pink granulation within the wound bed. There is a small (1-33%) amount of necrotic tissue within the wound bed including Adherent Slough. The periwound skin appearance exhibited: Scarring, Maceration, Erythema. The periwound skin appearance did not Schwanke, Derrika R. ([id number]) exhibit: Callus, Crepitus, Excoriation, Fluctuance, Friable, Induration, Localized Edema, Rash, Dry/Scaly, Moist, Atrophie Blanche, Cyanosis, Ecchymosis, Hemosiderin Staining, Mottled, Pallor, Rubor. The surrounding wound skin color is noted with erythema. Periwound temperature was noted as No Abnormality. Assessment Active Problems ICD-10 C44.722 - 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, including hip T81.31XA - Disruption of external operation (surgical) wound, not elsewhere classified, initial encounter L97.212 - Non-pressure chronic ulcer of right calf with fat layer exposed F17.218 - Nicotine dependence, cigarettes, with other nicotine-induced disorders Procedures Wound #1 Wound #1 is a Malignant Wound located on the Right,Anterior Lower Leg . There was a Non-Viable Tissue Open Wound/Selective [phone]) debridement with total area of 0.72 sq cm performed by Christin Fudge, MD. with the following instrument(s): Forceps to remove Non-Viable tissue/material including Fibrin/Slough and Subcutaneous after achieving pain control using Lidocaine 4% Topical Solution. A time out was not conducted prior to the start of the procedure. A Minimum amount of bleeding was controlled with Silver Nitrate. The procedure was tolerated well with a pain level of 0 throughout and a pain level of 0 following the procedure. Post Debridement Measurements: 1.2cm length x 0.6cm width x 0.1cm depth; 0.057cm^3 volume. Post procedure Diagnosis Wound #1: Same as Pre-Procedure Plan Wound Cleansing: Wound #1 Right,Anterior Lower Leg: Clean wound  with Normal Saline. Anesthetic: Wound #1 Right,Anterior Lower Leg: Topical Lidocaine 4% cream applied to wound bed prior to debridement Cabiness, Averie R. ([id number]) Primary Wound Dressing: Wound #1 Right,Anterior Lower Leg: Prisma Ag Secondary Dressing: Wound #1 Right,Anterior Lower Leg: Boardered Foam Dressing Dressing Change Frequency: Wound #1 Right,Anterior Lower Leg: Change dressing every other day. Follow-up Appointments: Wound #1 Right,Anterior Lower Leg: Return Appointment in 1 week. Edema Control: Wound #1 Right,Anterior Lower Leg: Patient to wear own compression stockings Elevate legs to the level of the heart and pump ankles as often as possible Additional Orders / Instructions: Wound #1 Right,Anterior Lower Leg: Stop Smoking Increase protein intake. Activity as tolerated I have recommended applying Prisma Ag and a bordered foam and continue with elevation and exercise. We've also discussed quitting smoking and she says she is going to try and be compliant. Electronic Signature(s) Signed: 04/01/2015 3:12:07 PM By: Christin Fudge MD, FACS Entered By: Christin Fudge on 04/01/2015 15:12:07 Hammad, [name] ([id number]) -------------------------------------------------------------------------------- SuperBill Details Patient Name: [name]. Date of Service: 04/01/2015 Medical Record Number: [id number] Patient Account Number: [id number] Date of Birth/Sex: Sep 19, 1932 (82 y.o. Female) Treating RN: Macarthur Critchley Primary Care Physician: Benita Stabile Other Clinician: Referring  Physician: Benita Stabile Treating Physician/Extender: Frann Rider in Treatment: 5 Diagnosis Coding ICD-10 Codes Code Description [phone] 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, including hip Disruption of external operation (surgical) wound, not elsewhere classified, initial T81.31XA encounter L97.212 Non-pressure chronic ulcer of right calf with fat layer exposed F17.218  Nicotine dependence, cigarettes, with other nicotine-induced disorders Facility Procedures CPT4: Description Modifier Quantity Code [id number] - DEBRIDE WOUND 1ST 20 SQ CM OR < 1 ICD-10 Description Diagnosis C44.722 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, including hip T81.31XA Disruption of external operation (surgical)  wound, not elsewhere classified, initial encounter L97.212 Non-pressure chronic ulcer of right calf with fat layer exposed F17.218 Nicotine dependence, cigarettes, with other nicotine-induced disorders Physician Procedures CPT4: Description Modifier Quantity Code [id number] - WC PHYS DEBR WO ANESTH 20 SQ CM 1 ICD-10 Description Diagnosis C44.722 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, including hip T81.31XA Disruption of external operation (surgical) wound,  not elsewhere classified, initial encounter L97.212 Non-pressure chronic ulcer of right calf with fat layer exposed F17.218 Nicotine dependence, cigarettes, with other nicotine-induced disorders Electronic Signature(s) Signed: 04/01/2015 3:12:28 PM By: Christin Fudge MD, FACS Irizarry, [place] ([id number]) Entered By: Christin Fudge on 04/01/2015 15:12:28

## 2015-04-02 NOTE — Progress Notes (Signed)
[name], [name] ([id number]) Visit Report for 04/01/2015 Arrival Information Details Patient Name: [name]. Date of Service: 04/01/2015 2:45 PM Medical Record Number: [id number] Patient Account Number: [id number] Date of Birth/Sex: 1932-05-04 (82 y.o. Female) Treating RN: Macarthur Critchley Primary Care Physician: Benita Stabile Other Clinician: Referring Physician: Benita Stabile Treating Physician/Extender: Frann Rider in Treatment: 5 Visit Information History Since Last Visit All ordered tests and consults were completed: No Patient Arrived: Ambulatory Added or deleted any medications: No Arrival Time: 14:51 Any new allergies or adverse reactions: No Accompanied By: self Had a fall or experienced change in No Transfer Assistance: None activities of daily living that may affect Patient Identification Verified: Yes risk of falls: Secondary Verification Process Yes Signs or symptoms of abuse/neglect since last No Completed: visito Patient Requires Transmission- Yes Hospitalized since last visit: No Based Precautions: Has Dressing in Place as Prescribed: Yes Transmission-Based Precautions: Contact Pain Present Now: No MRSA Patient Has Alerts: Yes Electronic Signature(s) Signed: 04/01/2015 4:39:39 PM By: Rebecca Eaton RN, Sendra Entered By: Rebecca Eaton RN, Sendra on 04/01/2015 14:52:10 [name] ([id number]) -------------------------------------------------------------------------------- Encounter Discharge Information Details Patient Name: Brianna Lia. Date of Service: 04/01/2015 2:45 PM Medical Record Number: [id number] Patient Account Number: [id number] Date of Birth/Sex: October 14, 1932 (82 y.o. Female) Treating RN: Macarthur Critchley Primary Care Physician: Benita Stabile Other Clinician: Referring Physician: Benita Stabile Treating Physician/Extender: Frann Rider in Treatment: 5 Encounter Discharge Information Items Discharge Pain Level: 0 Discharge Condition:  Stable Ambulatory Status: Ambulatory Discharge Destination: Home Transportation: Private Auto Accompanied By: self Schedule Follow-up Appointment: Yes Medication Reconciliation completed and provided to Patient/Care Yes Chisa Kushner: Provided on Clinical Summary of Care: 04/01/2015 Form Type Recipient Paper Patient JW Electronic Signature(s) Signed: 04/01/2015 5:20:37 PM By: Regan Lemming BSN, RN Previous Signature: 04/01/2015 3:14:42 PM Version By: Ruthine Dose Entered By: Regan Lemming on 04/01/2015 15:15:14 [name] ([id number]) -------------------------------------------------------------------------------- Lower Extremity Assessment Details Patient Name: Brianna Lia. Date of Service: 04/01/2015 2:45 PM Medical Record Number: [id number] Patient Account Number: [id number] Date of Birth/Sex: 03/17/1933 (82 y.o. Female) Treating RN: Macarthur Critchley Primary Care Physician: Benita Stabile Other Clinician: Referring Physician: Benita Stabile Treating Physician/Extender: Frann Rider in Treatment: 5 Edema Assessment Assessed: [Left: No] [Right: No] E[Left: dema] [Right: :] Calf Left: Right: Point of Measurement: 33 cm From Medial Instep cm 34.7 cm Ankle Left: Right: Point of Measurement: 10 cm From Medial Instep cm 21.8 cm Vascular Assessment Pulses: Posterior Tibial Dorsalis Pedis Palpable: [Right:Yes] Extremity colors, hair growth, and conditions: Extremity Color: [Right:Normal] Hair Growth on Extremity: [Right:No] Temperature of Extremity: [Right:Warm] Capillary Refill: [Right:< 3 seconds] Electronic Signature(s) Signed: 04/01/2015 4:39:39 PM By: Rebecca Eaton, RN, Sendra Entered By: Rebecca Eaton RN, Sendra on 04/01/2015 14:56:23 [name] ([id number]) -------------------------------------------------------------------------------- Multi Wound Chart Details Patient Name: Brianna Lia. Date of Service: 04/01/2015 2:45 PM Medical Record Number: [id number] Patient  Account Number: [id number] Date of Birth/Sex: April 15, 1932 (82 y.o. Female) Treating RN: Baruch Gouty, RN, BSN, Velva Harman Primary Care Physician: Benita Stabile Other Clinician: Referring Physician: Benita Stabile Treating Physician/Extender: Frann Rider in Treatment: 5 Vital Signs Height(in): 63 Pulse(bpm): 84 Weight(lbs): 180 Blood Pressure 149/71 (mmHg): Body Mass Index(BMI): 32 Temperature(F): 98.1 Respiratory Rate (breaths/min): Photos: [1:No Photos] [N/A:N/A] Wound Location: [1:Right Lower Leg - Anterior N/A] Wounding Event: [1:Surgical Injury] [N/A:N/A] Primary Etiology: [1:Malignant Wound] [N/A:N/A] Comorbid History: [1:Cataracts, Osteoarthritis N/A] Date Acquired: [1:01/26/2015] [N/A:N/A] Weeks of Treatment: [1:5] [N/A:N/A] Wound Status: [1:Open] [N/A:N/A] Measurements L x W x D 1.2x0.6x0.1 [N/A:N/A] (cm) Area (cm) : [1:0.565] [  N/A:N/A] Volume (cm) : [1:0.057] [N/A:N/A] % Reduction in Area: [1:92.00%] [N/A:N/A] % Reduction in Volume: 98.00% [N/A:N/A] Classification: [1:Full Thickness Without Exposed Support Structures] [N/A:N/A] Exudate Amount: [1:Small] [N/A:N/A] Exudate Type: [1:Serous] [N/A:N/A] Exudate Color: [1:amber] [N/A:N/A] Wound Margin: [1:Distinct, outline attached N/A] Granulation Amount: [1:Large (67-100%)] [N/A:N/A] Granulation Quality: [1:Pink, Hyper-granulation N/A] Necrotic Amount: [1:Small (1-33%)] [N/A:N/A] Exposed Structures: [1:Fascia: No Fat: No Tendon: No Muscle: No Joint: No Bone: No] [N/A:N/A] Limited to Skin Breakdown Epithelialization: Small (1-33%) N/A N/A Periwound Skin Texture: Scarring: Yes N/A N/A Edema: No Excoriation: No Induration: No Callus: No Crepitus: No Fluctuance: No Friable: No Rash: No Periwound Skin Maceration: Yes N/A N/A Moisture: Moist: No Dry/Scaly: No Periwound Skin Color: Erythema: Yes N/A N/A Atrophie Blanche: No Cyanosis: No Ecchymosis: No Hemosiderin Staining: No Mottled: No Pallor: No Rubor:  No Temperature: No Abnormality N/A N/A Tenderness on No N/A N/A Palpation: Wound Preparation: Ulcer Cleansing: N/A N/A Rinsed/Irrigated with Saline Topical Anesthetic Applied: Other: lidocaine 4% Treatment Notes Electronic Signature(s) Signed: 04/01/2015 5:20:37 PM By: Regan Lemming BSN, RN Entered By: Regan Lemming on 04/01/2015 15:05:29 [place], [name] ([id number]) -------------------------------------------------------------------------------- [place] Details Patient Name: Brianna Lia. Date of Service: 04/01/2015 2:45 PM Medical Record Number: [id number] Patient Account Number: [id number] Date of Birth/Sex: December 11, 1932 (82 y.o. Female) Treating RN: Afful, RN, BSN, Velva Harman Primary Care Physician: Benita Stabile Other Clinician: Referring Physician: Benita Stabile Treating Physician/Extender: Frann Rider in Treatment: 5 Active Inactive Malignancy/Atypical Etiology Nursing Diagnoses: Knowledge deficit related to disease process and management of atypical ulcer etiology Goals: Patient/caregiver will verbalize understanding of disease process and disease management of atypical ulcer etiology Date Initiated: 02/22/2015 Goal Status: Active Interventions: Assess patient and family medical history for signs and symptoms of malignancy/atypical etiology upon admission Notes: Orientation to the Wound Care Program Nursing Diagnoses: Knowledge deficit related to the wound healing center program Goals: Patient/caregiver will verbalize understanding of the [facility] Program Date Initiated: 02/22/2015 Goal Status: Active Interventions: Provide education on orientation to the wound center Notes: Soft Tissue Infection Nursing Diagnoses: Impaired tissue integrity Potential for infection: soft tissue Else, Marycruz R. ([id number]) Goals: Patient will remain free of wound infection Date Initiated: 02/22/2015 Goal Status: Active Patient's soft tissue infection  will resolve Date Initiated: 02/22/2015 Goal Status: Active Interventions: Assess signs and symptoms of infection every visit Treatment Activities: Systemic antibiotics : 04/01/2015 Notes: Wound/Skin Impairment Nursing Diagnoses: Impaired tissue integrity Goals: Ulcer/skin breakdown will heal within 14 weeks Date Initiated: 02/22/2015 Goal Status: Active Interventions: Assess patient/caregiver ability to obtain necessary supplies Notes: Electronic Signature(s) Signed: 04/01/2015 5:20:37 PM By: Regan Lemming BSN, RN Entered By: Regan Lemming on 04/01/2015 15:05:12 Bina, [name] ([id number]) -------------------------------------------------------------------------------- Pain Assessment Details Patient Name: Brianna Lia. Date of Service: 04/01/2015 2:45 PM Medical Record Number: [id number] Patient Account Number: [id number] Date of Birth/Sex: [birth year] (82 y.o. Female) Treating RN: Macarthur Critchley Primary Care Physician: Benita Stabile Other Clinician: Referring Physician: Benita Stabile Treating Physician/Extender: Frann Rider in Treatment: 5 Active Problems Location of Pain Severity and Description of Pain Patient Has Paino No Site Locations Rate the pain. Current Pain Level: 0 Pain Management and Medication Current Pain Management: Electronic Signature(s) Signed: 04/01/2015 4:39:39 PM By: Rebecca Eaton RN, Sendra Entered By: Rebecca Eaton RN, Sendra on 04/01/2015 14:52:44 Fineberg, [name] ([id number]) -------------------------------------------------------------------------------- Patient/Caregiver Education Details Patient Name: Brianna Lia. Date of Service: 04/01/2015 2:45 PM Medical Record Number: [id number] Patient Account Number: [id number] Date of Birth/Gender: 1932/11/30 (82 y.o. Female) Treating RN: Macarthur Critchley Primary Care  Physician: Benita Stabile Other Clinician: Referring Physician: Benita Stabile Treating Physician/Extender: Frann Rider in Treatment:  5 Education Assessment Education Provided To: Patient Education Topics Provided Wound/Skin Impairment: Handouts: Caring for Your Ulcer, Skin Care Do's and Dont's, Smoking and Wound Healing Methods: Explain/Verbal Responses: State content correctly Electronic Signature(s) Signed: 04/01/2015 4:39:39 PM By: Rebecca Eaton RN, Sendra Entered By: Rebecca Eaton RN, Sendra on 04/01/2015 14:57:51 Kuehne, [name] ([id number]) -------------------------------------------------------------------------------- Wound Assessment Details Patient Name: Brianna Lia. Date of Service: 04/01/2015 2:45 PM Medical Record Number: [id number] Patient Account Number: [id number] Date of Birth/Sex: Nov 03, 1932 (82 y.o. Female) Treating RN: Macarthur Critchley Primary Care Physician: Benita Stabile Other Clinician: Referring Physician: Benita Stabile Treating Physician/Extender: Frann Rider in Treatment: 5 Wound Status Wound Number: 1 Primary Etiology: Malignant Wound Wound Location: Right Lower Leg - Anterior Wound Status: Open Wounding Event: Surgical Injury Comorbid History: Cataracts, Osteoarthritis Date Acquired: 01/26/2015 Weeks Of Treatment: 5 Clustered Wound: No Photos Photo Uploaded By: Alric Quan on 04/01/2015 16:27:22 Wound Measurements Length: (cm) 1.2 Width: (cm) 0.6 Depth: (cm) 0.1 Area: (cm) 0.565 Volume: (cm) 0.057 % Reduction in Area: 92% % Reduction in Volume: 98% Epithelialization: Small (1-33%) Tunneling: No Undermining: No Wound Description Full Thickness Without Exposed Foul Odor Aft Classification: Support Structures Wound Margin: Distinct, outline attached Exudate Small Amount: Exudate Type: Serous Exudate Color: amber er Cleansing: No Wound Bed Granulation Amount: Large (67-100%) Exposed Structure Granulation Quality: Pink, Hyper-granulation Fascia Exposed: No Necrotic Amount: Small (1-33%) Fat Layer Exposed: No Dollins, Luciana R. ([id number]) Necrotic Quality:  Adherent Slough Tendon Exposed: No Muscle Exposed: No Joint Exposed: No Bone Exposed: No Limited to Skin Breakdown Periwound Skin Texture Texture Color No Abnormalities Noted: No No Abnormalities Noted: No Callus: No Atrophie Blanche: No Crepitus: No Cyanosis: No Excoriation: No Ecchymosis: No Fluctuance: No Erythema: Yes Friable: No Hemosiderin Staining: No Induration: No Mottled: No Localized Edema: No Pallor: No Rash: No Rubor: No Scarring: Yes Temperature / Pain Moisture Temperature: No Abnormality No Abnormalities Noted: No Dry / Scaly: No Maceration: Yes Moist: No Wound Preparation Ulcer Cleansing: Rinsed/Irrigated with Saline Topical Anesthetic Applied: Other: lidocaine 4%, Treatment Notes Wound #1 (Right, Anterior Lower Leg) 1. Cleansed with: Clean wound with Normal Saline 2. Anesthetic Topical Lidocaine 4% cream to wound bed prior to debridement 3. Peri-wound Care: Barrier cream 4. Dressing Applied: Prisma Ag 5. Secondary Dressing Applied Bordered Foam Dressing Notes telfa Electronic Signature(s) Signed: 04/01/2015 4:39:39 PM By: Rebecca Eaton, RN, Sendra Entered By: Rebecca Eaton RN, Sendra on 04/01/2015 14:57:21 [place]liff, [name] ([id number]) Lipa, [name] ([id number]) -------------------------------------------------------------------------------- Vitals Details Patient Name: Brianna Lia. Date of Service: 04/01/2015 2:45 PM Medical Record Number: [id number] Patient Account Number: [id number] Date of Birth/Sex: [birth year] (82 y.o. Female) Treating RN: Macarthur Critchley Primary Care Physician: Benita Stabile Other Clinician: Referring Physician: Benita Stabile Treating Physician/Extender: Frann Rider in Treatment: 5 Vital Signs Time Taken: 14:53 Temperature (F): 98.1 Height (in): 63 Pulse (bpm): 84 Weight (lbs): 180 Blood Pressure (mmHg): 149/71 Body Mass Index (BMI): 31.9 Reference Range: 80 - 120 mg / dl Electronic Signature(s) Signed:  04/01/2015 4:39:39 PM By: Rebecca Eaton RN, Sendra Entered By: Rebecca Eaton RN, Sendra on 04/01/2015 14:53:00

## 2015-04-08 ENCOUNTER — Encounter: Payer: Medicare HMO | Admitting: Surgery

## 2015-04-08 DIAGNOSIS — T8131XA Disruption of external operation (surgical) wound, not elsewhere classified, initial encounter: Secondary | ICD-10-CM | POA: Diagnosis not present

## 2015-04-09 NOTE — Progress Notes (Signed)
SHANN, LEWELLYN ([id number]) Visit Report for 04/08/2015 Chief Complaint Document Details Patient Name: [name], [name]. Date of Service: 04/08/2015 2:15 PM Medical Record Number: [id number] Patient Account Number: [id number] Date of Birth/Sex: Mar 21, 1932 (82 y.o. Female) Treating RN: Ahmed Prima Primary Care Physician: Benita Stabile Other Clinician: Referring Physician: Benita Stabile Treating Physician/Extender: Frann Rider in Treatment: 6 Information Obtained from: Patient Chief Complaint Patient presents to the wound care center with open non-healing surgical wound(s) which has been there on the right lower extremity for about 4 weeks Electronic Signature(s) Signed: 04/08/2015 2:09:33 PM By: Christin Fudge MD, FACS Entered By: Christin Fudge on 04/08/2015 14:09:33 Lopezperez, [name] ([id number]) -------------------------------------------------------------------------------- Debridement Details Patient Name: [name]. Date of Service: 04/08/2015 2:15 PM Medical Record Number: [id number] Patient Account Number: [id number] Date of Birth/Sex: 1932-10-13 (82 y.o. Female) Treating RN: Carolyne Fiscal, Debi Primary Care Physician: TATE, [facility] Other Clinician: Referring Physician: Benita Stabile Treating Physician/Extender: Frann Rider in Treatment: 6 Debridement Performed for Wound #1 Right,Anterior Lower Leg Assessment: Performed By: Physician Christin Fudge, MD Debridement: Debridement Pre-procedure Yes Verification/Time Out Taken: Start Time: 14:04 Pain Control: Other : lidocaine 4% cream Level: Skin/Subcutaneous Tissue Total Area Debrided (L x 1 (cm) x 0.4 (cm) = 0.4 (cm) W): Tissue and other Viable, Non-Viable, Exudate, Fibrin/Slough, Subcutaneous material debrided: Instrument: Forceps Bleeding: Minimum Hemostasis Achieved: Pressure End Time: 14:07 Procedural Pain: 0 Post Procedural Pain: 0 Response to Treatment: Procedure was tolerated well Post Debridement  Measurements of Total Wound Length: (cm) 1 Width: (cm) 0.4 Depth: (cm) 0.1 Volume: (cm) 0.031 Post Procedure Diagnosis Same as Pre-procedure Electronic Signature(s) Signed: 04/08/2015 2:09:26 PM By: Christin Fudge MD, FACS Signed: 04/08/2015 5:39:40 PM By: Alric Quan Entered By: Christin Fudge on 04/08/2015 14:09:26 Tilmon, [name] ([id number]) -------------------------------------------------------------------------------- HPI Details Patient Name: [name]. Date of Service: 04/08/2015 2:15 PM Medical Record Number: [id number] Patient Account Number: [id number] Date of Birth/Sex: [birth year] (82 y.o. Female) Treating RN: Ahmed Prima Primary Care Physician: TATE, [facility] Other Clinician: Referring Physician: Benita Stabile Treating Physician/Extender: Frann Rider in Treatment: 6 History of Present Illness Location: right lower extremity Quality: Patient reports experiencing a dull pain to affected area(s). Severity: Patient states wound are getting worse. Duration: Patient has had the wound for < 4 weeks prior to presenting for treatment Timing: Pain in wound is Intermittent (comes and goes Context: The wound occurred when the patient habits sutures removed from a dermatology excision of a squamous cell carcinoma Modifying Factors: Other treatment(s) tried include: she is on doxycycline at the present time and prior to that had Keflex Associated Signs and Symptoms: Patient reports having difficulty standing for long periods. HPI Description: 82 year old patient sent to [place] by her dermatologist Dr. Kirkland Hun for a decreased surgical wound of the right lower extremity where a excision and primary repair was done in early November. Pathology showed a squamous cell carcinoma with no residual tumor seen. Postoperatively the patient was put on antibiotics for a wound infection and initially she had Keflex given by her PCP. The patient was then switched to doxycycline by  the dermatologist for a presumed MRSA. Sutures were removed 3 weeks later and there was partial dehiscence of the surgical wound with no purulent drainage. At the present time the patient says she has minimal pain but no pus drainage or fever. The patient is a smoker and smokes a packet of cigarettes for the last 65 years 03/01/2015 -- the patient is still on doxycycline and this was only started  on Saturday. She continues to smoke about half pack of cigarettes a day and says she is working on quitting. She has an appointment to see her dermatologist this Wednesday. 03/25/2015 -- she is off antibiotics now and she feels she's been doing much better. She continues to smoke though and has been working on quitting. 04/08/2015 -- she had a lot of itching around the wound and has been scratching there quite significantly and has caused excoriation of the skin surrounding her main wound. Electronic Signature(s) Signed: 04/08/2015 2:10:02 PM By: Christin Fudge MD, FACS Entered By: Christin Fudge on 04/08/2015 14:10:01 Ginger, [name] ([id number]) -------------------------------------------------------------------------------- Physical Exam Details Patient Name: [name]. Date of Service: 04/08/2015 2:15 PM Medical Record Number: [id number] Patient Account Number: [id number] Date of Birth/Sex: September 13, 1932 (82 y.o. Female) Treating RN: Ahmed Prima Primary Care Physician: Benita Stabile Other Clinician: Referring Physician: Benita Stabile Treating Physician/Extender: Frann Rider in Treatment: 6 Constitutional . Pulse regular. Respirations normal and unlabored. Afebrile. . Eyes Nonicteric. Reactive to light. Ears, Nose, Mouth, and Throat Lips, teeth, and gums WNL.Marland Kitchen Moist mucosa without lesions. Neck supple and nontender. No palpable supraclavicular or cervical adenopathy. Normal sized without goiter. Respiratory WNL. No retractions.. Cardiovascular Pedal Pulses WNL. No clubbing,  cyanosis or edema. Chest Breasts symmetical and no nipple discharge.. Breast tissue WNL, no masses, lumps, or tenderness.. Lymphatic No adneopathy. No adenopathy. No adenopathy. Musculoskeletal Adexa without tenderness or enlargement.. Digits and nails w/o clubbing, cyanosis, infection, petechiae, ischemia, or inflammatory conditions.. Integumentary (Hair, Skin) No suspicious lesions. No crepitus or fluctuance. No peri-wound warmth or erythema. No masses.Marland Kitchen Psychiatric Judgement and insight Intact.. No evidence of depression, anxiety, or agitation.. Notes the main wound has minimal slough which has been sharply debrided and this is down to the subcutaneous redness tissue and bleeding controlled with pressure. The surrounding area has a lot of excoriation due to scratching and may have a superficial fungal infection which we will treat appropriately. Electronic Signature(s) Signed: 04/08/2015 2:10:49 PM By: Christin Fudge MD, FACS Entered By: Christin Fudge on 04/08/2015 14:10:49 Hitch, [name] ([id number]) -------------------------------------------------------------------------------- Physician Orders Details Patient Name: [name]. Date of Service: 04/08/2015 2:15 PM Medical Record Number: [id number] Patient Account Number: [id number] Date of Birth/Sex: 1932/05/10 (82 y.o. Female) Treating RN: Carolyne Fiscal, Debi Primary Care Physician: TATE, [facility] Other Clinician: Referring Physician: Benita Stabile Treating Physician/Extender: Frann Rider in Treatment: 6 Verbal / Phone Orders: Yes ClinicianCarolyne Fiscal, Debi Read Back and Verified: Yes Diagnosis Coding Wound Cleansing Wound #1 Right,Anterior Lower Leg o Clean wound with Normal Saline. Anesthetic Wound #1 Right,Anterior Lower Leg o Topical Lidocaine 4% cream applied to wound bed prior to debridement Primary Wound Dressing Wound #1 Right,Anterior Lower Leg o Prisma Ag Secondary Dressing Wound #1 Right,Anterior  Lower Leg o Gauze and Kerlix/Conform Dressing Change Frequency Wound #1 Right,Anterior Lower Leg o Change dressing every other day. Follow-up Appointments Wound #1 Right,Anterior Lower Leg o Return Appointment in 1 week. Edema Control Wound #1 Right,Anterior Lower Leg o Patient to wear own compression stockings o Elevate legs to the level of the heart and pump ankles as often as possible Additional Orders / Instructions Wound #1 Right,Anterior Lower Leg o Stop Smoking o Increase protein intake. o Activity as tolerated NGAN, QUALLS ([id number]) Electronic Signature(s) Signed: 04/08/2015 4:22:03 PM By: Christin Fudge MD, FACS Signed: 04/08/2015 5:39:40 PM By: Alric Quan Entered By: Alric Quan on 04/08/2015 14:07:38 Jackson, [name] ([id number]) -------------------------------------------------------------------------------- Problem List Details Patient Name: MALAINA, MORTELLARO. Date of  Service: 04/08/2015 2:15 PM Medical Record Number: [id number] Patient Account Number: [id number] Date of Birth/Sex: 06/06/1932 (82 y.o. Female) Treating RN: Ahmed Prima Primary Care Physician: Benita Stabile Other Clinician: Referring Physician: Benita Stabile Treating Physician/Extender: Frann Rider in Treatment: 6 Active Problems ICD-10 Encounter Code Description Active Date Diagnosis C44.722 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, 02/22/2015 Yes including hip T81.31XA Disruption of external operation (surgical) wound, not 02/22/2015 Yes elsewhere classified, initial encounter L97.212 Non-pressure chronic ulcer of right calf with fat layer 02/22/2015 Yes exposed F17.218 Nicotine dependence, cigarettes, with other nicotine- 02/22/2015 Yes induced disorders Inactive Problems Resolved Problems Electronic Signature(s) Signed: 04/08/2015 2:09:17 PM By: Christin Fudge MD, FACS Entered By: Christin Fudge on 04/08/2015 14:09:17 Gutierres, [name]  ([id number]) -------------------------------------------------------------------------------- Progress Note Details Patient Name: [name]. Date of Service: 04/08/2015 2:15 PM Medical Record Number: [id number] Patient Account Number: [id number] Date of Birth/Sex: Jul 31, 1932 (82 y.o. Female) Treating RN: Ahmed Prima Primary Care Physician: TATE, [facility] Other Clinician: Referring Physician: Benita Stabile Treating Physician/Extender: Frann Rider in Treatment: 6 Subjective Chief Complaint Information obtained from Patient Patient presents to the wound care center with open non-healing surgical wound(s) which has been there on the right lower extremity for about 4 weeks History of Present Illness (HPI) The following HPI elements were documented for the patient's wound: Location: right lower extremity Quality: Patient reports experiencing a dull pain to affected area(s). Severity: Patient states wound are getting worse. Duration: Patient has had the wound for < 4 weeks prior to presenting for treatment Timing: Pain in wound is Intermittent (comes and goes Context: The wound occurred when the patient habits sutures removed from a dermatology excision of a squamous cell carcinoma Modifying Factors: Other treatment(s) tried include: she is on doxycycline at the present time and prior to that had Keflex Associated Signs and Symptoms: Patient reports having difficulty standing for long periods. 82 year old patient sent to [place] by her dermatologist Dr. Kirkland Hun for a decreased surgical wound of the right lower extremity where a excision and primary repair was done in early November. Pathology showed a squamous cell carcinoma with no residual tumor seen. Postoperatively the patient was put on antibiotics for a wound infection and initially she had Keflex given by her PCP. The patient was then switched to doxycycline by the dermatologist for a presumed MRSA. Sutures were removed 3  weeks later and there was partial dehiscence of the surgical wound with no purulent drainage. At the present time the patient says she has minimal pain but no pus drainage or fever. The patient is a smoker and smokes a packet of cigarettes for the last 65 years 03/01/2015 -- the patient is still on doxycycline and this was only started on Saturday. She continues to smoke about half pack of cigarettes a day and says she is working on quitting. She has an appointment to see her dermatologist this Wednesday. 03/25/2015 -- she is off antibiotics now and she feels she's been doing much better. She continues to smoke though and has been working on quitting. 04/08/2015 -- she had a lot of itching around the wound and has been scratching there quite significantly and has caused excoriation of the skin surrounding her main wound. ALAYSIA, LIGHTLE R. ([id number]) Objective Constitutional Pulse regular. Respirations normal and unlabored. Afebrile. Vitals Time Taken: 1:50 PM, Height: 63 in, Weight: 180 lbs, BMI: 31.9, Temperature: 98.3 F, Pulse: 78 bpm, Respiratory Rate: 20 breaths/min, Blood Pressure: 133/57 mmHg. Eyes Nonicteric. Reactive to light. Ears,  Nose, Mouth, and Throat Lips, teeth, and gums WNL.Marland Kitchen Moist mucosa without lesions. Neck supple and nontender. No palpable supraclavicular or cervical adenopathy. Normal sized without goiter. Respiratory WNL. No retractions.. Cardiovascular Pedal Pulses WNL. No clubbing, cyanosis or edema. Chest Breasts symmetical and no nipple discharge.. Breast tissue WNL, no masses, lumps, or tenderness.. Lymphatic No adneopathy. No adenopathy. No adenopathy. Musculoskeletal Adexa without tenderness or enlargement.. Digits and nails w/o clubbing, cyanosis, infection, petechiae, ischemia, or inflammatory conditions.Marland Kitchen Psychiatric Judgement and insight Intact.. No evidence of depression, anxiety, or agitation.. General Notes: the main wound has minimal slough  which has been sharply debrided and this is down to the subcutaneous redness tissue and bleeding controlled with pressure. The surrounding area has a lot of excoriation due to scratching and may have a superficial fungal infection which we will treat appropriately. Integumentary (Hair, Skin) No suspicious lesions. No crepitus or fluctuance. No peri-wound warmth or erythema. No masses.. Wound #1 status is Open. Original cause of wound was Surgical Injury. The wound is located on the Right,Anterior Lower Leg. The wound measures 1cm length x 0.4cm width x 0.1cm depth; 0.314cm^2 area Lisenbee, Ameyah R. ([id number]) and 0.031cm^3 volume. The wound is limited to skin breakdown. There is no tunneling or undermining noted. There is a medium amount of serous drainage noted. The wound margin is distinct with the outline attached to the wound base. There is large (67-100%) pink granulation within the wound bed. There is a small (1-33%) amount of necrotic tissue within the wound bed including Adherent Slough. The periwound skin appearance exhibited: Scarring, Maceration, Erythema. The periwound skin appearance did not exhibit: Callus, Crepitus, Excoriation, Fluctuance, Friable, Induration, Localized Edema, Rash, Dry/Scaly, Moist, Atrophie Blanche, Cyanosis, Ecchymosis, Hemosiderin Staining, Mottled, Pallor, Rubor. The surrounding wound skin color is noted with erythema. Periwound temperature was noted as No Abnormality. Assessment Active Problems ICD-10 C44.722 - 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, including hip T81.31XA - Disruption of external operation (surgical) wound, not elsewhere classified, initial encounter L97.212 - Non-pressure chronic ulcer of right calf with fat layer exposed F17.218 - Nicotine dependence, cigarettes, with other nicotine-induced disorders I have recommended some nystatin cream to the area surrounding the wound where there is excoriation. We will continue with  Prisma but full not use a bordered foam as this may have caused her a reaction. She will use gauze and Kerlix dressing for this week Procedures Wound #1 Wound #1 is a Malignant Wound located on the Right,Anterior Lower Leg . There was a Skin/Subcutaneous Tissue Debridement ([id number]) debridement with total area of 0.4 sq cm performed by Christin Fudge, MD. with the following instrument(s): Forceps to remove Viable and Non-Viable tissue/material including Exudate, Fibrin/Slough, and Subcutaneous after achieving pain control using Other (lidocaine 4% cream). A time out was conducted prior to the start of the procedure. A Minimum amount of bleeding was controlled with Pressure. The procedure was tolerated well with a pain level of 0 throughout and a pain level of 0 following the procedure. Post Debridement Measurements: 1cm length x 0.4cm width x 0.1cm depth; 0.031cm^3 volume. Post procedure Diagnosis Wound #1: Same as Pre-Procedure Bertholf, Raegan R. ([id number]) Plan Wound Cleansing: Wound #1 Right,Anterior Lower Leg: Clean wound with Normal Saline. Anesthetic: Wound #1 Right,Anterior Lower Leg: Topical Lidocaine 4% cream applied to wound bed prior to debridement Primary Wound Dressing: Wound #1 Right,Anterior Lower Leg: Prisma Ag Secondary Dressing: Wound #1 Right,Anterior Lower Leg: Gauze and Kerlix/Conform Dressing Change Frequency: Wound #1 Right,Anterior Lower Leg: Change dressing  every other day. Follow-up Appointments: Wound #1 Right,Anterior Lower Leg: Return Appointment in 1 week. Edema Control: Wound #1 Right,Anterior Lower Leg: Patient to wear own compression stockings Elevate legs to the level of the heart and pump ankles as often as possible Additional Orders / Instructions: Wound #1 Right,Anterior Lower Leg: Stop Smoking Increase protein intake. Activity as tolerated I have recommended some nystatin cream to the area surrounding the wound where there is  excoriation. We will continue with Prisma but full not use a bordered foam as this may have caused her a reaction. She will use gauze and Kerlix dressing for this week Electronic Signature(s) Signed: 04/08/2015 2:11:30 PM By: Christin Fudge MD, FACS Entered By: Christin Fudge on 04/08/2015 14:11:30 Faye, [name] ([id number]) -------------------------------------------------------------------------------- SuperBill Details Patient Name: [name]. Date of Service: 04/08/2015 Medical Record Number: [id number] Patient Account Number: [id number] Date of Birth/Sex: Apr 26, 1932 (82 y.o. Female) Treating RN: Carolyne Fiscal, Debi Primary Care Physician: TATE, [facility] Other Clinician: Referring Physician: Benita Stabile Treating Physician/Extender: Frann Rider in Treatment: 6 Diagnosis Coding ICD-10 Codes Code Description [phone] 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, including hip Disruption of external operation (surgical) wound, not elsewhere classified, initial T81.31XA encounter L97.212 Non-pressure chronic ulcer of right calf with fat layer exposed F17.218 Nicotine dependence, cigarettes, with other nicotine-induced disorders Facility Procedures CPT4: Description Modifier Quantity Code [id number] 11042 - DEB SUBQ TISSUE 20 SQ CM/< 1 ICD-10 Description Diagnosis C44.722 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, including hip T81.31XA Disruption of external operation (surgical) wound, not  elsewhere classified, initial encounter L97.212 Non-pressure chronic ulcer of right calf with fat layer exposed F17.218 Nicotine dependence, cigarettes, with other nicotine-induced disorders Physician Procedures CPT4: Description Modifier Quantity Code [id number] 11042 - WC PHYS SUBQ TISS 20 SQ CM 1 ICD-10 Description Diagnosis C44.722 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, including hip T81.31XA Disruption of external operation (surgical) wound, not  elsewhere classified,  initial encounter L97.212 Non-pressure chronic ulcer of right calf with fat layer exposed F17.218 Nicotine dependence, cigarettes, with other nicotine-induced disorders Electronic Signature(s) Signed: 04/08/2015 2:11:45 PM By: Christin Fudge MD, FACS [place], [place] ([id number]) Entered By: Christin Fudge on 04/08/2015 14:11:45

## 2015-04-09 NOTE — Progress Notes (Signed)
Brianna, Solomon ([id number]) Visit Report for 04/08/2015 Arrival Information Details Patient Name: [name], [name]. Date of Service: 04/08/2015 2:15 PM Medical Record Number: [id number] Patient Account Number: [id number] Date of Birth/Sex: November 23, 1932 (82 y.o. Female) Treating RN: Ahmed Prima Primary Care Physician: TATE, [facility] Other Clinician: Referring Physician: Benita Stabile Treating Physician/Extender: Frann Rider in Treatment: 6 Visit Information History Since Last Visit All ordered tests and consults were completed: No Patient Arrived: Ambulatory Added or deleted any medications: No Arrival Time: 13:49 Any new allergies or adverse reactions: No Accompanied By: self Had a fall or experienced change in No Transfer Assistance: None activities of daily living that may affect Patient Identification Verified: Yes risk of falls: Secondary Verification Process Yes Signs or symptoms of abuse/neglect since last No Completed: visito Patient Requires Transmission- Yes Hospitalized since last visit: No Based Precautions: Pain Present Now: No Transmission-Based Precautions: Contact MRSA Patient Has Alerts: Yes Electronic Signature(s) Signed: 04/08/2015 5:39:40 PM By: Alric Quan Entered By: Alric Quan on 04/08/2015 13:49:38 Erker, [name] ([id number]) -------------------------------------------------------------------------------- Encounter Discharge Information Details Patient Name: [name]. Date of Service: 04/08/2015 2:15 PM Medical Record Number: [id number] Patient Account Number: [id number] Date of Birth/Sex: 03-06-1933 (82 y.o. Female) Treating RN: Ahmed Prima Primary Care Physician: TATE, [facility] Other Clinician: Referring Physician: Benita Stabile Treating Physician/Extender: Frann Rider in Treatment: 6 Encounter Discharge Information Items Discharge Pain Level: 0 Discharge Condition: Stable Ambulatory Status: Ambulatory Discharge  Destination: Home Transportation: Private Auto Accompanied By: self Schedule Follow-up Appointment: Yes Medication Reconciliation completed and provided to Patient/Care Yes Glennon Kopko: Provided on Clinical Summary of Care: 04/08/2015 Form Type Recipient Paper Patient JW Electronic Signature(s) Signed: 04/08/2015 2:23:10 PM By: Ruthine Dose Entered By: Ruthine Dose on 04/08/2015 14:23:09 Mccartney, [name] ([id number]) -------------------------------------------------------------------------------- Lower Extremity Assessment Details Patient Name: [name]. Date of Service: 04/08/2015 2:15 PM Medical Record Number: [id number] Patient Account Number: [id number] Date of Birth/Sex: 01-22-1933 (82 y.o. Female) Treating RN: Ahmed Prima Primary Care Physician: TATE, [facility] Other Clinician: Referring Physician: Benita Stabile Treating Physician/Extender: Frann Rider in Treatment: 6 Edema Assessment Assessed: [Left: No] [Right: No] E[Left: dema] [Right: :] Calf Left: Right: Point of Measurement: cm From Medial Instep cm 34.5 cm Ankle Left: Right: Point of Measurement: cm From Medial Instep cm 22 cm Vascular Assessment Pulses: Posterior Tibial Dorsalis Pedis Palpable: [Right:Yes] Extremity colors, hair growth, and conditions: Extremity Color: [Right:Normal] Temperature of Extremity: [Right:Warm] Capillary Refill: [Right:< 3 seconds] Toe Nail Assessment Left: Right: Thick: No Discolored: No Deformed: No Improper Length and Hygiene: No Electronic Signature(s) Signed: 04/08/2015 5:39:40 PM By: Alric Quan Entered By: Alric Quan on 04/08/2015 13:53:58 Kramar, [name] ([id number]) -------------------------------------------------------------------------------- Multi Wound Chart Details Patient Name: [name]. Date of Service: 04/08/2015 2:15 PM Medical Record Number: [id number] Patient Account Number: [id number] Date of Birth/Sex: 1932-12-20 (82 y.o.  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] [N/A:N/A] Comorbid History: [1:Cataracts, Osteoarthritis N/A] Date Acquired: [1:01/26/2015] [N/A:N/A] Weeks of Treatment: [1:6] [N/A:N/A] Wound Status: [1:Open] [N/A:N/A] Measurements L x W x D 5x7.5x0.1 [N/A:N/A] (cm) Area (cm) : [1:29.452] [N/A:N/A] Volume (cm) : [1:2.945] [N/A:N/A] % Reduction in Area: [1:-318.50%] [N/A:N/A] % Reduction  in Volume: -4.60% [N/A:N/A] Classification: [1:Full Thickness Without Exposed Support Structures] [N/A:N/A] Exudate Amount: [1:Medium] [N/A:N/A] Exudate Type: [1:Serous] [N/A:N/A] Exudate Color: [1:amber] [N/A:N/A] Wound Margin: [1:Distinct, outline attached N/A] Granulation Amount: [1:Large (67-100%)] [N/A:N/A] Granulation Quality: [1:Pink, Hyper-granulation N/A] Necrotic Amount: [1:Small (1-33%)] [N/A:N/A] Exposed Structures: [1:Fascia: No Fat: No Tendon: No Muscle: No Joint: No Bone: No] [N/A:N/A] Limited to Skin Breakdown Epithelialization: None N/A N/A Periwound Skin Texture: Scarring: Yes N/A N/A Edema: No Excoriation: No Induration: No Callus: No Crepitus: No Fluctuance: No Friable: No Rash: No Periwound Skin Maceration: Yes N/A N/A Moisture: Moist: No Dry/Scaly: No Periwound Skin Color: Erythema: Yes N/A N/A Atrophie Blanche: No Cyanosis: No Ecchymosis: No Hemosiderin Staining: No Mottled: No Pallor: No Rubor: No Temperature: No Abnormality N/A N/A Tenderness on No N/A  N/A Palpation: Wound Preparation: Ulcer Cleansing: N/A N/A Rinsed/Irrigated with Saline Topical Anesthetic Applied: Other: lidocaine 4% Treatment Notes Electronic Signature(s) Signed: 04/08/2015 5:39:40 PM By: Alric Quan Entered By: Alric Quan on 04/08/2015 14:01:55 Wallander, [name] ([id number]) -------------------------------------------------------------------------------- [place] Details Patient Name: [name]. Date of Service: 04/08/2015 2:15 PM Medical Record Number: [id number] Patient Account Number: [id number] Date of Birth/Sex: 20-Jul-1932 (82 y.o. Female) Treating RN: Ahmed Prima Primary Care Physician: TATE, [facility] Other Clinician: Referring Physician: Benita Stabile Treating Physician/Extender: Frann Rider in Treatment: 6 Active Inactive Malignancy/Atypical Etiology Nursing Diagnoses: Knowledge deficit related to disease process and management of atypical ulcer etiology Goals: Patient/caregiver will verbalize understanding of disease process and disease management of atypical ulcer etiology Date Initiated: 02/22/2015 Goal Status: Active Interventions: Assess patient and family medical history for signs and symptoms of malignancy/atypical etiology upon admission Notes: Orientation to the Wound Care Program Nursing Diagnoses: Knowledge deficit related to the wound healing center program Goals: Patient/caregiver will verbalize understanding of the [place] Program Date Initiated: 02/22/2015 Goal Status: Active Interventions: Provide education on orientation to the wound center Notes: Soft Tissue Infection Nursing Diagnoses: Impaired tissue integrity Potential for infection: soft tissue Bloyd, Tita R. ([id number]) Goals: Patient will remain free of wound infection Date Initiated: 02/22/2015 Goal Status: Active Patient's soft tissue infection will resolve Date Initiated: 02/22/2015 Goal Status:  Active Interventions: Assess signs and symptoms of infection every visit Treatment Activities: Systemic antibiotics : 04/08/2015 Notes: Wound/Skin Impairment Nursing Diagnoses: Impaired tissue integrity Goals: Ulcer/skin breakdown will heal within 14 weeks Date Initiated: 02/22/2015 Goal Status: Active Interventions: Assess patient/caregiver ability to obtain necessary supplies Notes: Electronic Signature(s) Signed: 04/08/2015 5:39:40 PM By: Alric Quan Entered By: Alric Quan on 04/08/2015 14:01:48 Cantin, [name] ([id number]) -------------------------------------------------------------------------------- Pain Assessment Details Patient Name: [name]. Date of Service: 04/08/2015 2:15 PM Medical Record Number: [id number] Patient Account Number: [id number] Date of Birth/Sex: October 26, 1932 (82 y.o. Female) Treating RN: Ahmed Prima Primary Care Physician: Benita Stabile Other Clinician: Referring Physician: Benita Stabile Treating Physician/Extender: Frann Rider in Treatment: 6 Active Problems Location of Pain Severity and Description of Pain Patient Has Paino No Site Locations Pain Management and Medication Current Pain Management: Electronic Signature(s) Signed: 04/08/2015 5:39:40 PM By: Alric Quan Entered By: Alric Quan on 04/08/2015 13:49:43 Kenney, [name] ([id number]) -------------------------------------------------------------------------------- Patient/Caregiver Education Details Patient Name: [name]. Date of Service: 04/08/2015 2:15 PM Medical Record Number: [id number] Patient Account Number: [id number] Date of Birth/Gender: December 01, 1932 (82 y.o. Female) Treating RN: Ahmed Prima Primary Care Physician: Benita Stabile Other Clinician: Referring Physician: Benita Stabile Treating Physician/Extender: Frann Rider in Treatment: 6 Education Assessment Education Provided To: Patient Education Topics Provided Wound/Skin  Impairment: Handouts:  Other: change dressing as ordered Methods: Demonstration, Explain/Verbal Responses: State content correctly Electronic Signature(s) Signed: 04/08/2015 5:39:40 PM By: Alric Quan Entered By: Alric Quan on 04/08/2015 14:09:17 Trudell, [name] ([id number]) -------------------------------------------------------------------------------- Wound Assessment Details Patient Name: [name]. Date of Service: 04/08/2015 2:15 PM Medical Record Number: [id number] Patient Account Number: [id number] Date of Birth/Sex: 11-Jan-1933 (82 y.o. Female) Treating RN: Carolyne Fiscal, Debi Primary Care Physician: TATE, [facility] Other Clinician: Referring Physician: Benita Stabile Treating Physician/Extender: Frann Rider in Treatment: 6 Wound Status Wound Number: 1 Primary Etiology: Malignant Wound Wound Location: Right, Anterior Lower Leg Wound Status: Open Wounding Event: Surgical Injury Comorbid History: Cataracts, Osteoarthritis Date Acquired: 01/26/2015 Weeks Of Treatment: 6 Clustered Wound: No Photos Photo Uploaded By: Alric Quan on 04/08/2015 17:12:46 Wound Measurements Length: (cm) 1 Width: (cm) 0.4 Depth: (cm) 0.1 Area: (cm) 0.314 Volume: (cm) 0.031 % Reduction in Area: 95.5% % Reduction in Volume: 98.9% Epithelialization: None Tunneling: No Undermining: No Wound Description Full Thickness Without Exposed Foul Odor Aft Classification: Support Structures Wound Margin: Distinct, outline attached Exudate Medium Amount: Exudate Type: Serous Exudate Color: amber er Cleansing: No Wound Bed Granulation Amount: Large (67-100%) Exposed Structure Granulation Quality: Pink, Hyper-granulation Fascia Exposed: No Necrotic Amount: Small (1-33%) Fat Layer Exposed: No Allie, Lanya R. ([id number]) Necrotic Quality: Adherent Slough Tendon Exposed: No Muscle Exposed: No Joint Exposed: No Bone Exposed: No Limited to Skin Breakdown Periwound Skin  Texture Texture Color No Abnormalities Noted: No No Abnormalities Noted: No Callus: No Atrophie Blanche: No Crepitus: No Cyanosis: No Excoriation: No Ecchymosis: No Fluctuance: No Erythema: Yes Friable: No Hemosiderin Staining: No Induration: No Mottled: No Localized Edema: No Pallor: No Rash: No Rubor: No Scarring: Yes Temperature / Pain Moisture Temperature: No Abnormality No Abnormalities Noted: No Dry / Scaly: No Maceration: Yes Moist: No Wound Preparation Ulcer Cleansing: Rinsed/Irrigated with Saline Topical Anesthetic Applied: Other: lidocaine 4%, Treatment Notes Wound #1 (Right, Anterior Lower Leg) 1. Cleansed with: Clean wound with Normal Saline 2. Anesthetic Topical Lidocaine 4% cream to wound bed prior to debridement 4. Dressing Applied: Prisma Ag 5. Secondary Dressing Applied Gauze and Kerlix/Conform 7. Secured with Tape Notes netting Electronic Signature(s) Signed: 04/08/2015 5:39:40 PM By: Alric Quan Entered By: Alric Quan on 04/08/2015 14:06:51 Watts, LONNA RABOLD ([id number]) Filter, [name] ([id number]) -------------------------------------------------------------------------------- Vitals Details Patient Name: [name]. Date of Service: 04/08/2015 2:15 PM Medical Record Number: [id number] Patient Account Number: [id number] Date of Birth/Sex: 1932/03/21 (82 y.o. Female) Treating RN: Carolyne Fiscal, Debi Primary Care Physician: TATE, [facility] Other Clinician: Referring Physician: Benita Stabile Treating Physician/Extender: Frann Rider in Treatment: 6 Vital Signs Time Taken: 13:50 Temperature (F): 98.3 Height (in): 63 Pulse (bpm): 78 Weight (lbs): 180 Respiratory Rate (breaths/min): 20 Body Mass Index (BMI): 31.9 Blood Pressure (mmHg): 133/57 Reference Range: 80 - 120 mg / dl Electronic Signature(s) Signed: 04/08/2015 5:39:40 PM By: Alric Quan Entered By: Alric Quan on 04/08/2015 13:51:38

## 2015-04-15 ENCOUNTER — Encounter: Payer: Medicare HMO | Admitting: Surgery

## 2015-04-15 DIAGNOSIS — T8131XA Disruption of external operation (surgical) wound, not elsewhere classified, initial encounter: Secondary | ICD-10-CM | POA: Diagnosis not present

## 2015-04-16 NOTE — Progress Notes (Addendum)
[name] ([id number]) Visit Report for 04/15/2015 Chief Complaint Document Details Patient Name: [name], [name]. Date of Service: 04/15/2015 8:00 AM Medical Record Number: [id number] Patient Account Number: [id number] Date of Birth/Sex: 04/24/1932 (82 y.o. Female) Treating RN: Macarthur Critchley Primary Care Physician: Benita Stabile Other Clinician: Referring Physician: Benita Stabile Treating Physician/Extender: Frann Rider in Treatment: 7 Information Obtained from: Patient Chief Complaint Patient presents to the wound care center with open non-healing surgical wound(s) which has been there on the right lower extremity for about 4 weeks Electronic Signature(s) Signed: 04/15/2015 8:31:40 AM By: Christin Fudge MD, FACS Entered By: Christin Fudge on 04/15/2015 08:31:40 Dentler, [name] ([id number]) -------------------------------------------------------------------------------- HPI Details Patient Name: [name]. Date of Service: 04/15/2015 8:00 AM Medical Record Number: [id number] Patient Account Number: [id number] Date of Birth/Sex: 22-Nov-1932 (82 y.o. Female) Treating RN: Macarthur Critchley Primary Care Physician: Benita Stabile Other Clinician: Referring Physician: Benita Stabile Treating Physician/Extender: Frann Rider in Treatment: 7 History of Present Illness Location: right lower extremity Quality: Patient reports experiencing a dull pain to affected area(s). Severity: Patient states wound are getting worse. Duration: Patient has had the wound for < 4 weeks prior to presenting for treatment Timing: Pain in wound is Intermittent (comes and goes Context: The wound occurred when the patient habits sutures removed from a dermatology excision of a squamous cell carcinoma Modifying Factors: Other treatment(s) tried include: she is on doxycycline at the present time and prior to that had Keflex Associated Signs and Symptoms: Patient reports having difficulty standing for long  periods. HPI Description: 82 year old patient sent to [place] by her dermatologist Dr. Kirkland Hun for a decreased surgical wound of the right lower extremity where a excision and primary repair was done in early November. Pathology showed a squamous cell carcinoma with no residual tumor seen. Postoperatively the patient was put on antibiotics for a wound infection and initially she had Keflex given by her PCP. The patient was then switched to doxycycline by the dermatologist for a presumed MRSA. Sutures were removed 3 weeks later and there was partial dehiscence of the surgical wound with no purulent drainage. At the present time the patient says she has minimal pain but no pus drainage or fever. The patient is a smoker and smokes a packet of cigarettes for the last 65 years 03/01/2015 -- the patient is still on doxycycline and this was only started on Saturday. She continues to smoke about half pack of cigarettes a day and says she is working on quitting. She has an appointment to see her dermatologist this Wednesday. 03/25/2015 -- she is off antibiotics now and she feels she's been doing much better. She continues to smoke though and has been working on quitting. 04/08/2015 -- she had a lot of itching around the wound and has been scratching there quite significantly and has caused excoriation of the skin surrounding her main wound. Electronic Signature(s) Signed: 04/15/2015 8:31:45 AM By: Christin Fudge MD, FACS Entered By: Christin Fudge on 04/15/2015 08:31:45 Dura, [name] ([id number]) -------------------------------------------------------------------------------- Physical Exam Details Patient Name: [name]. Date of Service: 04/15/2015 8:00 AM Medical Record Number: [id number] Patient Account Number: [id number] Date of Birth/Sex: May 30, 1932 (82 y.o. Female) Treating RN: Macarthur Critchley Primary Care Physician: Benita Stabile Other Clinician: Referring Physician: Benita Stabile Treating  Physician/Extender: Frann Rider in Treatment: 7 Constitutional . Pulse regular. Respirations normal and unlabored. Afebrile. . Eyes Nonicteric. Reactive to light. Ears, Nose, Mouth, and Throat Lips, teeth, and gums WNL.Marland Kitchen Moist mucosa without  lesions. Neck supple and nontender. No palpable supraclavicular or cervical adenopathy. Normal sized without goiter. Respiratory WNL. No retractions.. Cardiovascular Pedal Pulses WNL. No clubbing, cyanosis or edema. Lymphatic No adneopathy. No adenopathy. No adenopathy. Musculoskeletal Adexa without tenderness or enlargement.. Digits and nails w/o clubbing, cyanosis, infection, petechiae, ischemia, or inflammatory conditions.. Integumentary (Hair, Skin) No suspicious lesions. No crepitus or fluctuance. No peri-wound warmth or erythema. No masses.Marland Kitchen Psychiatric Judgement and insight Intact.. No evidence of depression, anxiety, or agitation.. Notes the surrounding area which had a lot of excoriation and scratching is much better and there is less of a fungal infection. the wound is covered with some eschar and once this was sharply debrided the main wound is quite small and has healthy granulation tissue. Electronic Signature(s) Signed: 04/15/2015 8:32:51 AM By: Christin Fudge MD, FACS Entered By: Christin Fudge on 04/15/2015 08:32:51 Cofield, [name] ([id number]) -------------------------------------------------------------------------------- Physician Orders Details Patient Name: [name]. Date of Service: 04/15/2015 8:00 AM Medical Record Number: [id number] Patient Account Number: [id number] Date of Birth/Sex: [birth year] (82 y.o. Female) Treating RN: Macarthur Critchley Primary Care Physician: Benita Stabile Other Clinician: Referring Physician: Benita Stabile Treating Physician/Extender: Frann Rider in Treatment: 7 Verbal / Phone Orders: Yes Clinician: Macarthur Critchley Read Back and Verified: No Diagnosis Coding Wound  Cleansing Wound #1 Right,Anterior Lower Leg o Clean wound with Normal Saline. Anesthetic Wound #1 Right,Anterior Lower Leg o Topical Lidocaine 4% cream applied to wound bed prior to debridement Primary Wound Dressing Wound #1 Right,Anterior Lower Leg o Prisma Ag Secondary Dressing Wound #1 Right,Anterior Lower Leg o Gauze and Kerlix/Conform Dressing Change Frequency Wound #1 Right,Anterior Lower Leg o Change dressing every other day. Follow-up Appointments Wound #1 Right,Anterior Lower Leg o Return Appointment in 1 week. Edema Control Wound #1 Right,Anterior Lower Leg o Patient to wear own compression stockings o Elevate legs to the level of the heart and pump ankles as often as possible Additional Orders / Instructions Wound #1 Right,Anterior Lower Leg o Stop Smoking o Increase protein intake. o Activity as tolerated TRENISE, TURAY ([id number]) Electronic Signature(s) Signed: 04/15/2015 4:27:41 PM By: Christin Fudge MD, FACS Signed: 04/15/2015 4:29:37 PM By: Rebecca Eaton RN, Sendra Entered By: Rebecca Eaton RN, Sendra on 04/15/2015 08:25:33 Baril, [name] ([id number]) -------------------------------------------------------------------------------- Problem List Details Patient Name: JESSIC, STANDIFER. Date of Service: 04/15/2015 8:00 AM Medical Record Number: [id number] Patient Account Number: [id number] Date of Birth/Sex: 1932-12-31 (82 y.o. 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 Date of Service: 04/15/2015 8:00 AM Medical Record Number: [id number] Patient Account Number: [id number] Date of Birth/Sex: 06-09-1932 (82 y.o. Female) Treating RN: Macarthur Critchley Primary Care Physician: Benita Stabile Other Clinician: Referring Physician: Benita Stabile Treating Physician/Extender: Frann Rider in Treatment: 7 Subjective Chief Complaint Information obtained from Patient Patient presents to the wound care center with open non-healing surgical wound(s) which has  been there on the right lower extremity for about 4 weeks History of Present Illness (HPI) The following HPI elements were documented for the patient's wound: Location: right lower extremity Quality: Patient reports experiencing a dull pain to affected area(s). Severity: Patient states wound are getting worse. Duration: Patient has had the wound for < 4 weeks prior to presenting for treatment Timing: Pain in wound is Intermittent (comes and goes Context: The wound occurred when the patient habits sutures removed from a dermatology excision of a squamous cell carcinoma Modifying Factors: Other treatment(s) tried include: she is on doxycycline at the present time and prior to that had Keflex Associated Signs and Symptoms: Patient reports having difficulty standing for long periods. 82 year old patient sent to [place] by her dermatologist Dr. Kirkland Hun for a decreased surgical wound of the right lower extremity where a excision and primary repair was done in early  November. Pathology showed a squamous cell carcinoma with no residual tumor seen. Postoperatively the patient was put on antibiotics for a wound infection and initially she had Keflex given by her PCP. The patient was then switched to doxycycline by the dermatologist for a presumed MRSA. Sutures were removed 3 weeks later and there was partial dehiscence of the surgical wound with no purulent drainage. At the present time the patient says she has minimal pain but no pus drainage or fever. The patient is a smoker and smokes a packet of cigarettes for the last 65 years 03/01/2015 -- the patient is still on doxycycline and this was only started on Saturday. She continues to smoke about half pack of cigarettes a day and says she is working on quitting. She has an appointment to see her dermatologist this Wednesday. 03/25/2015 -- she is off antibiotics now and she feels she's been doing much better. She continues to smoke though and has been working on quitting. 04/08/2015 -- she had a lot of itching around the wound and has been scratching there quite significantly and has caused excoriation of the skin surrounding her main wound. TASHIKA, GOODIN R. ([id number]) Objective Constitutional Pulse regular. Respirations normal and unlabored. Afebrile. Vitals Time Taken: 8:12 AM, Height: 63 in, Weight: 180 lbs, BMI: 31.9, Temperature: 98.1 F, Pulse: 70 bpm, Blood Pressure: 140/63 mmHg. Eyes Nonicteric. Reactive to light. Ears, Nose, Mouth, and Throat Lips, teeth, and gums WNL.Marland Kitchen Moist mucosa without lesions. Neck supple and nontender. No palpable supraclavicular or cervical adenopathy. Normal sized without goiter. Respiratory WNL. No retractions.. Cardiovascular Pedal Pulses WNL. No clubbing, cyanosis or edema. Lymphatic No adneopathy. No adenopathy. No adenopathy. Musculoskeletal Adexa without tenderness or enlargement.. Digits and nails w/o clubbing, cyanosis, infection, petechiae, ischemia,  or inflammatory conditions.Marland Kitchen Psychiatric Judgement and insight Intact.. No evidence of depression, anxiety, or agitation.. General Notes: the surrounding area which had a lot of excoriation and scratching is much better and there is less of a fungal infection. the wound is covered with some eschar and once this was sharply debrided the main wound is quite small and has healthy granulation tissue. Integumentary (Hair, Skin) No suspicious lesions. No crepitus or fluctuance. No peri-wound warmth or erythema. No masses.. Wound #1 status is Open. Original cause of wound was Surgical Injury. The wound is located on the Right,Anterior Lower Leg. The wound measures 0.4cm length x 0.2cm width x 0.1cm depth; 0.063cm^2 area and 0.006cm^3 volume. The wound is limited to skin breakdown. There is no tunneling or undermining noted. There is a small amount of serous drainage noted. The wound margin is distinct  with the outline attached to the wound base. There is large (67-100%) pink granulation within the wound bed. There is a Risse, Rylynn R. ([id number]) small (1-33%) amount of necrotic tissue within the wound bed including Adherent Slough. The periwound skin appearance did not exhibit: Callus, Crepitus, Excoriation, Fluctuance, Friable, Induration, Localized Edema, Rash, Scarring, Dry/Scaly, Maceration, Moist, Atrophie Blanche, Cyanosis, Ecchymosis, Hemosiderin Staining, Mottled, Pallor, Rubor, Erythema. Periwound temperature was noted as No Abnormality. Assessment Active Problems ICD-10 C44.722 - 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, including hip T81.31XA - Disruption of external operation (surgical) wound, not elsewhere classified, initial encounter L97.212 - Non-pressure chronic ulcer of right calf with fat layer exposed F17.218 - Nicotine dependence, cigarettes, with other nicotine-induced disorders Plan Wound Cleansing: Wound #1 Right,Anterior Lower Leg: Clean wound with Normal  Saline. Anesthetic: Wound #1 Right,Anterior Lower Leg: Topical Lidocaine 4% cream applied to wound bed prior to debridement Primary Wound Dressing: Wound #1 Right,Anterior Lower Leg: Prisma Ag Secondary Dressing: Wound #1 Right,Anterior Lower Leg: Gauze and Kerlix/Conform Dressing Change Frequency: Wound #1 Right,Anterior Lower Leg: Change dressing every other day. Follow-up Appointments: Wound #1 Right,Anterior Lower Leg: Return Appointment in 1 week. Edema Control: Wound #1 Right,Anterior Lower Leg: Patient to wear own compression stockings Elevate legs to the level of the heart and pump ankles as often as possible Additional Orders / Instructions: Wound #1 Right,Anterior Lower Leg: [place], Alexa R. ([id number]) Stop Smoking Increase protein intake. Activity as tolerated I have recommended some nystatin cream to the area surrounding the wound where there is excoriation. We will continue with Prisma but not use a bordered foam as this may have caused her a reaction. She will use gauze and Kerlix dressing for this week. Electronic Signature(s) Signed: 04/16/2015 10:44:36 AM By: Christin Fudge MD, FACS Previous Signature: 04/16/2015 10:44:27 AM Version By: Christin Fudge MD, FACS Previous Signature: 04/15/2015 8:33:26 AM Version By: Christin Fudge MD, FACS Entered By: Christin Fudge on 04/16/2015 10:44:36 Bunting, [name] ([id number]) -------------------------------------------------------------------------------- SuperBill Details Patient Name: [name]. Date of Service: 04/15/2015 Medical Record Number: [id number] Patient Account Number: [id number] Date of Birth/Sex: 12-15-1932 (82 y.o. Female) Treating RN: Macarthur Critchley Primary Care Physician: Benita Stabile Other Clinician: Referring Physician: Benita Stabile Treating Physician/Extender: Frann Rider in Treatment: 7 Diagnosis Coding ICD-10 Codes Code Description [phone] Squamous cell carcinoma of skin of right lower  limb, including hip Disruption of external operation (surgical) wound, not elsewhere classified, initial T81.31XA encounter L97.212 Non-pressure chronic ulcer of right calf with fat layer exposed F17.218 Nicotine dependence, cigarettes, with other nicotine-induced disorders Facility Procedures CPT4 Code: [id number] Description: 99213 - WOUND CARE VISIT-LEV 3 EST PT Modifier: Quantity: 1 Physician Procedures CPT4: Description Modifier Quantity Code [id number] 99213 - WC PHYS LEVEL 3 - EST PT 1 ICD-10 Description Diagnosis C44.722 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, including hip T81.31XA Disruption of external operation (surgical) wound, not  elsewhere classified, initial encounter L97.212 Non-pressure chronic ulcer of right calf with fat layer exposed F17.218 Nicotine dependence, cigarettes, with other nicotine-induced disorders Electronic Signature(s) Signed: 04/15/2015 4:27:41 PM By: Christin Fudge MD, FACS Signed: 04/15/2015 4:29:37 PM By: Rebecca Eaton RN, Sendra Previous Signature: 04/15/2015 8:34:45 AM Version By: Christin Fudge MD, FACS Entered By: Rebecca Eaton RN, Sendra on 04/15/2015 08:41:36

## 2015-04-16 NOTE — Progress Notes (Signed)
[name], [name] ([id number]) Visit Report for 04/15/2015 Arrival Information Details Patient Name: [name], [name]. Date of Service: 04/15/2015 8:00 AM Medical Record Number: [id number] Patient Account Number: [id number] Date of Birth/Sex: 1933/01/10 (82 y.o. Female) Treating RN: Macarthur Critchley Primary Care Physician: Benita Stabile Other Clinician: Referring Physician: Benita Stabile Treating Physician/Extender: Frann Rider in Treatment: 7 Visit Information History Since Last Visit All ordered tests and consults were completed: No Patient Arrived: Ambulatory Added or deleted any medications: No Arrival Time: 08:11 Any new allergies or adverse reactions: No Accompanied By: self Had a fall or experienced change in No Transfer Assistance: None activities of daily living that may affect Patient Identification Verified: Yes risk of falls: Secondary Verification Process Yes Signs or symptoms of abuse/neglect since last No Completed: visito Patient Requires Transmission- Yes Hospitalized since last visit: No Based Precautions: Has Dressing in Place as Prescribed: Yes Transmission-Based Precautions: Contact Pain Present Now: No MRSA Patient Has Alerts: Yes Electronic Signature(s) Signed: 04/15/2015 4:29:37 PM By: Rebecca Eaton, RN, Sendra Entered By: Rebecca Eaton RN, Sendra on 04/15/2015 08:11:29 [name], [name] ([id number]) -------------------------------------------------------------------------------- Clinic Level of Care Assessment Details Patient Name: [name]. Date of Service: 04/15/2015 8:00 AM Medical Record Number: [id number] Patient Account Number: [id number] Date of Birth/Sex: 21-Aug-1932 (82 y.o. Female) Treating RN: Macarthur Critchley Primary Care Physician: Benita Stabile Other Clinician: Referring Physician: Benita Stabile Treating Physician/Extender: Frann Rider in Treatment: 7 Clinic Level of Care Assessment Items TOOL 4 Quantity Score X - Use when only an EandM  is performed on FOLLOW-UP visit 1 0 ASSESSMENTS - Nursing Assessment / Reassessment X - Reassessment of Co-morbidities (includes updates in patient status) 1 10 X - Reassessment of Adherence to Treatment Plan 1 5 ASSESSMENTS - Wound and Skin Assessment / Reassessment X - Simple Wound Assessment / Reassessment - one wound 1 5 '[]'$  - Complex Wound Assessment / Reassessment - multiple wounds 0 '[]'$  - Dermatologic / Skin Assessment (not related to wound area) 0 ASSESSMENTS - Focused Assessment X - Circumferential Edema Measurements - multi extremities 1 5 '[]'$  - Nutritional Assessment / Counseling / Intervention 0 X - Lower Extremity Assessment (monofilament, tuning fork, pulses) 1 5 '[]'$  - Peripheral Arterial Disease Assessment (using hand held doppler) 0 ASSESSMENTS - Ostomy and/or Continence Assessment and Care '[]'$  - Incontinence Assessment and Management 0 '[]'$  - Ostomy Care Assessment and Management (repouching, etc.) 0 PROCESS - Coordination of Care X - Simple Patient / Family Education for ongoing care 1 15 '[]'$  - Complex (extensive) Patient / Family Education for ongoing care 0 X - Staff obtains Programmer, systems, Records, Test Results / Process Orders 1 10 '[]'$  - Staff telephones HHA, Nursing Homes / Clarify orders / etc 0 '[]'$  - Routine Transfer to another Facility (non-emergent condition) 0 [name], Brianna R. ([id number]) '[]'$  - Routine Hospital Admission (non-emergent condition) 0 '[]'$  - New Admissions / Biomedical engineer / Ordering NPWT, Apligraf, etc. 0 '[]'$  - Emergency Hospital Admission (emergent condition) 0 X - Simple Discharge Coordination 1 10 '[]'$  - Complex (extensive) Discharge Coordination 0 PROCESS - Special Needs '[]'$  - Pediatric / Minor Patient Management 0 '[]'$  - Isolation Patient Management 0 '[]'$  - Hearing / Language / Visual special needs 0 '[]'$  - Assessment of Community assistance (transportation, D/C planning, etc.) 0 '[]'$  - Additional assistance / Altered mentation 0 '[]'$  - Support Surface(s)  Assessment (bed, cushion, seat, etc.) 0 INTERVENTIONS - Wound Cleansing / Measurement X - Simple Wound Cleansing - one wound 1 5 '[]'$  - Complex Wound Cleansing -  multiple wounds 0 X - Wound Imaging (photographs - any number of wounds) 1 5 '[]'$  - Wound Tracing (instead of photographs) 0 X - Simple Wound Measurement - one wound 1 5 '[]'$  - Complex Wound Measurement - multiple wounds 0 INTERVENTIONS - Wound Dressings X - Small Wound Dressing one or multiple wounds 1 10 '[]'$  - Medium Wound Dressing one or multiple wounds 0 '[]'$  - Large Wound Dressing one or multiple wounds 0 X - Application of Medications - topical 1 5 '[]'$  - Application of Medications - injection 0 INTERVENTIONS - Miscellaneous '[]'$  - External ear exam 0 [name], Brianna R. ([id number]) '[]'$  - Specimen Collection (cultures, biopsies, blood, body fluids, etc.) 0 '[]'$  - Specimen(s) / Culture(s) sent or taken to Lab for analysis 0 '[]'$  - Patient Transfer (multiple staff / Harrel Lemon Lift / Similar devices) 0 '[]'$  - Simple Staple / Suture removal (25 or less) 0 '[]'$  - Complex Staple / Suture removal (26 or more) 0 '[]'$  - Hypo / Hyperglycemic Management (close monitor of Blood Glucose) 0 '[]'$  - Ankle / Brachial Index (ABI) - do not check if billed separately 0 X - Vital Signs 1 5 Has the patient been seen Brianna the hospital within the last three years: Yes Total Score: 100 Level Of Care: New/Established - Level 3 Electronic Signature(s) Signed: 04/15/2015 4:29:37 PM By: Rebecca Eaton, RN, Sendra Entered By: Rebecca Eaton RN, Sendra on 04/15/2015 08:41:25 [name], [name] ([id number]) -------------------------------------------------------------------------------- Encounter Discharge Information Details Patient Name: [name]. Date of Service: 04/15/2015 8:00 AM Medical Record Number: [id number] Patient Account Number: [id number] Date of Birth/Sex: [birth year] (82 y.o. Female) Treating RN: Macarthur Critchley Primary Care Physician: Benita Stabile Other Clinician: Referring  Physician: Benita Stabile Treating Physician/Extender: Frann Rider in Treatment: 7 Encounter Discharge Information Items Discharge Pain Level: 0 Discharge Condition: Stable Ambulatory Status: Ambulatory Discharge Destination: Home Transportation: Private Auto Accompanied By: self Schedule Follow-up Appointment: Yes Medication Reconciliation completed and provided to Patient/Care Yes [name]: Provided on Clinical Summary of Care: 04/15/2015 Form Type Recipient Paper Patient JW Electronic Signature(s) Signed: 04/15/2015 4:29:37 PM By: Rebecca Eaton RN, Sendra Previous Signature: 04/15/2015 8:37:19 AM Version By: Ruthine Dose Entered By: Rebecca Eaton RN, Sendra on 04/15/2015 08:42:35 [name], [name] ([id number]) -------------------------------------------------------------------------------- Lower Extremity Assessment Details Patient Name: [name]. Date of Service: 04/15/2015 8:00 AM Medical Record Number: [id number] Patient Account Number: [id number] Date of Birth/Sex: 02/11/1933 (82 y.o. Female) Treating RN: Macarthur Critchley Primary Care Physician: Benita Stabile Other Clinician: Referring Physician: Benita Stabile Treating Physician/Extender: Frann Rider in Treatment: 7 Edema Assessment Assessed: [Left: No] [Right: No] E[Left: dema] [Right: :] Calf Left: Right: Point of Measurement: 30 cm From Medial Instep cm 35 cm Ankle Left: Right: Point of Measurement: 10 cm From Medial Instep cm 22 cm Vascular Assessment Pulses: Posterior Tibial Dorsalis Pedis Palpable: [Right:Yes] Extremity colors, hair growth, and conditions: Extremity Color: [Right:Mottled] Hair Growth on Extremity: [Right:No] Temperature of Extremity: [Right:Cool] Capillary Refill: [Right:< 3 seconds] Dependent Rubor: [Right:No] Blanched when Elevated: [Right:No] Lipodermatosclerosis: [Right:No] Toe Nail Assessment Left: Right: Thick: No Discolored: No Deformed: No Improper Length and Hygiene:  No Electronic Signature(s) Signed: 04/15/2015 4:29:37 PM By: Rebecca Eaton RN, Lucilla Edin, Brock ([id number]) Entered By: Rebecca Eaton RN, Sendra on 04/15/2015 08:18:39 Goebel, [name] ([id number]) -------------------------------------------------------------------------------- Pain Assessment Details Patient Name: [name]. Date of Service: 04/15/2015 8:00 AM Medical Record Number: [id number] Patient Account Number: [id number] Date of Birth/Sex: [birth year] (82 y.o. Female) Treating RN: Macarthur Critchley Primary Care Physician: Benita Stabile Other Clinician: Referring Physician:  TATE, DENNY Treating Physician/Extender: Frann Rider in Treatment: 7 Active Problems Location of Pain Severity and Description of Pain Patient Has Paino No Site Locations Rate the pain. Current Pain Level: 0 Pain Management and Medication Current Pain Management: Electronic Signature(s) Signed: 04/15/2015 4:29:37 PM By: Rebecca Eaton, RN, Sendra Entered By: Rebecca Eaton RN, Sendra on 04/15/2015 08:12:14 [name], [name] ([id number]) -------------------------------------------------------------------------------- Patient/Caregiver Education Details Patient Name: [name]. Date of Service: 04/15/2015 8:00 AM Medical Record Number: [id number] Patient Account Number: [id number] Date of Birth/Gender: 05-21-1932 (82 y.o. Female) Treating RN: Macarthur Critchley Primary Care Physician: Benita Stabile Other Clinician: Referring Physician: Benita Stabile Treating Physician/Extender: Frann Rider in Treatment: 7 Education Assessment Education Provided To: Patient Education Topics Provided Smoking and Wound Healing: Handouts: Smoking and Wound Healing Methods: Explain/Verbal Responses: State content correctly Wound/Skin Impairment: Handouts: Caring for Your Ulcer, Skin Care Do's and Dont's Methods: Explain/Verbal Responses: State content correctly Electronic Signature(s) Signed: 04/15/2015 4:29:37 PM By:  Rebecca Eaton, RN, Sendra Entered By: Rebecca Eaton RN, Sendra on 04/15/2015 08:20:52 [name], [name] ([id number]) -------------------------------------------------------------------------------- Wound Assessment Details Patient Name: [name]. Date of Service: 04/15/2015 8:00 AM Medical Record Number: [id number] Patient Account Number: [id number] Date of Birth/Sex: 1933-02-13 (82 y.o. Female) Treating RN: Macarthur Critchley Primary Care Physician: Benita Stabile Other Clinician: Referring Physician: Benita Stabile Treating Physician/Extender: Frann Rider in Treatment: 7 Wound Status Wound Number: 1 Primary Etiology: Malignant Wound Wound Location: Right Lower Leg - Anterior Wound Status: Open Wounding Event: Surgical Injury Comorbid History: Cataracts, Osteoarthritis Date Acquired: 01/26/2015 Weeks Of Treatment: 7 Clustered Wound: No Photos Wound Measurements Length: (cm) 0.4 Width: (cm) 0.2 Depth: (cm) 0.1 Area: (cm) 0.063 Volume: (cm) 0.006 % Reduction in Area: 99.1% % Reduction in Volume: 99.8% Epithelialization: Large (67-100%) Tunneling: No Undermining: No Wound Description Full Thickness Without Exposed Foul Odor Aft Classification: Support Structures Wound Margin: Distinct, outline attached Exudate Small Amount: Exudate Type: Serous Exudate Color: amber er Cleansing: No Wound Bed Granulation Amount: Large (67-100%) Exposed Structure Granulation Quality: Pink, Hyper-granulation Fascia Exposed: No Necrotic Amount: Small (1-33%) Fat Layer Exposed: No [name], Kiylah R. ([id number]) Necrotic Quality: Adherent Slough Tendon Exposed: No Muscle Exposed: No Joint Exposed: No Bone Exposed: No Limited to Skin Breakdown Periwound Skin Texture Texture Color No Abnormalities Noted: No No Abnormalities Noted: No Callus: No Atrophie Blanche: No Crepitus: No Cyanosis: No Excoriation: No Ecchymosis: No Fluctuance: No Erythema: No Friable: No Hemosiderin  Staining: No Induration: No Mottled: No Localized Edema: No Pallor: No Rash: No Rubor: No Scarring: No Temperature / Pain Moisture Temperature: No Abnormality No Abnormalities Noted: No Dry / Scaly: No Maceration: No Moist: No Wound Preparation Ulcer Cleansing: Rinsed/Irrigated with Saline Topical Anesthetic Applied: Other: lidocaine 4%, Treatment Notes Wound #1 (Right, Anterior Lower Leg) 1. Cleansed with: Clean wound with Normal Saline 2. Anesthetic Topical Lidocaine 4% cream to wound bed prior to debridement 3. Peri-wound Care: Antifungal cream 4. Dressing Applied: Prisma Ag 5. Secondary Dressing Applied Gauze and Kerlix/Conform 7. Secured with Other (specify in notes) Notes wear own compression stocking Electronic Signature(s) TERRILYN, TYNER ([id number]) Signed: 04/15/2015 4:29:37 PM By: Rebecca Eaton RN, Sendra Entered By: Rebecca Eaton RN, Sendra on 04/15/2015 12:24:14 Beaudry, [name] ([id number]) -------------------------------------------------------------------------------- Vitals Details Patient Name: [name]. Date of Service: 04/15/2015 8:00 AM Medical Record Number: [id number] Patient Account Number: [id number] Date of Birth/Sex: [birth year] (82 y.o. Female) Treating RN: Macarthur Critchley Primary Care Physician: Benita Stabile Other Clinician: Referring Physician: Benita Stabile Treating Physician/Extender: Frann Rider in Treatment: 7 Vital  Signs Time Taken: 08:12 Temperature (F): 98.1 Height (in): 63 Pulse (bpm): 70 Weight (lbs): 180 Blood Pressure (mmHg): 140/63 Body Mass Index (BMI): 31.9 Reference Range: 80 - 120 mg / dl Electronic Signature(s) Signed: 04/15/2015 4:29:37 PM By: Rebecca Eaton RN, Sendra Entered By: Rebecca Eaton RN, Sendra on 04/15/2015 08:12:32

## 2015-04-22 ENCOUNTER — Encounter: Payer: Medicare HMO | Attending: Surgery | Admitting: Surgery

## 2015-04-22 DIAGNOSIS — C44722 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, including hip: Secondary | ICD-10-CM | POA: Diagnosis not present

## 2015-04-22 DIAGNOSIS — F17218 Nicotine dependence, cigarettes, with other nicotine-induced disorders: Secondary | ICD-10-CM | POA: Insufficient documentation

## 2015-04-22 DIAGNOSIS — L97212 Non-pressure chronic ulcer of right calf with fat layer exposed: Secondary | ICD-10-CM | POA: Insufficient documentation

## 2015-04-22 DIAGNOSIS — Y839 Surgical procedure, unspecified as the cause of abnormal reaction of the patient, or of later complication, without mention of misadventure at the time of the procedure: Secondary | ICD-10-CM | POA: Diagnosis not present

## 2015-04-22 DIAGNOSIS — T8131XA Disruption of external operation (surgical) wound, not elsewhere classified, initial encounter: Secondary | ICD-10-CM | POA: Insufficient documentation

## 2015-04-24 NOTE — Progress Notes (Signed)
ROGUE, PAUTLER ([id number]) Visit Report for 04/22/2015 Chief Complaint Document Details Patient Name: [name], [name]. Date of Service: 04/22/2015 12:45 PM Medical Record Number: [id number] Patient Account Number: [id number] Date of Birth/Sex: 07-25-1932 (82 y.o. Female) Treating RN: Ahmed Prima Primary Care Physician: Benita Stabile Other Clinician: Referring Physician: Benita Stabile Treating Physician/Extender: Frann Rider in Treatment: 8 Information Obtained from: Patient Chief Complaint Patient presents to the wound care center with open non-healing surgical wound(s) which has been there on the right lower extremity for about 4 weeks Electronic Signature(s) Signed: 04/22/2015 1:37:37 PM By: Christin Fudge MD, FACS Entered By: Christin Fudge on 04/22/2015 13:37:37 [place], [name] ([id number]) -------------------------------------------------------------------------------- HPI Details Patient Name: [name]. Date of Service: 04/22/2015 12:45 PM Medical Record Number: [id number] Patient Account Number: [id number] Date of Birth/Sex: 17-Jan-1933 (82 y.o. Female) Treating RN: Ahmed Prima Primary Care Physician: TATE, [facility] Other Clinician: Referring Physician: Benita Stabile Treating Physician/Extender: Frann Rider in Treatment: 8 History of Present Illness Location: right lower extremity Quality: Patient reports experiencing a dull pain to affected area(s). Severity: Patient states wound are getting worse. Duration: Patient has had the wound for < 4 weeks prior to presenting for treatment Timing: Pain in wound is Intermittent (comes and goes Context: The wound occurred when the patient habits sutures removed from a dermatology excision of a squamous cell carcinoma Modifying Factors: Other treatment(s) tried include: she is on doxycycline at the present time and prior to that had Keflex Associated Signs and Symptoms: Patient reports having difficulty standing for long  periods. HPI Description: 82 year old patient sent to [place] by her dermatologist Dr. Kirkland Hun for a decreased surgical wound of the right lower extremity where a excision and primary repair was done in early November. Pathology showed a squamous cell carcinoma with no residual tumor seen. Postoperatively the patient was put on antibiotics for a wound infection and initially she had Keflex given by her PCP. The patient was then switched to doxycycline by the dermatologist for a presumed MRSA. Sutures were removed 3 weeks later and there was partial dehiscence of the surgical wound with no purulent drainage. At the present time the patient says she has minimal pain but no pus drainage or fever. The patient is a smoker and smokes a packet of cigarettes for the last 65 years 03/01/2015 -- the patient is still on doxycycline and this was only started on Saturday. She continues to smoke about half pack of cigarettes a day and says she is working on quitting. She has an appointment to see her dermatologist this Wednesday. 03/25/2015 -- she is off antibiotics now and she feels she's been doing much better. She continues to smoke though and has been working on quitting. 04/08/2015 -- she had a lot of itching around the wound and has been scratching there quite significantly and has caused excoriation of the skin surrounding her main wound. 04/22/2015 -- the main wound has healed completely but above that she feels a suture protruding and wanted me to take a look at this Electronic Signature(s) Signed: 04/22/2015 1:38:04 PM By: Christin Fudge MD, FACS Entered By: Christin Fudge on 04/22/2015 13:38:04 [name], [name] ([id number]) -------------------------------------------------------------------------------- Physical Exam Details Patient Name: [name]. Date of Service: 04/22/2015 12:45 PM Medical Record Number: [id number] Patient Account Number: [id number] Date of Birth/Sex: 1932-04-21 (82 y.o.  Female) Treating RN: Ahmed Prima Primary Care Physician: Benita Stabile Other Clinician: Referring Physician: Benita Stabile Treating Physician/Extender: Frann Rider in Treatment: 8 Constitutional . Pulse  regular. Respirations normal and unlabored. Afebrile. . Eyes Nonicteric. Reactive to light. Ears, Nose, Mouth, and Throat Lips, teeth, and gums WNL.Marland Kitchen Moist mucosa without lesions. Neck supple and nontender. No palpable supraclavicular or cervical adenopathy. Normal sized without goiter. Respiratory WNL. No retractions.. Cardiovascular Pedal Pulses WNL. No clubbing, cyanosis or edema. Lymphatic No adneopathy. No adenopathy. No adenopathy. Musculoskeletal Adexa without tenderness or enlargement.. Digits and nails w/o clubbing, cyanosis, infection, petechiae, ischemia, or inflammatory conditions.. Integumentary (Hair, Skin) No suspicious lesions. No crepitus or fluctuance. No peri-wound warmth or erythema. No masses.Marland Kitchen Psychiatric Judgement and insight Intact.. No evidence of depression, anxiety, or agitation.. Notes the original wound has completely healed and has got a supple scar tissue. Just superior to this where she had a previous surgery she has got a suture from the subcutis tissue popping out through the skin. This was trimmed for her with a forceps and scissors Electronic Signature(s) Signed: 04/22/2015 1:38:49 PM By: Christin Fudge MD, FACS Entered By: Christin Fudge on 04/22/2015 13:38:49 [name], [name] ([id number]) -------------------------------------------------------------------------------- Physician Orders Details Patient Name: [name]. Date of Service: 04/22/2015 12:45 PM Medical Record Number: [id number] Patient Account Number: [id number] Date of Birth/Sex: 28-Aug-1932 (82 y.o. Female) Treating RN: Ahmed Prima Primary Care Physician: Benita Stabile Other Clinician: Referring Physician: Benita Stabile Treating Physician/Extender: Frann Rider in  Treatment: 8 Verbal / Phone Orders: Yes Clinician: Carolyne Fiscal, Debi Read Back and Verified: Yes Diagnosis Coding Discharge From [facility] Services o Discharge from [place] - keep area clean and dry Electronic Signature(s) Signed: 04/22/2015 3:59:20 PM By: Christin Fudge MD, FACS Signed: 04/22/2015 5:06:37 PM By: Alric Quan Entered By: Alric Quan on 04/22/2015 13:13:41 [place], [name] ([id number]) -------------------------------------------------------------------------------- Problem List Details Patient Name: MAELYNN, MORONEY. Date of Service: 04/22/2015 12:45 PM Medical Record Number: [id number] Patient Account Number: [id number] Date of Birth/Sex: Jun 18, 1932 (81 y.o. 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 Date of Service: 04/22/2015 12:45 PM Medical Record Number: [id number] Patient Account Number: [id number] Date of Birth/Sex: 04-08-1932 (82 y.o. Female) Treating RN: Ahmed Prima Primary Care Physician: TATE, [facility] Other Clinician: Referring  Physician: Benita Stabile Treating Physician/Extender: Frann Rider in Treatment: 8 Subjective Chief Complaint Information obtained from Patient Patient presents to the wound care center with open non-healing surgical wound(s) which has been there on the right lower extremity for about 4 weeks History of Present Illness (HPI) The following HPI elements were documented for the patient's wound: Location: right lower extremity Quality: Patient reports experiencing a dull pain to affected area(s). Severity: Patient states wound are getting worse. Duration: Patient has had the wound for < 4 weeks prior to presenting for treatment Timing: Pain in wound is Intermittent (comes and goes Context: The wound occurred when the patient habits sutures removed from a dermatology excision of a squamous cell carcinoma Modifying Factors: Other treatment(s)  tried include: she is on doxycycline at the present time and prior to that had Keflex Associated Signs and Symptoms: Patient reports having difficulty standing for long periods. 82 year old patient sent to [place] by her dermatologist Dr. Kirkland Hun for a decreased surgical wound of the right lower extremity where a excision and primary repair was done in early November. Pathology showed a squamous cell carcinoma with no residual tumor seen. Postoperatively the patient was put on antibiotics for a wound infection and initially she had Keflex given by her PCP. The patient was then switched to doxycycline by the dermatologist for a presumed MRSA. Sutures were removed 3 weeks later and there was partial dehiscence of the surgical wound with no purulent drainage. At the present time the patient says she has minimal pain but no pus drainage or fever. The patient is a smoker and smokes a packet of cigarettes for the last 65 years 03/01/2015 -- the patient is still on doxycycline and this was only started on Saturday. She continues to smoke about half pack of  cigarettes a day and says she is working on quitting. She has an appointment to see her dermatologist this Wednesday. 03/25/2015 -- she is off antibiotics now and she feels she's been doing much better. She continues to smoke though and has been working on quitting. 04/08/2015 -- she had a lot of itching around the wound and has been scratching there quite significantly and has caused excoriation of the skin surrounding her main wound. 04/22/2015 -- the main wound has healed completely but above that she feels a suture protruding and wanted me to take a look at this [name], Brianna R. ([id number]) Objective Constitutional Pulse regular. Respirations normal and unlabored. Afebrile. Vitals Time Taken: 12:48 PM, Height: 63 in, Weight: 180 lbs, BMI: 31.9, Temperature: 97.8 F, Pulse: 72 bpm, Respiratory Rate: 20 breaths/min, Blood Pressure: 150/63 mmHg. Eyes Nonicteric. Reactive to light. Ears, Nose, Mouth, and Throat Lips, teeth, and gums WNL.Marland Kitchen Moist mucosa without lesions. Neck supple and nontender. No palpable supraclavicular or cervical adenopathy. Normal sized without goiter. Respiratory WNL. No retractions.. Cardiovascular Pedal Pulses WNL. No clubbing, cyanosis or edema. Lymphatic No adneopathy. No adenopathy. No adenopathy. Musculoskeletal Adexa without tenderness or enlargement.. Digits and nails w/o clubbing, cyanosis, infection, petechiae, ischemia, or inflammatory conditions.Marland Kitchen Psychiatric Judgement and insight Intact.. No evidence of depression, anxiety, or agitation.. General Notes: the original wound has completely healed and has got a supple scar tissue. Just superior to this where she had a previous surgery she has got a suture from the subcutis tissue popping out through the skin. This was trimmed for her with a forceps and scissors Integumentary (Hair, Skin) No suspicious lesions. No crepitus or fluctuance. No peri-wound warmth or erythema. No masses.. Wound #1 status  is Open. Original cause of wound was Surgical Injury. The wound is located on the Right,Anterior Lower Leg. The wound measures 0cm length x 0cm width x 0cm depth; 0cm^2 area and 0cm^3 volume. The wound is limited to skin breakdown. There is no tunneling or undermining noted. There is a small amount of serous drainage noted. The wound margin is distinct with the outline attached to the wound base. There is no granulation within the wound bed. There is no necrotic tissue within the wound [name], Brianna R. ([id number]) bed. The periwound skin appearance did not exhibit: Callus, Crepitus, Excoriation, Fluctuance, Friable, Induration, Localized Edema, Rash, Scarring, Dry/Scaly, Maceration, Moist, Atrophie Blanche, Cyanosis, Ecchymosis, Hemosiderin Staining, Mottled, Pallor, Rubor, Erythema. Periwound temperature was noted as  No Abnormality. Assessment Active Problems ICD-10 C44.722 - 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, including hip T81.31XA - Disruption of external operation (surgical) wound, not elsewhere classified, initial encounter L97.212 - Non-pressure chronic ulcer of right calf with fat layer exposed F17.218 - Nicotine dependence, cigarettes, with other nicotine-induced disorders The patient's wound is completely healed and I have discharged her from the wound care services and will see her back as needed Plan Discharge From [facility] Services: Discharge from [place] - keep area clean and dry The patient's wound is completely healed and I have discharged her from the wound care services and will see her back as needed Electronic Signature(s) Signed: 04/22/2015 1:39:17 PM By: Christin Fudge MD, FACS Entered By: Christin Fudge on 04/22/2015 13:39:17 [name], [name] ([id number]) -------------------------------------------------------------------------------- SuperBill Details Patient Name: [name]. Date of Service: 04/22/2015 Medical Record Number:  [id number] Patient Account Number: [id number] Date of Birth/Sex: [birth year] (82 y.o. Female) Treating RN: Carolyne Fiscal, Debi Primary Care Physician: TATE, [facility] Other Clinician: Referring Physician: Benita Stabile Treating Physician/Extender: Frann Rider in Treatment: 8 Diagnosis Coding ICD-10 Codes Code Description [id number] 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, including hip Disruption of external operation (surgical) wound, not elsewhere classified, initial T81.31XA encounter L97.212 Non-pressure chronic ulcer of right calf with fat layer exposed F17.218 Nicotine dependence, cigarettes, with other nicotine-induced disorders Facility Procedures CPT4 Code: [id number] Description: 19379 - WOUND CARE VISIT-LEV 2 EST PT Modifier: Quantity: 1 Physician Procedures CPT4: Description Modifier Quantity Code [id number] 53299 - WC PHYS LEVEL 2 - EST PT 1 ICD-10 Description Diagnosis C44.722 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, including hip T81.31XA Disruption of external operation (surgical) wound, not  elsewhere classified, initial encounter L97.212 Non-pressure chronic ulcer of right calf with fat layer exposed Electronic Signature(s) Signed: 04/23/2015 4:33:45 PM By: Christin Fudge MD, FACS Previous Signature: 04/22/2015 1:39:31 PM Version By: Christin Fudge MD, FACS Entered By: Alric Quan on 04/23/2015 09:31:34

## 2015-04-24 NOTE — Progress Notes (Signed)
RILY, NICKEY ([id number]) Visit Report for 04/22/2015 Arrival Information Details Patient Name: [name], [name]. Date of Service: 04/22/2015 12:45 PM Medical Record Number: [id number] Patient Account Number: [id number] Date of Birth/Sex: September 26, 1932 (83 y.o. Female) Treating RN: Ahmed Prima Primary Care Physician: TATE, [facility] Other Clinician: Referring Physician: Benita Stabile Treating Physician/Extender: Frann Rider in Treatment: 8 Visit Information History Since Last Visit All ordered tests and consults were completed: No Patient Arrived: Ambulatory Added or deleted any medications: No Arrival Time: 12:48 Any new allergies or adverse reactions: No Accompanied By: self Had a fall or experienced change in No Transfer Assistance: None activities of daily living that may affect Patient Identification Verified: Yes risk of falls: Secondary Verification Process Yes Signs or symptoms of abuse/neglect since last No Completed: visito Patient Requires Transmission- Yes Hospitalized since last visit: No Based Precautions: Pain Present Now: No Transmission-Based Precautions: Contact MRSA Patient Has Alerts: Yes Electronic Signature(s) Signed: 04/22/2015 5:06:37 PM By: Alric Quan Entered By: Alric Quan on 04/22/2015 12:48:44 Stmarie, [name] ([id number]) -------------------------------------------------------------------------------- Clinic Level of Care Assessment Details Patient Name: [name]. Date of Service: 04/22/2015 12:45 PM Medical Record Number: [id number] Patient Account Number: [id number] Date of Birth/Sex: [birth year] (82 y.o. Female) Treating RN: Carolyne Fiscal, Debi Primary Care Physician: TATE, [facility] Other Clinician: Referring Physician: Benita Stabile Treating Physician/Extender: Frann Rider in Treatment: 8 Clinic Level of Care Assessment Items TOOL 4 Quantity Score '[]'$  - Use when only an EandM is performed on FOLLOW-UP visit 0 ASSESSMENTS -  Nursing Assessment / Reassessment '[]'$  - Reassessment of Co-morbidities (includes updates in patient status) 0 X - Reassessment of Adherence to Treatment Plan 1 5 ASSESSMENTS - Wound and Skin Assessment / Reassessment X - Simple Wound Assessment / Reassessment - one wound 1 5 '[]'$  - Complex Wound Assessment / Reassessment - multiple wounds 0 '[]'$  - Dermatologic / Skin Assessment (not related to wound area) 0 ASSESSMENTS - Focused Assessment '[]'$  - Circumferential Edema Measurements - multi extremities 0 '[]'$  - Nutritional Assessment / Counseling / Intervention 0 '[]'$  - Lower Extremity Assessment (monofilament, tuning fork, pulses) 0 '[]'$  - Peripheral Arterial Disease Assessment (using hand held doppler) 0 ASSESSMENTS - Ostomy and/or Continence Assessment and Care '[]'$  - Incontinence Assessment and Management 0 '[]'$  - Ostomy Care Assessment and Management (repouching, etc.) 0 PROCESS - Coordination of Care X - Simple Patient / Family Education for ongoing care 1 15 '[]'$  - Complex (extensive) Patient / Family Education for ongoing care 0 '[]'$  - Staff obtains Programmer, systems, Records, Test Results / Process Orders 0 '[]'$  - Staff telephones HHA, Nursing Homes / Clarify orders / etc 0 '[]'$  - Routine Transfer to another Facility (non-emergent condition) 0 Budai, Rorey R. ([id number]) '[]'$  - Routine Hospital Admission (non-emergent condition) 0 '[]'$  - New Admissions / Biomedical engineer / Ordering NPWT, Apligraf, etc. 0 '[]'$  - Emergency Hospital Admission (emergent condition) 0 '[]'$  - Simple Discharge Coordination 0 X - Complex (extensive) Discharge Coordination 1 15 PROCESS - Special Needs '[]'$  - Pediatric / Minor Patient Management 0 '[]'$  - Isolation Patient Management 0 '[]'$  - Hearing / Language / Visual special needs 0 '[]'$  - Assessment of Community assistance (transportation, D/C planning, etc.) 0 '[]'$  - Additional assistance / Altered mentation 0 '[]'$  - Support Surface(s) Assessment (bed, cushion, seat, etc.) 0 INTERVENTIONS -  Wound Cleansing / Measurement X - Simple Wound Cleansing - one wound 1 5 '[]'$  - Complex Wound Cleansing - multiple wounds 0 X - Wound Imaging (photographs - any number of wounds)  1 5 '[]'$  - Wound Tracing (instead of photographs) 0 X - Simple Wound Measurement - one wound 1 5 '[]'$  - Complex Wound Measurement - multiple wounds 0 INTERVENTIONS - Wound Dressings X - Small Wound Dressing one or multiple wounds 1 10 '[]'$  - Medium Wound Dressing one or multiple wounds 0 '[]'$  - Large Wound Dressing one or multiple wounds 0 '[]'$  - Application of Medications - topical 0 '[]'$  - Application of Medications - injection 0 INTERVENTIONS - Miscellaneous '[]'$  - External ear exam 0 Narula, Saddie R. ([id number]) '[]'$  - Specimen Collection (cultures, biopsies, blood, body fluids, etc.) 0 '[]'$  - Specimen(s) / Culture(s) sent or taken to Lab for analysis 0 '[]'$  - Patient Transfer (multiple staff / Harrel Lemon Lift / Similar devices) 0 '[]'$  - Simple Staple / Suture removal (25 or less) 0 '[]'$  - Complex Staple / Suture removal (26 or more) 0 '[]'$  - Hypo / Hyperglycemic Management (close monitor of Blood Glucose) 0 '[]'$  - Ankle / Brachial Index (ABI) - do not check if billed separately 0 X - Vital Signs 1 5 Has the patient been seen at the hospital within the last three years: Yes Total Score: 70 Level Of Care: New/Established - Level 2 Electronic Signature(s) Unsigned Entered By: Alric Quan on 04/23/2015 09:31:24 Signature(s): Date(s): Quarry, [name] ([id number]) -------------------------------------------------------------------------------- Encounter Discharge Information Details Patient Name: Brianna, Solomon. Date of Service: 04/22/2015 12:45 PM Medical Record Number: [id number] Patient Account Number: [id number] Date of Birth/Sex: 1932/03/24 (82 y.o. Female) Treating RN: Ahmed Prima Primary Care Physician: TATE, [facility] Other Clinician: Referring Physician: Benita Stabile Treating Physician/Extender: Frann Rider in  Treatment: 8 Encounter Discharge Information Items Discharge Pain Level: 0 Discharge Condition: Stable Ambulatory Status: Ambulatory Discharge Destination: Home Transportation: Private Auto Accompanied By: self Schedule Follow-up Appointment: No Medication Reconciliation completed and provided to Patient/Care Yes Zakiyyah Savannah: Provided on Clinical Summary of Care: 04/22/2015 Form Type Recipient Paper Patient JW Electronic Signature(s) Signed: 04/22/2015 1:18:08 PM By: Ruthine Dose Entered By: Ruthine Dose on 04/22/2015 13:18:08 Ambs, [name] ([id number]) -------------------------------------------------------------------------------- Lower Extremity Assessment Details Patient Name: [name]. Date of Service: 04/22/2015 12:45 PM Medical Record Number: [id number] Patient Account Number: [id number] Date of Birth/Sex: May 31, 1932 (82 y.o. Female) Treating RN: Ahmed Prima Primary Care Physician: TATE, [facility] Other Clinician: Referring Physician: Benita Stabile Treating Physician/Extender: Frann Rider in Treatment: 8 Edema Assessment Assessed: [Left: No] [Right: No] E[Left: dema] [Right: :] Calf Left: Right: Point of Measurement: cm From Medial Instep cm 35 cm Ankle Left: Right: Point of Measurement: cm From Medial Instep cm 22 cm Vascular Assessment Pulses: Posterior Tibial Dorsalis Pedis Palpable: [Right:Yes] Extremity colors, hair growth, and conditions: Temperature of Extremity: [Right:Warm] Capillary Refill: [Right:< 3 seconds] Toe Nail Assessment Left: Right: Thick: No Discolored: No Deformed: No Improper Length and Hygiene: No Electronic Signature(s) Signed: 04/22/2015 5:06:37 PM By: Alric Quan Entered By: Alric Quan on 04/22/2015 12:53:23 Asbury, [name] ([id number]) -------------------------------------------------------------------------------- Multi Wound Chart Details Patient Name: [name]. Date of Service: 04/22/2015 12:45  PM Medical Record Number: [id number] Patient Account Number: [id number] Date of Birth/Sex: [birth year] (82 y.o. Female) Treating RN: Ahmed Prima Primary Care Physician: TATE, [facility] Other Clinician: Referring Physician: Benita Stabile Treating Physician/Extender: Frann Rider in Treatment: 8 Vital Signs Height(in): 63 Pulse(bpm): 72 Weight(lbs): 180 Blood Pressure 150/63 (mmHg): Body Mass Index(BMI): 32 Temperature(F): 97.8 Respiratory Rate 20 (breaths/min): Photos: [1:No Photos] [N/A:N/A] Wound Location: [1:Right Lower Leg - Anterior N/A] Wounding Event: [1:Surgical Injury] [N/A:N/A] Primary Etiology: [1:Malignant Wound] [N/A:N/A] Comorbid  History: [1:Cataracts, Osteoarthritis N/A] Date Acquired: [1:01/26/2015] [N/A:N/A] Weeks of Treatment: [1:8] [N/A:N/A] Wound Status: [1:Open] [N/A:N/A] Measurements L x W x D 0x0x0 [N/A:N/A] (cm) Area (cm) : [1:0] [N/A:N/A] Volume (cm) : [1:0] [N/A:N/A] % Reduction in Area: [1:100.00%] [N/A:N/A] % Reduction in Volume: 100.00% [N/A:N/A] Classification: [1:Full Thickness Without Exposed Support Structures] [N/A:N/A] Exudate Amount: [1:Small] [N/A:N/A] Exudate Type: [1:Serous] [N/A:N/A] Exudate Color: [1:amber] [N/A:N/A] Wound Margin: [1:Distinct, outline attached N/A] Granulation Amount: [1:None Present (0%)] [N/A:N/A] Necrotic Amount: [1:None Present (0%)] [N/A:N/A] Exposed Structures: [1:Fascia: No Fat: No Tendon: No Muscle: No Joint: No Bone: No] [N/A:N/A] Limited to Skin Breakdown Epithelialization: Large (67-100%) N/A N/A Periwound Skin Texture: Edema: No N/A N/A Excoriation: No Induration: No Callus: No Crepitus: No Fluctuance: No Friable: No Rash: No Scarring: No Periwound Skin Maceration: No N/A N/A Moisture: Moist: No Dry/Scaly: No Periwound Skin Color: Atrophie Blanche: No N/A N/A Cyanosis: No Ecchymosis: No Erythema: No Hemosiderin Staining: No Mottled: No Pallor: No Rubor: No Temperature: No  Abnormality N/A N/A Tenderness on No N/A N/A Palpation: Wound Preparation: Ulcer Cleansing: N/A N/A Rinsed/Irrigated with Saline Topical Anesthetic Applied: Other: lidocaine 4% Treatment Notes Electronic Signature(s) Signed: 04/22/2015 5:06:37 PM By: Alric Quan Entered By: Alric Quan on 04/22/2015 13:13:12 Beaudin, [name] ([id number]) -------------------------------------------------------------------------------- [place] Details Patient Name: [name]. Date of Service: 04/22/2015 12:45 PM Medical Record Number: [id number] Patient Account Number: [id number] Date of Birth/Sex: [birth year] (82 y.o. Female) Treating RN: Ahmed Prima Primary Care Physician: Benita Stabile Other Clinician: Referring Physician: Benita Stabile Treating Physician/Extender: Frann Rider in Treatment: 8 Active Inactive Electronic Signature(s) Signed: 04/22/2015 5:06:37 PM By: Alric Quan Entered By: Alric Quan on 04/22/2015 13:12:59 Schoenfeldt, [name] ([id number]) -------------------------------------------------------------------------------- Pain Assessment Details Patient Name: [name]. Date of Service: 04/22/2015 12:45 PM Medical Record Number: [id number] Patient Account Number: [id number] Date of Birth/Sex: 02/08/1933 (82 y.o. Female) Treating RN: Ahmed Prima Primary Care Physician: Benita Stabile Other Clinician: Referring Physician: Benita Stabile Treating Physician/Extender: Frann Rider in Treatment: 8 Active Problems Location of Pain Severity and Description of Pain Patient Has Paino No Site Locations Pain Management and Medication Current Pain Management: Electronic Signature(s) Signed: 04/22/2015 5:06:37 PM By: Alric Quan Entered By: Alric Quan on 04/22/2015 12:48:50 Hayashi, [name] ([id number]) -------------------------------------------------------------------------------- Patient/Caregiver Education Details Patient  Name: [name]. Date of Service: 04/22/2015 12:45 PM Medical Record Number: [id number] Patient Account Number: [id number] Date of Birth/Gender: 10-08-1932 (82 y.o. Female) Treating RN: Carolyne Fiscal, Debi Primary Care Physician: TATE, [facility] Other Clinician: Referring Physician: Benita Stabile Treating Physician/Extender: Frann Rider in Treatment: 8 Education Assessment Education Provided To: Patient Education Topics Provided Wound/Skin Impairment: Handouts: Other: keep area clean and dry Methods: Demonstration, Explain/Verbal Responses: State content correctly Electronic Signature(s) Signed: 04/22/2015 5:06:37 PM By: Alric Quan Entered By: Alric Quan on 04/22/2015 13:14:34 Mcgarvey, [name] ([id number]) -------------------------------------------------------------------------------- Wound Assessment Details Patient Name: [name]. Date of Service: 04/22/2015 12:45 PM Medical Record Number: [id number] Patient Account Number: [id number] Date of Birth/Sex: 1933/01/16 (82 y.o. Female) Treating RN: Ahmed Prima Primary Care Physician: TATE, [facility] Other Clinician: Referring Physician: Benita Stabile Treating Physician/Extender: Frann Rider in Treatment: 8 Wound Status Wound Number: 1 Primary Etiology: Malignant Wound Wound Location: Right Lower Leg - Anterior Wound Status: Open Wounding Event: Surgical Injury Comorbid History: Cataracts, Osteoarthritis Date Acquired: 01/26/2015 Weeks Of Treatment: 8 Clustered Wound: No Photos Photo Uploaded By: Alric Quan on 04/22/2015 15:51:45 Wound Measurements Length: (cm) 0 % Reduction Width: (cm) 0 % Reduction Depth: (cm)  0 Epithelializ Area: (cm) 0 Tunneling: Volume: (cm) 0 Undermining in Area: 100% in Volume: 100% ation: Large (67-100%) No : No Wound Description Full Thickness Without Exposed Foul Odor Af Classification: Support Structures Wound Margin: Distinct, outline  attached Exudate Small Amount: Exudate Type: Serous Exudate Color: amber ter Cleansing: No Wound Bed Granulation Amount: None Present (0%) Exposed Structure Necrotic Amount: None Present (0%) Fascia Exposed: No Fat Layer Exposed: No Dizdarevic, Anyssa R. ([id number]) Tendon Exposed: No Muscle Exposed: No Joint Exposed: No Bone Exposed: No Limited to Skin Breakdown Periwound Skin Texture Texture Color No Abnormalities Noted: No No Abnormalities Noted: No Callus: No Atrophie Blanche: No Crepitus: No Cyanosis: No Excoriation: No Ecchymosis: No Fluctuance: No Erythema: No Friable: No Hemosiderin Staining: No Induration: No Mottled: No Localized Edema: No Pallor: No Rash: No Rubor: No Scarring: No Temperature / Pain Moisture Temperature: No Abnormality No Abnormalities Noted: No Dry / Scaly: No Maceration: No Moist: No Wound Preparation Ulcer Cleansing: Rinsed/Irrigated with Saline Topical Anesthetic Applied: Other: lidocaine 4%, Electronic Signature(s) Signed: 04/22/2015 5:06:37 PM By: Alric Quan Entered By: Alric Quan on 04/22/2015 13:11:37 Lipton, [name] ([id number]) -------------------------------------------------------------------------------- Vitals Details Patient Name: [name]. Date of Service: 04/22/2015 12:45 PM Medical Record Number: [id number] Patient Account Number: [id number] Date of Birth/Sex: 1932/05/07 (82 y.o. Female) Treating RN: Carolyne Fiscal, Debi Primary Care Physician: TATE, [facility] Other Clinician: Referring Physician: Benita Stabile Treating Physician/Extender: Frann Rider in Treatment: 8 Vital Signs Time Taken: 12:48 Temperature (F): 97.8 Height (in): 63 Pulse (bpm): 72 Weight (lbs): 180 Respiratory Rate (breaths/min): 20 Body Mass Index (BMI): 31.9 Blood Pressure (mmHg): 150/63 Reference Range: 80 - 120 mg / dl Electronic Signature(s) Signed: 04/22/2015 5:06:37 PM By: Alric Quan Entered By: Alric Quan  on 04/22/2015 12:51:44

## 2015-06-15 ENCOUNTER — Ambulatory Visit
Admission: RE | Admit: 2015-06-15 | Discharge: 2015-06-15 | Disposition: A | Discharge status: Home / Self Care | Payer: Medicare HMO | Source: Ambulatory Visit | Attending: Internal Medicine | Admitting: Internal Medicine

## 2015-06-15 ENCOUNTER — Other Ambulatory Visit: Payer: Self-pay | Admitting: Internal Medicine

## 2015-06-15 DIAGNOSIS — M545 Low back pain: Secondary | ICD-10-CM | POA: Insufficient documentation

## 2015-06-15 DIAGNOSIS — M5136 Other intervertebral disc degeneration, lumbar region: Secondary | ICD-10-CM | POA: Diagnosis not present

## 2015-07-27 ENCOUNTER — Other Ambulatory Visit: Payer: Self-pay | Admitting: Physical Medicine and Rehabilitation

## 2015-07-27 DIAGNOSIS — M5417 Radiculopathy, lumbosacral region: Secondary | ICD-10-CM

## 2015-08-30 ENCOUNTER — Ambulatory Visit
Admission: RE | Admit: 2015-08-30 | Discharge: 2015-08-30 | Disposition: A | Discharge status: Home / Self Care | Payer: Medicare HMO | Source: Ambulatory Visit | Attending: Physical Medicine and Rehabilitation | Admitting: Physical Medicine and Rehabilitation

## 2015-08-30 DIAGNOSIS — M5126 Other intervertebral disc displacement, lumbar region: Secondary | ICD-10-CM | POA: Diagnosis not present

## 2015-08-30 DIAGNOSIS — M5417 Radiculopathy, lumbosacral region: Secondary | ICD-10-CM

## 2015-08-30 DIAGNOSIS — M79604 Pain in right leg: Secondary | ICD-10-CM | POA: Insufficient documentation

## 2015-08-30 DIAGNOSIS — M545 Low back pain: Secondary | ICD-10-CM | POA: Diagnosis present

## 2015-08-30 DIAGNOSIS — M5416 Radiculopathy, lumbar region: Secondary | ICD-10-CM | POA: Diagnosis not present

## 2015-08-30 DIAGNOSIS — M5136 Other intervertebral disc degeneration, lumbar region: Secondary | ICD-10-CM | POA: Diagnosis not present

## 2016-10-18 DIAGNOSIS — I480 Paroxysmal atrial fibrillation: Secondary | ICD-10-CM

## 2016-10-18 HISTORY — DX: Paroxysmal atrial fibrillation: I48.0

## 2016-11-12 ENCOUNTER — Emergency Department: Payer: Medicare HMO

## 2016-11-12 ENCOUNTER — Inpatient Hospital Stay
Admission: EM | Admit: 2016-11-12 | Discharge: 2016-11-16 | DRG: 188 | Disposition: A | Discharge status: Home / Self Care | Payer: Medicare HMO | Attending: Internal Medicine | Admitting: Internal Medicine

## 2016-11-12 DIAGNOSIS — R0600 Dyspnea, unspecified: Secondary | ICD-10-CM | POA: Diagnosis not present

## 2016-11-12 DIAGNOSIS — J449 Chronic obstructive pulmonary disease, unspecified: Secondary | ICD-10-CM | POA: Diagnosis present

## 2016-11-12 DIAGNOSIS — F17210 Nicotine dependence, cigarettes, uncomplicated: Secondary | ICD-10-CM | POA: Diagnosis not present

## 2016-11-12 DIAGNOSIS — Z8711 Personal history of peptic ulcer disease: Secondary | ICD-10-CM

## 2016-11-12 DIAGNOSIS — I509 Heart failure, unspecified: Secondary | ICD-10-CM | POA: Diagnosis present

## 2016-11-12 DIAGNOSIS — R0602 Shortness of breath: Secondary | ICD-10-CM | POA: Diagnosis present

## 2016-11-12 DIAGNOSIS — R918 Other nonspecific abnormal finding of lung field: Secondary | ICD-10-CM | POA: Diagnosis not present

## 2016-11-12 DIAGNOSIS — R0603 Acute respiratory distress: Secondary | ICD-10-CM | POA: Diagnosis present

## 2016-11-12 DIAGNOSIS — I959 Hypotension, unspecified: Secondary | ICD-10-CM | POA: Diagnosis not present

## 2016-11-12 DIAGNOSIS — K589 Irritable bowel syndrome without diarrhea: Secondary | ICD-10-CM | POA: Diagnosis present

## 2016-11-12 DIAGNOSIS — C3490 Malignant neoplasm of unspecified part of unspecified bronchus or lung: Secondary | ICD-10-CM | POA: Diagnosis not present

## 2016-11-12 DIAGNOSIS — Z8249 Family history of ischemic heart disease and other diseases of the circulatory system: Secondary | ICD-10-CM

## 2016-11-12 DIAGNOSIS — E876 Hypokalemia: Secondary | ICD-10-CM | POA: Diagnosis present

## 2016-11-12 DIAGNOSIS — I481 Persistent atrial fibrillation: Secondary | ICD-10-CM | POA: Diagnosis not present

## 2016-11-12 DIAGNOSIS — I4891 Unspecified atrial fibrillation: Secondary | ICD-10-CM | POA: Diagnosis not present

## 2016-11-12 DIAGNOSIS — J90 Pleural effusion, not elsewhere classified: Secondary | ICD-10-CM | POA: Diagnosis present

## 2016-11-12 DIAGNOSIS — Z9049 Acquired absence of other specified parts of digestive tract: Secondary | ICD-10-CM | POA: Diagnosis not present

## 2016-11-12 DIAGNOSIS — R079 Chest pain, unspecified: Secondary | ICD-10-CM | POA: Diagnosis not present

## 2016-11-12 HISTORY — DX: Gastric ulcer, unspecified as acute or chronic, without hemorrhage or perforation: K25.9

## 2016-11-12 HISTORY — DX: Chronic obstructive pulmonary disease, unspecified: J44.9

## 2016-11-12 LAB — CBC WITH DIFFERENTIAL/PLATELET
Basophils Absolute: 0 10*3/uL (ref 0–0.1)
Basophils Relative: 1 %
EOS ABS: 0.3 10*3/uL (ref 0–0.7)
EOS PCT: 4 %
HCT: 35.9 % (ref 35.0–47.0)
Hemoglobin: 12.2 g/dL (ref 12.0–16.0)
LYMPHS ABS: 1.4 10*3/uL (ref 1.0–3.6)
LYMPHS PCT: 19 %
MCH: 27.9 pg (ref 26.0–34.0)
MCHC: 34 g/dL (ref 32.0–36.0)
MCV: 82 fL (ref 80.0–100.0)
MONO ABS: 0.6 10*3/uL (ref 0.2–0.9)
Monocytes Relative: 8 %
Neutro Abs: 5 10*3/uL (ref 1.4–6.5)
Neutrophils Relative %: 68 %
PLATELETS: 178 10*3/uL (ref 150–440)
RBC: 4.38 MIL/uL (ref 3.80–5.20)
RDW: 14.7 % — AB (ref 11.5–14.5)
WBC: 7.2 10*3/uL (ref 3.6–11.0)

## 2016-11-12 LAB — COMPREHENSIVE METABOLIC PANEL
ALK PHOS: 74 U/L (ref 38–126)
ALT: 11 U/L — AB (ref 14–54)
ANION GAP: 8 (ref 5–15)
AST: 18 U/L (ref 15–41)
Albumin: 3.2 g/dL — ABNORMAL LOW (ref 3.5–5.0)
BILIRUBIN TOTAL: 0.6 mg/dL (ref 0.3–1.2)
BUN: 11 mg/dL (ref 6–20)
CALCIUM: 8.8 mg/dL — AB (ref 8.9–10.3)
CO2: 28 mmol/L (ref 22–32)
CREATININE: 0.64 mg/dL (ref 0.44–1.00)
Chloride: 108 mmol/L (ref 101–111)
GFR calc non Af Amer: >60 mL/min (ref >60)
GLUCOSE: 102 mg/dL — AB (ref 65–99)
Potassium: 3.1 mmol/L — ABNORMAL LOW (ref 3.5–5.1)
SODIUM: 144 mmol/L (ref 135–145)
TOTAL PROTEIN: 6.1 g/dL — AB (ref 6.5–8.1)

## 2016-11-12 LAB — PROTIME-INR
INR: 1
Prothrombin Time: 13.2 seconds (ref 11.4–15.2)

## 2016-11-12 LAB — TROPONIN I: Troponin I: <0.03 ng/mL (ref <0.03)

## 2016-11-12 LAB — APTT: aPTT: 28 seconds (ref 24–36)

## 2016-11-12 LAB — BRAIN NATRIURETIC PEPTIDE: B Natriuretic Peptide: 88 pg/mL (ref 0.0–100.0)

## 2016-11-12 LAB — FIBRIN DERIVATIVES D-DIMER (ARMC ONLY): FIBRIN DERIVATIVES D-DIMER (ARMC): 1735.5 — AB (ref 0.00–499.00)

## 2016-11-12 LAB — MRSA PCR SCREENING: MRSA BY PCR: NEGATIVE

## 2016-11-12 LAB — TSH: TSH: 0.422 u[IU]/mL (ref 0.350–4.500)

## 2016-11-12 MED ORDER — NICOTINE 21 MG/24HR TD PT24
21.0000 mg | MEDICATED_PATCH | Freq: Every day | TRANSDERMAL | Status: DC
  Administered 2016-11-12 – 2016-11-16 (×5): 21 mg via TRANSDERMAL
  Filled 2016-11-12 (×5): qty 1

## 2016-11-12 MED ORDER — IPRATROPIUM-ALBUTEROL 0.5-2.5 (3) MG/3ML IN SOLN
3.0000 mL | Freq: Four times a day (QID) | RESPIRATORY_TRACT | Status: DC
  Administered 2016-11-12 – 2016-11-14 (×8): 3 mL via RESPIRATORY_TRACT
  Filled 2016-11-12 (×9): qty 3

## 2016-11-12 MED ORDER — LORATADINE 10 MG PO TABS
10.0000 mg | ORAL_TABLET | Freq: Every day | ORAL | Status: DC
  Administered 2016-11-13 – 2016-11-15 (×3): 10 mg via ORAL
  Filled 2016-11-12 (×4): qty 1

## 2016-11-12 MED ORDER — ONDANSETRON HCL 4 MG/2ML IJ SOLN: 4.0000 mg | Freq: Four times a day (QID) | INTRAMUSCULAR | Status: DC | PRN

## 2016-11-12 MED ORDER — ACETAMINOPHEN 325 MG PO TABS
650.0000 mg | ORAL_TABLET | Freq: Four times a day (QID) | ORAL | Status: DC | PRN
  Administered 2016-11-12 – 2016-11-16 (×5): 650 mg via ORAL
  Filled 2016-11-12 (×5): qty 2

## 2016-11-12 MED ORDER — GUAIFENESIN ER 600 MG PO TB12
600.0000 mg | ORAL_TABLET | Freq: Two times a day (BID) | ORAL | Status: DC
  Administered 2016-11-12 – 2016-11-16 (×8): 600 mg via ORAL
  Filled 2016-11-12 (×8): qty 1

## 2016-11-12 MED ORDER — RAMELTEON 8 MG PO TABS
8.0000 mg | ORAL_TABLET | Freq: Every day | ORAL | Status: DC
  Administered 2016-11-12 – 2016-11-15 (×4): 8 mg via ORAL
  Filled 2016-11-12 (×8): qty 1

## 2016-11-12 MED ORDER — VITAMIN D 1000 UNITS PO TABS
1000.0000 [IU] | ORAL_TABLET | Freq: Every day | ORAL | Status: DC
  Administered 2016-11-13 – 2016-11-16 (×4): 1000 [IU] via ORAL
  Filled 2016-11-12 (×4): qty 1

## 2016-11-12 MED ORDER — ACETAMINOPHEN 650 MG RE SUPP: 650.0000 mg | Freq: Four times a day (QID) | RECTAL | Status: DC | PRN

## 2016-11-12 MED ORDER — SODIUM CHLORIDE 0.9 % IV SOLN: 250.0000 mL | INTRAVENOUS | Status: DC | PRN

## 2016-11-12 MED ORDER — POTASSIUM CHLORIDE CRYS ER 20 MEQ PO TBCR
40.0000 meq | EXTENDED_RELEASE_TABLET | Freq: Once | ORAL | Status: AC
  Administered 2016-11-12: 40 meq via ORAL
  Filled 2016-11-12: qty 2

## 2016-11-12 MED ORDER — METHYLPREDNISOLONE SODIUM SUCC 125 MG IJ SOLR
60.0000 mg | Freq: Four times a day (QID) | INTRAMUSCULAR | Status: DC
  Administered 2016-11-12 – 2016-11-13 (×4): 60 mg via INTRAVENOUS
  Filled 2016-11-12 (×4): qty 2

## 2016-11-12 MED ORDER — ONDANSETRON HCL 4 MG PO TABS: 4.0000 mg | ORAL_TABLET | Freq: Four times a day (QID) | ORAL | Status: DC | PRN

## 2016-11-12 MED ORDER — METHYLPREDNISOLONE SODIUM SUCC 125 MG IJ SOLR
INTRAMUSCULAR | Status: AC
  Administered 2016-11-12: 125 mg via INTRAVENOUS
  Filled 2016-11-12: qty 2

## 2016-11-12 MED ORDER — SODIUM CHLORIDE 0.9% FLUSH
3.0000 mL | Freq: Two times a day (BID) | INTRAVENOUS | Status: DC
  Administered 2016-11-12 – 2016-11-16 (×8): 3 mL via INTRAVENOUS

## 2016-11-12 MED ORDER — IPRATROPIUM-ALBUTEROL 0.5-2.5 (3) MG/3ML IN SOLN
3.0000 mL | Freq: Once | RESPIRATORY_TRACT | Status: AC
  Administered 2016-11-12: 3 mL via RESPIRATORY_TRACT
  Filled 2016-11-12: qty 3

## 2016-11-12 MED ORDER — SODIUM CHLORIDE 0.9% FLUSH: 3.0000 mL | INTRAVENOUS | Status: DC | PRN

## 2016-11-12 MED ORDER — METHYLPREDNISOLONE SODIUM SUCC 125 MG IJ SOLR
125.0000 mg | Freq: Once | INTRAMUSCULAR | Status: AC
  Administered 2016-11-12 (×2): 125 mg via INTRAVENOUS

## 2016-11-12 MED ORDER — PANTOPRAZOLE SODIUM 40 MG PO TBEC
40.0000 mg | DELAYED_RELEASE_TABLET | Freq: Every day | ORAL | Status: DC
  Administered 2016-11-13 – 2016-11-16 (×4): 40 mg via ORAL
  Filled 2016-11-12 (×4): qty 1

## 2016-11-12 NOTE — H&P (Signed)
[place] [facility] [place] NAME: [name]    MR#:  [id number]  DATE OF BIRTH:  Mar 23, 1932  DATE OF ADMISSION:  11/12/2016  PRIMARY CARE PHYSICIAN: Albina Billet, MD   REQUESTING/REFERRING PHYSICIAN: Corinna Capra md  CHIEF COMPLAINT:   Chief Complaint  Patient presents with  . Chest Pain    HISTORY OF PRESENT ILLNESS: [name]  is a 83 y.o. female with a known history of  COPD and gastric ulcer presenting with chest pain and shortness of breath. She reports that her symptoms started since yesterday. She states that it feels like heavy pressure on the chest and is feeling short of breath. Patient also has had a dry cough. Unable to bring up anything. Has not had any fevers or chills. Denies any nausea vomiting or diarrhea. No weight loss or weight gain.     PAST MEDICAL HISTORY:   Past Medical History:  Diagnosis Date  . COPD (chronic obstructive pulmonary disease) ([place])   . GU (gastric peptic ulcer)     PAST SURGICAL HISTORY:  Past Surgical History:  Procedure Laterality Date  . CHOLECYSTECTOMY      SOCIAL HISTORY:  Social History  Substance Use Topics  . Smoking status: Current Every Day Smoker    Packs/day: 2.00  . Smokeless tobacco: Never Used  . Alcohol use No    FAMILY HISTORY:  Family History  Problem Relation Age of Onset  . Hypertension Father     DRUG ALLERGIES:  Allergies  Allergen Reactions  . Sulfa Antibiotics     REVIEW OF SYSTEMS:   CONSTITUTIONAL: No fever, fatigue or weakness.  EYES: No blurred or double vision.  EARS, NOSE, AND THROAT: No tinnitus or ear pain.  RESPIRATORY: No cough, Positive shortness of breath, wheezing or hemoptysis.  CARDIOVASCULAR: Positive chest pain, orthopnea, edema.  GASTROINTESTINAL: No nausea, vomiting, diarrhea or abdominal pain.  GENITOURINARY: No dysuria, hematuria.  ENDOCRINE: No polyuria, nocturia,  HEMATOLOGY: No anemia, easy bruising or bleeding SKIN: No rash  or lesion. MUSCULOSKELETAL: No joint pain or arthritis.   NEUROLOGIC: No tingling, numbness, weakness.  PSYCHIATRY: No anxiety or depression.   MEDICATIONS AT HOME:  Prior to Admission medications   Medication Sig Start Date End Date Taking? Authorizing Provider  cetirizine (ZYRTEC) 10 MG tablet Take 10 mg by mouth daily as needed for allergies.   Yes [provider]  cholecalciferol (VITAMIN D) 1000 units tablet Take 1,000 Units by mouth daily.   Yes [provider]  Dexlansoprazole (DEXILANT PO) Take 1 capsule by mouth daily.   Yes [provider]  furosemide (LASIX) 20 MG tablet Take 1 tablet by mouth daily as needed. For EDEMA 10/07/16  Yes [provider]      PHYSICAL EXAMINATION:   VITAL SIGNS: Blood pressure (!) 140/54, pulse 82, temperature 97.7 F (36.5 C), temperature source Oral, resp. rate 18, height 5\' 3"  (1.6 m), weight 174 lb (78.9 kg), SpO2 97 %.  GENERAL:  83 y.o.-year-old patient lying in the bed with no acute distress.  EYES: Pupils equal, round, reactive to light and accommodation. No scleral icterus. Extraocular muscles intact.  HEENT: Head atraumatic, normocephalic. Oropharynx and nasopharynx clear.  NECK:  Supple, no jugular venous distention. No thyroid enlargement, no tenderness.  LUNGS:Decreased breath sounds at left lung base no wheezing  CARDIOVASCULAR: S1, S2 normal. No murmurs, rubs, or gallops.  ABDOMEN: Soft, nontender, nondistended. Bowel sounds present. No organomegaly or mass.  EXTREMITIES: No  pedal edema, cyanosis, or clubbing.  NEUROLOGIC: Cranial nerves II through XII are intact. Muscle strength 5/5 in all extremities. Sensation intact. Gait not checked.  PSYCHIATRIC: The patient is alert and oriented x 3.  SKIN: No obvious rash, lesion, or ulcer.   LABORATORY PANEL:   CBC  Recent Labs Lab 11/12/16 1439  WBC 7.2  HGB 12.2  HCT 35.9  PLT 178  MCV 82.0  MCH 27.9  MCHC 34.0  RDW 14.7*  LYMPHSABS 1.4   MONOABS 0.6  EOSABS 0.3  BASOSABS 0.0   ------------------------------------------------------------------------------------------------------------------  Chemistries   Recent Labs Lab 11/12/16 1439  NA 144  K 3.1*  CL 108  CO2 28  GLUCOSE 102*  BUN 11  CREATININE 0.64  CALCIUM 8.8*  AST 18  ALT 11*  ALKPHOS 74  BILITOT 0.6   ------------------------------------------------------------------------------------------------------------------ estimated creatinine clearance is 53.9 mL/min (by C-G formula based on SCr of 0.64 mg/dL). ------------------------------------------------------------------------------------------------------------------ No results for input(s): TSH, T4TOTAL, T3FREE, THYROIDAB in the last 72 hours.  Invalid input(s): FREET3   Coagulation profile  Recent Labs Lab 11/12/16 1448  INR 1.00   ------------------------------------------------------------------------------------------------------------------- No results for input(s): DDIMER in the last 72 hours. -------------------------------------------------------------------------------------------------------------------  Cardiac Enzymes  Recent Labs Lab 11/12/16 1439  TROPONINI <0.03   ------------------------------------------------------------------------------------------------------------------ Invalid input(s): POCBNP  ---------------------------------------------------------------------------------------------------------------  Urinalysis No results found for: COLORURINE, APPEARANCEUR, LABSPEC, PHURINE, GLUCOSEU, HGBUR, BILIRUBINUR, KETONESUR, PROTEINUR, UROBILINOGEN, NITRITE, LEUKOCYTESUR   RADIOLOGY: Dg Chest Portable 1 View  Result Date: 11/12/2016 CLINICAL DATA:  Shortness of Breath EXAM: PORTABLE CHEST 1 VIEW COMPARISON:  09/26/2013 FINDINGS: Cardiac shadow is enlarged. Large left-sided pleural effusion is noted. There is likely underlying atelectasis present. The right  lung is clear. No bony abnormality is noted. IMPRESSION: Large left pleural effusion. Electronically Signed   By: Inez Catalina M.D.   On: 11/12/2016 15:04    EKG: Orders placed or performed during the hospital encounter of 11/12/16  . ED EKG  . ED EKG    IMPRESSION AND PLAN: Patient is a 83 year old white female with history of nicotine abuse presenting with shortness of breath and chest pressure  1. Shortness of breath due to large left-sided pleural effusion  I will have radiology drain the fluid sent for analysis Once she has a CT scan do recommend obtaining a CT scan of the chest to further evaluate for lung mass and other pathology I will also place on nebulizers and steroids due to smoking history and a questionable COPD  2. Hypokalemia We'll replace potassium and recheck potassium in the morning  3. History of gastric ulcer continue PPIs  4. History of congestive heart failure type unknown does not appear to be in acute CHF type unknown  5. Nicotine abuse smoking cessation provided 46minutes spent strongly recommended she stop smoking patient will be started on a nicotine patch  All the records are reviewed and case discussed with ED provider. Management plans discussed with the patient, family and they are in agreement.  CODE STATUS: Code Status History    This patient does not have a recorded code status. Please follow your organizational policy for patients in this situation.       TOTAL TIME TAKING CARE OF THIS PATIENT: 55 minutes.    Dustin Flock M.D on 11/12/2016 at 5:18 PM  Between 7am to 6pm - Pager - [phone]  After 6pm go to [email or web address] - password [email or web address] [facility]  [facility]  Office  [phone]  CC: Primary care physician; Albina Billet, MD

## 2016-11-12 NOTE — ED Triage Notes (Signed)
Pt comes from home with tightness and ShoB since Friday.  Pt states tightness 4/10.

## 2016-11-12 NOTE — ED Provider Notes (Addendum)
[facility] Emergency Department Provider Note   ____________________________________________   First MD Initiated Contact with Patient 11/12/16 1437     (approximate)  I have reviewed the triage vital signs and the nursing notes.   HISTORY  Chief Complaint Chest Pain   HPI [name] is a 83 y.o. female who complains of shortness of breath and chest tightness since Friday. The made worse with exertion. No radiation. No fever. He is not really actual pain.   Past Medical History:  Diagnosis Date  . COPD (chronic obstructive pulmonary disease) [facility])     Patient Active Problem List   Diagnosis Date Noted  . Non-pressure chronic ulcer of right thigh with fat layer exposed ([place]) 03/16/2015    Past Surgical History:  Procedure Laterality Date  . CHOLECYSTECTOMY      Prior to Admission medications   Not on File    Allergies Patient has no allergy information on record.  History reviewed. No pertinent family history.  Social History Social History  Substance Use Topics  . Smoking status: Current Every Day Smoker    Packs/day: 2.00  . Smokeless tobacco: Never Used  . Alcohol use No    Review of Systems  Constitutional: No fever/chills Eyes: No visual changes. ENT: No sore throat. Cardiovascular: See history of present illness. Respiratory: See history of present illness. Gastrointestinal: No abdominal pain.  No nausea, no vomiting.  No diarrhea.  No constipation. Genitourinary: Negative for dysuria. Musculoskeletal: Negative for back pain. Skin: Negative for rash. Neurological: Negative for headaches, focal weakness   ____________________________________________   PHYSICAL EXAM:  VITAL SIGNS: ED Triage Vitals  Enc Vitals Group     BP --      Pulse Rate 11/12/16 1438 83     Resp 11/12/16 1438 (!) 22     Temp 11/12/16 1438 97.7 F (36.5 C)     Temp Source 11/12/16 1438 Oral     SpO2 11/12/16 1438 100 %     Weight  11/12/16 1440 174 lb (78.9 kg)     Height 11/12/16 1440 5\' 3"  (1.6 m)     Head Circumference --      Peak Flow --      Pain Score 11/12/16 1437 3     Pain Loc --      Pain Edu? --      Excl. in [place]? --     Constitutional: Alert and oriented. Sitting stiffly against the back her chair breathing with difficulty Eyes: Conjunctivae are normal.  Head: Atraumatic. Nose: No congestion/rhinnorhea. Mouth/Throat: Mucous membranes are moist.  Oropharynx non-erythematous. Neck: No stridor.  Cardiovascular: Normal rate, regular rhythm. Grossly normal heart sounds.  Good peripheral circulation. Respiratory: Normal respiratory effort.  No retractions. Lungs CTAB! Gastrointestinal: Soft and nontender. No distention. No abdominal bruits. No CVA tenderness. Musculoskeletal: No lower extremity tenderness nor edema.  No joint effusions. Neurologic:  Normal speech and language. No gross focal neurologic deficits are appreciated. No gait instability. Skin:  Skin is warm, dry and intact. No rash noted. Psychiatric: Mood and affect are normal. Speech and behavior are normal.  ____________________________________________   LABS (all labs ordered are listed, but only abnormal results are displayed)  Labs Reviewed  COMPREHENSIVE METABOLIC PANEL - Abnormal; Notable for the following:       Result Value   Potassium 3.1 (*)    Glucose, Bld 102 (*)    Calcium 8.8 (*)    Total Protein 6.1 (*)  Albumin 3.2 (*)    ALT 11 (*)    All other components within normal limits  CBC WITH DIFFERENTIAL/PLATELET - Abnormal; Notable for the following:    RDW 14.7 (*)    All other components within normal limits  FIBRIN DERIVATIVES D-DIMER ([facility] ONLY) - Abnormal; Notable for the following:    Fibrin derivatives D-dimer ([facility]) 1,735.50 (*)    All other components within normal limits  BRAIN NATRIURETIC PEPTIDE  TROPONIN I  PROTIME-INR  APTT   ____________________________________________  EKG KG read and  interpreted by me shows normal sinus rhythm rate of 75 normal axis and low amplitude diffusely no apparent acute changes  ____________________________________________  RADIOLOGY  Dg Chest Portable 1 View  Result Date: 11/12/2016 CLINICAL DATA:  Shortness of Breath EXAM: PORTABLE CHEST 1 VIEW COMPARISON:  09/26/2013 FINDINGS: Cardiac shadow is enlarged. Large left-sided pleural effusion is noted. There is likely underlying atelectasis present. The right lung is clear. No bony abnormality is noted. IMPRESSION: Large left pleural effusion. Electronically Signed   By: Inez Catalina M.D.   On: 11/12/2016 15:04    ____________________________________________   PROCEDURES   Procedures  Critical Care performed:   ____________________________________________   INITIAL IMPRESSION / ASSESSMENT AND PLAN / ED COURSE  Pertinent labs & imaging results that were available during my care of the patient were reviewed by me and considered in my medical decision making (see chart for details).        ____________________________________________   FINAL CLINICAL IMPRESSION(S) / ED DIAGNOSES  Final diagnoses:  Pleural effusion  Dyspnea, unspecified type      NEW MEDICATIONS STARTED DURING THIS VISIT:  New Prescriptions   No medications on file     Note:  This document was prepared using Dragon voice recognition software and may include unintentional dictation errors.    Nena Polio, MD 11/12/16 1549    Nena Polio, MD 11/12/16 1556

## 2016-11-13 ENCOUNTER — Inpatient Hospital Stay: Payer: Medicare HMO

## 2016-11-13 DIAGNOSIS — F17210 Nicotine dependence, cigarettes, uncomplicated: Secondary | ICD-10-CM

## 2016-11-13 DIAGNOSIS — Z8711 Personal history of peptic ulcer disease: Secondary | ICD-10-CM

## 2016-11-13 DIAGNOSIS — R079 Chest pain, unspecified: Secondary | ICD-10-CM

## 2016-11-13 DIAGNOSIS — R918 Other nonspecific abnormal finding of lung field: Secondary | ICD-10-CM

## 2016-11-13 DIAGNOSIS — J90 Pleural effusion, not elsewhere classified: Principal | ICD-10-CM

## 2016-11-13 DIAGNOSIS — R0602 Shortness of breath: Secondary | ICD-10-CM

## 2016-11-13 DIAGNOSIS — J449 Chronic obstructive pulmonary disease, unspecified: Secondary | ICD-10-CM

## 2016-11-13 DIAGNOSIS — Z9049 Acquired absence of other specified parts of digestive tract: Secondary | ICD-10-CM

## 2016-11-13 LAB — BASIC METABOLIC PANEL
ANION GAP: 10 (ref 5–15)
BUN: 15 mg/dL (ref 6–20)
CHLORIDE: 107 mmol/L (ref 101–111)
CO2: 24 mmol/L (ref 22–32)
Calcium: 8.6 mg/dL — ABNORMAL LOW (ref 8.9–10.3)
Creatinine, Ser: 0.74 mg/dL (ref 0.44–1.00)
GFR calc non Af Amer: >60 mL/min (ref >60)
GLUCOSE: 199 mg/dL — AB (ref 65–99)
POTASSIUM: 3.3 mmol/L — AB (ref 3.5–5.1)
SODIUM: 141 mmol/L (ref 135–145)

## 2016-11-13 LAB — BODY FLUID CELL COUNT WITH DIFFERENTIAL
EOS FL: 7 %
Lymphs, Fluid: 64 %
MONOCYTE-MACROPHAGE-SEROUS FLUID: 7 %
Neutrophil Count, Fluid: 22 %
OTHER CELLS FL: 0 %
Total Nucleated Cell Count, Fluid: 773 cu mm

## 2016-11-13 LAB — LACTATE DEHYDROGENASE, PLEURAL OR PERITONEAL FLUID: LD FL: 109 U/L — AB (ref 3–23)

## 2016-11-13 LAB — CBC
HCT: 34.6 % — ABNORMAL LOW (ref 35.0–47.0)
HEMOGLOBIN: 11.7 g/dL — AB (ref 12.0–16.0)
MCH: 27.9 pg (ref 26.0–34.0)
MCHC: 33.9 g/dL (ref 32.0–36.0)
MCV: 82.3 fL (ref 80.0–100.0)
PLATELETS: 189 10*3/uL (ref 150–440)
RBC: 4.2 MIL/uL (ref 3.80–5.20)
RDW: 14.6 % — ABNORMAL HIGH (ref 11.5–14.5)
WBC: 7.8 10*3/uL (ref 3.6–11.0)

## 2016-11-13 LAB — ALBUMIN, PLEURAL OR PERITONEAL FLUID: Albumin, Fluid: 2.2 g/dL

## 2016-11-13 LAB — LACTATE DEHYDROGENASE: LDH: 101 U/L (ref 98–192)

## 2016-11-13 LAB — GLUCOSE, PLEURAL OR PERITONEAL FLUID: GLUCOSE FL: 204 mg/dL

## 2016-11-13 MED ORDER — IOPAMIDOL (ISOVUE-300) INJECTION 61%
100.0000 mL | Freq: Once | INTRAVENOUS | Status: AC | PRN
  Administered 2016-11-13: 75 mL via INTRAVENOUS

## 2016-11-13 MED ORDER — ENSURE ENLIVE PO LIQD
237.0000 mL | ORAL | Status: DC
  Administered 2016-11-13: 237 mL via ORAL

## 2016-11-13 MED ORDER — BENZONATATE 100 MG PO CAPS
100.0000 mg | ORAL_CAPSULE | Freq: Three times a day (TID) | ORAL | Status: DC
  Administered 2016-11-13 – 2016-11-16 (×10): 100 mg via ORAL
  Filled 2016-11-13 (×12): qty 1

## 2016-11-13 MED ORDER — PREDNISONE 50 MG PO TABS
50.0000 mg | ORAL_TABLET | Freq: Every day | ORAL | Status: DC
  Administered 2016-11-14 – 2016-11-16 (×3): 50 mg via ORAL
  Filled 2016-11-13 (×3): qty 1

## 2016-11-13 MED ORDER — PREMIER PROTEIN SHAKE
11.0000 [oz_av] | Freq: Two times a day (BID) | ORAL | Status: DC
  Administered 2016-11-13: 11 [oz_av] via ORAL

## 2016-11-13 MED ORDER — POTASSIUM CHLORIDE 20 MEQ PO PACK
40.0000 meq | PACK | Freq: Once | ORAL | Status: AC
  Administered 2016-11-13: 40 meq via ORAL
  Filled 2016-11-13: qty 2

## 2016-11-13 MED ORDER — ENOXAPARIN SODIUM 40 MG/0.4ML ~~LOC~~ SOLN
40.0000 mg | SUBCUTANEOUS | Status: DC
  Administered 2016-11-13: 40 mg via SUBCUTANEOUS
  Filled 2016-11-13: qty 0.4

## 2016-11-13 NOTE — Consult Note (Signed)
[place]  [phone] [phone]) [phone]  ID: [name] Denham OB: [birth year]  MR#: [id number]  FTD#:[id number]  Patient Care Team: Albina Billet, MD as PCP - General (Internal Medicine)  CHIEF COMPLAINT: Multiple lung masses concerning for underlying malignancy.  INTERVAL HISTORY: Patient is an 83 year old female who presented to the emergency room with increasing shortness of breath and chest tightness. Subsequent CT scan revealed multiple lung masses as well as a large left-sided pleural effusion concerning for malignancy. She underwent thoracentesis revealed 1.7 L of fluid. Currently, she is symptomatically improved. She has no neurologic complaints. She denies any recent fevers. She has good appetite and denies weight loss. She has no further chest pain or shortness of breath. She denies any cough or hemoptysis. She has no nausea, vomiting, constipation, or diarrhea. She has no urinary complaints. Patient offers no further specific complaints today.  REVIEW OF SYSTEMS:   Review of Systems  Constitutional: Negative.  Negative for fever, malaise/fatigue and weight loss.  Respiratory: Negative.  Negative for cough, hemoptysis, sputum production, shortness of breath and wheezing.   Cardiovascular: Negative.  Negative for chest pain and leg swelling.  Gastrointestinal: Negative.  Negative for abdominal pain, blood in stool and melena.  Genitourinary: Negative.  Negative for frequency.  Musculoskeletal: Negative.   Skin: Negative.  Negative for rash.  Neurological: Negative.  Negative for weakness.  Psychiatric/Behavioral: Negative.  The patient is not nervous/anxious.     As per HPI. Otherwise, a complete review of systems is negative.  PAST MEDICAL HISTORY: Past Medical History:  Diagnosis Date  . COPD (chronic obstructive pulmonary disease) ([place])   . GU (gastric peptic ulcer)     PAST SURGICAL HISTORY: Past Surgical History:  Procedure Laterality  Date  . CHOLECYSTECTOMY      FAMILY HISTORY: Family History  Problem Relation Age of Onset  . Hypertension Father     ADVANCED DIRECTIVES (Y/N):  @ADVDIR @  HEALTH MAINTENANCE: Social History  Substance Use Topics  . Smoking status: Current Every Day Smoker    Packs/day: 2.00  . Smokeless tobacco: Never Used  . Alcohol use No     Colonoscopy:  PAP:  Bone density:  Lipid panel:  Allergies  Allergen Reactions  . Sulfa Antibiotics     Current Facility-Administered Medications  Medication Dose Route Frequency Provider Last Rate Last Dose  . 0.9 %  sodium chloride infusion  250 mL Intravenous PRN Dustin Flock, MD      . acetaminophen (TYLENOL) tablet 650 mg  650 mg Oral Q6H PRN Dustin Flock, MD   650 mg at 11/13/16 1706   Or  . acetaminophen (TYLENOL) suppository 650 mg  650 mg Rectal Q6H PRN Dustin Flock, MD      . benzonatate (TESSALON) capsule 100 mg  100 mg Oral TID Fritzi Mandes, MD   100 mg at 11/13/16 2216  . cholecalciferol (VITAMIN D) tablet 1,000 Units  1,000 Units Oral Daily Dustin Flock, MD   1,000 Units at 11/13/16 0957  . enoxaparin (LOVENOX) injection 40 mg  40 mg Subcutaneous Q24H Fritzi Mandes, MD   40 mg at 11/13/16 2216  . feeding supplement (ENSURE ENLIVE) (ENSURE ENLIVE) liquid 237 mL  237 mL Oral Q24H Dustin Flock, MD   237 mL at 11/13/16 1551  . guaiFENesin (MUCINEX) 12 hr tablet 600 mg  600 mg Oral BID Dustin Flock, MD   600 mg at 11/13/16 2215  . ipratropium-albuterol (DUONEB) 0.5-2.5 (3) MG/3ML nebulizer solution 3 mL  3  mL Nebulization Q6H Dustin Flock, MD   3 mL at 11/13/16 2019  . loratadine (CLARITIN) tablet 10 mg  10 mg Oral Daily Dustin Flock, MD   10 mg at 11/13/16 0958  . nicotine (NICODERM CQ - dosed in mg/24 hours) patch 21 mg  21 mg Transdermal Daily Dustin Flock, MD   21 mg at 11/13/16 0957  . ondansetron (ZOFRAN) tablet 4 mg  4 mg Oral Q6H PRN Dustin Flock, MD       Or  . ondansetron (ZOFRAN) injection 4 mg  4  mg Intravenous Q6H PRN Dustin Flock, MD      . pantoprazole (PROTONIX) EC tablet 40 mg  40 mg Oral QAC breakfast Dustin Flock, MD   40 mg at 11/13/16 0839  . [START ON 11/14/2016] predniSONE (DELTASONE) tablet 50 mg  50 mg Oral Q breakfast Fritzi Mandes, MD      . ramelteon (ROZEREM) tablet 8 mg  8 mg Oral Corwin Levins, MD   8 mg at 11/13/16 2216  . sodium chloride flush (NS) 0.9 % injection 3 mL  3 mL Intravenous Q12H Dustin Flock, MD   3 mL at 11/13/16 0958  . sodium chloride flush (NS) 0.9 % injection 3 mL  3 mL Intravenous PRN Dustin Flock, MD        OBJECTIVE: Vitals:   11/13/16 2019 11/13/16 2022  BP:  (!) 124/56  Pulse:  87  Resp:  (!) 21  Temp:  98.8 F (37.1 C)  SpO2: 95% 99%     Body mass index is 30.82 kg/m.    ECOG FS:0 - Asymptomatic  General: Well-developed, well-nourished, no acute distress. Eyes: Pink conjunctiva, anicteric sclera. HEENT: Normocephalic, moist mucous membranes, clear oropharnyx. Lungs: Clear to auscultation bilaterally. Heart: Regular rate and rhythm. No rubs, murmurs, or gallops. Abdomen: Soft, nontender, nondistended. No organomegaly noted, normoactive bowel sounds. Musculoskeletal: No edema, cyanosis, or clubbing. Neuro: Alert, answering all questions appropriately. Cranial nerves grossly intact. Skin: No rashes or petechiae noted. Psych: Normal affect. Lymphatics: No cervical, calvicular, axillary or inguinal LAD.   LAB RESULTS:  Lab Results  Component Value Date   NA 141 11/13/2016   K 3.3 (L) 11/13/2016   CL 107 11/13/2016   CO2 24 11/13/2016   GLUCOSE 199 (H) 11/13/2016   BUN 15 11/13/2016   CREATININE 0.74 11/13/2016   CALCIUM 8.6 (L) 11/13/2016   PROT 6.1 (L) 11/12/2016   ALBUMIN 3.2 (L) 11/12/2016   AST 18 11/12/2016   ALT 11 (L) 11/12/2016   ALKPHOS 74 11/12/2016   BILITOT 0.6 11/12/2016   GFRNONAA >60 11/13/2016   GFRAA >60 11/13/2016    Lab Results  Component Value Date   WBC 7.8 11/13/2016    NEUTROABS 5.0 11/12/2016   HGB 11.7 (L) 11/13/2016   HCT 34.6 (L) 11/13/2016   MCV 82.3 11/13/2016   PLT 189 11/13/2016     STUDIES: Ct Chest W Contrast  Result Date: 11/13/2016 CLINICAL DATA:  Known history of COPD and gastric ulcer presenting with chest pain and shortness of breath. Symptoms started yesterday. Heavy pressure on chest and shortness of breath. Dry cough. Follow-up thoracentesis. EXAM: CT CHEST WITH CONTRAST TECHNIQUE: Multidetector CT imaging of the chest was performed during intravenous contrast administration. CONTRAST:  76mL ISOVUE-300 IOPAMIDOL (ISOVUE-300) INJECTION 61% COMPARISON:  Ultrasound thoracentesis earlier today. Chest x-ray dated 11/12/2016. Chest CT dated 07/24/2008. FINDINGS: Cardiovascular: Aortic atherosclerosis. No aortic aneurysm. Heart size is within normal limits. No pericardial effusion. Coronary artery calcifications noted.  Mediastinum/Nodes: No enlarged or morphologically abnormal lymph nodes are identified within the mediastinum or perihilar regions. Esophagus appears normal. Trachea and central bronchi are unremarkable. Lungs/Pleura: Peripheral cavitary mass/consolidation within the lateral aspects of the left lower lobe, measuring 3.9 x 2.4 x 2.7 cm (series 3, image 104), with some surrounding atelectasis. Additional irregular masslike consolidation within the posterior-medial aspects of the left lower lobe, measuring 2.4 x 1.5 cm, (axial series 3, image 82 ; more suspicious appearance on sagittal reconstruction series 6, image 107). This could represent neoplastic mass or conglomerate atelectasis. Lastly, masslike consolidation within the left infrahilar lower lobe, measuring 3.7 x 1.8 x 3.3 cm (series 3, image 87; also more suspicious appearance on sagittal reconstruction series 6, image 114). This also could represent additional neoplastic process or conglomerate atelectasis. The left-sided pleural effusion seen on earlier chest x-ray has been removed.  Right lung is clear.  No pneumothorax. Upper Abdomen: Bilateral adrenal masses, measuring 2.8 x 1.9 cm on left and 1.8 x 1.3 cm on the right. Probable cirrhosis of the liver, incompletely imaged. Musculoskeletal: No acute or suspicious osseous finding. Mild degenerative spurring within the thoracic spine. IMPRESSION: 1. Cavitary mass/consolidation within the lateral aspects of the left lower lobe, measuring 3.9 cm, highly suspicious for neoplastic mass, less likely cavitary pneumonia. Recommend tissue sampling. This appears amenable to percutaneous biopsy. 2. Additional irregular masslike consolidation within the posterior-medial aspects of the left lower lobe, measuring 2.4 cm, most suspicious appearance on sagittal reconstructions (image numbers provided above), neoplastic mass versus conglomerate atelectasis. FDG PET-CT may be helpful for differentiation if needed. 3. Additional dense masslike consolidation within the central aspects of the left lower lobe, infrahilar, measuring 3.7 cm, also most suspicious appearance on sagittal reconstructions (image numbers provided above), also compatible with neoplastic mass versus conglomerate atelectasis. Again, FDG PET-CT may be helpful for differentiation. 4. No residual pleural effusion status post today's thoracentesis. 5. Bilateral adrenal masses, suspicious for adrenal metastases. 6. No evidence of mediastinal lymphadenopathy. 7. Aortic atherosclerosis. These results were called by telephone at the time of interpretation on 11/13/2016 at 3:25 pm to Dr. Fritzi Mandes , who verbally acknowledged these results. Electronically Signed   By: Franki Cabot M.D.   On: 11/13/2016 15:29   Dg Chest Portable 1 View  Result Date: 11/12/2016 CLINICAL DATA:  Shortness of Breath EXAM: PORTABLE CHEST 1 VIEW COMPARISON:  09/26/2013 FINDINGS: Cardiac shadow is enlarged. Large left-sided pleural effusion is noted. There is likely underlying atelectasis present. The right lung is clear.  No bony abnormality is noted. IMPRESSION: Large left pleural effusion. Electronically Signed   By: Inez Catalina M.D.   On: 11/12/2016 15:04   US Thoracentesis Asp Pleural Space W/img Guide  Result Date: 11/13/2016 CLINICAL DATA:  Large left pleural effusion. EXAM: ULTRASOUND GUIDED LEFT THORACENTESIS COMPARISON:  None. PROCEDURE: An ultrasound guided thoracentesis was thoroughly discussed with the patient and questions answered. The benefits, risks, alternatives and complications were also discussed. The patient understands and wishes to proceed with the procedure. Written consent was obtained. A time-out was performed prior to the procedure. Ultrasound was performed to localize and mark an adequate pocket of fluid in the left chest. The area was then prepped and draped in the normal sterile fashion. 1% Lidocaine was used for local anesthesia. Under ultrasound guidance a 6 French Safe-T-Centesis catheter was introduced. Thoracentesis was performed. The catheter was removed and a dressing applied. COMPLICATIONS: None FINDINGS: A total of approximately 1.7 L of yellowish colored fluid was removed. A  fluid sample was sent for laboratory analysis. IMPRESSION: Successful ultrasound guided left thoracentesis yielding 1.7 L of pleural fluid. Electronically Signed   By: Aletta Edouard M.D.   On: 11/13/2016 15:07    ASSESSMENT: Multiple lung masses concerning for underlying malignancy.  PLAN:    1. Lung masses: CT scan results reviewed independently and reported as above. Pleural fluid also likely malignant and cytology is pending at time of dictation. After lengthy discussion with the patient and her family, she wishes to pursue PET scan as well as biopsy to determine a diagnosis and extensive disease. She also require a brain MRI to complete the staging workup. The remainder of her diagnostic workup could be accomplished as an outpatient, therefore it is okay to discharge from an oncology standpoint.    Appreciate consult, call with questions.   [name], MD   11/13/2016 10:45 PM

## 2016-11-13 NOTE — Progress Notes (Signed)
Initial Nutrition Assessment  DOCUMENTATION CODES:   Obesity unspecified  INTERVENTION:   Premier Protein BID, each supplement provides 160kcal and 30g protein.   NUTRITION DIAGNOSIS:   Increased nutrient needs related to  (COPD, CHF) as evidenced by increased estimated needs from protein.  GOAL:   Patient will meet greater than or equal to 90% of their needs  MONITOR:   PO intake, Supplement acceptance, Labs, Weight trends, I & O's  REASON FOR ASSESSMENT:   Malnutrition Screening Tool    ASSESSMENT:   83 y.o. female with a known history of  COPD and gastric ulcer presenting with chest pain and shortness of breath. Pt found to have large left-sided pleural effusion  Pt with good appetite and oral intake pta. Per chart, pt is weight stable for the past year. Pt ate 85% of her breakfast this morning. Pt with pleural effusion; per MD note plan to obtain sample for analysis and get CT scan to rule out lung mass. Pt with h/o CHF but does not appear to be in CHF currently. RD will order supplements to help pt meet estimated protein needs. Pt with hypokalemia; monitor and supplement as needed per MD discretion.     Medications reviewed and include: Vitamin D, solu-medrol, nicotine, protonix  Labs reviewed: K 3.3(L), Ca 8.6(L)  Nutrition-Focused physical exam completed. Findings are no fat depletion, no muscle depletion, and no edema.   Diet Order:  Diet Heart Room service appropriate? Yes; Fluid consistency: Thin  Skin:  Reviewed, no issues  Last BM:  8/26  Height:   Ht Readings from Last 1 Encounters:  11/12/16 5\' 3"  (1.6 m)    Weight:   Wt Readings from Last 1 Encounters:  11/12/16 174 lb (78.9 kg)    Ideal Body Weight:  52.3 kg  BMI:  Body mass index is 30.82 kg/m.  Estimated Nutritional Needs:   Kcal:  1500-1700kcal/day   Protein:  79-95g/day   Fluid:  >1.5L/day   EDUCATION NEEDS:   No education needs identified at this time  Koleen Distance MS,  RD, [place] Pager #[phone] After Hours Pager: [phone]

## 2016-11-13 NOTE — Progress Notes (Signed)
[facility] [place] NAME: [name]    MR#:  [id number]  DATE OF BIRTH:  1932-11-05  SUBJECTIVE:  Came in with increasing sob and chest tightness Some loose BM's which pt thinks is due to her IBS dter's in the room  REVIEW OF SYSTEMS:   Review of Systems  Constitutional: Negative for chills, fever and weight loss.  HENT: Negative for ear discharge, ear pain and nosebleeds.   Eyes: Negative for blurred vision, pain and discharge.  Respiratory: Positive for shortness of breath. Negative for sputum production, wheezing and stridor.   Cardiovascular: Negative for chest pain, palpitations, orthopnea and PND.  Gastrointestinal: Positive for diarrhea. Negative for abdominal pain, nausea and vomiting.  Genitourinary: Negative for frequency and urgency.  Musculoskeletal: Negative for back pain and joint pain.  Neurological: Positive for weakness. Negative for sensory change, speech change and focal weakness.  Psychiatric/Behavioral: Negative for depression and hallucinations. The patient is not nervous/anxious.    Tolerating Diet:yes Tolerating PT: ambulatory  DRUG ALLERGIES:   Allergies  Allergen Reactions  . Sulfa Antibiotics     VITALS:  Blood pressure 137/61, pulse 81, temperature 98.1 F (36.7 C), temperature source Oral, resp. rate 20, height 5\' 3"  (1.6 m), weight 78.9 kg (174 lb), SpO2 96 %.  PHYSICAL EXAMINATION:   Physical Exam  GENERAL:  83 y.o.-year-old patient lying in the bed with no acute distress.  EYES: Pupils equal, round, reactive to light and accommodation. No scleral icterus. Extraocular muscles intact.  HEENT: Head atraumatic, normocephalic. Oropharynx and nasopharynx clear.  NECK:  Supple, no jugular venous distention. No thyroid enlargement, no tenderness.  LUNGS: decreased breath sounds bilaterally left > right, no wheezing, rales, rhonchi. No use of accessory muscles of respiration.  CARDIOVASCULAR:  S1, S2 normal. No murmurs, rubs, or gallops.  ABDOMEN: Soft, nontender, nondistended. Bowel sounds present. No organomegaly or mass.  EXTREMITIES: No cyanosis, clubbing or edema b/l.    NEUROLOGIC: Cranial nerves II through XII are intact. No focal Motor or sensory deficits b/l.   PSYCHIATRIC:  patient is alert and oriented x 3.  SKIN: No obvious rash, lesion, or ulcer.   LABORATORY PANEL:  CBC  Recent Labs Lab 11/13/16 0523  WBC 7.8  HGB 11.7*  HCT 34.6*  PLT 189    Chemistries   Recent Labs Lab 11/12/16 1439 11/13/16 0523  NA 144 141  K 3.1* 3.3*  CL 108 107  CO2 28 24  GLUCOSE 102* 199*  BUN 11 15  CREATININE 0.64 0.74  CALCIUM 8.8* 8.6*  AST 18  --   ALT 11*  --   ALKPHOS 74  --   BILITOT 0.6  --    Cardiac Enzymes  Recent Labs Lab 11/12/16 1439  TROPONINI <0.03   RADIOLOGY:  Dg Chest Portable 1 View  Result Date: 11/12/2016 CLINICAL DATA:  Shortness of Breath EXAM: PORTABLE CHEST 1 VIEW COMPARISON:  09/26/2013 FINDINGS: Cardiac shadow is enlarged. Large left-sided pleural effusion is noted. There is likely underlying atelectasis present. The right lung is clear. No bony abnormality is noted. IMPRESSION: Large left pleural effusion. Electronically Signed   By: Inez Catalina M.D.   On: 11/12/2016 15:04   ASSESSMENT AND PLAN:  [name]  is a 83 y.o. female with a known history of  COPD and gastric ulcer presenting with chest pain and shortness of breath. She reports that her symptoms started since yesterday. She states that it feels like heavy pressure  on the chest and is feeling short of breath. Patient also has had a dry cough.  1. Shortness of breath due to large left-sided pleural effusion  - will have radiology drain the fluid sent for analysis -obtaining a CT scan of the chest to further evaluate for lung mass and other pathology - will also place on nebulizers and steroids due to smoking history and a questionable COPD  2. Hypokalemia We'll  replace potassium and recheck potassium in the morning  3. History of gastric ulcer continue PPIs  4. History of IBS Has been having runny stools lately. Send GI PCR panel  5. Nicotine abuse smoking cessation provided 80minutes spent strongly recommended she stop smoking patient will be started on a nicotine patch  D/w 2 dter's in the room  Case discussed with Care Management/Social Worker. Management plans discussed with the patient, family and they are in agreement.  CODE STATUS: FULL  DVT Prophylaxis: lovenox  TOTAL TIME TAKING CARE OF THIS PATIENT: *30* minutes.  >50% time spent on counselling and coordination of care  POSSIBLE D/C IN 1-2 DAYS, DEPENDING ON CLINICAL CONDITION.  Note: This dictation was prepared with Dragon dictation along with smaller phrase technology. Any transcriptional errors that result from this process are unintentional.  Melesa Lecy M.D on 11/13/2016 at 11:51 AM  Between 7am to 6pm - Pager - [phone]  After 6pm go to [email or web address] - password [email or web address] [facility]  Office  [phone]  CC: Primary care physician; Albina Billet, MD

## 2016-11-14 ENCOUNTER — Inpatient Hospital Stay (HOSPITAL_COMMUNITY)
Admit: 2016-11-14 | Discharge: 2016-11-14 | Disposition: A | Discharge status: Home / Self Care | Payer: Medicare HMO | Attending: Internal Medicine | Admitting: Internal Medicine

## 2016-11-14 ENCOUNTER — Other Ambulatory Visit: Payer: Self-pay

## 2016-11-14 DIAGNOSIS — R918 Other nonspecific abnormal finding of lung field: Secondary | ICD-10-CM

## 2016-11-14 DIAGNOSIS — R079 Chest pain, unspecified: Secondary | ICD-10-CM

## 2016-11-14 DIAGNOSIS — R0602 Shortness of breath: Secondary | ICD-10-CM

## 2016-11-14 LAB — HEPARIN LEVEL (UNFRACTIONATED): HEPARIN UNFRACTIONATED: 0.24 [IU]/mL — AB (ref 0.30–0.70)

## 2016-11-14 LAB — ECHOCARDIOGRAM COMPLETE
Height: 63 in
Weight: 2751.34 oz

## 2016-11-14 LAB — MAGNESIUM: Magnesium: 1.9 mg/dL (ref 1.7–2.4)

## 2016-11-14 LAB — POTASSIUM: Potassium: 3.1 mmol/L — ABNORMAL LOW (ref 3.5–5.1)

## 2016-11-14 MED ORDER — METOPROLOL TARTRATE 5 MG/5ML IV SOLN
2.5000 mg | Freq: Once | INTRAVENOUS | Status: AC
  Administered 2016-11-14: 2.5 mg via INTRAVENOUS
  Filled 2016-11-14: qty 5

## 2016-11-14 MED ORDER — DILTIAZEM HCL 100 MG IV SOLR
5.0000 mg/h | INTRAVENOUS | Status: DC
  Filled 2016-11-14: qty 100

## 2016-11-14 MED ORDER — DILTIAZEM HCL 60 MG PO TABS
60.0000 mg | ORAL_TABLET | Freq: Three times a day (TID) | ORAL | Status: DC
  Administered 2016-11-14: 60 mg via ORAL
  Filled 2016-11-14 (×2): qty 1

## 2016-11-14 MED ORDER — MAGNESIUM OXIDE 400 (241.3 MG) MG PO TABS
400.0000 mg | ORAL_TABLET | ORAL | Status: AC
  Administered 2016-11-14 (×2): 400 mg via ORAL
  Filled 2016-11-14 (×2): qty 1

## 2016-11-14 MED ORDER — LEVALBUTEROL HCL 0.63 MG/3ML IN NEBU
INHALATION_SOLUTION | RESPIRATORY_TRACT | Status: AC
  Filled 2016-11-14: qty 3

## 2016-11-14 MED ORDER — HEPARIN (PORCINE) IN NACL 100-0.45 UNIT/ML-% IJ SOLN
1050.0000 [IU]/h | INTRAMUSCULAR | Status: DC
  Administered 2016-11-14: 950 [IU]/h via INTRAVENOUS
  Administered 2016-11-15: 1050 [IU]/h via INTRAVENOUS
  Filled 2016-11-14 (×2): qty 250

## 2016-11-14 MED ORDER — HEPARIN BOLUS VIA INFUSION
1050.0000 [IU] | Freq: Once | INTRAVENOUS | Status: AC
  Administered 2016-11-14: 10.5 [IU] via INTRAVENOUS
  Filled 2016-11-14: qty 1050

## 2016-11-14 MED ORDER — HEPARIN BOLUS VIA INFUSION
2000.0000 [IU] | Freq: Once | INTRAVENOUS | Status: DC
  Filled 2016-11-14: qty 2000

## 2016-11-14 MED ORDER — LEVALBUTEROL HCL 0.63 MG/3ML IN NEBU
0.6300 mg | INHALATION_SOLUTION | Freq: Four times a day (QID) | RESPIRATORY_TRACT | Status: DC
  Administered 2016-11-14 – 2016-11-15 (×5): 0.63 mg via RESPIRATORY_TRACT
  Filled 2016-11-14 (×4): qty 3

## 2016-11-14 MED ORDER — POTASSIUM CHLORIDE CRYS ER 20 MEQ PO TBCR
40.0000 meq | EXTENDED_RELEASE_TABLET | ORAL | Status: AC
  Administered 2016-11-14 (×2): 40 meq via ORAL
  Filled 2016-11-14 (×2): qty 2

## 2016-11-14 MED ORDER — POTASSIUM CHLORIDE 20 MEQ PO PACK
40.0000 meq | PACK | ORAL | Status: DC
  Administered 2016-11-14: 40 meq via ORAL
  Filled 2016-11-14: qty 2

## 2016-11-14 MED ORDER — HEPARIN (PORCINE) IN NACL 100-0.45 UNIT/ML-% IJ SOLN: 950.0000 [IU]/h | INTRAMUSCULAR | Status: DC

## 2016-11-14 MED ORDER — DILTIAZEM HCL 30 MG PO TABS
30.0000 mg | ORAL_TABLET | Freq: Four times a day (QID) | ORAL | Status: DC
  Administered 2016-11-14 – 2016-11-15 (×3): 30 mg via ORAL
  Filled 2016-11-14 (×3): qty 1

## 2016-11-14 MED ORDER — HEPARIN BOLUS VIA INFUSION
2000.0000 [IU] | Freq: Once | INTRAVENOUS | Status: AC
  Administered 2016-11-14: 2000 [IU] via INTRAVENOUS
  Filled 2016-11-14: qty 2000

## 2016-11-14 NOTE — Consult Note (Signed)
Cardiology Consultation Note  Patient ID: [name], MRN: [id number], DOB/AGE: 81-Sep-1934 81 y.o. Admit date: 11/12/2016   Date of Consult: 11/14/2016 Primary Physician: Albina Billet, MD Primary Cardiologist: New to [facility] - consult by End Requesting Physician: Dr. Posey Pronto, MD  Chief Complaint: SOB/chest tightness Reason for Consult: new onset Afib with RVR  HPI: [name] is a 83 y.o. female who is being seen today for the evaluation of new onset Afib with RVR at the request of Dr. Posey Pronto, MD. Patient has a h/o COPD 2/2 ongoing tobacco abuse and IBS who presented to [facility] with 2 day history of SOB. She was found to have likely newly diagnosed lung cancer with malignant pleural effusion s/p thoracentesis. She developed new onset Afib with RVR on the morning of 8/28.   No prior known cardiac history. Patient is a long time smoker of 2 packs daily and continues to smoke. She woke up on 8/24 with increased SOB and chest tightness. States she went to sleep on 8/23 at her baseline. No chest pain. Her symptoms persisted into 8/26 prompting her to come to [facility]. Subsequent CT scan revealed multiple lung masses as well as a large left-sided pleural effusion concerning for malignancy. She underwent thoracentesis revealed 1.7 L of fluid, labs pending. Symptoms improved. She has been seen by oncology with plans for outpatient staging, evaluation and treatment. On the morning of 8/28 at 12:56 AM she went into new onset Afib with RVR into the 140s bpm. She was asymptomatic. She was noted to be hypokalemic this admission and has received KCl x 2, though hypokalemia persists. Heart rate has improved this morning without intervention though she remains in Afib with RVR with heart rates in the 110s bpm. She has been started on short-acting diltiazem by IM. TTE is pending.   Past Medical History:  Diagnosis Date  . COPD (chronic obstructive pulmonary disease) ([place])   . GU (gastric peptic ulcer)       Most Recent  Cardiac Studies: none   Surgical History:  Past Surgical History:  Procedure Laterality Date  . CHOLECYSTECTOMY       Home Meds: Prior to Admission medications   Medication Sig Start Date End Date Taking? Authorizing Provider  cetirizine (ZYRTEC) 10 MG tablet Take 10 mg by mouth daily as needed for allergies.   Yes [provider]  cholecalciferol (VITAMIN D) 1000 units tablet Take 1,000 Units by mouth daily.   Yes [provider]  Dexlansoprazole (DEXILANT PO) Take 1 capsule by mouth daily.   Yes [provider]  furosemide (LASIX) 20 MG tablet Take 1 tablet by mouth daily as needed. For EDEMA 10/07/16  Yes [provider]    Inpatient Medications:  . benzonatate  100 mg Oral TID  . cholecalciferol  1,000 Units Oral Daily  . diltiazem  60 mg Oral Q8H  . enoxaparin (LOVENOX) injection  40 mg Subcutaneous Q24H  . feeding supplement (ENSURE ENLIVE)  237 mL Oral Q24H  . guaiFENesin  600 mg Oral BID  . ipratropium-albuterol  3 mL Nebulization Q6H  . loratadine  10 mg Oral Daily  . nicotine  21 mg Transdermal Daily  . pantoprazole  40 mg Oral QAC breakfast  . predniSONE  50 mg Oral Q breakfast  . ramelteon  8 mg Oral QHS  . sodium chloride flush  3 mL Intravenous Q12H   . sodium chloride      Allergies:  Allergies  Allergen Reactions  .  Sulfa Antibiotics     Social History   Social History  . Marital status: Widowed    Spouse name: N/A  . Number of children: N/A  . Years of education: N/A   Occupational History  . Not on file.   Social History Main Topics  . Smoking status: Current Every Day Smoker    Packs/day: 2.00  . Smokeless tobacco: Never Used  . Alcohol use No  . Drug use: No  . Sexual activity: No   Other Topics Concern  . Not on file   Social History Narrative  . No narrative on file     Family History  Problem Relation Age of Onset  . Hypertension Father      Review of Systems: Review of Systems    Constitutional: Positive for malaise/fatigue and weight loss. Negative for chills, diaphoresis and fever.  HENT: Negative for congestion.   Eyes: Negative for discharge and redness.  Respiratory: Positive for shortness of breath. Negative for cough, hemoptysis, sputum production and wheezing.   Cardiovascular: Negative for chest pain, palpitations, orthopnea, claudication, leg swelling and PND.  Gastrointestinal: Negative for abdominal pain, blood in stool, heartburn, melena, nausea and vomiting.  Genitourinary: Negative for hematuria.  Musculoskeletal: Negative for falls and myalgias.  Skin: Negative for rash.  Neurological: Positive for weakness. Negative for dizziness, tingling, tremors, sensory change, speech change, focal weakness and loss of consciousness.  Endo/Heme/Allergies: Does not bruise/bleed easily.  Psychiatric/Behavioral: Negative for substance abuse. The patient is not nervous/anxious.   All other systems reviewed and are negative.   Labs:  Recent Labs  11/12/16 1439  TROPONINI <0.03   Lab Results  Component Value Date   WBC 7.8 11/13/2016   HGB 11.7 (L) 11/13/2016   HCT 34.6 (L) 11/13/2016   MCV 82.3 11/13/2016   PLT 189 11/13/2016    Recent Labs Lab 11/12/16 1439 11/13/16 0523  NA 144 141  K 3.1* 3.3*  CL 108 107  CO2 28 24  BUN 11 15  CREATININE 0.64 0.74  CALCIUM 8.8* 8.6*  PROT 6.1*  --   BILITOT 0.6  --   ALKPHOS 74  --   ALT 11*  --   AST 18  --   GLUCOSE 102* 199*   No results found for: CHOL, HDL, LDLCALC, TRIG No results found for: DDIMER  Radiology/Studies:  Ct Chest W Contrast  Result Date: 11/13/2016 IMPRESSION: 1. Cavitary mass/consolidation within the lateral aspects of the left lower lobe, measuring 3.9 cm, highly suspicious for neoplastic mass, less likely cavitary pneumonia. Recommend tissue sampling. This appears amenable to percutaneous biopsy. 2. Additional irregular masslike consolidation within the posterior-medial  aspects of the left lower lobe, measuring 2.4 cm, most suspicious appearance on sagittal reconstructions (image numbers provided above), neoplastic mass versus conglomerate atelectasis. FDG PET-CT may be helpful for differentiation if needed. 3. Additional dense masslike consolidation within the central aspects of the left lower lobe, infrahilar, measuring 3.7 cm, also most suspicious appearance on sagittal reconstructions (image numbers provided above), also compatible with neoplastic mass versus conglomerate atelectasis. Again, FDG PET-CT may be helpful for differentiation. 4. No residual pleural effusion status post today's thoracentesis. 5. Bilateral adrenal masses, suspicious for adrenal metastases. 6. No evidence of mediastinal lymphadenopathy. 7. Aortic atherosclerosis. These results were called by telephone at the time of interpretation on 11/13/2016 at 3:25 pm to Dr. Fritzi Mandes , who verbally acknowledged these results. Electronically Signed   By: Franki Cabot M.D.   On: 11/13/2016 15:29  Dg Chest Portable 1 View  Result Date: 11/12/2016 CLINICAL DATA:  Shortness of Breath EXAM: PORTABLE CHEST 1 VIEW COMPARISON:  09/26/2013 FINDINGS: Cardiac shadow is enlarged. Large left-sided pleural effusion is noted. There is likely underlying atelectasis present. The right lung is clear. No bony abnormality is noted. IMPRESSION: Large left pleural effusion. Electronically Signed   By: Inez Catalina M.D.   On: 11/12/2016 15:04   US Thoracentesis Asp Pleural Space W/img Guide  Result Date: 11/13/2016 IMPRESSION: Successful ultrasound guided left thoracentesis yielding 1.7 L of pleural fluid. Electronically Signed   By: Aletta Edouard M.D.   On: 11/13/2016 15:07    EKG: Interpreted by me showed: not in Epic Telemetry: Interpreted by me showed: currently Afib with RVR, 110s bpm, episodes of Afib with RVR into the 140s bpm, went into Afib with RVR at 12:56 AM on 8/28  Weights: Filed Weights   11/12/16 1440   Weight: 174 lb (78.9 kg)     Physical Exam: Blood pressure (!) 105/47, pulse (!) 115, temperature 97.6 F (36.4 C), temperature source Oral, resp. rate 18, height 5\' 3"  (1.6 m), weight 174 lb (78.9 kg), SpO2 95 %. Body mass index is 30.82 kg/m. General: Well developed, well nourished, in no acute distress. Head: Normocephalic, atraumatic, sclera non-icteric, no xanthomas, nares are without discharge.  Neck: Negative for carotid bruits. JVD not elevated. Lungs: Clear bilaterally to auscultation without wheezes, rales, or rhonchi. Breathing is unlabored. Heart: Tachycardic, irregularly irregular with S1 S2. No murmurs, rubs, or gallops appreciated. Abdomen: Soft, non-tender, non-distended with normoactive bowel sounds. No hepatomegaly. No rebound/guarding. No obvious abdominal masses. Msk:  Strength and tone appear normal for age. Extremities: No clubbing or cyanosis. No edema. Distal pedal pulses are 2+ and equal bilaterally. Neuro: Alert and oriented X 3. No facial asymmetry. No focal deficit. Moves all extremities spontaneously. Psych:  Responds to questions appropriately with a normal affect.    Assessment and Plan:  Active Problems:   SOB (shortness of breath)    1. New onset Afib with RVR: -Developed Afib with RVR at 12:56 AM on 8/28 with heart rates into the 140s bpm, now improved to the 110s bpm -Asymptomatic -Likely in the setting of the stresses of her recently diagnosed likely cancer, SOB, and hypokalemia -Agree with PO Cardizem 60 mg q 8 hours, perhaps consolidate later today -Avoid BB given underlying SOB/wheezing -Will not place on full dose anticoagulation as she will require biopsies in the near future -She is aware of stroke risk\ -If she is going to remain inpatient, consider heparin gtt -CHADS2VASc at least 4 (age x 2, vascular disease, female) -Ambulate to assess for heart rate control -Replete potassium to goal > 4.0 -Add magnesium to goal > 2.0 -TSH  normal -TTE pending  2. SOB with pleural effusion concerning for lung cancer: -Per IM -Will need outpatient oncology follow up with biopsies   3. Hypokalemia: -Replete to goal as above   Signed, Christell Faith, PA-C [place] Pager: [phone] 11/14/2016, 8:37 AM

## 2016-11-14 NOTE — Progress Notes (Signed)
ANTICOAGULATION CONSULT NOTE - Initial Consult  Pharmacy Consult for Heparin Drip Indication: atrial fibrillation  Allergies  Allergen Reactions  . Sulfa Antibiotics     Patient Measurements: Height: 5\' 3"  (160 cm) Weight: 171 lb 15.3 oz (78 kg) IBW/kg (Calculated) : 52.4 Heparin Dosing Weight: 69kg  Vital Signs: Temp: 97.8 F (36.6 C) (08/28 1900) Temp Source: Oral (08/28 1900) BP: 111/70 (08/28 2000) Pulse Rate: 109 (08/28 2000)  Labs:  Recent Labs  11/12/16 1439 11/12/16 1448 11/13/16 0523 11/14/16 2023  HGB 12.2  --  11.7*  --   HCT 35.9  --  34.6*  --   PLT 178  --  189  --   APTT  --  28  --   --   LABPROT  --  13.2  --   --   INR  --  1.00  --   --   HEPARINUNFRC  --   --   --  0.24*  CREATININE 0.64  --  0.74  --   TROPONINI <0.03  --   --   --     Estimated Creatinine Clearance: 53.6 mL/min (by C-G formula based on SCr of 0.74 mg/dL).   Medical History: Past Medical History:  Diagnosis Date  . COPD (chronic obstructive pulmonary disease) ([place])   . GU (gastric peptic ulcer)      Assessment: 83 yo female with A. Fib with RVR. Pharmacy consulted for heparin drip dosing and monitoring.   Goal of Therapy:  Heparin level 0.3-0.7 units/ml Monitor platelets by anticoagulation protocol: Yes   Plan:  Patient was on enoxaparin 40mg  daily. Will not give full 3500u bolus.  Dosing weight: 69kg  Give 2000 units bolus x 1 Start heparin infusion at 950 units/hr Check anti-Xa level in 8 hours and daily while on heparin Continue to monitor H&H and platelets  8/28 2023 HL subtherapeutic x 1. 1050 units IV x 1 bolus and increase rate to 1050 units/hr. Will recheck HL in 8 hours.  Laural Benes, PharmD, BCPS Clinical Pharmacist 11/14/2016 9:05 PM

## 2016-11-14 NOTE — Consult Note (Signed)
Name: [name] MRN: [id number] DOB: 1932-06-13    ADMISSION DATE:  11/12/2016  CONSULTATION DATE: 11/14/16  REFERRING MD :  Dr. Posey Pronto  CHIEF COMPLAINT: shortness of breath and chest pain  BRIEF PATIENT DESCRIPTION: 83 year old female with new onset of Afib with RVR  SIGNIFICANT EVENTS  8/26 Patient  Was admitted with shortness of breath 8/28 Patient developed new onset for Afib RVR  STUDIES: 11/14/16 ECHO>> The cavity size was normal. Wall thickness was  increased in a pattern of mild LVH. Systolic function was  vigorous. The estimated ejection fraction was in the range of 65%   to 70%. The study is not technically sufficient to allow  HISTORY OF PRESENT ILLNESS:  [name] is an 83 year old female with COPD,IBS and gastric ulcer.  Patient presented initially on 8/26 with shortness of breath and chest tighghtness.  CT chest was concerning for multiple lung mass as well as large left sided pleural efusion concerning for malignancy.  She underwent thoracentesis and was taken 1.7 liter of fluid post procedure she developed afib and rapid ventricular response.  Patient was initially started on diltiazem infusion which was subsequently changed to po . Patient continues to be in afib with controlled rate and Heparin gtt.  PAST MEDICAL HISTORY :   has a past medical history of COPD (chronic obstructive pulmonary disease) ([facility]) and GU (gastric peptic ulcer).  has a past surgical history that includes Cholecystectomy. Prior to Admission medications   Medication Sig Start Date End Date Taking? Authorizing Provider  cetirizine (ZYRTEC) 10 MG tablet Take 10 mg by mouth daily as needed for allergies.   Yes [provider]  cholecalciferol (VITAMIN D) 1000 units tablet Take 1,000 Units by mouth daily.   Yes [provider]  Dexlansoprazole (DEXILANT PO) Take 1 capsule by mouth daily.   Yes [provider]  furosemide (LASIX) 20 MG tablet Take 1 tablet by mouth daily  as needed. For EDEMA 10/07/16  Yes [provider]   Allergies  Allergen Reactions  . Sulfa Antibiotics     FAMILY HISTORY:  family history includes Hypertension in her father. SOCIAL HISTORY:  reports that she has been smoking.  She has been smoking about 2.00 packs per day. She has never used smokeless tobacco. She reports that she does not drink alcohol or use drugs.  REVIEW OF SYSTEMS:   Constitutional: Negative for fever, chills, weight loss, malaise/fatigue and diaphoresis.  HENT: Negative for hearing loss, ear pain, nosebleeds, congestion, sore throat, neck pain, tinnitus and ear discharge.   Eyes: Negative for blurred vision, double vision, photophobia, pain, discharge and redness.  Respiratory: Negative for cough, hemoptysis, sputum production, shortness of breath, wheezing and stridor.   Cardiovascular: Negative for chest pain, palpitations, orthopnea, claudication, leg swelling and PND.  Gastrointestinal: Negative for heartburn, nausea, vomiting, abdominal pain, diarrhea, constipation, blood in stool and melena.  Genitourinary: Negative for dysuria, urgency, frequency, hematuria and flank pain.  Musculoskeletal: Negative for myalgias, back pain, joint pain and falls.  Skin: Negative for itching and rash.  Neurological: Negative for dizziness, tingling, tremors, sensory change, speech change, focal weakness, seizures, loss of consciousness, weakness and headaches.  Endo/Heme/Allergies: Negative for environmental allergies and polydipsia. Does not bruise/bleed easily.  SUBJECTIVE: Patient states that she has been feeling fine  VITAL SIGNS: Temp:  [97.6 F (36.4 C)-98.8 F (37.1 C)] 97.8 F (36.6 C) (08/28 1900) Pulse Rate:  [84-116] 109 (08/28 2000) Resp:  [15-25] 15 (08/28 2000)  BP: (97-126)/(47-77) 111/70 (08/28 2000) SpO2:  [86 %-98 %] 86 % (08/28 2000) Weight:  [78 kg (171 lb 15.3 oz)] 78 kg (171 lb 15.3 oz) (08/28 1150)  PHYSICAL EXAMINATION: General:   Elderly Caucasian female, in no acute distress Neuro: Awake,Alert and oriented HEENT:  AT,[place],No jvd Cardiovascular:  Irregular, no m r/g noted Lungs: diminished bibasilar, no wheezes,crackles, rhonchi Abdomen: Soft,NT,ND, Positive bowel sounds Musculoskeletal:  No edema/cyanosis  Skin:  Warm,dry and Intact   Recent Labs Lab 11/12/16 1439 11/13/16 0523 11/14/16 0856  NA 144 141  --   K 3.1* 3.3* 3.1*  CL 108 107  --   CO2 28 24  --   BUN 11 15  --   CREATININE 0.64 0.74  --   GLUCOSE 102* 199*  --     Recent Labs Lab 11/12/16 1439 11/13/16 0523  HGB 12.2 11.7*  HCT 35.9 34.6*  WBC 7.2 7.8  PLT 178 189   Ct Chest W Contrast  Result Date: 11/13/2016 CLINICAL DATA:  Known history of COPD and gastric ulcer presenting with chest pain and shortness of breath. Symptoms started yesterday. Heavy pressure on chest and shortness of breath. Dry cough. Follow-up thoracentesis. EXAM: CT CHEST WITH CONTRAST TECHNIQUE: Multidetector CT imaging of the chest was performed during intravenous contrast administration. CONTRAST:  79mL ISOVUE-300 IOPAMIDOL (ISOVUE-300) INJECTION 61% COMPARISON:  Ultrasound thoracentesis earlier today. Chest x-ray dated 11/12/2016. Chest CT dated 07/24/2008. FINDINGS: Cardiovascular: Aortic atherosclerosis. No aortic aneurysm. Heart size is within normal limits. No pericardial effusion. Coronary artery calcifications noted. Mediastinum/Nodes: No enlarged or morphologically abnormal lymph nodes are identified within the mediastinum or perihilar regions. Esophagus appears normal. Trachea and central bronchi are unremarkable. Lungs/Pleura: Peripheral cavitary mass/consolidation within the lateral aspects of the left lower lobe, measuring 3.9 x 2.4 x 2.7 cm (series 3, image 104), with some surrounding atelectasis. Additional irregular masslike consolidation within the posterior-medial aspects of the left lower lobe, measuring 2.4 x 1.5 cm, (axial series 3, image 82 ; more  suspicious appearance on sagittal reconstruction series 6, image 107). This could represent neoplastic mass or conglomerate atelectasis. Lastly, masslike consolidation within the left infrahilar lower lobe, measuring 3.7 x 1.8 x 3.3 cm (series 3, image 87; also more suspicious appearance on sagittal reconstruction series 6, image 114). This also could represent additional neoplastic process or conglomerate atelectasis. The left-sided pleural effusion seen on earlier chest x-ray has been removed. Right lung is clear.  No pneumothorax. Upper Abdomen: Bilateral adrenal masses, measuring 2.8 x 1.9 cm on left and 1.8 x 1.3 cm on the right. Probable cirrhosis of the liver, incompletely imaged. Musculoskeletal: No acute or suspicious osseous finding. Mild degenerative spurring within the thoracic spine. IMPRESSION: 1. Cavitary mass/consolidation within the lateral aspects of the left lower lobe, measuring 3.9 cm, highly suspicious for neoplastic mass, less likely cavitary pneumonia. Recommend tissue sampling. This appears amenable to percutaneous biopsy. 2. Additional irregular masslike consolidation within the posterior-medial aspects of the left lower lobe, measuring 2.4 cm, most suspicious appearance on sagittal reconstructions (image numbers provided above), neoplastic mass versus conglomerate atelectasis. FDG PET-CT may be helpful for differentiation if needed. 3. Additional dense masslike consolidation within the central aspects of the left lower lobe, infrahilar, measuring 3.7 cm, also most suspicious appearance on sagittal reconstructions (image numbers provided above), also compatible with neoplastic mass versus conglomerate atelectasis. Again, FDG PET-CT may be helpful for differentiation. 4. No residual pleural effusion status post today's thoracentesis. 5. Bilateral adrenal masses, suspicious for adrenal metastases.  6. No evidence of mediastinal lymphadenopathy. 7. Aortic atherosclerosis. These results were  called by telephone at the time of interpretation on 11/13/2016 at 3:25 pm to Dr. Fritzi Mandes , who verbally acknowledged these results. Electronically Signed   By: Franki Cabot M.D.   On: 11/13/2016 15:29   US Thoracentesis Asp Pleural Space W/img Guide  Result Date: 11/13/2016 CLINICAL DATA:  Large left pleural effusion. EXAM: ULTRASOUND GUIDED LEFT THORACENTESIS COMPARISON:  None. PROCEDURE: An ultrasound guided thoracentesis was thoroughly discussed with the patient and questions answered. The benefits, risks, alternatives and complications were also discussed. The patient understands and wishes to proceed with the procedure. Written consent was obtained. A time-out was performed prior to the procedure. Ultrasound was performed to localize and mark an adequate pocket of fluid in the left chest. The area was then prepped and draped in the normal sterile fashion. 1% Lidocaine was used for local anesthesia. Under ultrasound guidance a 6 French Safe-T-Centesis catheter was introduced. Thoracentesis was performed. The catheter was removed and a dressing applied. COMPLICATIONS: None FINDINGS: A total of approximately 1.7 L of yellowish colored fluid was removed. A fluid sample was sent for laboratory analysis. IMPRESSION: Successful ultrasound guided left thoracentesis yielding 1.7 L of pleural fluid. Electronically Signed   By: Aletta Edouard M.D.   On: 11/13/2016 15:07    ASSESSMENT / PLAN:  Afib with RVR Shortness of breath secondary to pleural effussion  Lung masses COPD Tobacco Abuse Hypokalemia  -Oncology consulted , appreciate input(Please refer to the notes) -Cardiology consulted, input appreciated -Continue Heparin gtt -Ambulate to assess for heart rate control -Replete potassium to goal > 4.0 -Add magnesium to goal > 2.0 -TSH normal -TTE pending - F/U echo   Tyris Eliot,AG-ACNP Pulmonary and [place]   11/14/2016, 10:33 PM

## 2016-11-14 NOTE — Progress Notes (Signed)
ANTICOAGULATION CONSULT NOTE - Initial Consult  Pharmacy Consult for Heparin Drip Indication: atrial fibrillation  Allergies  Allergen Reactions  . Sulfa Antibiotics     Patient Measurements: Height: 5\' 3"  (160 cm) Weight: 171 lb 15.3 oz (78 kg) IBW/kg (Calculated) : 52.4 Heparin Dosing Weight: 69kg  Vital Signs: Temp: 98.8 F (37.1 C) (08/28 1150) Temp Source: Oral (08/28 1150) BP: 126/69 (08/28 0927) Pulse Rate: 109 (08/28 1150)  Labs:  Recent Labs  11/12/16 1439 11/12/16 1448 11/13/16 0523  HGB 12.2  --  11.7*  HCT 35.9  --  34.6*  PLT 178  --  189  APTT  --  28  --   LABPROT  --  13.2  --   INR  --  1.00  --   CREATININE 0.64  --  0.74  TROPONINI <0.03  --   --     Estimated Creatinine Clearance: 53.6 mL/min (by C-G formula based on SCr of 0.74 mg/dL).   Medical History: Past Medical History:  Diagnosis Date  . COPD (chronic obstructive pulmonary disease) ([place])   . GU (gastric peptic ulcer)      Assessment: 83 yo female with A. Fib with RVR. Pharmacy consulted for heparin drip dosing and monitoring.   Goal of Therapy:  Heparin level 0.3-0.7 units/ml Monitor platelets by anticoagulation protocol: Yes   Plan:  Patient was on enoxaparin 40mg  daily. Will not give full 3500u bolus.  Dosing weight: 69kg  Give 2000 units bolus x 1 Start heparin infusion at 950 units/hr Check anti-Xa level in 8 hours and daily while on heparin Continue to monitor H&H and platelets  Pernell Dupre, PharmD, BCPS Clinical Pharmacist 11/14/2016 11:56 AM

## 2016-11-14 NOTE — Progress Notes (Signed)
*  PRELIMINARY RESULTS* Echocardiogram 2D Echocardiogram has been performed.  [name] 11/14/2016, 3:13 PM

## 2016-11-14 NOTE — Progress Notes (Signed)
Called by RN that pt's HR is still in the 140's. Will transfer to ICU step down for IV cardizem gtt and IV heparin gtt Spoke with Dr Mortimer Fries D/w [place] Butts, [place]. Place on heparin gtt just incase DCCV needs to be considered. Echo ordered.  15 mins

## 2016-11-14 NOTE — Progress Notes (Signed)
[facility] [place] NAME: [name]    MR#:  [id number]  DATE OF BIRTH:  [birth year]  SUBJECTIVE:  Came in with increasing sob and chest tightness Feels better after Thoracentesis  REVIEW OF SYSTEMS:   Review of Systems  Constitutional: Negative for chills, fever and weight loss.  HENT: Negative for ear discharge, ear pain and nosebleeds.   Eyes: Negative for blurred vision, pain and discharge.  Respiratory: Positive for shortness of breath. Negative for sputum production, wheezing and stridor.   Cardiovascular: Negative for chest pain, palpitations, orthopnea and PND.  Gastrointestinal: Positive for diarrhea. Negative for abdominal pain, nausea and vomiting.  Genitourinary: Negative for frequency and urgency.  Musculoskeletal: Negative for back pain and joint pain.  Neurological: Positive for weakness. Negative for sensory change, speech change and focal weakness.  Psychiatric/Behavioral: Negative for depression and hallucinations. The patient is not nervous/anxious.    Tolerating Diet:yes Tolerating PT: ambulatory  DRUG ALLERGIES:   Allergies  Allergen Reactions  . Sulfa Antibiotics     VITALS:  Blood pressure (!) 105/47, pulse (!) 115, temperature 97.6 F (36.4 C), temperature source Oral, resp. rate 18, height 5\' 3"  (1.6 m), weight 78.9 kg (174 lb), SpO2 95 %.  PHYSICAL EXAMINATION:   Physical Exam  GENERAL:  83 y.o.-year-old patient lying in the bed with no acute distress.  EYES: Pupils equal, round, reactive to light and accommodation. No scleral icterus. Extraocular muscles intact.  HEENT: Head atraumatic, normocephalic. Oropharynx and nasopharynx clear.  NECK:  Supple, no jugular venous distention. No thyroid enlargement, no tenderness.  LUNGS: decreased breath sounds bilaterally left > right, no wheezing, rales, rhonchi. No use of accessory muscles of respiration.  CARDIOVASCULAR: S1, S2 normal. No murmurs,  rubs, or gallops.  ABDOMEN: Soft, nontender, nondistended. Bowel sounds present. No organomegaly or mass.  EXTREMITIES: No cyanosis, clubbing or edema b/l.    NEUROLOGIC: Cranial nerves II through XII are intact. No focal Motor or sensory deficits b/l.   PSYCHIATRIC:  patient is alert and oriented x 3.  SKIN: No obvious rash, lesion, or ulcer.   LABORATORY PANEL:  CBC  Recent Labs Lab 11/13/16 0523  WBC 7.8  HGB 11.7*  HCT 34.6*  PLT 189    Chemistries   Recent Labs Lab 11/12/16 1439 11/13/16 0523  NA 144 141  K 3.1* 3.3*  CL 108 107  CO2 28 24  GLUCOSE 102* 199*  BUN 11 15  CREATININE 0.64 0.74  CALCIUM 8.8* 8.6*  AST 18  --   ALT 11*  --   ALKPHOS 74  --   BILITOT 0.6  --    Cardiac Enzymes  Recent Labs Lab 11/12/16 1439  TROPONINI <0.03   RADIOLOGY:  Ct Chest W Contrast  Result Date: 11/13/2016 CLINICAL DATA:  Known history of COPD and gastric ulcer presenting with chest pain and shortness of breath. Symptoms started yesterday. Heavy pressure on chest and shortness of breath. Dry cough. Follow-up thoracentesis. EXAM: CT CHEST WITH CONTRAST TECHNIQUE: Multidetector CT imaging of the chest was performed during intravenous contrast administration. CONTRAST:  28mL ISOVUE-300 IOPAMIDOL (ISOVUE-300) INJECTION 61% COMPARISON:  Ultrasound thoracentesis earlier today. Chest x-ray dated 11/12/2016. Chest CT dated 07/24/2008. FINDINGS: Cardiovascular: Aortic atherosclerosis. No aortic aneurysm. Heart size is within normal limits. No pericardial effusion. Coronary artery calcifications noted. Mediastinum/Nodes: No enlarged or morphologically abnormal lymph nodes are identified within the mediastinum or perihilar regions. Esophagus appears normal. Trachea and central bronchi are unremarkable.  Lungs/Pleura: Peripheral cavitary mass/consolidation within the lateral aspects of the left lower lobe, measuring 3.9 x 2.4 x 2.7 cm (series 3, image 104), with some surrounding  atelectasis. Additional irregular masslike consolidation within the posterior-medial aspects of the left lower lobe, measuring 2.4 x 1.5 cm, (axial series 3, image 82 ; more suspicious appearance on sagittal reconstruction series 6, image 107). This could represent neoplastic mass or conglomerate atelectasis. Lastly, masslike consolidation within the left infrahilar lower lobe, measuring 3.7 x 1.8 x 3.3 cm (series 3, image 87; also more suspicious appearance on sagittal reconstruction series 6, image 114). This also could represent additional neoplastic process or conglomerate atelectasis. The left-sided pleural effusion seen on earlier chest x-ray has been removed. Right lung is clear.  No pneumothorax. Upper Abdomen: Bilateral adrenal masses, measuring 2.8 x 1.9 cm on left and 1.8 x 1.3 cm on the right. Probable cirrhosis of the liver, incompletely imaged. Musculoskeletal: No acute or suspicious osseous finding. Mild degenerative spurring within the thoracic spine. IMPRESSION: 1. Cavitary mass/consolidation within the lateral aspects of the left lower lobe, measuring 3.9 cm, highly suspicious for neoplastic mass, less likely cavitary pneumonia. Recommend tissue sampling. This appears amenable to percutaneous biopsy. 2. Additional irregular masslike consolidation within the posterior-medial aspects of the left lower lobe, measuring 2.4 cm, most suspicious appearance on sagittal reconstructions (image numbers provided above), neoplastic mass versus conglomerate atelectasis. FDG PET-CT may be helpful for differentiation if needed. 3. Additional dense masslike consolidation within the central aspects of the left lower lobe, infrahilar, measuring 3.7 cm, also most suspicious appearance on sagittal reconstructions (image numbers provided above), also compatible with neoplastic mass versus conglomerate atelectasis. Again, FDG PET-CT may be helpful for differentiation. 4. No residual pleural effusion status post today's  thoracentesis. 5. Bilateral adrenal masses, suspicious for adrenal metastases. 6. No evidence of mediastinal lymphadenopathy. 7. Aortic atherosclerosis. These results were called by telephone at the time of interpretation on 11/13/2016 at 3:25 pm to Dr. Fritzi Mandes , who verbally acknowledged these results. Electronically Signed   By: Franki Cabot M.D.   On: 11/13/2016 15:29   Dg Chest Portable 1 View  Result Date: 11/12/2016 CLINICAL DATA:  Shortness of Breath EXAM: PORTABLE CHEST 1 VIEW COMPARISON:  09/26/2013 FINDINGS: Cardiac shadow is enlarged. Large left-sided pleural effusion is noted. There is likely underlying atelectasis present. The right lung is clear. No bony abnormality is noted. IMPRESSION: Large left pleural effusion. Electronically Signed   By: Inez Catalina M.D.   On: 11/12/2016 15:04   US Thoracentesis Asp Pleural Space W/img Guide  Result Date: 11/13/2016 CLINICAL DATA:  Large left pleural effusion. EXAM: ULTRASOUND GUIDED LEFT THORACENTESIS COMPARISON:  None. PROCEDURE: An ultrasound guided thoracentesis was thoroughly discussed with the patient and questions answered. The benefits, risks, alternatives and complications were also discussed. The patient understands and wishes to proceed with the procedure. Written consent was obtained. A time-out was performed prior to the procedure. Ultrasound was performed to localize and mark an adequate pocket of fluid in the left chest. The area was then prepped and draped in the normal sterile fashion. 1% Lidocaine was used for local anesthesia. Under ultrasound guidance a 6 French Safe-T-Centesis catheter was introduced. Thoracentesis was performed. The catheter was removed and a dressing applied. COMPLICATIONS: None FINDINGS: A total of approximately 1.7 L of yellowish colored fluid was removed. A fluid sample was sent for laboratory analysis. IMPRESSION: Successful ultrasound guided left thoracentesis yielding 1.7 L of pleural fluid. Electronically  Signed   By:  Aletta Edouard M.D.   On: 11/13/2016 15:07   ASSESSMENT AND PLAN:  [name]  is a 83 y.o. female with a known history of  COPD and gastric ulcer presenting with chest pain and shortness of breath. She reports that her symptoms started since yesterday. She states that it feels like heavy pressure on the chest and is feeling short of breath. Patient also has had a dry cough.  1. Shortness of breath due to large left-sided pleural effusion s/p Thoracentesis with removal of 1.7 liters  -Fluid cytology pending -CT scan of the chest shows multiple  lung masses highly suspicious from lung cancer -seen by Dr Grayland Ormond and will get w/u as out pt - on nebulizers and steroids due to smoking history and a questionable COPD  2. Hypokalemia We'll replace potassium and check Magnesium  3.Aib with RVR---new onset -start on po cardizem 60 mg q 8 hourly -spoke with Ryan,PA -echo ordered  4. History of IBS Has been having runny stools lately. Send GI PCR panel  5. Nicotine abuse smoking cessation provided 30minutes spent strongly recommended she stop smoking patient will be started on a nicotine patch    Case discussed with Care Management/Social Worker. Management plans discussed with the patient, family and they are in agreement.  CODE STATUS: FULL  DVT Prophylaxis: lovenox  TOTAL TIME TAKING CARE OF THIS PATIENT: *30* minutes.  >50% time spent on counselling and coordination of care  POSSIBLE D/C IN 1-2 DAYS, DEPENDING ON CLINICAL CONDITION.  Note: This dictation was prepared with Dragon dictation along with smaller phrase technology. Any transcriptional errors that result from this process are unintentional.  Jamese Trauger M.D on 11/14/2016 at 8:23 AM  Between 7am to 6pm - Pager - [phone]  After 6pm go to [email or web address] - password [email or web address] [facility]  Office  [phone]  CC: Primary care physician; Albina Billet, MD

## 2016-11-14 NOTE — Progress Notes (Signed)
RN spoke with Dr. Saunders Revel in person and made MD aware that cardizem drip has not been started because patient's heart rate has remained mostly in the 90's.  Dr. Saunders Revel stated "I will put in an order for some oral cardizem and discontinue the drip."

## 2016-11-14 NOTE — Progress Notes (Signed)
Pt was placed on xopnex due to increased HR.

## 2016-11-14 NOTE — Progress Notes (Signed)
Dr. Saunders Revel at bedside and gave order to use cardizem drip to keep heart rate consistently less than 100.

## 2016-11-15 DIAGNOSIS — R0600 Dyspnea, unspecified: Secondary | ICD-10-CM

## 2016-11-15 DIAGNOSIS — I4891 Unspecified atrial fibrillation: Secondary | ICD-10-CM

## 2016-11-15 DIAGNOSIS — C3490 Malignant neoplasm of unspecified part of unspecified bronchus or lung: Secondary | ICD-10-CM

## 2016-11-15 DIAGNOSIS — I481 Persistent atrial fibrillation: Secondary | ICD-10-CM

## 2016-11-15 LAB — CYTOLOGY - NON PAP

## 2016-11-15 LAB — BASIC METABOLIC PANEL
ANION GAP: 5 (ref 5–15)
BUN: 24 mg/dL — ABNORMAL HIGH (ref 6–20)
CALCIUM: 9 mg/dL (ref 8.9–10.3)
CO2: 27 mmol/L (ref 22–32)
Chloride: 109 mmol/L (ref 101–111)
Creatinine, Ser: 0.82 mg/dL (ref 0.44–1.00)
Glucose, Bld: 114 mg/dL — ABNORMAL HIGH (ref 65–99)
Potassium: 4.7 mmol/L (ref 3.5–5.1)
Sodium: 141 mmol/L (ref 135–145)

## 2016-11-15 LAB — CBC
HCT: 36.4 % (ref 35.0–47.0)
HEMOGLOBIN: 12.3 g/dL (ref 12.0–16.0)
MCH: 28.1 pg (ref 26.0–34.0)
MCHC: 33.9 g/dL (ref 32.0–36.0)
MCV: 82.7 fL (ref 80.0–100.0)
PLATELETS: 204 10*3/uL (ref 150–440)
RBC: 4.4 MIL/uL (ref 3.80–5.20)
RDW: 14.9 % — ABNORMAL HIGH (ref 11.5–14.5)
WBC: 11.8 10*3/uL — AB (ref 3.6–11.0)

## 2016-11-15 LAB — MAGNESIUM: Magnesium: 2.1 mg/dL (ref 1.7–2.4)

## 2016-11-15 LAB — ACID FAST SMEAR (AFB, MYCOBACTERIA): Acid Fast Smear: NEGATIVE

## 2016-11-15 LAB — HEPARIN LEVEL (UNFRACTIONATED): HEPARIN UNFRACTIONATED: 0.49 [IU]/mL (ref 0.30–0.70)

## 2016-11-15 MED ORDER — DILTIAZEM HCL ER COATED BEADS 120 MG PO CP24
120.0000 mg | ORAL_CAPSULE | Freq: Every day | ORAL | Status: DC
  Administered 2016-11-15 – 2016-11-16 (×2): 120 mg via ORAL
  Filled 2016-11-15 (×2): qty 1

## 2016-11-15 MED ORDER — AMIODARONE HCL 200 MG PO TABS
400.0000 mg | ORAL_TABLET | Freq: Two times a day (BID) | ORAL | Status: DC
Start: 2016-11-15 — End: 2016-11-16
  Administered 2016-11-15 – 2016-11-16 (×2): 400 mg via ORAL
  Filled 2016-11-15 (×2): qty 2

## 2016-11-15 MED ORDER — LEVALBUTEROL HCL 0.63 MG/3ML IN NEBU
0.6300 mg | INHALATION_SOLUTION | Freq: Three times a day (TID) | RESPIRATORY_TRACT | Status: DC
  Administered 2016-11-16 (×2): 0.63 mg via RESPIRATORY_TRACT
  Filled 2016-11-15 (×2): qty 3

## 2016-11-15 MED ORDER — AMIODARONE HCL 200 MG PO TABS
400.0000 mg | ORAL_TABLET | Freq: Once | ORAL | Status: AC
  Administered 2016-11-15: 400 mg via ORAL
  Filled 2016-11-15: qty 2

## 2016-11-15 MED ORDER — APIXABAN 5 MG PO TABS
5.0000 mg | ORAL_TABLET | Freq: Two times a day (BID) | ORAL | Status: DC
  Administered 2016-11-15 – 2016-11-16 (×2): 5 mg via ORAL
  Filled 2016-11-15 (×2): qty 1

## 2016-11-15 MED ORDER — RIVAROXABAN 20 MG PO TABS
20.0000 mg | ORAL_TABLET | Freq: Every day | ORAL | Status: DC
  Filled 2016-11-15: qty 1

## 2016-11-15 MED ORDER — LEVALBUTEROL HCL 0.63 MG/3ML IN NEBU
INHALATION_SOLUTION | RESPIRATORY_TRACT | Status: AC
  Filled 2016-11-15: qty 3

## 2016-11-15 NOTE — Progress Notes (Signed)
[facility] at [place] NAME: [name]    MR#:  [id number]  DATE OF BIRTH:  10-29-1932  SUBJECTIVE:  Came in with increasing sob and chest tightness Feels better after Thoracentesis   Had A fib with RVR, so moved to [facility] unit. HR is controlled with oral cardizem. Also started on eliquis. Today again on ambulation had HR up to 150- started on oral amio as per cardio.  REVIEW OF SYSTEMS:   Review of Systems  Constitutional: Negative for chills, fever and weight loss.  HENT: Negative for ear discharge, ear pain and nosebleeds.   Eyes: Negative for blurred vision, pain and discharge.  Respiratory: Positive for shortness of breath. Negative for sputum production, wheezing and stridor.   Cardiovascular: Negative for chest pain, palpitations, orthopnea and PND.  Gastrointestinal: Positive for diarrhea. Negative for abdominal pain, nausea and vomiting.  Genitourinary: Negative for frequency and urgency.  Musculoskeletal: Negative for back pain and joint pain.  Neurological: Positive for weakness. Negative for sensory change, speech change and focal weakness.  Psychiatric/Behavioral: Negative for depression and hallucinations. The patient is not nervous/anxious.    Tolerating Diet:yes Tolerating PT: ambulatory  DRUG ALLERGIES:   Allergies  Allergen Reactions  . Sulfa Antibiotics     VITALS:  Blood pressure 111/60, pulse (!) 101, temperature 97.9 F (36.6 C), temperature source Oral, resp. rate 18, height 5\' 3"  (1.6 m), weight 78 kg (171 lb 15.3 oz), SpO2 98 %.  PHYSICAL EXAMINATION:   Physical Exam  GENERAL:  83 y.o.-year-old patient lying in the bed with no acute distress.  EYES: Pupils equal, round, reactive to light and accommodation. No scleral icterus. Extraocular muscles intact.  HEENT: Head atraumatic, normocephalic. Oropharynx and nasopharynx clear.  NECK:  Supple, no jugular venous distention. No thyroid enlargement,  no tenderness.  LUNGS: decreased breath sounds bilaterally left > right, no wheezing, rales, rhonchi. No use of accessory muscles of respiration.  CARDIOVASCULAR: S1, S2 normal. No murmurs, rubs, or gallops.  ABDOMEN: Soft, nontender, nondistended. Bowel sounds present. No organomegaly or mass.  EXTREMITIES: No cyanosis, clubbing or edema b/l.    NEUROLOGIC: Cranial nerves II through XII are intact. No focal Motor or sensory deficits b/l.   PSYCHIATRIC:  patient is alert and oriented x 3.  SKIN: No obvious rash, lesion, or ulcer.   LABORATORY PANEL:  CBC  Recent Labs Lab 11/15/16 0551  WBC 11.8*  HGB 12.3  HCT 36.4  PLT 204    Chemistries   Recent Labs Lab 11/12/16 1439  11/15/16 0551  NA 144  < > 141  K 3.1*  < > 4.7  CL 108  < > 109  CO2 28  < > 27  GLUCOSE 102*  < > 114*  BUN 11  < > 24*  CREATININE 0.64  < > 0.82  CALCIUM 8.8*  < > 9.0  MG  --   < > 2.1  AST 18  --   --   ALT 11*  --   --   ALKPHOS 74  --   --   BILITOT 0.6  --   --   < > = values in this interval not displayed. Cardiac Enzymes  Recent Labs Lab 11/12/16 1439  TROPONINI <0.03   RADIOLOGY:  No results found. ASSESSMENT AND PLAN:  [name]  is a 83 y.o. female with a known history of  COPD and gastric ulcer presenting with chest pain and shortness of breath.  She reports that her symptoms started since yesterday. She states that it feels like heavy pressure on the chest and is feeling short of breath. Patient also has had a dry cough.  1. Shortness of breath due to large left-sided pleural effusion s/p Thoracentesis with removal of 1.7 liters  -Fluid cytology reporting no malignancy. -CT scan of the chest shows multiple  lung masses highly suspicious from lung cancer -seen by Dr Grayland Ormond and will get w/u as out pt ( PET scan and Biopsy of lung nodule) - on nebulizers and steroids due to smoking history and a questionable COPD  2. Hypokalemia Replace.  3.Aib with RVR---new onset - on  po cardizem long acting. - with tachycardia again, oral amio added. - echo - EF 70%  4. History of IBS Ordered GI panel, but did not have BM in 2 days, so did not send.  5. Nicotine abuse smoking cessation provided 64minutes spent strongly recommended she stop smoking patient started on a nicotine patch   Case discussed with Care Management/Social Worker. Management plans discussed with the patient, family and they are in agreement.  CODE STATUS: FULL  DVT Prophylaxis: lovenox  TOTAL TIME TAKING CARE OF THIS PATIENT: *30* minutes.  >50% time spent on counselling and coordination of care  POSSIBLE D/C IN 1-2 DAYS, DEPENDING ON CLINICAL CONDITION.  Note: This dictation was prepared with Dragon dictation along with smaller phrase technology. Any transcriptional errors that result from this process are unintentional.  [name] M.D on 11/15/2016 at 8:15 PM  Between 7am to 6pm - Pager - [phone]  After 6pm go to [email or web address] - password [email or web address] [facility]  Office  [phone]  CC: Primary care physician; Albina Billet, MD

## 2016-11-15 NOTE — Progress Notes (Signed)
Report called to Richardson Landry, RN on 2A.  Patient has been A&Ox4.  Denies pain.  VSS.  Tolerated ambulation well except at one point patient's heart rate got up to 150 therefore Dr. Rockey Situ wants patient to stay one more night.

## 2016-11-15 NOTE — Care Management (Addendum)
Xarelto 20 mg daily #30 cost with [facility] [phone] $242.  Per [facility] they can provide medication with $0 co-pay coupon which has been provided to patient. Patient notified of cost and patient concerned with cost. Eliquis 5 mg BID #60 cost $242.00. Dr. Rockey Situ paged.  Patient wants to take Coumadin- she said her brother took it.  I explained that blood labs would need to be monitored. Dr. Donivan Scull nurse Pam with Dr. Rockey Situ will check with him. Eliquis will be plan of treatment per Pam with Dr. Rockey Situ. 30 trial coupon provided to patient; patient to follow up with Dr. Rockey Situ regarding refills.Patient is happy with this option.

## 2016-11-15 NOTE — Progress Notes (Signed)
Dr. Rockey Situ gave verbal order to transfer patient to 2A and to not give 2000 dose of amiodarone since 2nd dose was given around 1500 as a one time order.  Order also given for eliquis 5mg  BID.

## 2016-11-15 NOTE — Progress Notes (Signed)
ANTICOAGULATION CONSULT NOTE - Initial Consult  Pharmacy Consult for Heparin Drip Indication: atrial fibrillation  Allergies  Allergen Reactions  . Sulfa Antibiotics     Patient Measurements: Height: 5\' 3"  (160 cm) Weight: 171 lb 15.3 oz (78 kg) IBW/kg (Calculated) : 52.4 Heparin Dosing Weight: 69kg  Vital Signs: Temp: 97.8 F (36.6 C) (08/29 0500) Temp Source: Oral (08/29 0500) BP: 109/53 (08/29 0600) Pulse Rate: 94 (08/29 0600)  Labs:  Recent Labs  11/12/16 1439 11/12/16 1448 11/13/16 0523 11/14/16 2023 11/15/16 0551  HGB 12.2  --  11.7*  --  12.3  HCT 35.9  --  34.6*  --  36.4  PLT 178  --  189  --  204  APTT  --  28  --   --   --   LABPROT  --  13.2  --   --   --   INR  --  1.00  --   --   --   HEPARINUNFRC  --   --   --  0.24* 0.49  CREATININE 0.64  --  0.74  --   --   TROPONINI <0.03  --   --   --   --     Estimated Creatinine Clearance: 53.6 mL/min (by C-G formula based on SCr of 0.74 mg/dL).   Medical History: Past Medical History:  Diagnosis Date  . COPD (chronic obstructive pulmonary disease) ([place])   . GU (gastric peptic ulcer)      Assessment: 83 yo female with A. Fib with RVR. Pharmacy consulted for heparin drip dosing and monitoring.   Goal of Therapy:  Heparin level 0.3-0.7 units/ml Monitor platelets by anticoagulation protocol: Yes   Plan:  Patient was on enoxaparin 40mg  daily. Will not give full 3500u bolus.  Dosing weight: 69kg  Give 2000 units bolus x 1 Start heparin infusion at 950 units/hr Check anti-Xa level in 8 hours and daily while on heparin Continue to monitor H&H and platelets  8/28 2023 HL subtherapeutic x 1. 1050 units IV x 1 bolus and increase rate to 1050 units/hr. Will recheck HL in 8 hours.  8/29 0600 HL = 0.49 Will continue this pt on current rate and recheck HL in 8 hrs on 8/29 @ 14:00.   Orene Desanctis, PharmD Clinical Pharmacist 11/15/2016 6:42 AM

## 2016-11-15 NOTE — Discharge Instructions (Signed)
Information on my medicine - XARELTO (Rivaroxaban)  This medication education was reviewed with me or my healthcare representative as part of my discharge preparation.  The pharmacist that spoke with me during my hospital stay was:  Napoleon Form [facility]  Why was Xarelto prescribed for you? Xarelto was prescribed for you to reduce the risk of a blood clot forming that can cause a stroke if you have a medical condition called atrial fibrillation (a type of irregular heartbeat).  What do you need to know about xarelto ? Take your Xarelto ONCE DAILY at the same time every day with your evening meal. If you have difficulty swallowing the tablet whole, you may crush it and mix in applesauce just prior to taking your dose.  Take Xarelto exactly as prescribed by your doctor and DO NOT stop taking Xarelto without talking to the doctor who prescribed the medication.  Stopping without other stroke prevention medication to take the place of Xarelto may increase your risk of developing a clot that causes a stroke.  Refill your prescription before you run out.  After discharge, you should have regular check-up appointments with your healthcare provider that is prescribing your Xarelto.  In the future your dose may need to be changed if your kidney function or weight changes by a significant amount.  What do you do if you miss a dose? If you are taking Xarelto ONCE DAILY and you miss a dose, take it as soon as you remember on the same day then continue your regularly scheduled once daily regimen the next day. Do not take two doses of Xarelto at the same time or on the same day.   Important Safety Information A possible side effect of Xarelto is bleeding. You should call your healthcare provider right away if you experience any of the following: ? Bleeding from an injury or your nose that does not stop. ? Unusual colored urine (red or dark brown) or unusual colored stools (red or black). ? Unusual  bruising for unknown reasons. ? A serious fall or if you hit your head (even if there is no bleeding).  Some medicines may interact with Xarelto and might increase your risk of bleeding while on Xarelto. To help avoid this, consult your healthcare provider or pharmacist prior to using any new prescription or non-prescription medications, including herbals, vitamins, non-steroidal anti-inflammatory drugs (NSAIDs) and supplements.  This website has more information on Xarelto: [email or web address]

## 2016-11-15 NOTE — Progress Notes (Addendum)
Received call that patients heart rate was up to 150 with ambulation. Dr. Rockey Situ entered additional order for amiodarone dose.   Case management also called regarding options due to expense of medication. Per Dr. Rockey Situ patient to start on Eliquis 5  Mg twice daily and when she is ready for discharge we have 1 month of samples and a card for a free 30 day prescription until she comes in for follow up here in the office to discuss further options. We also have assistance forms available for patient to see if she qualifies for further assistance. Will place samples, discount card, and assistance form up front.   Reviewed all information with patients nurse and she verbalized understanding with no further questions.    Medication Samples placed up front.  Drug name: Eliquis       Strength: 5 mg        Qty: 4 boxes  LOT: [id number]  Exp.Date: Dec 2020  Dosing instructions: One tablet twice a day

## 2016-11-15 NOTE — Progress Notes (Signed)
Patient Name: [name] Date of Encounter: 11/15/2016  Primary Cardiologist: New to [facility] - consult by End  Hospital Problem List     Active Problems:   SOB (shortness of breath)   Atrial fibrillation ([place])     Subjective   Transferred to ICU on 8/28 secondary to Afib with RVR. Vitals stable. Asymptomatic. Never required diltiazem gtt. Continue on short-acting diltiazem. Rates improved this morning in the 80s bpm, remains in Afib. Tolerating heparin gtt. Plans for outpatient PET scan and potential biopsies. TTE as below.   Inpatient Medications    Scheduled Meds: . benzonatate  100 mg Oral TID  . cholecalciferol  1,000 Units Oral Daily  . diltiazem  120 mg Oral Daily  . feeding supplement (ENSURE ENLIVE)  237 mL Oral Q24H  . guaiFENesin  600 mg Oral BID  . levalbuterol      . levalbuterol  0.63 mg Nebulization Q6H  . loratadine  10 mg Oral Daily  . nicotine  21 mg Transdermal Daily  . pantoprazole  40 mg Oral QAC breakfast  . predniSONE  50 mg Oral Q breakfast  . ramelteon  8 mg Oral QHS  . rivaroxaban  20 mg Oral Q supper  . sodium chloride flush  3 mL Intravenous Q12H   Continuous Infusions: . sodium chloride     PRN Meds: sodium chloride, acetaminophen **OR** acetaminophen, ondansetron **OR** ondansetron (ZOFRAN) IV, sodium chloride flush   Vital Signs    Vitals:   11/15/16 0400 11/15/16 0500 11/15/16 0600 11/15/16 0700  BP: 104/64 103/64 (!) 109/53 (!) 105/58  Pulse: 91 88 94 88  Resp: 16 16 20 20   Temp:  97.8 F (36.6 C)    TempSrc:  Oral    SpO2: 96% 97% 97% 95%  Weight:      Height:        Intake/Output Summary (Last 24 hours) at 11/15/16 0731 Last data filed at 11/15/16 0700  Gross per 24 hour  Intake            638.3 ml  Output                0 ml  Net            638.3 ml   Filed Weights   11/12/16 1440 11/14/16 1150  Weight: 174 lb (78.9 kg) 171 lb 15.3 oz (78 kg)    Physical Exam    GEN: Well nourished, well developed, in no  acute distress.  HEENT: Grossly normal.  Neck: Supple, no JVD, carotid bruits, or masses. Cardiac: Irregularly irregular, no murmurs, rubs, or gallops. No clubbing, cyanosis, edema.  Radials/DP/PT 2+ and equal bilaterally.  Respiratory:  Respirations regular and unlabored, clear to auscultation bilaterally. GI: Soft, nontender, nondistended, BS + x 4. MS: no deformity or atrophy. Skin: warm and dry, no rash. Neuro:  Strength and sensation are intact. Psych: AAOx3.  Normal affect.  Labs    CBC  Recent Labs  11/12/16 1439 11/13/16 0523 11/15/16 0551  WBC 7.2 7.8 11.8*  NEUTROABS 5.0  --   --   HGB 12.2 11.7* 12.3  HCT 35.9 34.6* 36.4  MCV 82.0 82.3 82.7  PLT 178 189 102   Basic Metabolic Panel  Recent Labs  11/12/16 1439 11/13/16 0523 11/14/16 0856 11/15/16 0551  NA 144 141  --   --   K 3.1* 3.3* 3.1*  --   CL 108 107  --   --   CO2  28 24  --   --   GLUCOSE 102* 199*  --   --   BUN 11 15  --   --   CREATININE 0.64 0.74  --   --   CALCIUM 8.8* 8.6*  --   --   MG  --   --  1.9 2.1   Liver Function Tests  Recent Labs  11/12/16 1439  AST 18  ALT 11*  ALKPHOS 74  BILITOT 0.6  PROT 6.1*  ALBUMIN 3.2*   No results for input(s): LIPASE, AMYLASE in the last 72 hours. Cardiac Enzymes  Recent Labs  11/12/16 1439  TROPONINI <0.03   BNP Invalid input(s): POCBNP D-Dimer No results for input(s): DDIMER in the last 72 hours. Hemoglobin A1C No results for input(s): HGBA1C in the last 72 hours. Fasting Lipid Panel No results for input(s): CHOL, HDL, LDLCALC, TRIG, CHOLHDL, LDLDIRECT in the last 72 hours. Thyroid Function Tests  Recent Labs  11/12/16 1835  TSH 0.422    Telemetry    Afib, 80s bpm currently, episodes of Afib with RVR into the 120s bpm - Personally Reviewed  ECG    n/a - Personally Reviewed  Radiology    Ct Chest W Contrast  Result Date: 11/13/2016 CLINICAL DATA:  Known history of COPD and gastric ulcer presenting with chest pain  and shortness of breath. Symptoms started yesterday. Heavy pressure on chest and shortness of breath. Dry cough. Follow-up thoracentesis. EXAM: CT CHEST WITH CONTRAST TECHNIQUE: Multidetector CT imaging of the chest was performed during intravenous contrast administration. CONTRAST:  51mL ISOVUE-300 IOPAMIDOL (ISOVUE-300) INJECTION 61% COMPARISON:  Ultrasound thoracentesis earlier today. Chest x-ray dated 11/12/2016. Chest CT dated 07/24/2008. FINDINGS: Cardiovascular: Aortic atherosclerosis. No aortic aneurysm. Heart size is within normal limits. No pericardial effusion. Coronary artery calcifications noted. Mediastinum/Nodes: No enlarged or morphologically abnormal lymph nodes are identified within the mediastinum or perihilar regions. Esophagus appears normal. Trachea and central bronchi are unremarkable. Lungs/Pleura: Peripheral cavitary mass/consolidation within the lateral aspects of the left lower lobe, measuring 3.9 x 2.4 x 2.7 cm (series 3, image 104), with some surrounding atelectasis. Additional irregular masslike consolidation within the posterior-medial aspects of the left lower lobe, measuring 2.4 x 1.5 cm, (axial series 3, image 82 ; more suspicious appearance on sagittal reconstruction series 6, image 107). This could represent neoplastic mass or conglomerate atelectasis. Lastly, masslike consolidation within the left infrahilar lower lobe, measuring 3.7 x 1.8 x 3.3 cm (series 3, image 87; also more suspicious appearance on sagittal reconstruction series 6, image 114). This also could represent additional neoplastic process or conglomerate atelectasis. The left-sided pleural effusion seen on earlier chest x-ray has been removed. Right lung is clear.  No pneumothorax. Upper Abdomen: Bilateral adrenal masses, measuring 2.8 x 1.9 cm on left and 1.8 x 1.3 cm on the right. Probable cirrhosis of the liver, incompletely imaged. Musculoskeletal: No acute or suspicious osseous finding. Mild degenerative  spurring within the thoracic spine. IMPRESSION: 1. Cavitary mass/consolidation within the lateral aspects of the left lower lobe, measuring 3.9 cm, highly suspicious for neoplastic mass, less likely cavitary pneumonia. Recommend tissue sampling. This appears amenable to percutaneous biopsy. 2. Additional irregular masslike consolidation within the posterior-medial aspects of the left lower lobe, measuring 2.4 cm, most suspicious appearance on sagittal reconstructions (image numbers provided above), neoplastic mass versus conglomerate atelectasis. FDG PET-CT may be helpful for differentiation if needed. 3. Additional dense masslike consolidation within the central aspects of the left lower lobe, infrahilar, measuring 3.7 cm, also  most suspicious appearance on sagittal reconstructions (image numbers provided above), also compatible with neoplastic mass versus conglomerate atelectasis. Again, FDG PET-CT may be helpful for differentiation. 4. No residual pleural effusion status post today's thoracentesis. 5. Bilateral adrenal masses, suspicious for adrenal metastases. 6. No evidence of mediastinal lymphadenopathy. 7. Aortic atherosclerosis. These results were called by telephone at the time of interpretation on 11/13/2016 at 3:25 pm to Dr. Fritzi Mandes , who verbally acknowledged these results. Electronically Signed   By: Franki Cabot M.D.   On: 11/13/2016 15:29   US Thoracentesis Asp Pleural Space W/img Guide  Result Date: 11/13/2016 CLINICAL DATA:  Large left pleural effusion. EXAM: ULTRASOUND GUIDED LEFT THORACENTESIS COMPARISON:  None. PROCEDURE: An ultrasound guided thoracentesis was thoroughly discussed with the patient and questions answered. The benefits, risks, alternatives and complications were also discussed. The patient understands and wishes to proceed with the procedure. Written consent was obtained. A time-out was performed prior to the procedure. Ultrasound was performed to localize and mark an  adequate pocket of fluid in the left chest. The area was then prepped and draped in the normal sterile fashion. 1% Lidocaine was used for local anesthesia. Under ultrasound guidance a 6 French Safe-T-Centesis catheter was introduced. Thoracentesis was performed. The catheter was removed and a dressing applied. COMPLICATIONS: None FINDINGS: A total of approximately 1.7 L of yellowish colored fluid was removed. A fluid sample was sent for laboratory analysis. IMPRESSION: Successful ultrasound guided left thoracentesis yielding 1.7 L of pleural fluid. Electronically Signed   By: Aletta Edouard M.D.   On: 11/13/2016 15:07    Cardiac Studies   TTE 11/14/2016: Study Conclusions  - Procedure narrative: Transthoracic echocardiography. The study   was technically difficult. - Left ventricle: The cavity size was normal. Wall thickness was   increased in a pattern of mild LVH. Systolic function was   vigorous. The estimated ejection fraction was in the range of 65%   to 70%. The study is not technically sufficient to allow   evaluation of LV diastolic function. - Right ventricle: Poorly visualized. Systolic function was grossly   normal.  Patient Profile     83 y.o. female with history of COPD 2/2 ongoing tobacco abuse and IBS who presented to [facility] with 2 day history of SOB. She was found to have likely newly diagnosed lung cancer with malignant pleural effusion s/p thoracentesis. She developed new onset Afib with RVR on the morning of 8/28.  Assessment & Plan    1. New onset Afib with RVR: -Rates well controlled this morning -Remains in Afib -Consolidate short-acting diltiazem to Cardizem CD 120 mg daily -Ambulate to assess heart rate control -Change heparin gtt to Xarelto given CHADS2VASc of at least 4 (age x 2, vascular disease, female) -Planning for outpatient PET scan and possible biopsies. Oncology will need to contact our office for recommendation on holding anticoagulation prior to any  procedures (no procedures planned as inpatient) -TTE as above  2. SOB with pleural effusion concerning for lung cancer: -Per IM -Will need outpatient oncology follow up with biopsies   3. Hypokalemia: -Check bmet this morning  Signed, Marcille Blanco [place] Pager: [phone] 11/15/2016, 7:31 AM   .Attending Note Patient seen and examined, agree with detailed note above,  Patient presentation and plan discussed on rounds.   Heart rate 100 up to 110 at rest, atrial fibrillation She has been on heparin infusion, change to xarelto this morning She attributes the arrhythmia to profound stress after thoracentesis,  finding out she has lung cancer  On physical exam no JVD, heart irregularly irregular, lungs with mildly decreased breath sounds throughout, abdomen soft nontender, no significant lower extremity edema  Telemetry reviewed showing atrial fibrillation rate 108 bpm  ----New atrial fibrillation with RVR Given she has been on anticoagulation in the past 24 hours, atrial fibrillation for approximately 30 hours, still poorly rate controlled with hypotension on beta blocker, Would recommend we start amiodarone 400 mg twice a day for 5 days then down to 200 mg twice a day Agree with continuing Xarelto 20 mg daily Outpatient follow-up. Consider cardioversion if sinus rhythm not restored  ----Lung cancer Scheduled for PET scan Based off PET scan imaging may need biopsies  Long discussion with her concerning various treatment options for her atrial fibrillation including rate control, cardioversion Greater than 50% was spent in counseling and coordination of care with patient Total encounter time 35 minutes or more   Signed: Esmond Plants  M.D., Ph.D. [facility]

## 2016-11-16 ENCOUNTER — Telehealth: Payer: Self-pay | Admitting: Cardiovascular Disease

## 2016-11-16 LAB — MAGNESIUM: Magnesium: 1.9 mg/dL (ref 1.7–2.4)

## 2016-11-16 MED ORDER — DILTIAZEM HCL 30 MG PO TABS
60.0000 mg | ORAL_TABLET | Freq: Once | ORAL | Status: AC
  Administered 2016-11-16: 60 mg via ORAL
  Filled 2016-11-16: qty 2

## 2016-11-16 MED ORDER — AMIODARONE HCL 200 MG PO TABS: 200.0000 mg | ORAL_TABLET | Freq: Two times a day (BID) | ORAL | 0 refills | Status: DC

## 2016-11-16 MED ORDER — DILTIAZEM HCL ER COATED BEADS 180 MG PO CP24: 180.0000 mg | ORAL_CAPSULE | Freq: Every day | ORAL | 0 refills | Status: DC

## 2016-11-16 MED ORDER — DILTIAZEM HCL ER COATED BEADS 180 MG PO CP24: 180.0000 mg | ORAL_CAPSULE | Freq: Every day | ORAL | Status: DC

## 2016-11-16 MED ORDER — AMIODARONE HCL 400 MG PO TABS: 400.0000 mg | ORAL_TABLET | Freq: Two times a day (BID) | ORAL | 0 refills | Status: DC

## 2016-11-16 MED ORDER — PREDNISONE 10 MG (21) PO TBPK: ORAL_TABLET | ORAL | 0 refills | Status: DC

## 2016-11-16 MED ORDER — APIXABAN 5 MG PO TABS: 5.0000 mg | ORAL_TABLET | Freq: Two times a day (BID) | ORAL | 0 refills | Status: AC

## 2016-11-16 MED ORDER — NICOTINE 21 MG/24HR TD PT24: 21.0000 mg | MEDICATED_PATCH | Freq: Every day | TRANSDERMAL | 0 refills | Status: DC

## 2016-11-16 NOTE — Telephone Encounter (Signed)
TCM.... Pt is being discharged today  Needs a 1 week fu  Saw Dr Rockey Situ and Ignacia Bayley in hospital  Patient is scheduled for 12/15/16  Added to wait list  Please advise

## 2016-11-16 NOTE — Care Management (Signed)
Provided patient with 30 day trial coupon for Eliquis.  She relays that she does have pharmacy coverage but she has not had to have any meds filled except a fluid pill so knows she has not met her deductible.  Discussed that initial times she fills the medication will reflect the deductible that she must meet then it will decrease significantly.  Verbalizes understanding. No other discharge needs

## 2016-11-16 NOTE — Progress Notes (Signed)
Progress Note  Patient Name: [name] Date of Encounter: 11/16/2016  Primary Cardiologist: Andree Coss, MD   Subjective   No chest pain, sob, palpitations.  Hoping to go home this AM.  Inpatient Medications    Scheduled Meds: . amiodarone  400 mg Oral BID  . apixaban  5 mg Oral BID  . benzonatate  100 mg Oral TID  . cholecalciferol  1,000 Units Oral Daily  . diltiazem  120 mg Oral Daily  . feeding supplement (ENSURE ENLIVE)  237 mL Oral Q24H  . guaiFENesin  600 mg Oral BID  . levalbuterol  0.63 mg Nebulization TID  . loratadine  10 mg Oral Daily  . nicotine  21 mg Transdermal Daily  . pantoprazole  40 mg Oral QAC breakfast  . predniSONE  50 mg Oral Q breakfast  . ramelteon  8 mg Oral QHS  . sodium chloride flush  3 mL Intravenous Q12H   Continuous Infusions: . sodium chloride     PRN Meds: sodium chloride, acetaminophen **OR** acetaminophen, ondansetron **OR** ondansetron (ZOFRAN) IV, sodium chloride flush   Vital Signs    Vitals:   11/15/16 1734 11/15/16 1918 11/16/16 0318 11/16/16 0735  BP: (!) 133/59 111/60 (!) 120/59   Pulse: (!) 107 (!) 101 88   Resp: 16 18 18    Temp: 97.7 F (36.5 C) 97.9 F (36.6 C) 97.7 F (36.5 C)   TempSrc: Oral Oral Oral   SpO2: 98% 98% 96% 96%  Weight:      Height:        Intake/Output Summary (Last 24 hours) at 11/16/16 1128 Last data filed at 11/16/16 0958  Gross per 24 hour  Intake              240 ml  Output                0 ml  Net              240 ml   Filed Weights   11/12/16 1440 11/14/16 1150  Weight: 174 lb (78.9 kg) 171 lb 15.3 oz (78 kg)    Physical Exam   GEN: Well nourished, well developed, in no acute distress.  HEENT: Grossly normal.  Neck: Supple, no JVD, carotid bruits, or masses. Cardiac: IR, IR, no murmurs, rubs, or gallops. No clubbing, cyanosis, edema.  Radials/DP/PT 2+ and equal bilaterally.  Respiratory:  Respirations regular and unlabored, scattered rhonchi with bibasilar crackles and exp  wheezing. GI: Soft, nontender, nondistended, BS + x 4. MS: no deformity or atrophy. Skin: warm and dry, no rash. Neuro:  Strength and sensation are intact. Psych: AAOx3.  Normal affect.  Labs    Chemistry Recent Labs Lab 11/12/16 1439 11/13/16 0523 11/14/16 0856 11/15/16 0551  NA 144 141  --  141  K 3.1* 3.3* 3.1* 4.7  CL 108 107  --  109  CO2 28 24  --  27  GLUCOSE 102* 199*  --  114*  BUN 11 15  --  24*  CREATININE 0.64 0.74  --  0.82  CALCIUM 8.8* 8.6*  --  9.0  PROT 6.1*  --   --   --   ALBUMIN 3.2*  --   --   --   AST 18  --   --   --   ALT 11*  --   --   --   ALKPHOS 74  --   --   --   BILITOT 0.6  --   --   --  GFRNONAA >60 >60  --  >60  GFRAA >60 >60  --  >60  ANIONGAP 8 10  --  5     Hematology Recent Labs Lab 11/12/16 1439 11/13/16 0523 11/15/16 0551  WBC 7.2 7.8 11.8*  RBC 4.38 4.20 4.40  HGB 12.2 11.7* 12.3  HCT 35.9 34.6* 36.4  MCV 82.0 82.3 82.7  MCH 27.9 27.9 28.1  MCHC 34.0 33.9 33.9  RDW 14.7* 14.6* 14.9*  PLT 178 189 204    Cardiac Enzymes Recent Labs Lab 11/12/16 1439  TROPONINI <0.03      BNP Recent Labs Lab 11/12/16 1439  BNP 88.0     Radiology    No results found.  Telemetry    Afib, 80's - Personally Reviewed  Cardiac Studies   2D Echocardiogram 8.28.2018  Study Conclusions   - Procedure narrative: Transthoracic echocardiography. The study   was technically difficult. - Left ventricle: The cavity size was normal. Wall thickness was   increased in a pattern of mild LVH. Systolic function was   vigorous. The estimated ejection fraction was in the range of 65%   to 70%. The study is not technically sufficient to allow   evaluation of LV diastolic function. - Right ventricle: Poorly visualized. Systolic function was grossly   normal.   Patient Profile     83 y.o. female with a h/o tobacco abuse, COPD, and IBS, admitted to [facility] w/ dyspnea and malignant pleural effusion s/p thoracentesis, who developed AF  w/ RVR on 8/28.  Assessment & Plan   1.  Afib RVR:  In setting of COPD and pleural effusion w/ resp distress.  She remains in Afib and is largely asymptomatic.  Echo showed nl EF.  Rates currently 80's to low 100's @ rest.  Amio started yesterday due to elevated rates w/ ambulation.  Cont amio as previously outlined - 400 bid x 5 days, then 200 bid.  Cont eliquis 5 bid.  I will titrate dilt to 180 mg daily.  BP is borderline, and thus she may not tolerate higher doses.  With wheezing and ongoing steroids for COPD, would continue to avoid  blockers.  If rates remain elevated with ambulation despite additional dilt, we may need to add low dose digoxin.  Plan outpt f/u with [facility] after three wks of [place] if she hasn't converted prior to that.  At that point, she would require at least 4 wks of uninterrupted [place].  Will need to keep this in mind in planning lung biopsies.  2.  Lung Cancer/L Pleural Effusion:  S/p thoracentesis  1.7L removed.  Seen by heme-onc w/ plan for PET scan and bx as outpt.    3.  Tob Abuse:  Complete cessation advised.  4.  COPD:  Still with wheezing and scattered rhonchi.  Inhalers/steroids per IM.  Signed, Murray Hodgkins, NP  11/16/2016, 11:28 AM

## 2016-11-16 NOTE — Progress Notes (Signed)
Discharge instructions explained to ptand pts family/ verbalized an understanding/ iv and tele removed/ RX given to pt/ will transport off unit via wheelchair.

## 2016-11-16 NOTE — Telephone Encounter (Signed)
No answer. Left message to call back.   

## 2016-11-16 NOTE — Discharge Summary (Signed)
[facility] NAME: [name]    MR#:  [id number]  DATE OF BIRTH:  November 08, 1932  DATE OF ADMISSION:  11/12/2016 ADMITTING PHYSICIAN: Dustin Flock, MD  DATE OF DISCHARGE: 11/16/2016   PRIMARY CARE PHYSICIAN: Albina Billet, MD    ADMISSION DIAGNOSIS:  SOB (shortness of breath) [R06.02] Pleural effusion [J90] Dyspnea, unspecified type [R06.00]  DISCHARGE DIAGNOSIS:  Active Problems:   SOB (shortness of breath)   Atrial fibrillation ([place])   SECONDARY DIAGNOSIS:   Past Medical History:  Diagnosis Date  . COPD (chronic obstructive pulmonary disease) ([place])   . GU (gastric peptic ulcer)     HOSPITAL COURSE:  JaniceWardis a 83 y.o.femalewith a known history of COPD and gastric ulcer presenting with chest pain and shortness of breath. She reports that her symptoms started since yesterday. She states that it feels like heavy pressure on the chest and is feeling short of breath. Patient also has had a dry cough.  1. Shortness of breath due to large left-sided pleural effusion s/p Thoracentesis with removal of 1.7 liters -Fluid cytology reporting no malignancy. -CT scan of the chest shows multiple  lung masses highly suspicious from lung cancer -seen by Dr Grayland Ormond and will get w/u as out pt ( PET scan and Biopsy of lung nodule) - on nebulizers and steroids due to smoking history and a questionable COPD  2. Hypokalemia Replace.  3.Aib with RVR---new onset - on po cardizem long acting. - with tachycardia again, oral amio added. - echo - EF 70% - As per cardio Cardizem increased to 180 mg daily and amio 5 days high dose then 200 mg BID.  4. History of IBS Ordered GI panel, but did not have BM in 2 days, so did not send.  5. Nicotine abuse smoking cessation provided 62minutes spent strongly recommended she stop smoking patient started on a nicotine patch   DISCHARGE CONDITIONS:   Stable.  CONSULTS  OBTAINED:  Treatment Team:  [name], MD End, Harrell Gave, MD  DRUG ALLERGIES:   Allergies  Allergen Reactions  . Sulfa Antibiotics     DISCHARGE MEDICATIONS:   Current Discharge Medication List    START taking these medications   Details  !! amiodarone (PACERONE) 200 MG tablet Take 1 tablet (200 mg total) by mouth 2 (two) times daily. Qty: 60 tablet, Refills: 0    !! amiodarone (PACERONE) 400 MG tablet Take 1 tablet (400 mg total) by mouth 2 (two) times daily. Qty: 6 tablet, Refills: 0    apixaban (ELIQUIS) 5 MG TABS tablet Take 1 tablet (5 mg total) by mouth 2 (two) times daily. Qty: 60 tablet, Refills: 0    diltiazem (CARDIZEM CD) 180 MG 24 hr capsule Take 1 capsule (180 mg total) by mouth daily. Qty: 30 capsule, Refills: 0    nicotine (NICODERM CQ - DOSED IN MG/24 HOURS) 21 mg/24hr patch Place 1 patch (21 mg total) onto the skin daily. Qty: 28 patch, Refills: 0    predniSONE (STERAPRED UNI-PAK 21 TAB) 10 MG (21) TBPK tablet Take 6 tabs first day, 5 tab on day 2, then 4 on day 3rd, 3 tabs on day 4th , 2 tab on day 5th, and 1 tab on 6th day. Qty: 21 tablet, Refills: 0     !! - Potential duplicate medications found. Please discuss with provider.    CONTINUE these medications which have NOT CHANGED   Details  cetirizine (ZYRTEC) 10 MG tablet  Take 10 mg by mouth daily as needed for allergies.    cholecalciferol (VITAMIN D) 1000 units tablet Take 1,000 Units by mouth daily.    Dexlansoprazole (DEXILANT PO) Take 1 capsule by mouth daily.    furosemide (LASIX) 20 MG tablet Take 1 tablet by mouth daily as needed. For EDEMA Refills: 3         DISCHARGE INSTRUCTIONS:    Follow in Cancer center and cardiology clinic as advised.  If you experience worsening of your admission symptoms, develop shortness of breath, life threatening emergency, suicidal or homicidal thoughts you must seek medical attention immediately by calling 911 or calling your MD  immediately  if symptoms less severe.  You Must read complete instructions/literature along with all the possible adverse reactions/side effects for all the Medicines you take and that have been prescribed to you. Take any new Medicines after you have completely understood and accept all the possible adverse reactions/side effects.   Please note  You were cared for by a hospitalist during your hospital stay. If you have any questions about your discharge medications or the care you received while you were in the hospital after you are discharged, you can call the unit and asked to speak with the hospitalist on call if the hospitalist that took care of you is not available. Once you are discharged, your primary care physician will handle any further medical issues. Please note that NO REFILLS for any discharge medications will be authorized once you are discharged, as it is imperative that you return to your primary care physician (or establish a relationship with a primary care physician if you do not have one) for your aftercare needs so that they can reassess your need for medications and monitor your lab values.    Today   CHIEF COMPLAINT:   Chief Complaint  Patient presents with  . Chest Pain    HISTORY OF PRESENT ILLNESS:  [name]  is a 83 y.o. female with a known history of COPD and gastric ulcer presenting with chest pain and shortness of breath. She reports that her symptoms started since yesterday. She states that it feels like heavy pressure on the chest and is feeling short of breath. Patient also has had a dry cough. Unable to bring up anything. Has not had any fevers or chills. Denies any nausea vomiting or diarrhea. No weight loss or weight gain.   VITAL SIGNS:  Blood pressure 118/62, pulse (!) 114, temperature 98.2 F (36.8 C), temperature source Oral, resp. rate 18, height 5\' 3"  (1.6 m), weight 78 kg (171 lb 15.3 oz), SpO2 93 %.  I/O:   Intake/Output Summary (Last 24  hours) at 11/16/16 1433 Last data filed at 11/16/16 1342  Gross per 24 hour  Intake              480 ml  Output                0 ml  Net              480 ml    PHYSICAL EXAMINATION:  GENERAL:  83 y.o.-year-old patient lying in the bed with no acute distress.  EYES: Pupils equal, round, reactive to light and accommodation. No scleral icterus. Extraocular muscles intact.  HEENT: Head atraumatic, normocephalic. Oropharynx and nasopharynx clear.  NECK:  Supple, no jugular venous distention. No thyroid enlargement, no tenderness.  LUNGS: Normal breath sounds bilaterally, no wheezing, rales,rhonchi or crepitation. No use of accessory muscles of  respiration.  CARDIOVASCULAR: S1, S2 normal. No murmurs, rubs, or gallops.  ABDOMEN: Soft, non-tender, non-distended. Bowel sounds present. No organomegaly or mass.  EXTREMITIES: No pedal edema, cyanosis, or clubbing.  NEUROLOGIC: Cranial nerves II through XII are intact. Muscle strength 5/5 in all extremities. Sensation intact. Gait not checked.  PSYCHIATRIC: The patient is alert and oriented x 3.  SKIN: No obvious rash, lesion, or ulcer.   DATA REVIEW:   CBC  Recent Labs Lab 11/15/16 0551  WBC 11.8*  HGB 12.3  HCT 36.4  PLT 204    Chemistries   Recent Labs Lab 11/12/16 1439  11/15/16 0551 11/16/16 0404  NA 144  < > 141  --   K 3.1*  < > 4.7  --   CL 108  < > 109  --   CO2 28  < > 27  --   GLUCOSE 102*  < > 114*  --   BUN 11  < > 24*  --   CREATININE 0.64  < > 0.82  --   CALCIUM 8.8*  < > 9.0  --   MG  --   < > 2.1 1.9  AST 18  --   --   --   ALT 11*  --   --   --   ALKPHOS 74  --   --   --   BILITOT 0.6  --   --   --   < > = values in this interval not displayed.  Cardiac Enzymes  Recent Labs Lab 11/12/16 1439  TROPONINI <0.03    Microbiology Results  Results for orders placed or performed during the hospital encounter of 11/12/16  MRSA PCR Screening     Status: None   Collection Time: 11/12/16  6:48 PM  Result  Value Ref Range Status   MRSA by PCR NEGATIVE NEGATIVE Final    Comment:        The GeneXpert MRSA Assay (FDA approved for NASAL specimens only), is one component of a comprehensive MRSA colonization surveillance program. It is not intended to diagnose MRSA infection nor to guide or monitor treatment for MRSA infections.   Body fluid culture     Status: None (Preliminary result)   Collection Time: 11/13/16  2:00 PM  Result Value Ref Range Status   Specimen Description PLEURAL  Final   Special Requests NONE  Final   Gram Stain   Final    ABUNDANT WBC PRESENT, PREDOMINANTLY MONONUCLEAR NO ORGANISMS SEEN    Culture   Final    NO GROWTH 3 DAYS Performed at [facility], [address], [place] [employer] [place] [employer], [place] [address]    Report Status PENDING  Incomplete  Acid Fast Smear (AFB)     Status: None   Collection Time: 11/13/16  2:00 PM  Result Value Ref Range Status   AFB Specimen Processing Concentration  Final   Acid Fast Smear Negative  Final    Comment: (NOTE) Performed At: [facility] [place], [place] [id number] Lindon Romp MD [id number]    Source (AFB) PLEURAL  Final    RADIOLOGY:  No results found.  EKG:   Orders placed or performed during the hospital encounter of 11/12/16  . ED EKG  . ED EKG  . EKG 12-Lead  . EKG 12-Lead  . EKG 12-Lead  . EKG 12-Lead      Management plans discussed with the patient, family and they are in agreement.  CODE STATUS:  Code Status Orders        Start     Ordered   11/12/16 1812  Full code  Continuous     11/12/16 1811    Code Status History    Date Active Date Inactive Code Status Order ID Comments User Context   This patient has a current code status but no historical code status.      TOTAL TIME TAKING CARE OF THIS PATIENT: 35 minutes.    [name] M.D on 11/16/2016 at 2:33 PM  Between 7am to 6pm - Pager - [phone]  After 6pm go to [email or web address] - password  [facility]  Office  [phone]  CC: Primary care physician; Albina Billet, MD   Note: This dictation was prepared with Dragon dictation along with smaller phrase technology. Any transcriptional errors that result from this process are unintentional.

## 2016-11-16 NOTE — Care Management Important Message (Signed)
Important Message  Patient Details  Name: [name] MRN: [id number] Date of Birth: September 05, 1932   Medicare Important Message Given:  Yes Signed IM notice given    Katrina Stack, RN 11/16/2016, 3:34 PM

## 2016-11-17 LAB — BODY FLUID CULTURE: Culture: NO GROWTH

## 2016-11-22 NOTE — Telephone Encounter (Signed)
Patient contacted regarding discharge from [facility] on 11/16/16.  Patient understands to follow up with provider Dr. Rockey Situ on 12/15/16 at 07:40AM at [facility]. Patient understands discharge instructions? Yes Patient understands medications and regiment? Yes Patient understands to bring all medications to this visit? Yes  Patient requested that I review all information with her daughter due to hard of hearing. Reviewed appointment information and they verbalized understanding.

## 2016-11-23 ENCOUNTER — Ambulatory Visit: Payer: Medicare HMO

## 2016-11-24 ENCOUNTER — Inpatient Hospital Stay: Payer: Medicare HMO | Admitting: Oncology

## 2016-11-27 ENCOUNTER — Ambulatory Visit: Admit: 2016-11-27 | Payer: Medicare HMO

## 2016-11-29 ENCOUNTER — Encounter
Admission: RE | Admit: 2016-11-29 | Discharge: 2016-11-29 | Disposition: A | Discharge status: Home / Self Care | Payer: Medicare HMO | Source: Ambulatory Visit | Attending: Oncology | Admitting: Oncology

## 2016-11-29 DIAGNOSIS — R918 Other nonspecific abnormal finding of lung field: Secondary | ICD-10-CM | POA: Diagnosis present

## 2016-11-29 LAB — GLUCOSE, CAPILLARY: Glucose-Capillary: 104 mg/dL — ABNORMAL HIGH (ref 65–99)

## 2016-11-29 MED ORDER — FLUDEOXYGLUCOSE F - 18 (FDG) INJECTION
12.2900 | Freq: Once | INTRAVENOUS | Status: AC | PRN
  Administered 2016-11-29: 12.29 via INTRAVENOUS

## 2016-11-30 ENCOUNTER — Ambulatory Visit (INDEPENDENT_AMBULATORY_CARE_PROVIDER_SITE_OTHER): Payer: Medicare HMO | Admitting: Internal Medicine

## 2016-11-30 ENCOUNTER — Encounter: Payer: Self-pay | Admitting: Internal Medicine

## 2016-11-30 VITALS — BP 94/58 | HR 60 | Resp 16 | Ht 63.0 in | Wt 171.0 lb

## 2016-11-30 DIAGNOSIS — R918 Other nonspecific abnormal finding of lung field: Secondary | ICD-10-CM

## 2016-11-30 NOTE — Progress Notes (Addendum)
Name: [name] MRN:   [id number] DOB:   [birth year]              CONSULTATION DATE: 11/30/16   REFERRING MD :  Dr. Posey Pronto  CHIEF COMPLAINT: shortness of breath and chest pain  BRIEF PATIENT DESCRIPTION: 83 year old female with new onset of Afib with RVR  SIGNIFICANT EVENTS  8/26 Patient  Was admitted with shortness of breath 8/28 Patient developed new onset for Afib RVR  STUDIES: 11/14/16 ECHO>> The cavity size was normal. Wall thickness wasincreased in a pattern of mild LVH. Systolic function wasvigorous. The estimated ejection fraction was in the range of 65% to 70%. The study is not technically sufficient to allow  HISTORY OF PRESENT ILLNESS:  [name] is an 83 year old female with COPD,IBS and gastric ulcer.  Patient presented initially on 8/26 with shortness of breath and chest tighghtness.  CT chest was concerning for multiple lung mass as well as large left sided pleural efusion concerning for malignancy.  She underwent thoracentesis and was taken 1.7 liter of fluid post procedure she developed afib and rapid ventricular response.  Patient was initially started on diltiazem infusion which was subsequently changed to po . Patient continues to be in afib with controlled rate and Heparin gtt. Patient improved and now needs to undergo further evaluation for these lung masses  I have reviewed the CT scan and the PET scan results of the patient today and dependently reviewed the left lung masses which may be amenable to CT-guided biopsy  I have explained this to the patient and she would like to undergo diagnostic procedure  PAST MEDICAL HISTORY :   has a past medical history of COPD (chronic obstructive pulmonary disease) ([place]) and GU (gastric peptic ulcer).  has a past surgical history that includes Cholecystectomy.        Prior to Admission medications   Medication Sig Start Date End Date Taking? Authorizing Provider  cetirizine (ZYRTEC) 10 MG tablet Take 10 mg  by mouth daily as needed for allergies.   Yes [provider]  cholecalciferol (VITAMIN D) 1000 units tablet Take 1,000 Units by mouth daily.   Yes [provider]  Dexlansoprazole (DEXILANT PO) Take 1 capsule by mouth daily.   Yes [provider]  furosemide (LASIX) 20 MG tablet Take 1 tablet by mouth daily as needed. For EDEMA 10/07/16  Yes [provider]       Allergies  Allergen Reactions  . Sulfa Antibiotics     FAMILY HISTORY:  family history includes Hypertension in her father. SOCIAL HISTORY:  reports that she has been smoking.  She has been smoking about 2.00 packs per day. She has never used smokeless tobacco. She reports that she does not drink alcohol or use drugs.  REVIEW OF SYSTEMS:   Constitutional: Negative for fever, chills, weight loss, malaise/fatigue and diaphoresis.  HENT: Negative for hearing loss, ear pain, nosebleeds, congestion, sore throat, neck pain, tinnitus and ear discharge.   Eyes: Negative for blurred vision, double vision, photophobia, pain, discharge and redness.  Respiratory: Negative for cough, hemoptysis, sputum production, shortness of breath, wheezing and stridor.   Cardiovascular: Negative for chest pain, palpitations, orthopnea, claudication, leg swelling and PND.  Gastrointestinal: Negative for heartburn, nausea, vomiting, abdominal pain, diarrhea, constipation, blood in stool and melena.  Genitourinary: Negative for dysuria, urgency, frequency, hematuria and flank pain.  Musculoskeletal: left chest wall pain Skin: Negative for itching and rash.  Neurological: Negative for dizziness, tingling,  tremors, sensory change, speech change, focal weakness, seizures, loss of consciousness, weakness and headaches.  Endo/Heme/Allergies: Negative for environmental allergies and polydipsia. Does not bruise/bleed easily.  SUBJECTIVE: Patient states that she has been feeling fine  VITAL SIGNS: BP (!) 94/58    Pulse 60   Resp 16   Ht 5\' 3"  (1.6 m)   Wt 171 lb (77.6 kg)   SpO2 96%   BMI 30.29 kg/m    Physical Examination:   GENERAL:NAD, no fevers, chills, no weakness no fatigue HEAD: Normocephalic, atraumatic.  EYES: Pupils equal, round, reactive to light. Extraocular muscles intact. No scleral icterus.  MOUTH: Moist mucosal membrane. Dentition intact. No abscess noted.  EAR, NOSE, THROAT: Clear without exudates. No external lesions.  NECK: Supple. No thyromegaly. No nodules. No JVD.  PULMONARY: Diffuse coarse rhonchi right sided +wheezes CARDIOVASCULAR: S1 and S2. Regular rate and rhythm. No murmurs, rubs, or gallops. No edema. Pedal pulses 2+ bilaterally.  GASTROINTESTINAL: Soft, nontender, nondistended. No masses. Positive bowel sounds. No hepatosplenomegaly.  MUSCULOSKELETAL: +tenderness to left chest wall pain.  NEUROLOGIC: Cranial nerves II through XII are intact. No gross focal neurological deficits. Sensation intact. Reflexes intact.  SKIN: No ulceration, lesions, rashes, or cyanosis. Skin warm and dry. Turgor intact.  PSYCHIATRIC: Mood, affect within normal limits. The patient is awake, alert and oriented x 3. Insight, judgment intact.  ALL OTHER ROS ARE NEGATIVE     ASSESSMENT AND PLAN 83 year old pleasant white female admitted recently for acute shortness of breath complicated by acute A. fib with RVR found to have multiple lung masses on CAT scan findings consistent with probable underlying lung cancer after further evaluation and assessment I've explained this findings to the patient and the family and at this time will need further diagnostic evaluation with CT-guided biopsy as the safest approach to obtain a diagnostic sample  Plan for CT-guided biopsy via interventional radiology Patient also will need to be given instructions for how to handle her Eliquis therapy for her anticoagulation prior to procedure    Patient/Family are satisfied with Plan of action and  management. All questions answered  Corrin Parker, M.D.  [facility]  Medical Director [place] Director [facility] Cardio-Pulmonary Department

## 2016-11-30 NOTE — Patient Instructions (Signed)
Plan for CT guided biopsy next week

## 2016-12-01 ENCOUNTER — Encounter: Payer: Self-pay | Admitting: *Deleted

## 2016-12-01 NOTE — Progress Notes (Signed)
  Oncology Nurse Navigator Documentation  Navigator Location: [facility] (12/01/16 0900)   )Navigator Encounter Type: Introductory phone call (12/01/16 0900)   Abnormal Finding Date: 11/12/16 (12/01/16 0900)                     Barriers/Navigation Needs: Coordination of Care (12/01/16 0900)   Interventions: Coordination of Care (12/01/16 0900)   Coordination of Care: Appts;Radiology (12/01/16 0900)        Acuity: Level 2 (12/01/16 0900)   Acuity Level 2: Initial guidance, education and coordination as needed;Educational needs;Assistance expediting appointments (12/01/16 0900)  phone call made to patient to introduce to navigator services. Pt informed of appts scheduled for biopsy on 9/19 and follow up with Dr. Grayland Ormond on 9/26. Reviewed instructions for biopsy. Pt states that she was told to hold eliquis 2 days prior to biopsy. Contact info given to patient and instructed to call with any further questions or needs. Pt verbalized understanding and confirmed appts.    Time Spent with Patient: 30 (12/01/16 0900)

## 2016-12-04 ENCOUNTER — Ambulatory Visit: Payer: Medicare HMO | Admitting: Oncology

## 2016-12-05 ENCOUNTER — Other Ambulatory Visit: Payer: Self-pay | Admitting: Physician Assistant

## 2016-12-06 ENCOUNTER — Ambulatory Visit
Admission: RE | Admit: 2016-12-06 | Discharge: 2016-12-06 | Disposition: A | Discharge status: Home / Self Care | Payer: Medicare HMO | Source: Ambulatory Visit | Attending: Oncology | Admitting: Oncology

## 2016-12-06 DIAGNOSIS — Z8711 Personal history of peptic ulcer disease: Secondary | ICD-10-CM | POA: Insufficient documentation

## 2016-12-06 DIAGNOSIS — I2510 Atherosclerotic heart disease of native coronary artery without angina pectoris: Secondary | ICD-10-CM | POA: Diagnosis not present

## 2016-12-06 DIAGNOSIS — J449 Chronic obstructive pulmonary disease, unspecified: Secondary | ICD-10-CM | POA: Insufficient documentation

## 2016-12-06 DIAGNOSIS — Z7901 Long term (current) use of anticoagulants: Secondary | ICD-10-CM | POA: Diagnosis not present

## 2016-12-06 DIAGNOSIS — R918 Other nonspecific abnormal finding of lung field: Secondary | ICD-10-CM | POA: Insufficient documentation

## 2016-12-06 DIAGNOSIS — N281 Cyst of kidney, acquired: Secondary | ICD-10-CM | POA: Diagnosis not present

## 2016-12-06 DIAGNOSIS — E279 Disorder of adrenal gland, unspecified: Secondary | ICD-10-CM | POA: Diagnosis not present

## 2016-12-06 DIAGNOSIS — Z87891 Personal history of nicotine dependence: Secondary | ICD-10-CM | POA: Insufficient documentation

## 2016-12-06 DIAGNOSIS — R222 Localized swelling, mass and lump, trunk: Secondary | ICD-10-CM | POA: Insufficient documentation

## 2016-12-06 DIAGNOSIS — J90 Pleural effusion, not elsewhere classified: Secondary | ICD-10-CM | POA: Diagnosis not present

## 2016-12-06 DIAGNOSIS — I700 Atherosclerosis of aorta: Secondary | ICD-10-CM | POA: Diagnosis not present

## 2016-12-06 DIAGNOSIS — D3501 Benign neoplasm of right adrenal gland: Secondary | ICD-10-CM | POA: Insufficient documentation

## 2016-12-06 LAB — CBC
HCT: 35.8 % (ref 35.0–47.0)
HEMOGLOBIN: 12.5 g/dL (ref 12.0–16.0)
MCH: 28.8 pg (ref 26.0–34.0)
MCHC: 34.8 g/dL (ref 32.0–36.0)
MCV: 82.6 fL (ref 80.0–100.0)
PLATELETS: 210 10*3/uL (ref 150–440)
RBC: 4.34 MIL/uL (ref 3.80–5.20)
RDW: 15.4 % — AB (ref 11.5–14.5)
WBC: 7.2 10*3/uL (ref 3.6–11.0)

## 2016-12-06 LAB — PROTIME-INR
INR: 0.93
PROTHROMBIN TIME: 12.4 s (ref 11.4–15.2)

## 2016-12-06 LAB — APTT: aPTT: 28 seconds (ref 24–36)

## 2016-12-06 MED ORDER — FENTANYL CITRATE (PF) 100 MCG/2ML IJ SOLN
INTRAMUSCULAR | Status: AC | PRN
  Administered 2016-12-06: 12.5 ug via INTRAVENOUS
  Administered 2016-12-06: 25 ug via INTRAVENOUS

## 2016-12-06 MED ORDER — MIDAZOLAM HCL 5 MG/5ML IJ SOLN
INTRAMUSCULAR | Status: AC
  Filled 2016-12-06: qty 5

## 2016-12-06 MED ORDER — MIDAZOLAM HCL 5 MG/5ML IJ SOLN
INTRAMUSCULAR | Status: AC | PRN
  Administered 2016-12-06 (×2): 0.5 mg via INTRAVENOUS

## 2016-12-06 MED ORDER — SODIUM CHLORIDE 0.9 % IV SOLN: INTRAVENOUS | Status: DC

## 2016-12-06 NOTE — Procedures (Signed)
Pre procedural Dx: Hypermetabolic left lateral chest wall mass  Post procedural Dx: Same  Technically successful CT guided biopsy of hypermetabolic left lateral chest wall mass.   EBL: None.   Complications: None immediate.   Ronny Bacon, MD Pager #: [phone]

## 2016-12-06 NOTE — Consult Note (Signed)
Chief Complaint: Chest Wall mass  Referring Physician(s): [name],Timothy J  Patient Status: [facility] - Out-pt  History of Present Illness: [name] is a 83 y.o. female with past history significant for COPD who was found to have multiple hypermetabolic pulmonary nodules as well as a hypermetabolic left lateral chest wall mass worrisome for metastatic disease. She presents today for CT-guided biopsy for tissue diagnostic purposes. The patient is accompanied by her daughter though serves as her own historian.  Patient reports left lateral chest wall pain associated with the hypermetabolic lesion. She is otherwise without complaint. No chest pain or shortness of breath. No fever or chills.  Past Medical History:  Diagnosis Date  . COPD (chronic obstructive pulmonary disease) ([place])   . GU (gastric peptic ulcer)     Past Surgical History:  Procedure Laterality Date  . ABDOMINAL HYSTERECTOMY    . CHOLECYSTECTOMY      Allergies: Sulfa antibiotics  Medications: Prior to Admission medications   Medication Sig Start Date End Date Taking? Authorizing Provider  acetaminophen (TYLENOL) 325 MG tablet Take 650 mg by mouth once.   Yes [provider]  amiodarone (PACERONE) 200 MG tablet Take 1 tablet (200 mg total) by mouth 2 (two) times daily. 11/20/16 12/20/16 Yes [name], MD  cetirizine (ZYRTEC) 10 MG tablet Take 10 mg by mouth daily as needed for allergies.   Yes [provider]  cholecalciferol (VITAMIN D) 1000 units tablet Take 1,000 Units by mouth daily.   Yes [provider]  Dexlansoprazole (DEXILANT PO) Take 1 capsule by mouth daily.   Yes [provider]  diltiazem (CARDIZEM CD) 180 MG 24 hr capsule Take 1 capsule (180 mg total) by mouth daily. 11/17/16  Yes [name], MD  furosemide (LASIX) 20 MG tablet Take 1 tablet by mouth daily as needed. For EDEMA 10/07/16  Yes [provider]  nicotine (NICODERM CQ -  DOSED IN MG/24 HOURS) 21 mg/24hr patch Place 1 patch (21 mg total) onto the skin daily. 11/17/16  Yes [name], MD  pantoprazole (PROTONIX) 40 MG tablet Take 40 mg by mouth daily. 11/22/16  Yes [provider]  apixaban (ELIQUIS) 5 MG TABS tablet Take 1 tablet (5 mg total) by mouth 2 (two) times daily. 11/16/16   [name], MD     Family History  Problem Relation Age of Onset  . Hypertension Father     Social History   Social History  . Marital status: Widowed    Spouse name: N/A  . Number of children: N/A  . Years of education: N/A   Social History Main Topics  . Smoking status: Former Smoker    Packs/day: 2.00    Quit date: 11/06/2016  . Smokeless tobacco: Never Used  . Alcohol use No  . Drug use: No  . Sexual activity: No   Other Topics Concern  . None   Social History Narrative  . None    ECOG Status: 1 - Symptomatic but completely ambulatory  Review of Systems: A 12 point ROS discussed and pertinent positives are indicated in the HPI above.  All other systems are negative.  Review of Systems  Constitutional: Negative for activity change, appetite change, fatigue, fever and unexpected weight change.  Respiratory: Negative.   Cardiovascular: Negative.   Gastrointestinal: Negative.   Musculoskeletal:       Left lateral chest wall pain.    Vital Signs: BP 129/81   Pulse 70   Temp 98.2 F (36.8 C) (  Oral)   Resp 18   SpO2 95%   Physical Exam  Constitutional: She appears well-developed and well-nourished.  HENT:  Head: Normocephalic and atraumatic.  Cardiovascular: Normal rate and regular rhythm.   Pulmonary/Chest: Effort normal and breath sounds normal.  Psychiatric: She has a normal mood and affect. Her behavior is normal.  Nursing note and vitals reviewed.   Imaging: Ct Chest W Contrast  Result Date: 11/13/2016 CLINICAL DATA:  Known history of COPD and gastric ulcer presenting with chest pain and shortness of breath.  Symptoms started yesterday. Heavy pressure on chest and shortness of breath. Dry cough. Follow-up thoracentesis. EXAM: CT CHEST WITH CONTRAST TECHNIQUE: Multidetector CT imaging of the chest was performed during intravenous contrast administration. CONTRAST:  52mL ISOVUE-300 IOPAMIDOL (ISOVUE-300) INJECTION 61% COMPARISON:  Ultrasound thoracentesis earlier today. Chest x-ray dated 11/12/2016. Chest CT dated 07/24/2008. FINDINGS: Cardiovascular: Aortic atherosclerosis. No aortic aneurysm. Heart size is within normal limits. No pericardial effusion. Coronary artery calcifications noted. Mediastinum/Nodes: No enlarged or morphologically abnormal lymph nodes are identified within the mediastinum or perihilar regions. Esophagus appears normal. Trachea and central bronchi are unremarkable. Lungs/Pleura: Peripheral cavitary mass/consolidation within the lateral aspects of the left lower lobe, measuring 3.9 x 2.4 x 2.7 cm (series 3, image 104), with some surrounding atelectasis. Additional irregular masslike consolidation within the posterior-medial aspects of the left lower lobe, measuring 2.4 x 1.5 cm, (axial series 3, image 82 ; more suspicious appearance on sagittal reconstruction series 6, image 107). This could represent neoplastic mass or conglomerate atelectasis. Lastly, masslike consolidation within the left infrahilar lower lobe, measuring 3.7 x 1.8 x 3.3 cm (series 3, image 87; also more suspicious appearance on sagittal reconstruction series 6, image 114). This also could represent additional neoplastic process or conglomerate atelectasis. The left-sided pleural effusion seen on earlier chest x-ray has been removed. Right lung is clear.  No pneumothorax. Upper Abdomen: Bilateral adrenal masses, measuring 2.8 x 1.9 cm on left and 1.8 x 1.3 cm on the right. Probable cirrhosis of the liver, incompletely imaged. Musculoskeletal: No acute or suspicious osseous finding. Mild degenerative spurring within the thoracic  spine. IMPRESSION: 1. Cavitary mass/consolidation within the lateral aspects of the left lower lobe, measuring 3.9 cm, highly suspicious for neoplastic mass, less likely cavitary pneumonia. Recommend tissue sampling. This appears amenable to percutaneous biopsy. 2. Additional irregular masslike consolidation within the posterior-medial aspects of the left lower lobe, measuring 2.4 cm, most suspicious appearance on sagittal reconstructions (image numbers provided above), neoplastic mass versus conglomerate atelectasis. FDG PET-CT may be helpful for differentiation if needed. 3. Additional dense masslike consolidation within the central aspects of the left lower lobe, infrahilar, measuring 3.7 cm, also most suspicious appearance on sagittal reconstructions (image numbers provided above), also compatible with neoplastic mass versus conglomerate atelectasis. Again, FDG PET-CT may be helpful for differentiation. 4. No residual pleural effusion status post today's thoracentesis. 5. Bilateral adrenal masses, suspicious for adrenal metastases. 6. No evidence of mediastinal lymphadenopathy. 7. Aortic atherosclerosis. These results were called by telephone at the time of interpretation on 11/13/2016 at 3:25 pm to Dr. Fritzi Mandes , who verbally acknowledged these results. Electronically Signed   By: Franki Cabot M.D.   On: 11/13/2016 15:29   Nm Pet Image Initial (pi) Skull Base To Thigh  Result Date: 11/29/2016 CLINICAL DATA:  Initial treatment strategy for left lung mass. EXAM: NUCLEAR MEDICINE PET SKULL BASE TO THIGH TECHNIQUE: 12.3 mCi F-18 FDG was injected intravenously. Full-ring PET imaging was performed from the skull base  to thigh after the radiotracer. CT data was obtained and used for attenuation correction and anatomic localization. FASTING BLOOD GLUCOSE:  Value: 104 mg/dl COMPARISON:  CT chest 11/13/2016. FINDINGS: NECK: No hypermetabolic lymph nodes in the neck. CHEST: Multiple hypermetabolic parenchymal and  subpleural nodules are identified in the left lung. 2.6 cm left posterolateral nodule, behind the aorta demonstrates SUV max = 7.9. Hypermetabolism in the left hilum demonstrates SUV max = 10.4. 2 cm soft tissue nodule between the left inferior pulmonary vein and aorta demonstrates SUV max = 15.5. Left lower lobe cavitary lesion seen on the prior study is in a different location due to pleural fluid and atelectasis with SUV max = 9.7. 3.6 cm posterior left chest wall lesion with associated destruction of the posterior tenth rib demonstrates SUV max = 12.3. An area of subpleural airspace disease in the inferior right lung base (image 120 series 4) is hypermetabolic with SUV max = 5.5. ABDOMEN/PELVIS: No abnormal hypermetabolic activity within the liver, pancreas, adrenal glands, or spleen. No hypermetabolic lymph nodes in the abdomen or pelvis. Tiny low-density lesion lateral segment left liver on CT images is stable since prior CT scan and likely a cyst. Small bilateral adrenal nodules have average attenuation of less than 10 Hounsfield units and show no hypermetabolism on PET imaging, features most consistent with benign adrenal adenomas. There is abdominal aortic atherosclerosis without aneurysm. Stable appearance left renal cyst since abdomen and pelvis CT of 09/21/2009. SKELETON: No focal hypermetabolic activity to suggest skeletal metastasis. IMPRESSION: 1. Multiple hypermetabolic lung and pleural-based lesions in the left hemithorax. There is hypermetabolic probable lymph node adjacent to the lower thoracic aorta and a hypermetabolic left chest wall at the level of the tenth rib. 2. Subtle area of subpleural airspace opacity in the right lung base is hypermetabolic. This may be infectious/inflammatory although metastatic disease not excluded. 3. No definite metastatic disease in the abdomen or pelvis. 4. Bilateral adrenal adenomas. 5.  Aortic Atherosclerois (ICD10-170.0) Electronically Signed   By: Misty Stanley M.D.   On: 11/29/2016 15:30   Dg Chest Portable 1 View  Result Date: 11/12/2016 CLINICAL DATA:  Shortness of Breath EXAM: PORTABLE CHEST 1 VIEW COMPARISON:  09/26/2013 FINDINGS: Cardiac shadow is enlarged. Large left-sided pleural effusion is noted. There is likely underlying atelectasis present. The right lung is clear. No bony abnormality is noted. IMPRESSION: Large left pleural effusion. Electronically Signed   By: Inez Catalina M.D.   On: 11/12/2016 15:04   US Thoracentesis Asp Pleural Space W/img Guide  Result Date: 11/13/2016 CLINICAL DATA:  Large left pleural effusion. EXAM: ULTRASOUND GUIDED LEFT THORACENTESIS COMPARISON:  None. PROCEDURE: An ultrasound guided thoracentesis was thoroughly discussed with the patient and questions answered. The benefits, risks, alternatives and complications were also discussed. The patient understands and wishes to proceed with the procedure. Written consent was obtained. A time-out was performed prior to the procedure. Ultrasound was performed to localize and mark an adequate pocket of fluid in the left chest. The area was then prepped and draped in the normal sterile fashion. 1% Lidocaine was used for local anesthesia. Under ultrasound guidance a 6 French Safe-T-Centesis catheter was introduced. Thoracentesis was performed. The catheter was removed and a dressing applied. COMPLICATIONS: None FINDINGS: A total of approximately 1.7 L of yellowish colored fluid was removed. A fluid sample was sent for laboratory analysis. IMPRESSION: Successful ultrasound guided left thoracentesis yielding 1.7 L of pleural fluid. Electronically Signed   By: Aletta Edouard M.D.   On:  11/13/2016 15:07    Labs:  CBC:  Recent Labs  11/12/16 1439 11/13/16 0523 11/15/16 0551 12/06/16 1132  WBC 7.2 7.8 11.8* 7.2  HGB 12.2 11.7* 12.3 12.5  HCT 35.9 34.6* 36.4 35.8  PLT 178 189 204 210    COAGS:  Recent Labs  11/12/16 1448 12/06/16 1132  INR 1.00 0.93  APTT 28  28    BMP:  Recent Labs  11/12/16 1439 11/13/16 0523 11/14/16 0856 11/15/16 0551  NA 144 141  --  141  K 3.1* 3.3* 3.1* 4.7  CL 108 107  --  109  CO2 28 24  --  27  GLUCOSE 102* 199*  --  114*  BUN 11 15  --  24*  CALCIUM 8.8* 8.6*  --  9.0  CREATININE 0.64 0.74  --  0.82  GFRNONAA >60 >60  --  >60  GFRAA >60 >60  --  >60    LIVER FUNCTION TESTS:  Recent Labs  11/12/16 1439  BILITOT 0.6  AST 18  ALT 11*  ALKPHOS 74  PROT 6.1*  ALBUMIN 3.2*    TUMOR MARKERS: No results for input(s): AFPTM, CEA, CA199, CHROMGRNA in the last 8760 hours.  Assessment and Plan:  [name] is a 83 y.o. female with past history significant for COPD who was found to have multiple hypermetabolic pulmonary nodules as well as a hypermetabolic left lateral chest wall mass worrisome for metastatic disease. She presents today for CT-guided biopsy for tissue diagnostic purposes.   Patient reports left lateral chest wall pain associated with the hypermetabolic lesion but is otherwise without complaint.   Risks and benefits of CT guided left lateral chest wall biopsy were discussed with the patient including, but not limited to bleeding, hemoptysis, respiratory failure requiring intubation, infection, pneumothorax requiring chest tube placement, stroke from air embolism or even death.  All of the patient's questions were answered, patient is agreeable to proceed.  Consent signed and in chart.  Thank you for this interesting consult.  I greatly enjoyed meeting CARMYN HAMM and look forward to participating in their care.  A copy of this report was sent to the requesting provider on this date.  Electronically Signed: Sandi Mariscal, MD 12/06/2016, 12:15 PM   I spent a total of 15 Minutes in face to face in clinical consultation, greater than 50% of which was counseling/coordinating care for CT guided left chest wall mass biopsy.

## 2016-12-07 ENCOUNTER — Ambulatory Visit: Payer: Medicare HMO | Admitting: Oncology

## 2016-12-08 ENCOUNTER — Encounter: Payer: Self-pay | Admitting: Emergency Medicine

## 2016-12-08 ENCOUNTER — Telehealth: Payer: Self-pay | Admitting: *Deleted

## 2016-12-08 ENCOUNTER — Emergency Department: Payer: Medicare HMO

## 2016-12-08 ENCOUNTER — Emergency Department
Admission: EM | Admit: 2016-12-08 | Discharge: 2016-12-08 | Disposition: A | Discharge status: Home / Self Care | Payer: Medicare HMO | Attending: Emergency Medicine | Admitting: Emergency Medicine

## 2016-12-08 ENCOUNTER — Telehealth: Payer: Self-pay | Admitting: Internal Medicine

## 2016-12-08 DIAGNOSIS — J449 Chronic obstructive pulmonary disease, unspecified: Secondary | ICD-10-CM | POA: Diagnosis not present

## 2016-12-08 DIAGNOSIS — J90 Pleural effusion, not elsewhere classified: Secondary | ICD-10-CM | POA: Insufficient documentation

## 2016-12-08 DIAGNOSIS — Z87891 Personal history of nicotine dependence: Secondary | ICD-10-CM | POA: Diagnosis not present

## 2016-12-08 DIAGNOSIS — Z79899 Other long term (current) drug therapy: Secondary | ICD-10-CM | POA: Diagnosis not present

## 2016-12-08 DIAGNOSIS — R05 Cough: Secondary | ICD-10-CM | POA: Insufficient documentation

## 2016-12-08 DIAGNOSIS — R918 Other nonspecific abnormal finding of lung field: Secondary | ICD-10-CM | POA: Diagnosis not present

## 2016-12-08 DIAGNOSIS — R0602 Shortness of breath: Secondary | ICD-10-CM | POA: Diagnosis not present

## 2016-12-08 LAB — CBC WITH DIFFERENTIAL/PLATELET
BASOS ABS: 0.1 10*3/uL (ref 0–0.1)
Basophils Relative: 1 %
EOS PCT: 9 %
Eosinophils Absolute: 0.7 10*3/uL (ref 0–0.7)
HCT: 35.2 % (ref 35.0–47.0)
Hemoglobin: 11.8 g/dL — ABNORMAL LOW (ref 12.0–16.0)
LYMPHS PCT: 17 %
Lymphs Abs: 1.3 10*3/uL (ref 1.0–3.6)
MCH: 27.6 pg (ref 26.0–34.0)
MCHC: 33.6 g/dL (ref 32.0–36.0)
MCV: 82 fL (ref 80.0–100.0)
MONO ABS: 0.8 10*3/uL (ref 0.2–0.9)
Monocytes Relative: 10 %
Neutro Abs: 5 10*3/uL (ref 1.4–6.5)
Neutrophils Relative %: 63 %
PLATELETS: 212 10*3/uL (ref 150–440)
RBC: 4.29 MIL/uL (ref 3.80–5.20)
RDW: 16 % — AB (ref 11.5–14.5)
WBC: 7.8 10*3/uL (ref 3.6–11.0)

## 2016-12-08 LAB — BASIC METABOLIC PANEL
ANION GAP: 7 (ref 5–15)
BUN: 12 mg/dL (ref 6–20)
CALCIUM: 8.6 mg/dL — AB (ref 8.9–10.3)
CO2: 26 mmol/L (ref 22–32)
CREATININE: 0.8 mg/dL (ref 0.44–1.00)
Chloride: 109 mmol/L (ref 101–111)
GFR calc non Af Amer: >60 mL/min (ref >60)
GLUCOSE: 110 mg/dL — AB (ref 65–99)
POTASSIUM: 3.1 mmol/L — AB (ref 3.5–5.1)
SODIUM: 142 mmol/L (ref 135–145)

## 2016-12-08 MED ORDER — POTASSIUM CHLORIDE CRYS ER 20 MEQ PO TBCR
40.0000 meq | EXTENDED_RELEASE_TABLET | Freq: Once | ORAL | Status: AC
  Administered 2016-12-08: 40 meq via ORAL
  Filled 2016-12-08: qty 2

## 2016-12-08 MED ORDER — LEVOFLOXACIN 250 MG PO TABS: 250.0000 mg | ORAL_TABLET | Freq: Every day | ORAL | 0 refills | Status: AC

## 2016-12-08 NOTE — Discharge Instructions (Signed)
Please seek medical attention for any high fevers, chest pain, shortness of breath, change in behavior, persistent vomiting, bloody stool or any other new or concerning symptoms.  

## 2016-12-08 NOTE — Telephone Encounter (Signed)
Daughter called and states patient had lung biopsy yesterday and is very short of breath today. She is asking if she needs to be seen here or to go to ER. I called and spoke with Radiology who states the IR radiologist states she needs to go to the ER.  I returned call to Shirlean Mylar - daughter who will take her to ER

## 2016-12-08 NOTE — Telephone Encounter (Signed)
Acute visit scheduled with DS on 12/08/16

## 2016-12-08 NOTE — Telephone Encounter (Signed)
Dr. Dicie Beam called to give verbal report for path results from biopsy of left lower chest wall mass involving the 10th rib. Path results reveal squamous cell carcinoma. There is enough tissue for ancillary studies. Please advise if you want molecular studies to be performed.

## 2016-12-08 NOTE — Telephone Encounter (Signed)
Pt daughter states pt is leaving the ED and has pleural effusion again. Please call

## 2016-12-08 NOTE — Telephone Encounter (Signed)
Yes. PDL-1.  Thank you.

## 2016-12-08 NOTE — ED Provider Notes (Signed)
[facility] Emergency Department Provider Note   ____________________________________________   I have reviewed the triage vital signs and the nursing notes.   HISTORY  Chief Complaint Shortness of Breath   History limited by: Not Limited   HPI [name] is a 83 y.o. female who presents to the emergency department today because of concerns for shortness of breath. The patient states that the shortness of breath really became bad today. She notices that she was walking upstairs and then tried to talk to her family on the phone. The patient did not have any associated chest pain. She has had some cough recently. Of note the patient is being worked up for a mass in her lung and underwent a lung biopsy 2 days ago. Additionally she has required thoracentesis and had 1.5 L taken out of her lunglast month. Patient denies any fevers.   Past Medical History:  Diagnosis Date  . COPD (chronic obstructive pulmonary disease) ([place])   . GU (gastric peptic ulcer)     Patient Active Problem List   Diagnosis Date Noted  . Atrial fibrillation ([place])   . SOB (shortness of breath) 11/12/2016  . Non-pressure chronic ulcer of right thigh with fat layer exposed ([place]) 03/16/2015    Past Surgical History:  Procedure Laterality Date  . ABDOMINAL HYSTERECTOMY    . CHOLECYSTECTOMY      Prior to Admission medications   Medication Sig Start Date End Date Taking? Authorizing Provider  acetaminophen (TYLENOL) 325 MG tablet Take 650 mg by mouth once.    [provider]  amiodarone (PACERONE) 200 MG tablet Take 1 tablet (200 mg total) by mouth 2 (two) times daily. 11/20/16 12/20/16  [name], MD  apixaban (ELIQUIS) 5 MG TABS tablet Take 1 tablet (5 mg total) by mouth 2 (two) times daily. 11/16/16   [name], MD  cetirizine (ZYRTEC) 10 MG tablet Take 10 mg by mouth daily as needed for allergies.    [provider]  cholecalciferol (VITAMIN  D) 1000 units tablet Take 1,000 Units by mouth daily.    [provider]  Dexlansoprazole (DEXILANT PO) Take 1 capsule by mouth daily.    [provider]  diltiazem (CARDIZEM CD) 180 MG 24 hr capsule Take 1 capsule (180 mg total) by mouth daily. 11/17/16   [name], MD  furosemide (LASIX) 20 MG tablet Take 1 tablet by mouth daily as needed. For EDEMA 10/07/16   [provider]  nicotine (NICODERM CQ - DOSED IN MG/24 HOURS) 21 mg/24hr patch Place 1 patch (21 mg total) onto the skin daily. 11/17/16   [name], MD  pantoprazole (PROTONIX) 40 MG tablet Take 40 mg by mouth daily. 11/22/16   [provider]    Allergies Sulfa antibiotics  Family History  Problem Relation Age of Onset  . Hypertension Father     Social History Social History  Substance Use Topics  . Smoking status: Former Smoker    Packs/day: 2.00    Quit date: 11/06/2016  . Smokeless tobacco: Never Used  . Alcohol use No    Review of Systems Constitutional: No fever/chills Eyes: No visual changes. ENT: No sore throat. Cardiovascular: Denies chest pain. Respiratory: positive for shortness of breath Gastrointestinal: No abdominal pain.  No nausea, no vomiting.  No diarrhea.   Genitourinary: Negative for dysuria. Musculoskeletal: Negative for back pain. Skin: Negative for rash. Neurological: Negative for headaches, focal weakness or numbness.  ____________________________________________   PHYSICAL EXAM:  VITAL SIGNS:  ED Triage Vitals  Enc Vitals Group     BP 12/08/16 1300 (!) 114/58     Pulse Rate 12/08/16 1300 76     Resp 12/08/16 1253 20     Temp 12/08/16 1253 98.4 F (36.9 C)     Temp Source 12/08/16 1253 Oral     SpO2 12/08/16 1300 94 %     Weight 12/08/16 1254 171 lb (77.6 kg)     Height 12/08/16 1254 5\' 3"  (1.6 m)     Head Circumference --      Peak Flow --    Constitutional: Alert and oriented. Well appearing and in no distress. Eyes:  Conjunctivae are normal.  ENT   Head: Normocephalic and atraumatic.   Nose: No congestion/rhinnorhea.   Mouth/Throat: Mucous membranes are moist.   Neck: No stridor. Hematological/Lymphatic/Immunilogical: No cervical lymphadenopathy. Cardiovascular: Normal rate, regular rhythm.  No murmurs, rubs, or gallops.  Respiratory: Normal respiratory effort without tachypnea nor retractions. diminished breath sounds to the left lower and midlung. Gastrointestinal: Soft and non tender. No rebound. No guarding.  Genitourinary: Deferred Musculoskeletal: Normal range of motion in all extremities. No lower extremity edema. Neurologic:  Normal speech and language. No gross focal neurologic deficits are appreciated.  Skin:  Skin is warm, dry and intact. No rash noted. Psychiatric: Mood and affect are normal. Speech and behavior are normal. Patient exhibits appropriate insight and judgment.  ____________________________________________    LABS (pertinent positives/negatives)  CBC WBC: 7.8 Hgb: 11.8 Plt: 212 BMP K 3.1 Glu 110   ____________________________________________   EKG  I, Nance Pear, attending physician, personally viewed and interpreted this EKG  EKG Time: 1258 Rate: 77 Rhythm: normal sinus rhythm Axis: normal Intervals: qtc 467 QRS: low voltage ST changes: no st elevation Impression: abnormal ekg  ____________________________________________    RADIOLOGY  CXR Large left pleural effusion  I, Malikiah Debarr, personally viewed and evaluated these images (plain radiographs) as part of my medical decision making. ____________________________________________   PROCEDURES  Procedures  ____________________________________________   INITIAL IMPRESSION / ASSESSMENT AND PLAN / ED COURSE  Pertinent labs & imaging results that were available during my care of the patient were reviewed by me and considered in my medical decision making (see chart for  details).  patient presented to the emergency department today because of concerns for shortness of breath. Chest x-ray shows a large pleural effusion. She has had this tapped in the past. Patient not requiring any oxygen here. Patient would like to follow up for this as an outpatient. Was a question of possible pneumonia on the x-ray. Patient without any leukocytosis or fever. Discussed with patient that I will provide antibiotic prescription however she does not need to fill this unless she develops other infectious symptoms.  ____________________________________________   FINAL CLINICAL IMPRESSION(S) / ED DIAGNOSES  Final diagnoses:  Shortness of breath  Pleural effusion     Note: This dictation was prepared with Dragon dictation. Any transcriptional errors that result from this process are unintentional     Nance Pear, MD 12/08/16 1538

## 2016-12-08 NOTE — Telephone Encounter (Signed)
Agreed -

## 2016-12-08 NOTE — ED Triage Notes (Signed)
Patient presents to ED via POV from home with c/o SOB. Patient had a left lung biopsy done this past Wednesday. Patient has had a thoracentesis done on that affected lung 1 month ago. Family is concerned the fluid is building back up.

## 2016-12-11 ENCOUNTER — Ambulatory Visit (INDEPENDENT_AMBULATORY_CARE_PROVIDER_SITE_OTHER): Payer: Medicare HMO | Admitting: Pulmonary Disease

## 2016-12-11 ENCOUNTER — Encounter: Payer: Self-pay | Admitting: Cardiothoracic Surgery

## 2016-12-11 ENCOUNTER — Ambulatory Visit (INDEPENDENT_AMBULATORY_CARE_PROVIDER_SITE_OTHER): Payer: Medicare HMO | Admitting: Cardiothoracic Surgery

## 2016-12-11 ENCOUNTER — Encounter: Payer: Self-pay | Admitting: Pulmonary Disease

## 2016-12-11 ENCOUNTER — Telehealth: Payer: Self-pay

## 2016-12-11 ENCOUNTER — Encounter
Admission: RE | Admit: 2016-12-11 | Discharge: 2016-12-11 | Disposition: A | Discharge status: Home / Self Care | Payer: Medicare HMO | Source: Ambulatory Visit | Attending: Cardiothoracic Surgery | Admitting: Cardiothoracic Surgery

## 2016-12-11 VITALS — Resp 16 | Ht 63.0 in | Wt 170.0 lb

## 2016-12-11 VITALS — BP 123/77 | HR 80 | Temp 98.7°F | Resp 24 | Ht 63.0 in | Wt 170.6 lb

## 2016-12-11 DIAGNOSIS — Z7901 Long term (current) use of anticoagulants: Secondary | ICD-10-CM | POA: Diagnosis not present

## 2016-12-11 DIAGNOSIS — Z961 Presence of intraocular lens: Secondary | ICD-10-CM | POA: Diagnosis not present

## 2016-12-11 DIAGNOSIS — R0600 Dyspnea, unspecified: Secondary | ICD-10-CM

## 2016-12-11 DIAGNOSIS — Z01812 Encounter for preprocedural laboratory examination: Secondary | ICD-10-CM

## 2016-12-11 DIAGNOSIS — Z87891 Personal history of nicotine dependence: Secondary | ICD-10-CM | POA: Diagnosis not present

## 2016-12-11 DIAGNOSIS — C3492 Malignant neoplasm of unspecified part of left bronchus or lung: Secondary | ICD-10-CM | POA: Diagnosis not present

## 2016-12-11 DIAGNOSIS — C3490 Malignant neoplasm of unspecified part of unspecified bronchus or lung: Secondary | ICD-10-CM

## 2016-12-11 DIAGNOSIS — Z882 Allergy status to sulfonamides status: Secondary | ICD-10-CM | POA: Diagnosis not present

## 2016-12-11 DIAGNOSIS — J90 Pleural effusion, not elsewhere classified: Secondary | ICD-10-CM | POA: Diagnosis not present

## 2016-12-11 DIAGNOSIS — J910 Malignant pleural effusion: Secondary | ICD-10-CM | POA: Diagnosis not present

## 2016-12-11 DIAGNOSIS — L509 Urticaria, unspecified: Secondary | ICD-10-CM

## 2016-12-11 DIAGNOSIS — K279 Peptic ulcer, site unspecified, unspecified as acute or chronic, without hemorrhage or perforation: Secondary | ICD-10-CM | POA: Diagnosis not present

## 2016-12-11 DIAGNOSIS — I480 Paroxysmal atrial fibrillation: Secondary | ICD-10-CM | POA: Diagnosis not present

## 2016-12-11 DIAGNOSIS — I252 Old myocardial infarction: Secondary | ICD-10-CM | POA: Diagnosis not present

## 2016-12-11 DIAGNOSIS — Z79899 Other long term (current) drug therapy: Secondary | ICD-10-CM | POA: Diagnosis not present

## 2016-12-11 DIAGNOSIS — J449 Chronic obstructive pulmonary disease, unspecified: Secondary | ICD-10-CM | POA: Diagnosis not present

## 2016-12-11 HISTORY — DX: Pneumonia, unspecified organism: J18.9

## 2016-12-11 HISTORY — DX: Fracture of unspecified carpal bone, unspecified wrist, initial encounter for closed fracture: S62.109A

## 2016-12-11 LAB — CBC WITH DIFFERENTIAL/PLATELET
Basophils Absolute: 0.1 10*3/uL (ref 0–0.1)
Basophils Relative: 1 %
EOS PCT: 11 %
Eosinophils Absolute: 0.9 10*3/uL — ABNORMAL HIGH (ref 0–0.7)
HEMATOCRIT: 36.7 % (ref 35.0–47.0)
HEMOGLOBIN: 12.6 g/dL (ref 12.0–16.0)
LYMPHS ABS: 1.4 10*3/uL (ref 1.0–3.6)
LYMPHS PCT: 17 %
MCH: 28 pg (ref 26.0–34.0)
MCHC: 34.3 g/dL (ref 32.0–36.0)
MCV: 81.7 fL (ref 80.0–100.0)
Monocytes Absolute: 0.7 10*3/uL (ref 0.2–0.9)
Monocytes Relative: 8 %
NEUTROS ABS: 5.4 10*3/uL (ref 1.4–6.5)
NEUTROS PCT: 63 %
Platelets: 300 10*3/uL (ref 150–440)
RBC: 4.49 MIL/uL (ref 3.80–5.20)
RDW: 16 % — ABNORMAL HIGH (ref 11.5–14.5)
WBC: 8.5 10*3/uL (ref 3.6–11.0)

## 2016-12-11 LAB — PROTIME-INR
INR: 0.96
PROTHROMBIN TIME: 12.7 s (ref 11.4–15.2)

## 2016-12-11 LAB — APTT: aPTT: 32 seconds (ref 24–36)

## 2016-12-11 LAB — COMPREHENSIVE METABOLIC PANEL
ALK PHOS: 91 U/L (ref 38–126)
ALT: 9 U/L — AB (ref 14–54)
AST: 16 U/L (ref 15–41)
Albumin: 3.2 g/dL — ABNORMAL LOW (ref 3.5–5.0)
Anion gap: 13 (ref 5–15)
BILIRUBIN TOTAL: 0.6 mg/dL (ref 0.3–1.2)
BUN: 13 mg/dL (ref 6–20)
CALCIUM: 8.8 mg/dL — AB (ref 8.9–10.3)
CO2: 26 mmol/L (ref 22–32)
CREATININE: 0.86 mg/dL (ref 0.44–1.00)
Chloride: 97 mmol/L — ABNORMAL LOW (ref 101–111)
Glucose, Bld: 120 mg/dL — ABNORMAL HIGH (ref 65–99)
Potassium: 3 mmol/L — ABNORMAL LOW (ref 3.5–5.1)
Sodium: 136 mmol/L (ref 135–145)
Total Protein: 6.7 g/dL (ref 6.5–8.1)

## 2016-12-11 LAB — SURGICAL PCR SCREEN
MRSA, PCR: NEGATIVE
Staphylococcus aureus: NEGATIVE

## 2016-12-11 NOTE — Patient Instructions (Addendum)
Continue your current medications without change  You may use Benadryl as needed for hives  Referral to Dr. Genevive Bi (thoracic surgery) for placement of pleural catheter - he will see today after we are done here  Follow up in 3 months or sooner as needed

## 2016-12-11 NOTE — Progress Notes (Signed)
Pleurx Patient Education was completed with Video and Return Demonstration. All questions were answered. Education included drainage of pleurx catheter, wound care and dressing changes, troubleshooting pleurx system, home health and patient responsibilities, how to obtain supplies and whom to contact if there is difficulty with supply orders, when to contact provider, when to return to clinic, pleurx insertion surgery details.  Patient and family members were given Welcome Folder and were instructed to call supply company upon patient being discharged from hospital.  Supply order form faxed with positive confirmation.  Home Health: [facility]  Time spent with patient: 30  Minutes

## 2016-12-11 NOTE — Progress Notes (Signed)
ACUTE PULMONARY OFFICE VISIT  ACUTE PROBLEM: Recurrent moderate to large left pleural effusion with increased dyspnea Recent diagnosis of squamous cell carcinoma of the lung  Patient profile: Patient was recently hospitalized for new onset atrial fibrillation and acute respiratory distress and was found to have a moderate to large left pleural effusion. She underwent thoracentesis with improvement in her symptoms. The pleural fluid cytology was negative. A CT chest was performed which revealed multiple opacities worrisome for malignancy. PET scan was performed 11/29/16 which revealed multiple hypermetabolic lung and pleural-based lesions in the left hemithorax. CT-guided biopsy was performed 09/19 with finding of squamous cell carcinoma lung. She is scheduled to see Oncology on 12/13/16.  SUBJ: With the above history, she presented to the emergency department on 09/21 with increased dyspnea and was found to have a recurrence of the left pleural effusion. She was therefore referred to pulmonary medicine for further evaluation and management. She denies fevers chills and sweats. She has no pleuritic chest pain. She denies hemoptysis. Her dyspnea is present on mild-to-moderate exertion.  She also notes the onset of widespread urticaria on her torso since yesterday. She's had no recent changes in her medications. Notably, she already takes cetirizine  OBJ: Vitals:   12/11/16 0919  Resp: 16  Weight: 77.1 kg (170 lb)  Height: 5\' 3"  (1.6 m)  SPO2 97% on room air BP 118/60   Gen: NAD at rest HEENT: NCAT, sclerae white, oropharynx normal Neck: No LAN, no JVD noted Lungs: Dull to percussion with markedly diminished breath sounds approximately 1/2 up on left side, no wheezes Cardiovascular: Regular, no M noted Abdomen: Soft, NT, +BS Ext: no C/C/E Neuro: grossly intact  CXR 12/08/16: Recurrent moderate to large left pleural effusion   IMPRESSION: Smoker Recent diagnosis squamous cell  carcinoma lung Recurrent left pleural effusion, malignancy related, with significant dyspnea Urticaria of unclear etiology  PLAN:   I discussed in detail the management of what should be considered a malignant pleural effusion. It is generally not a satisfactory strategy to perform repeated thoracenteses. I've discussed with Dr. Genevive Bi of thoracic surgery who graciously agreed to see the patient on this day for placement of a Pleurx catheter. Oncology follow-up with Dr. Grayland Ormond is RA scheduled. Follow-up in this office in 3-4 months.  She may use over-the-counter diphenhydramine as needed for urticaria. I also suggested diphenhydramine lotion may be applied to the rash.  I have reviewed this patient's medical problems, current medications and therapies and prior pulmonary office notes in evaluation and formulation of the above assessment and plan   Merton Border, MD PCCM service Mobile [phone] Pager [phone] 12/11/2016 1:33 PM

## 2016-12-11 NOTE — Patient Instructions (Signed)
We have spoken with you today about your Pleurx Catheter Insertion and your Port Placement. These have been scheduled for 12/13/16 with Dr. Genevive Bi at [facility].  After your visit today, you will proceed to the Pre-admission department. Pre-admit testing is located in the [place], 1st Floor, [address]. It is right down the hall from our office. You will see an anesthesia nurse at this visit and will have any necessary pre-op testing completed.  Please refer to your [facility]) Pre-care sheet for further information.     You have been provided a Pleurx Folder and DVD. Read through all information given and call with any questions or concerns.  PleurX Patient Navigators are available for any support questions [phone].    Process of Pleurx Placement and Follow-up: 1. Your Nurse today will send a home supply request form to the medical supply- provider and an education referral form to [place] for you.   You will be educated to become independent in caring for your new device. 2. Home Health will touch base with you prior to your surgery (on most occasions,  unless surgery is urgent). If you do not hear from [facility] within 2  days, please call [phone]. 3. You will have your Pleurx Catheter placed and most likely spend 1-2 nights in  the hospital. Your catheter will be drained while hospitalized and the floor nurses will begin your educaion. 4. You will be discharged from the hospital with a Pleurx Kit with 4 bottles and  dressing kits. 5. When you return home, you should have a voicemail on your phone giving you a  phone number to call to set up delivery of supplies to your home. If you do not  receive this phone call, please call [phone], Option 1 New Patient, [place]. 6. You will be seen by a nurse back in the office for your first drainage, 2-3 days  after discharge and an additional appointment 10-14 days from your surgery for  your sutures to be removed.  You  and your family will take over the drainage procedure at home and home health will be discharged within two weeks'  time.  7. We will touch base with your provider who ordered your catheter after your 1st  post-op and find out how often and how much they would like drained. We will  inform your home health nurse of this information. 8. [place] Nurse will begin helping with education of your Pleurx and  dressing changes for approximately 8-10 days..    Port-a-Cath [facility]) A central line is a soft, flexible tube (catheter) that can be used to collect blood for testing or to give medicine or nutrition through a vein. The tip of the central line ends in a large vein just above the heart called the vena cava. A central line may be placed because:  You need to get medicines or fluids through an IV tube for a long period of time.  You need nutrition but cannot eat or absorb nutrients.  The veins in your hands or arms are hard to access.  You need to have blood taken often for blood tests.  You need a blood transfusion  You need chemotherapy or dialysis.  There are many types of central lines:  Peripherally inserted central catheter (PICC) line. This type is used for intermediate access to long-term access of one week or more. It can be used to draw blood and give fluids or medicines. A PICC  looks like an IV tube, but it goes up the arm to the heart. It is usually inserted in the upper arm and taped in place on the arm.  Tunneled central line. This type is used for long-term therapy and dialysis. It is placed in a large vein in the neck, chest, or groin. A tunneled central line is inserted through a small incision made over the vein and is advanced into the heart. It is tunneled beneath the skin and brought out through a second incision.  Non-tunneled central line. This type is used for short-term access, usually of a maximum of 7 days. It is often used in the emergency  department. A non-tunneled central line is inserted in the neck, chest, or groin.  Implanted port. This type is used for long-term therapy. It can stay in place longer than other types of central lines. An implanted port is normally inserted in the upper chest but can also be placed in the upper arm or in the abdomen. It is inserted and removed with surgery, and it is accessed using a special needle.  The type of central line that you receive depends on how long you will need it, your medical condition, and the condition of your veins. What are the risks? Using any type of central line has risks that you should be aware of, including:  Infection.  A blood clot that blocks the central line or forms in the vein and travels to the heart.  Bleeding from the place where the central line was put in.  Developing a hole or crack within the central line. If this happens, the central line will need to be replaced.  Developing an abnormal heart rhythm (arrhythmia). This is rare.  Central line failure.  Follow these instructions at home: Flushing and cleaning the central line  Follow instructions from the health care provider about flushing and cleaning the central line.  Wear a mask when flushing or cleaning the central line.  Before you flush or clean the central line: ? Wash your hands with soap and water. ? Clean the central line hub with rubbing alcohol. Insertion site care  Keep the insertion site of your central line clean and dry at all times.  Check your incision or central line site every day for signs of infection. Check for: ? More redness, swelling, or pain. ? More fluid or blood. ? Warmth. ? Pus or a bad smell. General instructions  Follow instructions from your health care provider for the type of device that you have.  If the central line accidentally gets pulled on, make sure: ? The bandage (dressing) is okay. ? There is no bleeding. ? The line has not been pulled  out.  Return to your normal activities as told by your health care provider. Ask your health care provider what activities are safe for you. You may be restricted from lifting or making repetitive arm movements on the side with the catheter.  Do not swim or bathe unless your health care provider approves.  Keep your dressing dry. Your health care provider can instruct you about how to keep your specific type of dressing from getting wet.  Keep all follow-up visits as told by your health care provider. This is important. Contact a health care provider if:  You have more redness, swelling, or pain around your incision.  You have more fluid or blood coming from your incision.  Your incision feels warm to the touch.  You have pus or a  bad smell coming from your incision. Get help right away if:  You have: ? Chills. ? A fever. ? Shortness of breath. ? Trouble breathing. ? Chest pain. ? Swelling in your neck, face, chest, or arm on the side of your central line.  You are coughing.  You feel your heart beating rapidly or skipping beats.  You feel dizzy or you faint.  Your incision or central line site has red streaks spreading away from the area.  Your incision or central line site is bleeding and does not stop.  Your central line is difficult to flush or will not flush.  You do not get a blood return from the central line.  Your central line gets loose or comes out.  Your central line gets damaged.  Your catheter leaks when flushed or when fluids are infused into it. This information is not intended to replace advice given to you by your health care provider. Make sure you discuss any questions you have with your health care provider. Document Released: 04/27/2005 Document Revised: 11/03/2015 Document Reviewed: 10/13/2015 Elsevier Interactive Patient Education  2017 [employer].

## 2016-12-11 NOTE — Patient Instructions (Signed)
Your procedure is scheduled on: Thursday, December 14, 2016 Report to Same Day Surgery on the 2nd floor in the [place]. To find out your arrival time, please call [phone] between 1PM - 3PM on: Wednesday, December 13, 2016  REMEMBER: Instructions that are not followed completely may result in serious medical risk up to and including death; or upon the discretion of your surgeon and anesthesiologist your surgery may need to be rescheduled.  Do not eat food or drink liquids after midnight. No gum chewing or hard candies.  You may however, drink CLEAR liquids up to 2 hours before you are scheduled to arrive at the hospital for your procedure.  Do not drink clear liquids within 2 hours of your scheduled arrival to the hospital as this may lead to your procedure being delayed or rescheduled.  Clear liquids include: - water  - apple juice without pulp - clear gatorade - black coffee or tea (NO milk, creamers, sugars) DO NOT drink anything not on this list.  Type 1 and Type 2 diabetics should only drink water.  No Alcohol for 24 hours before or after surgery.  No Smoking for 24 hours prior to surgery.  Notify your doctor if there is any change in your medical condition (cold, fever, infection).  Do not wear jewelry, make-up, hairpins, clips or nail polish.  Do not wear lotions, powders, or perfumes.   Do not shave 48 hours prior to surgery. Men may shave face and neck.  Contacts and dentures may not be worn into surgery.  Do not bring valuables to the hospital. [facility] is not responsible for any belongings or valuables.   TAKE THESE MEDICATIONS THE MORNING OF SURGERY WITH A SIP OF WATER:  1.  AMIODARONE 2.  DILTIAZEM 3.  OMEPRAZOLE (TAKE ONE TABLET THE NIGHT BEFORE SURGERY AND ONE TABLET THE MORNING OF SURGERY) - helps prevent nausea after surgery   Use CHG Soap or wipes as directed on instruction sheet.  Follow recommendations from Cardiologist, Pulmonologist  or PCP regarding stopping Aspirin, Coumadin, Plavix, Eliquis, Pradaxa, or Pletal.  NOW!!  Stop Anti-inflammatories such as Advil, Aleve, Ibuprofen, Motrin, Naproxen, Naprosyn, Goodie powder, or aspirin products. (May take Tylenol if needed.)  NOW! Stop supplements until after surgery. (May continue Vitamin D.)  If you are being admitted to the hospital overnight, leave your suitcase in the car. After surgery it may be brought to your room.  If you are being discharged the day of surgery, you will not be allowed to drive home. You will need someone to drive you home and stay with you that night.   If you are taking public transportation, you will need to have a responsible adult to with you.  Please call the number above if you have any questions about these instructions.

## 2016-12-11 NOTE — Telephone Encounter (Signed)
Patient to see Dr. Donivan Scull Nurse Practioner, Murray Hodgkins at [phone] tomorrow, 12/12/16 for cardiac clearance.  Clearance has been faxed to [phone] with positive confirmation at this time.

## 2016-12-11 NOTE — Progress Notes (Signed)
Patient ID: [name], female   DOB: [birth year], 83 y.o.   MRN: [id number]  Chief Complaint  Patient presents with  . New Patient (Initial Visit)    Pleural Effusion    Referred By Dr. Leonidas Romberg Reason for Referral Recurrent left-sided pleural effusion  HPI Location, Quality, Duration, Severity, Timing, Context, Modifying Factors, Associated Signs and Symptoms.  [name] is a 83 y.o. female.  About one month ago she noticed increasing shortness of breath and was found to have a large left-sided pleural effusion. She did have a thoracentesis done and developed an episode of atrial fibrillation postop. She was in the hospital and was seen by Dr. Delight Hoh and Dr. Tanda Rockers.  The fluid cytology was negative and she had a PET scan done. That revealed a mass in the lateral chest wall as well as several mediastinal lymph nodes. She underwent a percutaneous biopsy revealing metastatic squamous cell carcinoma. She has had a repeat chest x-ray made on Friday of last week showing a recurrent large left-sided pleural effusion. She states that she has been getting more short of breath. She also developed a rash over the last few days. She does not complain of any significant pain at the biopsy site. Of note is that she had her shortness of breath relieved with the last thoracentesis. She stopped taking her eloquence over the weekend in preparation of some type of surgical intervention.   Past Medical History:  Diagnosis Date  . COPD (chronic obstructive pulmonary disease) ([place])   . GU (gastric peptic ulcer)     Past Surgical History:  Procedure Laterality Date  . ABDOMINAL HYSTERECTOMY    . CHOLECYSTECTOMY      Family History  Problem Relation Age of Onset  . Hypertension Father   . Heart disease Father   . Congestive Heart Failure Mother   . Atrial fibrillation Sister     Social History Social History  Substance Use Topics  . Smoking status: Former Smoker    Packs/day:  1.00    Years: 68.00    Quit date: 11/06/2016  . Smokeless tobacco: Never Used  . Alcohol use No    Allergies  Allergen Reactions  . Sulfa Antibiotics Other (See Comments)    Seizures     Current Outpatient Prescriptions  Medication Sig Dispense Refill  . acetaminophen (TYLENOL) 325 MG tablet Take 650 mg by mouth once.    Marland Kitchen amiodarone (PACERONE) 200 MG tablet Take 1 tablet (200 mg total) by mouth 2 (two) times daily. 60 tablet 0  . cetirizine (ZYRTEC) 10 MG tablet Take 10 mg by mouth daily as needed for allergies.    . cholecalciferol (VITAMIN D) 1000 units tablet Take 1,000 Units by mouth daily.    Marland Kitchen diltiazem (CARDIZEM CD) 180 MG 24 hr capsule Take 1 capsule (180 mg total) by mouth daily. 30 capsule 0  . furosemide (LASIX) 20 MG tablet Take 1 tablet by mouth daily as needed. For EDEMA  3  . levofloxacin (LEVAQUIN) 250 MG tablet Take 1 tablet (250 mg total) by mouth daily. 7 tablet 0  . nicotine (NICODERM CQ - DOSED IN MG/24 HOURS) 21 mg/24hr patch Place 1 patch (21 mg total) onto the skin daily. 28 patch 0  . omeprazole (PRILOSEC) 20 MG capsule Take 20 mg by mouth daily.    Marland Kitchen apixaban (ELIQUIS) 5 MG TABS tablet Take 1 tablet (5 mg total) by mouth 2 (two) times daily. (Patient not taking: Reported on 12/11/2016) 60  tablet 0   No current facility-administered medications for this visit.       Review of Systems A complete review of systems was asked and was negative except for the following positive findingsAtrial fibrillation, swelling of her lower extremities, shortness of breath, wheezing, heartburn, easy bruising.  Blood pressure 123/77, pulse 80, temperature 98.7 F (37.1 C), temperature source Oral, resp. rate (!) 24, height 5\' 3"  (1.6 m), weight 170 lb 9.6 oz (77.4 kg), SpO2 95 %.  Physical Exam CONSTITUTIONAL:  Pleasant, well-developed, well-nourished, and in no acute distress. EYES: Pupils equal and reactive to light, Sclera non-icteric EARS, NOSE, MOUTH AND THROAT:   The oropharynx was clear.  Dentition is good repair.  Oral mucosa pink and moist. LYMPH NODES:  Lymph nodes in the neck and axillae were normal RESPIRATORY:  Lungs were clear on the right but decreased on the left.  Normal respiratory effort without pathologic use of accessory muscles of respiration CARDIOVASCULAR: Heart was regular without murmurs.  There were no carotid bruits. GI: The abdomen was soft, nontender, and nondistended. There were no palpable masses. There was no hepatosplenomegaly. There were normal bowel sounds in all quadrants. GU:  Rectal deferred.   MUSCULOSKELETAL:  Normal muscle strength and tone.  No clubbing or cyanosis.   SKIN:  There were no pathologic skin lesions.  There were no nodules on palpation. NEUROLOGIC:  Sensation is normal.  Cranial nerves are grossly intact. PSYCH:  Oriented to person, place and time.  Mood and affect are normal.  Data Reviewed CXR and CT/PET  I have personally reviewed the patient's imaging, laboratory findings and medical records.    Assessment    Recurrent left sided pleural effusion.    Plan    I had a long discussion today with Dr. Leonidas Romberg and with Dr. Delight Hoh. He is now aware of the diagnosis of metastatic squamous cell carcinoma. He does believe that she would be a good candidate for chemotherapy. Therefore I discussed in detail with her and her daughter the indications and risks of Pleurx catheter insertion as well as Port-A-Cath insertion. Risks of bleeding, infection, pneumothorax and death were all reviewed. She appears to have a very supportive family who are active in her care and she should have a good result. We also discussed the possibility of removing the catheters at a later date. All questions were answered.  She will need clearance by Dr. Candis Musa prior to surgery.       [name], MD 12/11/2016, 11:10 AM

## 2016-12-11 NOTE — Pre-Procedure Instructions (Signed)
Copy and pasted EKG from the ED visit on 12/08/2016:   EKG  I, Nance Pear, attending physician, personally viewed and interpreted this EKG  EKG Time: 1258 Rate: 77 Rhythm: normal sinus rhythm Axis: normal Intervals: qtc 467 QRS: low voltage ST changes: no st elevation Impression: abnormal ekg

## 2016-12-12 ENCOUNTER — Encounter: Payer: Self-pay | Admitting: Nurse Practitioner

## 2016-12-12 ENCOUNTER — Telehealth: Payer: Self-pay

## 2016-12-12 ENCOUNTER — Ambulatory Visit (INDEPENDENT_AMBULATORY_CARE_PROVIDER_SITE_OTHER): Payer: Medicare HMO | Admitting: Nurse Practitioner

## 2016-12-12 ENCOUNTER — Telehealth: Payer: Self-pay | Admitting: Cardiothoracic Surgery

## 2016-12-12 ENCOUNTER — Telehealth: Payer: Self-pay | Admitting: *Deleted

## 2016-12-12 VITALS — BP 140/80 | HR 79 | Ht 63.0 in | Wt 169.5 lb

## 2016-12-12 DIAGNOSIS — J910 Malignant pleural effusion: Secondary | ICD-10-CM

## 2016-12-12 DIAGNOSIS — I4819 Other persistent atrial fibrillation: Secondary | ICD-10-CM

## 2016-12-12 DIAGNOSIS — I481 Persistent atrial fibrillation: Secondary | ICD-10-CM | POA: Diagnosis not present

## 2016-12-12 DIAGNOSIS — C3492 Malignant neoplasm of unspecified part of left bronchus or lung: Secondary | ICD-10-CM | POA: Insufficient documentation

## 2016-12-12 DIAGNOSIS — L270 Generalized skin eruption due to drugs and medicaments taken internally: Secondary | ICD-10-CM | POA: Diagnosis not present

## 2016-12-12 MED ORDER — HYDROXYZINE HCL 25 MG PO TABS: 25.0000 mg | ORAL_TABLET | Freq: Four times a day (QID) | ORAL | 0 refills | Status: DC | PRN

## 2016-12-12 MED ORDER — POTASSIUM CHLORIDE CRYS ER 20 MEQ PO TBCR: 40.0000 meq | EXTENDED_RELEASE_TABLET | Freq: Two times a day (BID) | ORAL | 0 refills | Status: DC

## 2016-12-12 NOTE — Patient Instructions (Signed)
Medication Instructions:  Your physician has recommended you make the following change in your medication:  1- STOP Amiodarone. 2- TAKE Hydroxyzine 25 mg (1 tablet) by mouth every 6 hours as needed for itching.   Labwork: none  Testing/Procedures: none  Follow-Up: Your physician recommends that you schedule a follow-up appointment in: [place].  If you need a refill on your cardiac medications before your next appointment, please call your pharmacy.

## 2016-12-12 NOTE — Telephone Encounter (Signed)
Referral Information prepared for [facility]. Kimberly notified to pick-up.  Pleurx Form faxed to [facility] for Supplies with positive confirmation.

## 2016-12-12 NOTE — Progress Notes (Signed)
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  INTERVAL HISTORY: Patient returns to clinic today for further evaluation, hospital follow-up, and treatment planning. She continues to feel weak and fatigued. She has a rash all over her body since initiating amiodarone for atrial fibrillation.  She has no neurologic complaints. She has moderate rib pain at the site of the known metastasis. She has a fair appetite, but denies weight loss. She has no chest pain, shortness of breath, cough, or hemoptysis. She denies any nausea, vomiting, constipation, or diarrhea. She has no urinary complaints. Patient offers no further specific complaints today.  REVIEW OF SYSTEMS:   Review of Systems  Constitutional: Positive for malaise/fatigue. Negative for fever and weight loss.  Respiratory: Negative.  Negative for cough and shortness of breath.   Cardiovascular: Negative.  Negative for chest pain and leg swelling.  Gastrointestinal: Negative.  Negative for abdominal pain.  Genitourinary: Negative.   Musculoskeletal: Positive for back pain.  Skin: Positive for itching and rash.  Neurological: Positive for weakness. Negative for sensory change.  Psychiatric/Behavioral: Negative.  The patient is not nervous/anxious.     As per HPI. Otherwise, a complete review of systems is negative.  PAST MEDICAL HISTORY: Past Medical History:  Diagnosis Date  . COPD (chronic obstructive pulmonary disease) ([place])   . GU (gastric peptic ulcer)   . Lung cancer ([place])   . Malignant pleural effusion   . PAF (paroxysmal atrial fibrillation) ([place]) 10/2016   a. 10/2016 post thoracentesis ; b. 10/2016 Echo: EF 65-70%, mild LVH;  c. Amio/Eliquis  initiated (CHA2DSVASc = 3).  . Pneumonia   . Wrist fracture 1993   bilateral    PAST SURGICAL HISTORY: Past Surgical History:  Procedure Laterality Date  . ABDOMINAL HYSTERECTOMY    . CATARACT EXTRACTION W/ INTRAOCULAR LENS  IMPLANT, BILATERAL    . CHOLECYSTECTOMY    . TONSILLECTOMY      FAMILY HISTORY: Family History  Problem Relation Age of Onset  . Hypertension Father   . Heart disease Father   . Heart attack Father   . Congestive Heart Failure Mother   . Stroke Mother   . Atrial fibrillation Sister     ADVANCED DIRECTIVES (Y/N):  N  HEALTH MAINTENANCE: Social History  Substance Use Topics  . Smoking status: Former Smoker    Packs/day: 1.00    Years: 68.00    Quit date: 11/06/2016  . Smokeless tobacco: Never Used  . Alcohol use No     Colonoscopy:  PAP:  Bone density:  Lipid panel:  Allergies  Allergen Reactions  . Sulfa Antibiotics Other (See Comments)    Seizures   . Levaquin [Levofloxacin] Hives and Itching    Current Outpatient Prescriptions  Medication Sig Dispense Refill  . acetaminophen (TYLENOL) 325 MG tablet Take 650 mg by mouth daily as needed.     . cetirizine (ZYRTEC) 10 MG tablet Take 10 mg by mouth daily as needed for allergies.    . cholecalciferol (VITAMIN D) 1000 units tablet Take 1,000 Units by mouth daily.    . furosemide (LASIX) 20 MG tablet Take 1 tablet by mouth daily as needed. For EDEMA  3  . hydrOXYzine (ATARAX/VISTARIL) 25 MG tablet Take 1 tablet (25 mg  total) by mouth every 6 (six) hours as needed. For itching. 120 tablet 0  . omeprazole (PRILOSEC) 20 MG capsule Take 20 mg by mouth daily.    . potassium chloride SA (K-DUR,KLOR-CON) 20 MEQ tablet Take 2 tablets (40 mEq total) by mouth 2 (two) times daily. 20 tablet 0  . amiodarone (PACERONE) 200 MG tablet Take 200 mg by mouth 2 (two) times daily.    Marland Kitchen apixaban (ELIQUIS) 5 MG TABS tablet Take 1 tablet (5 mg total) by mouth 2 (two) times daily. 60 tablet 0  . diltiazem (CARDIZEM  CD) 180 MG 24 hr capsule Take 1 capsule (180 mg total) by mouth daily. 90 capsule 3  . lidocaine-prilocaine (EMLA) cream Apply to affected area once 30 g 3  . methylPREDNISolone (MEDROL DOSEPAK) 4 MG TBPK tablet Take as directed 21 tablet 0  . nicotine (NICODERM CQ - DOSED IN MG/24 HOURS) 21 mg/24hr patch Place 1 patch (21 mg total) onto the skin daily. (Patient not taking: Reported on 12/13/2016) 28 patch 0  . ondansetron (ZOFRAN) 8 MG tablet Take 1 tablet (8 mg total) by mouth 2 (two) times daily as needed for refractory nausea / vomiting. 60 tablet 2  . prochlorperazine (COMPAZINE) 10 MG tablet Take 1 tablet (10 mg total) by mouth every 6 (six) hours as needed (Nausea or vomiting). 60 tablet 2   No current facility-administered medications for this visit.     OBJECTIVE: Vitals:   12/13/16 1548  BP: 119/77  Pulse: 82  Resp: 18  Temp: 97.7 F (36.5 C)     Body mass index is 29.83 kg/m.    ECOG FS:2 - Symptomatic, <50% confined to bed  General: Well-developed, well-nourished, no acute distress. Eyes: Pink conjunctiva, anicteric sclera. Lungs: Clear to auscultation bilaterally. Heart: Regular rate and rhythm. No rubs, murmurs, or gallops. Abdomen: Soft, nontender, nondistended. No organomegaly noted, normoactive bowel sounds. Musculoskeletal: No edema, cyanosis, or clubbing. Neuro: Alert, answering all questions appropriately. Cranial nerves grossly intact. Skin: Diffuse maculopapular rash noted. Psych: Normal affect.    LAB RESULTS:  Lab Results  Component Value Date   NA 136 12/11/2016   K 3.0 (L) 12/11/2016   CL 97 (L) 12/11/2016   CO2 26 12/11/2016   GLUCOSE 120 (H) 12/11/2016   BUN 13 12/11/2016   CREATININE 0.86 12/11/2016   CALCIUM 8.8 (L) 12/11/2016   PROT 6.7 12/11/2016   ALBUMIN 3.2 (L) 12/11/2016   AST 16 12/11/2016   ALT 9 (L) 12/11/2016   ALKPHOS 91 12/11/2016   BILITOT 0.6 12/11/2016   GFRNONAA >60 12/11/2016   GFRAA >60 12/11/2016    Lab Results    Component Value Date   WBC 8.5 12/11/2016   NEUTROABS 5.4 12/11/2016   HGB 12.6 12/11/2016   HCT 36.7 12/11/2016   MCV 81.7 12/11/2016   PLT 300 12/11/2016     STUDIES: Dg Chest 1 View  Result Date: 12/14/2016 CLINICAL DATA:  Left thoracentesis . EXAM: CHEST 1 VIEW COMPARISON:  12/08/2016. FINDINGS: Interim left left thoracentesis. Removal of significant amount of left pleural fluid with minimal residual effusion. No pneumothorax. Cardiomegaly again noted. Mild bilateral interstitial prominence. Mild component CHF may be present. Pneumonitis cannot be excluded . IMPRESSION: 1. Left thoracentesis with removal of significant amount left pleural fluid. No evidence of pneumothorax . 2. Cardiomegaly with mild bilateral interstitial prominence. Mild component of CHF view present. Mild pneumonitis cannot be excluded . Electronically Signed   By: Marcello Moores  Register   On: 12/14/2016 14:36  Dg Chest 2 View  Result Date: 12/08/2016 CLINICAL DATA:  Shortness of breath. EXAM: CHEST  2 VIEW COMPARISON:  Radiograph of November 12, 2016. FINDINGS: Stable cardiomegaly. Atherosclerosis of thoracic aorta is noted. No pneumothorax is noted. Large left pleural effusion is noted with probable underlying atelectasis or infiltrate. Minimal right basilar subsegmental atelectasis. Bony thorax is unremarkable. IMPRESSION: Aortic atherosclerosis. Large left pleural effusion is stable with probable underlying atelectasis or infiltrate. Electronically Signed   By: Marijo Conception, M.D.   On: 12/08/2016 13:39   Nm Pet Image Initial (pi) Skull Base To Thigh  Result Date: 11/29/2016 CLINICAL DATA:  Initial treatment strategy for left lung mass. EXAM: NUCLEAR MEDICINE PET SKULL BASE TO THIGH TECHNIQUE: 12.3 mCi F-18 FDG was injected intravenously. Full-ring PET imaging was performed from the skull base to thigh after the radiotracer. CT data was obtained and used for attenuation correction and anatomic localization. FASTING BLOOD  GLUCOSE:  Value: 104 mg/dl COMPARISON:  CT chest 11/13/2016. FINDINGS: NECK: No hypermetabolic lymph nodes in the neck. CHEST: Multiple hypermetabolic parenchymal and subpleural nodules are identified in the left lung. 2.6 cm left posterolateral nodule, behind the aorta demonstrates SUV max = 7.9. Hypermetabolism in the left hilum demonstrates SUV max = 10.4. 2 cm soft tissue nodule between the left inferior pulmonary vein and aorta demonstrates SUV max = 15.5. Left lower lobe cavitary lesion seen on the prior study is in a different location due to pleural fluid and atelectasis with SUV max = 9.7. 3.6 cm posterior left chest wall lesion with associated destruction of the posterior tenth rib demonstrates SUV max = 12.3. An area of subpleural airspace disease in the inferior right lung base (image 120 series 4) is hypermetabolic with SUV max = 5.5. ABDOMEN/PELVIS: No abnormal hypermetabolic activity within the liver, pancreas, adrenal glands, or spleen. No hypermetabolic lymph nodes in the abdomen or pelvis. Tiny low-density lesion lateral segment left liver on CT images is stable since prior CT scan and likely a cyst. Small bilateral adrenal nodules have average attenuation of less than 10 Hounsfield units and show no hypermetabolism on PET imaging, features most consistent with benign adrenal adenomas. There is abdominal aortic atherosclerosis without aneurysm. Stable appearance left renal cyst since abdomen and pelvis CT of 09/21/2009. SKELETON: No focal hypermetabolic activity to suggest skeletal metastasis. IMPRESSION: 1. Multiple hypermetabolic lung and pleural-based lesions in the left hemithorax. There is hypermetabolic probable lymph node adjacent to the lower thoracic aorta and a hypermetabolic left chest wall at the level of the tenth rib. 2. Subtle area of subpleural airspace opacity in the right lung base is hypermetabolic. This may be infectious/inflammatory although metastatic disease not excluded. 3.  No definite metastatic disease in the abdomen or pelvis. 4. Bilateral adrenal adenomas. 5.  Aortic Atherosclerois (ICD10-170.0) Electronically Signed   By: Misty Stanley M.D.   On: 11/29/2016 15:30   Ct Biopsy  Result Date: 12/06/2016 INDICATION: Concern for metastatic lung cancer. Please perform CT-guided biopsy for tissue diagnostic purposes. EXAM: CT-GUIDED BIOPSY OF HYPERMETABOLIC LEFT LATERAL CHEST WALL MASS. COMPARISON:  PET-CT - 11/29/2016; chest CT - 11/13/2016 MEDICATIONS: None. ANESTHESIA/SEDATION: Fentanyl 37.5 mcg IV; Versed 1 mg IV Sedation time: 13 minutes; The patient was continuously monitored during the procedure by the interventional radiology nurse under my direct supervision. CONTRAST:  None COMPLICATIONS: None immediate. PROCEDURE: Informed consent was obtained from the patient following an explanation of the procedure, risks, benefits and alternatives. The patient understands,agrees and consents for the procedure. All questions  were addressed. A time out was performed prior to the initiation of the procedure. The patient was positioned supine, slightly RPO on the CT table and a limited chest CT was performed for procedural planning demonstrating unchanged size and appearance of the hypermetabolic mass involving the lateral aspect of the left tenth rib measuring approximately 3.2 x 2.5 cm (image 40, series 3). The operative site was prepped and draped in the usual sterile fashion. Under sterile conditions and local anesthesia, a 17 gauge coaxial needle was advanced into the peripheral aspect of the nodule. Positioning was confirmed with intermittent CT fluoroscopy and followed by the acquisition of 5 core needle biopsies with an 18 gauge core needle biopsy device. The coaxial needle was removed and superficial hemostasis was achieved with manual compression. Limited post procedural chest CT was negative for pneumothorax or additional complication. A dressing was placed. The patient tolerated  the procedure well without immediate postprocedural complication. The patient was escorted to have an upright chest radiograph. IMPRESSION: Technically successful CT guided core needle core biopsy of indeterminate hypermetabolic mass involving the lateral aspect of the left tenth rib. Electronically Signed   By: Sandi Mariscal M.D.   On: 12/06/2016 13:41   US Thoracentesis Asp Pleural Space W/img Guide  Result Date: 12/14/2016 INDICATION: Malignant left pleural effusion. EXAM: ULTRASOUND GUIDED LEFT THORACENTESIS MEDICATIONS: None. COMPLICATIONS: None immediate. PROCEDURE: An ultrasound guided thoracentesis was thoroughly discussed with the patient and questions answered. The benefits, risks, alternatives and complications were also discussed. The patient understands and wishes to proceed with the procedure. Written consent was obtained. Ultrasound was performed to localize and mark an adequate pocket of fluid in the left chest. The area was then prepped and draped in the normal sterile fashion. 1% Lidocaine was used for local anesthesia. Under ultrasound guidance a 6 Fr Safe-T-Centesis catheter was introduced. Thoracentesis was performed. The catheter was removed and a dressing applied. FINDINGS: A total of approximately 1.3 L of amber colored fluid was removed. IMPRESSION: Successful ultrasound guided left thoracentesis yielding 1.3 L of pleural fluid. Electronically Signed   By: Markus Daft M.D.   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 Patient will require an MRI of the brain to complete staging workup. Patient has agreed to palliative chemotherapy with carboplatinum and Taxol, but given her advanced age will dose reduce carboplatinum and give weekly Taxol. Patient will receive carboplatinum AUC 5 on day 1 and Taxol 80 mg/m on days 1, 8, and  15. This will be a 28 day cycle. Patient has indicated that if her quality of life is effected negatively or side effects are intolerable, she would likely discontinue treatment. Hospice and tied of care were discussed, the patient is not ready for this at this time. PDL 1 testing is pending. Return to clinic on December 21, 2016 to receive cycle 1, day 1 of treatment. 2. Rib pain: Patient was given a referral to radiation oncology for palliative treatment.  Approximately 30 minutes was spent in discussion of which greater than 50% was consultation.  Patient expressed understanding and was in agreement with this plan. She also understands that She can call clinic at any time with any questions, concerns, or complaints.   Cancer Staging Squamous cell carcinoma lung, left ([place]) Staging form: Lung, AJCC 8th Edition - Clinical stage from 12/13/2016:  Stage IVA (cT4, cN0, cM1a) - Signed by [name], MD on 12/13/2016   [name], MD   12/17/2016 9:42 AM

## 2016-12-12 NOTE — Telephone Encounter (Signed)
Patient's Pre-op Potassium is 3.1. She requires supplementation prior to surgery. According to our protocol, she was given 38meq K-Dur BID x 5 days. We will recheck her potassium the morning prior to surgery.  This information was given to patient's daughter, Shirlean Mylar at this time. She verbalizes understanding and will pick up prescription immediately at [facility].  She states that she has been taking OTC Potassium Gluconate 99mg  Daily. She was instructed to STOP taking this medication and start on prescription potassium immediately.  Dr. Genevive Bi made aware of this.

## 2016-12-12 NOTE — Progress Notes (Signed)
Office Visit    Patient Name: [name] Date of Encounter: 12/12/2016  Primary Care Provider:  Albina Billet, MD Primary Cardiologist:  Andree Coss, MD   Chief Complaint    83 year old female with a history of tobacco abuse, COPD,-year-old bowel syndrome, and lung cancer, who was recently admitted secondary to dyspnea and malignant pleural effusion requiring thoracentesis and developed atrial fibrillation with rapid ventricular response.  Past Medical History    Past Medical History:  Diagnosis Date  . COPD (chronic obstructive pulmonary disease) ([place])   . GU (gastric peptic ulcer)   . Lung cancer ([place])   . Malignant pleural effusion   . PAF (paroxysmal atrial fibrillation) ([place]) 10/2016   a. 10/2016 post thoracentesis ; b. 10/2016 Echo: EF 65-70%, mild LVH;  c. Amio/Eliquis initiated (CHA2DSVASc = 3).  . Pneumonia   . Wrist fracture 1993   bilateral   Past Surgical History:  Procedure Laterality Date  . ABDOMINAL HYSTERECTOMY    . CATARACT EXTRACTION W/ INTRAOCULAR LENS  IMPLANT, BILATERAL    . CHOLECYSTECTOMY    . TONSILLECTOMY      Allergies  Allergies  Allergen Reactions  . Sulfa Antibiotics Other (See Comments)    Seizures     History of Present Illness    83 year old female with the above complex past medical history including tobacco abuse, COPD, irritable bowel syndrome, and lung cancer. She was recently admitted to [facility] the setting of dyspnea and malignant pleural effusion requiring thoracentesis. Postprocedure, she developed atrial fibrillation with rapid ventricular response and was subsequently placed on amiodarone and eliquis therapy. She was discharged home in atrial fibrillation on amiodarone and diltiazem. She presents for follow-up today. She is tentatively scheduled for placement of a left-sided Pleurx drain and Port-A-Cath on September 27. She has not been having any chest pain or palpitations. She has had ongoing dyspnea on exertion. On  the evening of September 23, she began having itching at the back of her head and neck. By yesterday afternoon, she had a rash across her torso and upper legs. She has been very itchy and using Benadryl without much impact. She has not recently changed deodorant, soaps, detergents. She has not changed her medication regimen since her discharge from the hospital.  Home Medications    Prior to Admission medications   Medication Sig Start Date End Date Taking? Authorizing Provider  acetaminophen (TYLENOL) 325 MG tablet Take 650 mg by mouth daily as needed.    Yes [provider]  cetirizine (ZYRTEC) 10 MG tablet Take 10 mg by mouth daily as needed for allergies.   Yes [provider]  cholecalciferol (VITAMIN D) 1000 units tablet Take 1,000 Units by mouth daily.   Yes [provider]  diltiazem (CARDIZEM CD) 180 MG 24 hr capsule Take 1 capsule (180 mg total) by mouth daily. 11/17/16  Yes [name], MD  furosemide (LASIX) 20 MG tablet Take 1 tablet by mouth daily as needed. For EDEMA 10/07/16  Yes [provider]  nicotine (NICODERM CQ - DOSED IN MG/24 HOURS) 21 mg/24hr patch Place 1 patch (21 mg total) onto the skin daily. 11/17/16  Yes [name], MD  omeprazole (PRILOSEC) 20 MG capsule Take 20 mg by mouth daily.   Yes [provider]  potassium chloride SA (K-DUR,KLOR-CON) 20 MEQ tablet Take 2 tablets (40 mEq total) by mouth 2 (two) times daily. 12/12/16  Yes [name], MD  apixaban (ELIQUIS) 5 MG TABS tablet Take 1  tablet (5 mg total) by mouth 2 (two) times daily. Patient not taking: Reported on 12/12/2016 11/16/16   [name], MD  hydrOXYzine (ATARAX/VISTARIL) 25 MG tablet Take 1 tablet (25 mg total) by mouth every 6 (six) hours as needed. For itching. 12/12/16   [name], NP  levofloxacin (LEVAQUIN) 250 MG tablet Take 1 tablet (250 mg total) by mouth daily. Patient not taking: Reported on 12/12/2016 12/08/16  12/15/16  Nance Pear, MD    Review of Systems    Rash and itching as outlined above. Also with ongoing dyspnea on exertion. She denies chest pain, PND, dizziness, syncope, edema, or early satiety.  All other systems reviewed and are otherwise negative except as noted above.  Physical Exam    VS:  BP 140/80 (BP Location: Left Arm, Patient Position: Sitting, Cuff Size: Normal)   Pulse 79   Ht 5\' 3"  (1.6 m)   Wt 169 lb 8 oz (76.9 kg)   BMI 30.03 kg/m  , BMI Body mass index is 30.03 kg/m. GEN: Well nourished, well developed, in no acute distress.  HEENT: normal.  Neck: Supple, no JVD, carotid bruits, or masses. Cardiac: RRR, no murmurs, rubs, or gallops. No clubbing, cyanosis, edema.  Radials/DP/PT 2+ and equal bilaterally.  Respiratory:  Respirations regular and unlabored, Diminished breath sounds bilaterally, more so in the left base. GI: Soft, nontender, nondistended, BS + x 4. MS: no deformity or atrophy. Skin: warm and dry, diffuse patchy rash across torso/back and upper legs. Neuro:  Strength and sensation are intact. Psych: Normal affect.  Accessory Clinical Findings    ECG - Regular sinus rhythm, 79, no acute ST or T changes.  Assessment & Plan    1. Persistent atrial fibrillation: Patient was recently diagnosed with atrial fibrillation following thoracentesis in August. She left the hospital in A. fib with reasonable rate control on amiodarone and diltiazem therapy. She has also been anticoagulated with eliquis. She is in sinus rhythm today though she does not know when she converted. She continues to take amiodarone but has been holding eliquis for the past 3 days in preparation for thoracentesis/Pleurx catheter placement on Thursday. Ideally she would've remained on anticoagulation for a full 4 weeks following conversion to sinus rhythm, though since we don't know when she converted, and since recurrent effusion with dyspnea takes precedent, she will continue to hold  eliquis. She has developed what appears to be a drug rash without recent change in medications. In looking at her list, amiodarone is a possible culprit with a delayed response. As she is in sinus rhythm, I have asked her to hold amiodarone. We will continue diltiazem and plan to resume eliquis post thoracentesis.   2. Rash: Patient developed rash 2 nights ago that worsened yesterday. This is covering her torso/back/upper legs. She has been very itchy. We reviewed her medications and also any potential changes to things at home that might result in this rash. She cannot identify any. I'm concerned that amiodarone may be playing a role and therefore I will hold this. I have provided her with a prescription for hydroxyzine when necessary. She will follow up with primary care within the next week.  3. Lung cancer/malignant pleural effusion: Pending Pleurx catheter placement on Thursday. Eliquis has been on hold in preparation for this. She had normal LV function by echocardiogram during hospitalization and does not require further ischemic evaluation prior to this procedure. In the setting of holding amiodarone for the time being, we will need to  be on the look out for recurrent atrial fibrillation post procedurally. She will continue diltiazem throughout the perioperative period. Recommend resumption of eliquis post procedure once feasible from a surgical standpoint.  4. Disposition: Patient will follow with primary care in a week to reevaluate rash. Follow up in clinic in one month or sooner if necessary.  Murray Hodgkins, NP 12/12/2016, 4:01 PM

## 2016-12-12 NOTE — Telephone Encounter (Signed)
Order faxed to pathology.

## 2016-12-12 NOTE — Telephone Encounter (Signed)
Pt advised of pre op date/time and sx date. Sx: 12/14/16 with Dr Geanie Kenning placement and port placement.  Pre op: 12/11/16 after clinic.   Patient made aware to call [phone], between 1-3:00pm the day before surgery, to find out what time to arrive.

## 2016-12-12 NOTE — Telephone Encounter (Signed)
-----   Message from Flora Lipps, MD sent at 12/11/2016  9:50 AM EDT ----- Rojelio Brenner, please order US guided thoracentesis for large pleural effusion She did NOT want to be admitted and ER docs asking [place] to order procedure.  thanks

## 2016-12-12 NOTE — Telephone Encounter (Signed)
Per DK, do not order the thoracentesis since DS sent pt yesterday to see Dr. Genevive Bi. Per chart Dr. Genevive Bi will be doing a procedure on a procedure.

## 2016-12-13 ENCOUNTER — Encounter: Payer: Self-pay | Admitting: *Deleted

## 2016-12-13 ENCOUNTER — Inpatient Hospital Stay: Payer: Medicare HMO | Attending: Oncology | Admitting: Oncology

## 2016-12-13 VITALS — BP 119/77 | HR 82 | Temp 97.7°F | Resp 18 | Wt 168.4 lb

## 2016-12-13 DIAGNOSIS — Z7189 Other specified counseling: Secondary | ICD-10-CM

## 2016-12-13 DIAGNOSIS — Z8711 Personal history of peptic ulcer disease: Secondary | ICD-10-CM

## 2016-12-13 DIAGNOSIS — R5383 Other fatigue: Secondary | ICD-10-CM | POA: Diagnosis not present

## 2016-12-13 DIAGNOSIS — Z7901 Long term (current) use of anticoagulants: Secondary | ICD-10-CM

## 2016-12-13 DIAGNOSIS — R531 Weakness: Secondary | ICD-10-CM | POA: Diagnosis not present

## 2016-12-13 DIAGNOSIS — Z87891 Personal history of nicotine dependence: Secondary | ICD-10-CM | POA: Insufficient documentation

## 2016-12-13 DIAGNOSIS — Z8781 Personal history of (healed) traumatic fracture: Secondary | ICD-10-CM | POA: Insufficient documentation

## 2016-12-13 DIAGNOSIS — Z8701 Personal history of pneumonia (recurrent): Secondary | ICD-10-CM | POA: Diagnosis not present

## 2016-12-13 DIAGNOSIS — I480 Paroxysmal atrial fibrillation: Secondary | ICD-10-CM

## 2016-12-13 DIAGNOSIS — J449 Chronic obstructive pulmonary disease, unspecified: Secondary | ICD-10-CM | POA: Diagnosis not present

## 2016-12-13 DIAGNOSIS — C3492 Malignant neoplasm of unspecified part of left bronchus or lung: Secondary | ICD-10-CM | POA: Insufficient documentation

## 2016-12-13 DIAGNOSIS — I700 Atherosclerosis of aorta: Secondary | ICD-10-CM | POA: Diagnosis not present

## 2016-12-13 DIAGNOSIS — J910 Malignant pleural effusion: Secondary | ICD-10-CM | POA: Diagnosis not present

## 2016-12-13 DIAGNOSIS — R0781 Pleurodynia: Secondary | ICD-10-CM | POA: Diagnosis not present

## 2016-12-13 DIAGNOSIS — I509 Heart failure, unspecified: Secondary | ICD-10-CM | POA: Insufficient documentation

## 2016-12-13 DIAGNOSIS — Z79899 Other long term (current) drug therapy: Secondary | ICD-10-CM | POA: Insufficient documentation

## 2016-12-13 MED ORDER — METHYLPREDNISOLONE 4 MG PO TBPK: ORAL_TABLET | ORAL | 0 refills | Status: DC

## 2016-12-13 MED ORDER — CEFAZOLIN SODIUM-DEXTROSE 2-4 GM/100ML-% IV SOLN: 2.0000 g | INTRAVENOUS | Status: DC

## 2016-12-13 NOTE — Progress Notes (Signed)
Patient here today for initial evaluation regarding lung cancer, seen in the hospital. Patient reports rash that is all over her body, states it is suspected to be reaction from amiodarone. Amiodarone has been stopped at this time, patient started on Hydroxyzine yesterday but this has not helped much.

## 2016-12-13 NOTE — Progress Notes (Signed)
  Oncology Nurse Navigator Documentation  Navigator Location: [facility] (12/13/16 1600)   )Navigator Encounter Type: Follow-up Appt (12/13/16 1600)     Confirmed Diagnosis Date: 12/08/16 (12/13/16 1600)               Patient Visit Type: MedOnc (12/13/16 1600) Treatment Phase: Pre-Tx/Tx Discussion (12/13/16 1600) Barriers/Navigation Needs: Coordination of Care (12/13/16 1600)   Interventions: Coordination of Care;Referrals (12/13/16 1600)   Coordination of Care: Appts;Chemo (12/13/16 1600)       met with patient during follow up visit with Dr. Grayland Ormond to review pathology results and discuss treatment planning. All questions answered at the time of visit. Reviewed upcoming appts with patient and family. Educational materials given to patient regarding diagnosis and information given regarding supportive services. Contact info given and instructed to call if has any further questions. Pt and family verbalized understanding.           Time Spent with Patient: 30 (12/13/16 1600)

## 2016-12-14 ENCOUNTER — Ambulatory Visit
Admission: RE | Admit: 2016-12-14 | Discharge: 2016-12-14 | Disposition: A | Discharge status: Home / Self Care | Payer: Medicare HMO | Source: Ambulatory Visit | Attending: Cardiothoracic Surgery | Admitting: Cardiothoracic Surgery

## 2016-12-14 ENCOUNTER — Ambulatory Visit: Payer: Medicare HMO | Admitting: Anesthesiology

## 2016-12-14 ENCOUNTER — Encounter
Admission: RE | Disposition: A | Discharge status: Home / Self Care | Payer: Self-pay | Source: Ambulatory Visit | Attending: Cardiothoracic Surgery

## 2016-12-14 ENCOUNTER — Encounter: Payer: Self-pay | Admitting: Anesthesiology

## 2016-12-14 ENCOUNTER — Ambulatory Visit
Admission: RE | Admit: 2016-12-14 | Discharge: 2016-12-14 | Disposition: A | Discharge status: Home / Self Care | Payer: Medicare HMO | Source: Ambulatory Visit | Attending: Diagnostic Radiology | Admitting: Diagnostic Radiology

## 2016-12-14 ENCOUNTER — Other Ambulatory Visit: Payer: Self-pay

## 2016-12-14 ENCOUNTER — Telehealth: Payer: Self-pay

## 2016-12-14 DIAGNOSIS — Z7901 Long term (current) use of anticoagulants: Secondary | ICD-10-CM | POA: Insufficient documentation

## 2016-12-14 DIAGNOSIS — Z79899 Other long term (current) drug therapy: Secondary | ICD-10-CM | POA: Insufficient documentation

## 2016-12-14 DIAGNOSIS — J910 Malignant pleural effusion: Secondary | ICD-10-CM

## 2016-12-14 DIAGNOSIS — J449 Chronic obstructive pulmonary disease, unspecified: Secondary | ICD-10-CM | POA: Insufficient documentation

## 2016-12-14 DIAGNOSIS — C3492 Malignant neoplasm of unspecified part of left bronchus or lung: Secondary | ICD-10-CM | POA: Diagnosis not present

## 2016-12-14 DIAGNOSIS — K279 Peptic ulcer, site unspecified, unspecified as acute or chronic, without hemorrhage or perforation: Secondary | ICD-10-CM | POA: Insufficient documentation

## 2016-12-14 DIAGNOSIS — Z9889 Other specified postprocedural states: Secondary | ICD-10-CM

## 2016-12-14 DIAGNOSIS — I252 Old myocardial infarction: Secondary | ICD-10-CM | POA: Insufficient documentation

## 2016-12-14 DIAGNOSIS — Z961 Presence of intraocular lens: Secondary | ICD-10-CM | POA: Insufficient documentation

## 2016-12-14 DIAGNOSIS — I480 Paroxysmal atrial fibrillation: Secondary | ICD-10-CM | POA: Insufficient documentation

## 2016-12-14 DIAGNOSIS — Z87891 Personal history of nicotine dependence: Secondary | ICD-10-CM | POA: Insufficient documentation

## 2016-12-14 DIAGNOSIS — Z882 Allergy status to sulfonamides status: Secondary | ICD-10-CM | POA: Insufficient documentation

## 2016-12-14 DIAGNOSIS — C3490 Malignant neoplasm of unspecified part of unspecified bronchus or lung: Secondary | ICD-10-CM

## 2016-12-14 SURGERY — CHEST TUBE INSERTION
Anesthesia: General

## 2016-12-14 MED ORDER — FENTANYL CITRATE (PF) 100 MCG/2ML IJ SOLN
INTRAMUSCULAR | Status: AC
  Filled 2016-12-14: qty 2

## 2016-12-14 MED ORDER — CEFAZOLIN SODIUM-DEXTROSE 2-4 GM/100ML-% IV SOLN
INTRAVENOUS | Status: AC
  Filled 2016-12-14: qty 100

## 2016-12-14 MED ORDER — LACTATED RINGERS IV SOLN: INTRAVENOUS | Status: DC

## 2016-12-14 MED ORDER — CHLORHEXIDINE GLUCONATE CLOTH 2 % EX PADS: 6.0000 | MEDICATED_PAD | Freq: Once | CUTANEOUS | Status: DC

## 2016-12-14 MED ORDER — LIDOCAINE HCL (PF) 2 % IJ SOLN
INTRAMUSCULAR | Status: AC
  Filled 2016-12-14: qty 2

## 2016-12-14 MED ORDER — PROPOFOL 10 MG/ML IV BOLUS
INTRAVENOUS | Status: AC
  Filled 2016-12-14: qty 20

## 2016-12-14 SURGICAL SUPPLY — 50 items
BAG DECANTER FOR FLEXI CONT (MISCELLANEOUS) ×2 IMPLANT
BLADE SURG SZ11 CARB STEEL (BLADE) ×2 IMPLANT
CANISTER SUCT 1200ML W/VALVE (MISCELLANEOUS) ×2 IMPLANT
CHLORAPREP W/TINT 26ML (MISCELLANEOUS) ×2 IMPLANT
COVER LIGHT HANDLE STERIS (MISCELLANEOUS) ×4 IMPLANT
DRAIN CHEST DRY SUCT SGL (MISCELLANEOUS) ×2 IMPLANT
DRAPE C-ARM XRAY 36X54 (DRAPES) ×2 IMPLANT
DRAPE INCISE IOBAN 66X45 STRL (DRAPES) ×2 IMPLANT
DRAPE LAPAROTOMY 77X122 PED (DRAPES) ×2 IMPLANT
DRSG TEGADERM 2-3/8X2-3/4 SM (GAUZE/BANDAGES/DRESSINGS) ×2 IMPLANT
DRSG TEGADERM 4X4.75 (GAUZE/BANDAGES/DRESSINGS) ×2 IMPLANT
DRSG TELFA 4X3 1S NADH ST (GAUZE/BANDAGES/DRESSINGS) ×2 IMPLANT
ELECT CAUTERY BLADE TIP 2.5 (TIP) ×2
ELECT REM PT RETURN 9FT ADLT (ELECTROSURGICAL) ×2
ELECTRODE CAUTERY BLDE TIP 2.5 (TIP) ×1 IMPLANT
ELECTRODE REM PT RTRN 9FT ADLT (ELECTROSURGICAL) ×1 IMPLANT
GLOVE SURG SYN 7.5  E (GLOVE) ×1
GLOVE SURG SYN 7.5 E (GLOVE) ×1 IMPLANT
GOWN STRL REUS W/ TWL LRG LVL3 (GOWN DISPOSABLE) ×2 IMPLANT
GOWN STRL REUS W/TWL LRG LVL3 (GOWN DISPOSABLE) ×2
IV NS 500ML (IV SOLUTION) ×1
IV NS 500ML BAXH (IV SOLUTION) ×1 IMPLANT
KIT PLEURX DRAIN CATH 15.5FR (DRAIN) ×2 IMPLANT
KIT RM TURNOVER STRD PROC AR (KITS) ×2 IMPLANT
LABEL OR SOLS (LABEL) ×2 IMPLANT
MARKER SKIN DUAL TIP RULER LAB (MISCELLANEOUS) ×2 IMPLANT
NDL SAFETY 22GX1.5 (NEEDLE) ×2 IMPLANT
NEEDLE FILTER BLUNT 18X 1/2SAF (NEEDLE) ×1
NEEDLE FILTER BLUNT 18X1 1/2 (NEEDLE) ×1 IMPLANT
NS IRRIG 500ML POUR BTL (IV SOLUTION) ×2 IMPLANT
PACK BASIN MINOR ARMC (MISCELLANEOUS) ×2 IMPLANT
PACK PORT-A-CATH (MISCELLANEOUS) ×2 IMPLANT
SUCTION FRAZIER HANDLE 10FR (MISCELLANEOUS) ×1
SUCTION TUBE FRAZIER 10FR DISP (MISCELLANEOUS) ×1 IMPLANT
SUT ETHILON 3-0 FS-10 30 BLK (SUTURE) ×2
SUT ETHILON 4-0 (SUTURE) ×2
SUT ETHILON 4-0 FS2 18XMFL BLK (SUTURE) ×2
SUT PROLENE 2 0 SH DA (SUTURE) ×4 IMPLANT
SUT SILK 1 SH (SUTURE) ×2 IMPLANT
SUT VIC AB 0 SH 27 (SUTURE) ×2 IMPLANT
SUT VIC AB 2-0 SH 27 (SUTURE) ×1
SUT VIC AB 2-0 SH 27XBRD (SUTURE) ×1 IMPLANT
SUT VIC AB 3-0 SH 27 (SUTURE) ×1
SUT VIC AB 3-0 SH 27X BRD (SUTURE) ×1 IMPLANT
SUTURE EHLN 3-0 FS-10 30 BLK (SUTURE) ×1 IMPLANT
SUTURE ETHLN 4-0 FS2 18XMF BLK (SUTURE) ×2 IMPLANT
SYR 30ML LL (SYRINGE) ×2 IMPLANT
SYR 3ML LL SCALE MARK (SYRINGE) ×2 IMPLANT
SYRINGE 10CC LL (SYRINGE) ×2 IMPLANT
TAPE TRANSPORE STRL 2 31045 (GAUZE/BANDAGES/DRESSINGS) ×2 IMPLANT

## 2016-12-14 NOTE — Progress Notes (Signed)
Patient arrived with rash, MD made aware. Postponed case at this time. With thoracentesis this afternoon. Anesthesia made aware.

## 2016-12-14 NOTE — Procedures (Signed)
US-guided left thoracentesis.  Removed 1.3 liters of amber colored fluid.  Minimal blood loss and no immediate complication. CXR pending.

## 2016-12-14 NOTE — Anesthesia Preprocedure Evaluation (Signed)
Anesthesia Evaluation  Patient identified by MRN, date of birth, ID band Patient awake    Reviewed: Allergy & Precautions, H&P , NPO status , Patient's Chart, lab work & pertinent test results  History of Anesthesia Complications Negative for: history of anesthetic complications  Airway Mallampati: III  TM Distance: <3 FB Neck ROM: limited    Dental  (+) Poor Dentition, Chipped, Missing   Pulmonary shortness of breath and with exertion, pneumonia, COPD, former smoker,           Cardiovascular Exercise Tolerance: Poor (-) angina+ Past MI and + DOE  + dysrhythmias Atrial Fibrillation      Neuro/Psych negative neurological ROS  negative psych ROS   GI/Hepatic Neg liver ROS, PUD,   Endo/Other  negative endocrine ROS  Renal/GU      Musculoskeletal   Abdominal   Peds  Hematology negative hematology ROS (+)   Anesthesia Other Findings Patient has a dermal drug reaction (erythema) that started earlier this week that was attributed to amiodarone, no airway symptoms, she reports that the erytema has improved since she was started on steroids.  Patient has cardiac clearance for this procedure.   Past Medical History: No date: COPD (chronic obstructive pulmonary disease) (HCC) No date: GU (gastric peptic ulcer) No date: Lung cancer ([place]) No date: Malignant pleural effusion 10/2016: PAF (paroxysmal atrial fibrillation) ([place])     Comment:  a. 10/2016 post thoracentesis ; b. 10/2016 Echo: EF               65-70%, mild LVH;  c. Amio/Eliquis initiated (CHA2DSVASc               = 3). No date: Pneumonia 1993: Wrist fracture     Comment:  bilateral  Past Surgical History: No date: ABDOMINAL HYSTERECTOMY No date: CATARACT EXTRACTION W/ INTRAOCULAR LENS  IMPLANT, BILATERAL No date: CHOLECYSTECTOMY No date: TONSILLECTOMY  BMI    Body Mass Index:  29.87 kg/m      Reproductive/Obstetrics negative OB ROS                              Anesthesia Physical Anesthesia Plan  ASA: III  Anesthesia Plan: General ETT   Post-op Pain Management:    Induction: Intravenous  PONV Risk Score and Plan:   Airway Management Planned: Oral ETT  Additional Equipment:   Intra-op Plan:   Post-operative Plan: Extubation in OR  Informed Consent: I have reviewed the patients History and Physical, chart, labs and discussed the procedure including the risks, benefits and alternatives for the proposed anesthesia with the patient or authorized representative who has indicated his/her understanding and acceptance.   Dental Advisory Given  Plan Discussed with: Anesthesiologist, CRNA and Surgeon  Anesthesia Plan Comments: (Patient consented for risks of anesthesia including but not limited to:  - adverse reactions to medications - damage to teeth, lips or other oral mucosa - sore throat or hoarseness - Damage to heart, brain, lungs or loss of life  Patient voiced understanding.)        Anesthesia Quick Evaluation

## 2016-12-14 NOTE — Telephone Encounter (Signed)
Patient saw Murray Hodgkins, NP at [facility] on 12/12/16 and was given permission to have Pleurx and Port Placement this morning without further cardiac testing needed. Please see his note on 12/12/16 for further information.  I spoke with Ivin Booty, RN at [facility] this morning who wanted to assure that note was clear that clearance had been given.

## 2016-12-15 ENCOUNTER — Other Ambulatory Visit: Payer: Self-pay | Admitting: Nurse Practitioner

## 2016-12-15 ENCOUNTER — Ambulatory Visit: Payer: Medicare HMO | Admitting: Cardiovascular Disease

## 2016-12-15 MED ORDER — DILTIAZEM HCL ER COATED BEADS 180 MG PO CP24: 180.0000 mg | ORAL_CAPSULE | Freq: Every day | ORAL | 3 refills | Status: DC

## 2016-12-15 NOTE — Telephone Encounter (Signed)
°*  STAT* If patient is at the pharmacy, call can be transferred to refill team.   1. Which medications need to be refilled? (please list name of each medication and dose if known) CARDIZEM 180MG  taken daily  2. Which pharmacy/location (including street and city if local pharmacy) is medication to be sent to? [place]   3. Do they need a 30 day or 90 day supply? 90 day   Pt daughter was not sure if refill was needed,  Pt was also asking if LEVAQUIN 250MG , would need to be refilled Please call

## 2016-12-15 NOTE — Telephone Encounter (Signed)
Patient was to have a chest tube inserted on 12/14/2016.  The patient was instructed by Ignacia Bayley, NP to continue with the Diltiazem throughout the perioperative period.  She was also told to stop amiodarone due to a rash. The patient also asking if the Levaquin needs to be refilled.  Please contact patient for instructions on medications due to patient being unclear on what medications to continue.

## 2016-12-15 NOTE — Telephone Encounter (Signed)
Called patient and spoke with patient and granddaughter with patient's permission. Patient was taking Levaquin thinking it was a heart medication. They realized she started the Levaquin on Sunday which is the same day the rash started. The Levaquin was ordered for patient to take only if needed r/t her port insertion. They realized they think the rash was coming from the Levaquin not the amiodarone. Patient has stopped the Levaquin. I added it to her allergy list. Patient has also stopped amiodarone as advised at last office visit with Ignacia Bayley, NP on 12/12/16. Patient advised to stop Levaquin and to continue to remain off the amiodarone until advised otherwise. Routing to Ignacia Bayley, NP for review.

## 2016-12-17 DIAGNOSIS — Z7189 Other specified counseling: Secondary | ICD-10-CM | POA: Insufficient documentation

## 2016-12-17 MED ORDER — ONDANSETRON HCL 8 MG PO TABS: 8.0000 mg | ORAL_TABLET | Freq: Two times a day (BID) | ORAL | 2 refills | Status: DC | PRN

## 2016-12-17 MED ORDER — PROCHLORPERAZINE MALEATE 10 MG PO TABS: 10.0000 mg | ORAL_TABLET | Freq: Four times a day (QID) | ORAL | 2 refills | Status: DC | PRN

## 2016-12-17 MED ORDER — LIDOCAINE-PRILOCAINE 2.5-2.5 % EX CREA: TOPICAL_CREAM | CUTANEOUS | 3 refills | Status: DC

## 2016-12-17 NOTE — Progress Notes (Signed)
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

## 2016-12-18 ENCOUNTER — Encounter
Admission: RE | Disposition: A | Discharge status: Home / Self Care | Payer: Self-pay | Source: Ambulatory Visit | Attending: Cardiothoracic Surgery

## 2016-12-18 ENCOUNTER — Ambulatory Visit: Payer: Medicare HMO | Admitting: Anesthesiology

## 2016-12-18 ENCOUNTER — Ambulatory Visit: Payer: Medicare HMO

## 2016-12-18 ENCOUNTER — Observation Stay
Admission: RE | Admit: 2016-12-18 | Discharge: 2016-12-19 | Disposition: A | Discharge status: Home / Self Care | Payer: Medicare HMO | Source: Ambulatory Visit | Attending: Cardiothoracic Surgery | Admitting: Cardiothoracic Surgery

## 2016-12-18 DIAGNOSIS — Z8711 Personal history of peptic ulcer disease: Secondary | ICD-10-CM | POA: Insufficient documentation

## 2016-12-18 DIAGNOSIS — Z87891 Personal history of nicotine dependence: Secondary | ICD-10-CM | POA: Diagnosis not present

## 2016-12-18 DIAGNOSIS — C3482 Malignant neoplasm of overlapping sites of left bronchus and lung: Secondary | ICD-10-CM

## 2016-12-18 DIAGNOSIS — J449 Chronic obstructive pulmonary disease, unspecified: Secondary | ICD-10-CM | POA: Diagnosis not present

## 2016-12-18 DIAGNOSIS — Z90710 Acquired absence of both cervix and uterus: Secondary | ICD-10-CM | POA: Diagnosis not present

## 2016-12-18 DIAGNOSIS — Z79899 Other long term (current) drug therapy: Secondary | ICD-10-CM | POA: Diagnosis not present

## 2016-12-18 DIAGNOSIS — Z7901 Long term (current) use of anticoagulants: Secondary | ICD-10-CM | POA: Insufficient documentation

## 2016-12-18 DIAGNOSIS — Z8719 Personal history of other diseases of the digestive system: Secondary | ICD-10-CM | POA: Diagnosis not present

## 2016-12-18 DIAGNOSIS — C3490 Malignant neoplasm of unspecified part of unspecified bronchus or lung: Principal | ICD-10-CM | POA: Diagnosis present

## 2016-12-18 DIAGNOSIS — Z881 Allergy status to other antibiotic agents status: Secondary | ICD-10-CM | POA: Insufficient documentation

## 2016-12-18 DIAGNOSIS — Z882 Allergy status to sulfonamides status: Secondary | ICD-10-CM | POA: Diagnosis not present

## 2016-12-18 DIAGNOSIS — I252 Old myocardial infarction: Secondary | ICD-10-CM | POA: Insufficient documentation

## 2016-12-18 DIAGNOSIS — Z4682 Encounter for fitting and adjustment of non-vascular catheter: Secondary | ICD-10-CM

## 2016-12-18 DIAGNOSIS — I480 Paroxysmal atrial fibrillation: Secondary | ICD-10-CM | POA: Insufficient documentation

## 2016-12-18 DIAGNOSIS — J910 Malignant pleural effusion: Secondary | ICD-10-CM | POA: Insufficient documentation

## 2016-12-18 HISTORY — PX: CHEST TUBE INSERTION: SHX231

## 2016-12-18 HISTORY — PX: PORTACATH PLACEMENT: SHX2246

## 2016-12-18 LAB — POCT I-STAT 4, (NA,K, GLUC, HGB,HCT)
Glucose, Bld: 112 mg/dL — ABNORMAL HIGH (ref 65–99)
HCT: 40 % (ref 36.0–46.0)
HEMOGLOBIN: 13.6 g/dL (ref 12.0–15.0)
Potassium: 4.8 mmol/L (ref 3.5–5.1)
SODIUM: 136 mmol/L (ref 135–145)

## 2016-12-18 SURGERY — CHEST TUBE INSERTION
Anesthesia: General | Laterality: Left

## 2016-12-18 MED ORDER — FENTANYL CITRATE (PF) 100 MCG/2ML IJ SOLN
25.0000 ug | INTRAMUSCULAR | Status: DC | PRN
  Administered 2016-12-18 (×2): 25 ug via INTRAVENOUS

## 2016-12-18 MED ORDER — INFLUENZA VAC SPLIT HIGH-DOSE 0.5 ML IM SUSY
0.5000 mL | PREFILLED_SYRINGE | INTRAMUSCULAR | Status: DC
  Filled 2016-12-18 (×2): qty 0.5

## 2016-12-18 MED ORDER — LIDOCAINE HCL (PF) 2 % IJ SOLN
INTRAMUSCULAR | Status: AC
  Filled 2016-12-18: qty 2

## 2016-12-18 MED ORDER — ONDANSETRON HCL 4 MG/2ML IJ SOLN: 4.0000 mg | Freq: Once | INTRAMUSCULAR | Status: DC | PRN

## 2016-12-18 MED ORDER — LACTATED RINGERS IV SOLN: INTRAVENOUS | Status: DC

## 2016-12-18 MED ORDER — LIDOCAINE HCL (CARDIAC) 20 MG/ML IV SOLN
INTRAVENOUS | Status: DC | PRN
  Administered 2016-12-18: 80 mg via INTRAVENOUS

## 2016-12-18 MED ORDER — FENTANYL CITRATE (PF) 100 MCG/2ML IJ SOLN
INTRAMUSCULAR | Status: AC
  Filled 2016-12-18: qty 2

## 2016-12-18 MED ORDER — FENTANYL CITRATE (PF) 100 MCG/2ML IJ SOLN
INTRAMUSCULAR | Status: AC
  Administered 2016-12-18: 25 ug via INTRAVENOUS
  Filled 2016-12-18: qty 2

## 2016-12-18 MED ORDER — ACETAMINOPHEN 650 MG RE SUPP: 650.0000 mg | Freq: Four times a day (QID) | RECTAL | Status: DC | PRN

## 2016-12-18 MED ORDER — ACETAMINOPHEN 325 MG PO TABS: 650.0000 mg | ORAL_TABLET | Freq: Four times a day (QID) | ORAL | Status: DC | PRN

## 2016-12-18 MED ORDER — SUGAMMADEX SODIUM 200 MG/2ML IV SOLN
INTRAVENOUS | Status: AC
  Filled 2016-12-18: qty 2

## 2016-12-18 MED ORDER — PANTOPRAZOLE SODIUM 40 MG PO TBEC
40.0000 mg | DELAYED_RELEASE_TABLET | Freq: Every day | ORAL | Status: DC
  Filled 2016-12-18: qty 1

## 2016-12-18 MED ORDER — LIDOCAINE HCL (PF) 1 % IJ SOLN
INTRAMUSCULAR | Status: AC
  Filled 2016-12-18: qty 30

## 2016-12-18 MED ORDER — LIDOCAINE HCL 1 % IJ SOLN
INTRAMUSCULAR | Status: DC | PRN
  Administered 2016-12-18: 10 mL

## 2016-12-18 MED ORDER — SUGAMMADEX SODIUM 200 MG/2ML IV SOLN
INTRAVENOUS | Status: DC | PRN
  Administered 2016-12-18: 150 mg via INTRAVENOUS

## 2016-12-18 MED ORDER — VITAMIN D 1000 UNITS PO TABS
1000.0000 [IU] | ORAL_TABLET | Freq: Every day | ORAL | Status: DC
  Administered 2016-12-18 – 2016-12-19 (×2): 1000 [IU] via ORAL
  Filled 2016-12-18 (×2): qty 1

## 2016-12-18 MED ORDER — CEFAZOLIN SODIUM-DEXTROSE 2-4 GM/100ML-% IV SOLN
2.0000 g | Freq: Once | INTRAVENOUS | Status: AC
  Administered 2016-12-18: 2 g via INTRAVENOUS

## 2016-12-18 MED ORDER — DEXAMETHASONE SODIUM PHOSPHATE 10 MG/ML IJ SOLN
INTRAMUSCULAR | Status: DC | PRN
  Administered 2016-12-18: 5 mg via INTRAVENOUS

## 2016-12-18 MED ORDER — ONDANSETRON HCL 4 MG/2ML IJ SOLN
INTRAMUSCULAR | Status: AC
  Filled 2016-12-18: qty 2

## 2016-12-18 MED ORDER — CEFAZOLIN SODIUM-DEXTROSE 1-4 GM/50ML-% IV SOLN
1.0000 g | Freq: Three times a day (TID) | INTRAVENOUS | Status: AC
  Administered 2016-12-18 – 2016-12-19 (×2): 1 g via INTRAVENOUS
  Filled 2016-12-18 (×3): qty 50

## 2016-12-18 MED ORDER — FENTANYL CITRATE (PF) 100 MCG/2ML IJ SOLN
INTRAMUSCULAR | Status: DC | PRN
  Administered 2016-12-18 (×3): 50 ug via INTRAVENOUS
  Administered 2016-12-18 (×2): 25 ug via INTRAVENOUS

## 2016-12-18 MED ORDER — SODIUM CHLORIDE 0.9% FLUSH: 3.0000 mL | Freq: Two times a day (BID) | INTRAVENOUS | Status: DC

## 2016-12-18 MED ORDER — CEFAZOLIN SODIUM-DEXTROSE 2-4 GM/100ML-% IV SOLN
INTRAVENOUS | Status: AC
  Filled 2016-12-18: qty 100

## 2016-12-18 MED ORDER — DILTIAZEM HCL ER COATED BEADS 180 MG PO CP24
180.0000 mg | ORAL_CAPSULE | Freq: Every day | ORAL | Status: DC
  Administered 2016-12-18 – 2016-12-19 (×2): 180 mg via ORAL
  Filled 2016-12-18 (×2): qty 1

## 2016-12-18 MED ORDER — PROPOFOL 10 MG/ML IV BOLUS
INTRAVENOUS | Status: AC
  Filled 2016-12-18: qty 20

## 2016-12-18 MED ORDER — AMIODARONE HCL 200 MG PO TABS
200.0000 mg | ORAL_TABLET | Freq: Two times a day (BID) | ORAL | Status: DC
  Administered 2016-12-18: 200 mg via ORAL
  Filled 2016-12-18 (×2): qty 1

## 2016-12-18 MED ORDER — EPHEDRINE SULFATE 50 MG/ML IJ SOLN
INTRAMUSCULAR | Status: DC | PRN
  Administered 2016-12-18 (×2): 5 mg via INTRAVENOUS

## 2016-12-18 MED ORDER — PROPOFOL 10 MG/ML IV BOLUS
INTRAVENOUS | Status: DC | PRN
  Administered 2016-12-18: 120 mg via INTRAVENOUS

## 2016-12-18 MED ORDER — HYDROCODONE-ACETAMINOPHEN 5-325 MG PO TABS
1.0000 | ORAL_TABLET | ORAL | Status: DC | PRN
  Administered 2016-12-18 – 2016-12-19 (×3): 1 via ORAL
  Filled 2016-12-18 (×3): qty 1

## 2016-12-18 MED ORDER — DEXTROSE-NACL 5-0.45 % IV SOLN
INTRAVENOUS | Status: DC
  Administered 2016-12-18: 18:00:00 via INTRAVENOUS

## 2016-12-18 MED ORDER — SEVOFLURANE IN SOLN
RESPIRATORY_TRACT | Status: AC
  Filled 2016-12-18: qty 250

## 2016-12-18 MED ORDER — ROCURONIUM BROMIDE 100 MG/10ML IV SOLN
INTRAVENOUS | Status: DC | PRN
  Administered 2016-12-18: 10 mg via INTRAVENOUS
  Administered 2016-12-18: 30 mg via INTRAVENOUS
  Administered 2016-12-18: 10 mg via INTRAVENOUS

## 2016-12-18 MED ORDER — HEPARIN SODIUM (PORCINE) 5000 UNIT/ML IJ SOLN
INTRAMUSCULAR | Status: AC
  Filled 2016-12-18: qty 1

## 2016-12-18 MED ORDER — LACTATED RINGERS IV SOLN
INTRAVENOUS | Status: DC | PRN
  Administered 2016-12-18 (×2): via INTRAVENOUS

## 2016-12-18 MED ORDER — ONDANSETRON HCL 4 MG/2ML IJ SOLN
INTRAMUSCULAR | Status: DC | PRN
  Administered 2016-12-18: 4 mg via INTRAVENOUS

## 2016-12-18 MED ORDER — DEXAMETHASONE SODIUM PHOSPHATE 10 MG/ML IJ SOLN
INTRAMUSCULAR | Status: AC
  Filled 2016-12-18: qty 1

## 2016-12-18 SURGICAL SUPPLY — 54 items
BAG DECANTER FOR FLEXI CONT (MISCELLANEOUS) ×2 IMPLANT
BLADE SURG SZ11 CARB STEEL (BLADE) ×2 IMPLANT
CANISTER SUCT 1200ML W/VALVE (MISCELLANEOUS) ×2 IMPLANT
CHLORAPREP W/TINT 26ML (MISCELLANEOUS) ×2 IMPLANT
COVER LIGHT HANDLE STERIS (MISCELLANEOUS) ×4 IMPLANT
DRAIN CHEST DRY SUCT SGL (MISCELLANEOUS) ×2 IMPLANT
DRAPE C-ARM XRAY 36X54 (DRAPES) ×2 IMPLANT
DRAPE INCISE IOBAN 66X45 STRL (DRAPES) ×2 IMPLANT
DRAPE LAPAROTOMY 77X122 PED (DRAPES) ×2 IMPLANT
DRSG TEGADERM 2-3/8X2-3/4 SM (GAUZE/BANDAGES/DRESSINGS) ×2 IMPLANT
DRSG TEGADERM 4X4.75 (GAUZE/BANDAGES/DRESSINGS) ×2 IMPLANT
DRSG TELFA 4X3 1S NADH ST (GAUZE/BANDAGES/DRESSINGS) ×2 IMPLANT
ELECT CAUTERY BLADE TIP 2.5 (TIP) ×2
ELECT REM PT RETURN 9FT ADLT (ELECTROSURGICAL) ×2
ELECTRODE CAUTERY BLDE TIP 2.5 (TIP) ×1 IMPLANT
ELECTRODE REM PT RTRN 9FT ADLT (ELECTROSURGICAL) ×1 IMPLANT
GLOVE SURG SYN 7.5  E (GLOVE) ×1
GLOVE SURG SYN 7.5 E (GLOVE) ×1 IMPLANT
GOWN STRL REUS W/ TWL LRG LVL3 (GOWN DISPOSABLE) ×2 IMPLANT
GOWN STRL REUS W/TWL LRG LVL3 (GOWN DISPOSABLE) ×2
IV NS 500ML (IV SOLUTION) ×1
IV NS 500ML BAXH (IV SOLUTION) ×1 IMPLANT
KIT PLEURX DRAIN CATH 15.5FR (DRAIN) ×2 IMPLANT
KIT PORT POWER 8FR ISP CVUE (Miscellaneous) ×2 IMPLANT
KIT RM TURNOVER STRD PROC AR (KITS) ×2 IMPLANT
LABEL OR SOLS (LABEL) ×2 IMPLANT
MARKER SKIN DUAL TIP RULER LAB (MISCELLANEOUS) ×6 IMPLANT
NDL SAFETY 22GX1.5 (NEEDLE) ×2 IMPLANT
NEEDLE FILTER BLUNT 18X 1/2SAF (NEEDLE) ×1
NEEDLE FILTER BLUNT 18X1 1/2 (NEEDLE) ×1 IMPLANT
NS IRRIG 500ML POUR BTL (IV SOLUTION) ×2 IMPLANT
PACK BASIN MINOR ARMC (MISCELLANEOUS) ×2 IMPLANT
PACK PORT-A-CATH (MISCELLANEOUS) ×2 IMPLANT
STRIP CLOSURE SKIN 1/2X4 (GAUZE/BANDAGES/DRESSINGS) ×2 IMPLANT
STRIP CLOSURE SKIN 1/4X4 (GAUZE/BANDAGES/DRESSINGS) ×2 IMPLANT
SUCTION FRAZIER HANDLE 10FR (MISCELLANEOUS) ×1
SUCTION TUBE FRAZIER 10FR DISP (MISCELLANEOUS) ×1 IMPLANT
SUT ETHILON 3-0 FS-10 30 BLK (SUTURE) ×2
SUT ETHILON 4-0 (SUTURE) ×2
SUT ETHILON 4-0 FS2 18XMFL BLK (SUTURE) ×2
SUT PROLENE 2 0 SH DA (SUTURE) ×4 IMPLANT
SUT SILK 1 SH (SUTURE) ×2 IMPLANT
SUT VIC AB 0 SH 27 (SUTURE) ×2 IMPLANT
SUT VIC AB 2-0 SH 27 (SUTURE)
SUT VIC AB 2-0 SH 27XBRD (SUTURE) IMPLANT
SUT VIC AB 3-0 SH 27 (SUTURE) ×1
SUT VIC AB 3-0 SH 27X BRD (SUTURE) ×1 IMPLANT
SUTURE EHLN 3-0 FS-10 30 BLK (SUTURE) ×1 IMPLANT
SUTURE ETHLN 4-0 FS2 18XMF BLK (SUTURE) ×2 IMPLANT
SYR 30ML LL (SYRINGE) ×2 IMPLANT
SYR 3ML LL SCALE MARK (SYRINGE) ×2 IMPLANT
SYRINGE 10CC LL (SYRINGE) ×2 IMPLANT
TAPE CLOTH 3X10 WHT NS LF (GAUZE/BANDAGES/DRESSINGS) ×2 IMPLANT
TAPE TRANSPORE STRL 2 31045 (GAUZE/BANDAGES/DRESSINGS) ×2 IMPLANT

## 2016-12-18 NOTE — Anesthesia Preprocedure Evaluation (Signed)
Anesthesia Evaluation  Patient identified by MRN, date of birth, ID band Patient awake    Reviewed: Allergy & Precautions, H&P , NPO status , Patient's Chart, lab work & pertinent test results  History of Anesthesia Complications Negative for: history of anesthetic complications  Airway Mallampati: III  TM Distance: <3 FB Neck ROM: limited    Dental  (+) Poor Dentition, Chipped, Missing   Pulmonary shortness of breath and with exertion, pneumonia, COPD, former smoker,           Cardiovascular Exercise Tolerance: Poor (-) angina+ Past MI and + DOE  + dysrhythmias Atrial Fibrillation      Neuro/Psych negative neurological ROS  negative psych ROS   GI/Hepatic Neg liver ROS, PUD,   Endo/Other  negative endocrine ROS  Renal/GU      Musculoskeletal   Abdominal   Peds  Hematology negative hematology ROS (+)   Anesthesia Other Findings Patient has a dermal drug reaction (erythema) that started earlier this week that was attributed to amiodarone, no airway symptoms, she reports that the erytema has improved since she was started on steroids.  Patient has cardiac clearance for this procedure.   Past Medical History: No date: COPD (chronic obstructive pulmonary disease) (HCC) No date: GU (gastric peptic ulcer) No date: Lung cancer ([place]) No date: Malignant pleural effusion 10/2016: PAF (paroxysmal atrial fibrillation) ([place])     Comment:  a. 10/2016 post thoracentesis ; b. 10/2016 Echo: EF               65-70%, mild LVH;  c. Amio/Eliquis initiated (CHA2DSVASc               = 3). No date: Pneumonia 1993: Wrist fracture     Comment:  bilateral  Past Surgical History: No date: ABDOMINAL HYSTERECTOMY No date: CATARACT EXTRACTION W/ INTRAOCULAR LENS  IMPLANT, BILATERAL No date: CHOLECYSTECTOMY No date: TONSILLECTOMY  BMI    Body Mass Index:  29.87 kg/m      Reproductive/Obstetrics negative OB ROS                              Anesthesia Physical  Anesthesia Plan  ASA: III  Anesthesia Plan: General ETT   Post-op Pain Management:    Induction: Intravenous  PONV Risk Score and Plan: 2 and Ondansetron, Dexamethasone and Treatment may vary due to age or medical condition  Airway Management Planned: Oral ETT  Additional Equipment:   Intra-op Plan:   Post-operative Plan: Extubation in OR  Informed Consent: I have reviewed the patients History and Physical, chart, labs and discussed the procedure including the risks, benefits and alternatives for the proposed anesthesia with the patient or authorized representative who has indicated his/her understanding and acceptance.   Dental Advisory Given  Plan Discussed with: Anesthesiologist, CRNA and Surgeon  Anesthesia Plan Comments: (Patient consented for risks of anesthesia including but not limited to:  - adverse reactions to medications - damage to teeth, lips or other oral mucosa - sore throat or hoarseness - Damage to heart, brain, lungs or loss of life  Patient voiced understanding.)        Anesthesia Quick Evaluation

## 2016-12-18 NOTE — Progress Notes (Signed)
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  INTERVAL HISTORY: Patient returns to clinic today for further evaluation and initiation of cycle 1, day 1 of carboplatinum and Taxol. Her weakness and fatigue have improved. She continues to have a rash, but this is improved as well. She has no neurologic complaints. She has moderate rib pain at the site of the known metastasis. She has a fair appetite, but denies weight loss. She has no chest pain, shortness of breath, cough, or hemoptysis. She denies any nausea, vomiting, constipation, or diarrhea. She has no urinary complaints. Patient offers no further specific complaints today.  REVIEW OF SYSTEMS:   Review of Systems  Constitutional: Positive for malaise/fatigue. Negative for fever and weight loss.  Respiratory: Negative.  Negative for cough and shortness of breath.   Cardiovascular: Negative.  Negative for chest pain and leg swelling.  Gastrointestinal: Negative.  Negative for abdominal pain.  Genitourinary: Negative.   Musculoskeletal: Positive for back pain.  Skin: Positive for itching and rash.  Neurological: Positive for weakness. Negative for sensory change.  Psychiatric/Behavioral: Negative.  The patient is not nervous/anxious.     As per HPI. Otherwise, a complete review of systems is negative.  PAST MEDICAL HISTORY: Past Medical History:  Diagnosis Date  . COPD (chronic obstructive pulmonary disease) ([place])   . GU (gastric peptic ulcer)   . Lung cancer ([place])   . Malignant pleural effusion   . PAF (paroxysmal atrial fibrillation) ([place]) 10/2016   a. 10/2016 post thoracentesis ; b. 10/2016 Echo: EF 65-70%, mild LVH;  c. Amio/Eliquis initiated  (CHA2DSVASc = 3).  . Pneumonia   . Wrist fracture 1993   bilateral    PAST SURGICAL HISTORY: Past Surgical History:  Procedure Laterality Date  . ABDOMINAL HYSTERECTOMY    . CATARACT EXTRACTION W/ INTRAOCULAR LENS  IMPLANT, BILATERAL    . CHEST TUBE INSERTION Left 12/18/2016   Procedure: CHEST TUBE INSERTION;  Surgeon: [name], MD;  Location: [facility] ORS;  Service: General;  Laterality: Left;  . CHOLECYSTECTOMY    . PORTACATH PLACEMENT Left 12/18/2016   Procedure: INSERTION PORT-A-CATH;  Surgeon: [name], MD;  Location: [facility] ORS;  Service: General;  Laterality: Left;  . TONSILLECTOMY      FAMILY HISTORY: Family History  Problem Relation Age of Onset  . Hypertension Father   . Heart disease Father   . Heart attack Father   . Congestive Heart Failure Mother   . Stroke Mother   . Atrial fibrillation Sister     ADVANCED DIRECTIVES (Y/N):  N  HEALTH MAINTENANCE: Social History  Substance Use Topics  . Smoking status: Former Smoker    Packs/day: 1.00    Years: 68.00    Quit date: 11/06/2016  . Smokeless tobacco: Never Used  . Alcohol use No     Colonoscopy:  PAP:  Bone density:  Lipid panel:  Allergies  Allergen Reactions  . Sulfa Antibiotics Other (See Comments)    Seizures   . Levaquin [Levofloxacin] Hives and Itching    Current Outpatient Prescriptions  Medication Sig Dispense Refill  . acetaminophen (TYLENOL) 325 MG tablet Take 650 mg by mouth daily as needed.     Marland Kitchen amiodarone (PACERONE) 200 MG tablet Take 1 tablet (  200 mg total) by mouth 2 (two) times daily. 180 tablet 3  . apixaban (ELIQUIS) 5 MG TABS tablet Take 1 tablet (5 mg total) by mouth 2 (two) times daily. 60 tablet 0  . cetirizine (ZYRTEC) 10 MG tablet Take 10 mg by mouth daily as needed for allergies.    . cholecalciferol (VITAMIN D) 1000 units tablet Take 1,000 Units by mouth daily.    Marland Kitchen diltiazem (CARDIZEM CD) 180 MG 24 hr capsule Take 1 capsule (180 mg total) by mouth daily. 90 capsule  3  . furosemide (LASIX) 20 MG tablet Take 1 tablet by mouth daily as needed. For EDEMA  3  . HYDROcodone-acetaminophen (NORCO/VICODIN) 5-325 MG tablet TAKE 1 TO 2 TABLETS BY MOUTH EVERY 4 TO 6 HOURS AS NEEDED  0  . hydrOXYzine (ATARAX/VISTARIL) 25 MG tablet Take 1 tablet (25 mg total) by mouth every 6 (six) hours as needed. For itching. 120 tablet 0  . methylPREDNISolone (MEDROL DOSEPAK) 4 MG TBPK tablet Take as directed 21 tablet 0  . omeprazole (PRILOSEC) 20 MG capsule Take 20 mg by mouth daily.    . pantoprazole (PROTONIX) 40 MG tablet     . potassium chloride SA (K-DUR,KLOR-CON) 20 MEQ tablet Take 2 tablets (40 mEq total) by mouth 2 (two) times daily. 20 tablet 0  . lidocaine-prilocaine (EMLA) cream Apply to affected area once (Patient not taking: Reported on 12/18/2016) 30 g 3  . nicotine (NICODERM CQ - DOSED IN MG/24 HOURS) 21 mg/24hr patch Place 1 patch (21 mg total) onto the skin daily. (Patient not taking: Reported on 12/13/2016) 28 patch 0  . ondansetron (ZOFRAN) 8 MG tablet Take 1 tablet (8 mg total) by mouth 2 (two) times daily as needed for refractory nausea / vomiting. (Patient not taking: Reported on 12/18/2016) 60 tablet 2  . prochlorperazine (COMPAZINE) 10 MG tablet Take 1 tablet (10 mg total) by mouth every 6 (six) hours as needed (Nausea or vomiting). (Patient not taking: Reported on 12/18/2016) 60 tablet 2   No current facility-administered medications for this visit.     OBJECTIVE: Vitals:   12/21/16 0922  BP: 135/69  Pulse: 75  Resp: 18  Temp: 98.6 F (37 C)  SpO2: 96%     Body mass index is 29.62 kg/m.    ECOG FS:1 - Symptomatic but completely ambulatory  General: Well-developed, well-nourished, no acute distress. Eyes: Pink conjunctiva, anicteric sclera. Lungs: Clear to auscultation bilaterally. Heart: Regular rate and rhythm. No rubs, murmurs, or gallops. Abdomen: Soft, nontender, nondistended. No organomegaly noted, normoactive bowel sounds. Musculoskeletal: No  edema, cyanosis, or clubbing. Neuro: Alert, answering all questions appropriately. Cranial nerves grossly intact. Skin: Maculopapular rash noted, Improved. Psych: Normal affect.    LAB RESULTS:  Lab Results  Component Value Date   NA 135 12/21/2016   K 3.8 12/21/2016   CL 99 (L) 12/21/2016   CO2 29 12/21/2016   GLUCOSE 122 (H) 12/21/2016   BUN 22 (H) 12/21/2016   CREATININE 0.86 12/21/2016   CALCIUM 8.6 (L) 12/21/2016   PROT 5.6 (L) 12/21/2016   ALBUMIN 2.9 (L) 12/21/2016   AST 21 12/21/2016   ALT 13 (L) 12/21/2016   ALKPHOS 88 12/21/2016   BILITOT 0.6 12/21/2016   GFRNONAA >60 12/21/2016   GFRAA >60 12/21/2016    Lab Results  Component Value Date   WBC 15.4 (H) 12/21/2016   NEUTROABS 10.5 (H) 12/21/2016   HGB 12.0 12/21/2016   HCT 35.1 12/21/2016   MCV 81.5 12/21/2016  PLT 228 12/21/2016     STUDIES: Dg Chest 1 View  Result Date: 12/14/2016 CLINICAL DATA:  Left thoracentesis . EXAM: CHEST 1 VIEW COMPARISON:  12/08/2016. FINDINGS: Interim left left thoracentesis. Removal of significant amount of left pleural fluid with minimal residual effusion. No pneumothorax. Cardiomegaly again noted. Mild bilateral interstitial prominence. Mild component CHF may be present. Pneumonitis cannot be excluded . IMPRESSION: 1. Left thoracentesis with removal of significant amount left pleural fluid. No evidence of pneumothorax . 2. Cardiomegaly with mild bilateral interstitial prominence. Mild component of CHF view present. Mild pneumonitis cannot be excluded . Electronically Signed   By: Marcello Moores  Register   On: 12/14/2016 14:36   Dg Chest 2 View  Result Date: 12/08/2016 CLINICAL DATA:  Shortness of breath. EXAM: CHEST  2 VIEW COMPARISON:  Radiograph of November 12, 2016. FINDINGS: Stable cardiomegaly. Atherosclerosis of thoracic aorta is noted. No pneumothorax is noted. Large left pleural effusion is noted with probable underlying atelectasis or infiltrate. Minimal right basilar  subsegmental atelectasis. Bony thorax is unremarkable. IMPRESSION: Aortic atherosclerosis. Large left pleural effusion is stable with probable underlying atelectasis or infiltrate. Electronically Signed   By: Marijo Conception, M.D.   On: 12/08/2016 13:39   Nm Pet Image Initial (pi) Skull Base To Thigh  Result Date: 11/29/2016 CLINICAL DATA:  Initial treatment strategy for left lung mass. EXAM: NUCLEAR MEDICINE PET SKULL BASE TO THIGH TECHNIQUE: 12.3 mCi F-18 FDG was injected intravenously. Full-ring PET imaging was performed from the skull base to thigh after the radiotracer. CT data was obtained and used for attenuation correction and anatomic localization. FASTING BLOOD GLUCOSE:  Value: 104 mg/dl COMPARISON:  CT chest 11/13/2016. FINDINGS: NECK: No hypermetabolic lymph nodes in the neck. CHEST: Multiple hypermetabolic parenchymal and subpleural nodules are identified in the left lung. 2.6 cm left posterolateral nodule, behind the aorta demonstrates SUV max = 7.9. Hypermetabolism in the left hilum demonstrates SUV max = 10.4. 2 cm soft tissue nodule between the left inferior pulmonary vein and aorta demonstrates SUV max = 15.5. Left lower lobe cavitary lesion seen on the prior study is in a different location due to pleural fluid and atelectasis with SUV max = 9.7. 3.6 cm posterior left chest wall lesion with associated destruction of the posterior tenth rib demonstrates SUV max = 12.3. An area of subpleural airspace disease in the inferior right lung base (image 120 series 4) is hypermetabolic with SUV max = 5.5. ABDOMEN/PELVIS: No abnormal hypermetabolic activity within the liver, pancreas, adrenal glands, or spleen. No hypermetabolic lymph nodes in the abdomen or pelvis. Tiny low-density lesion lateral segment left liver on CT images is stable since prior CT scan and likely a cyst. Small bilateral adrenal nodules have average attenuation of less than 10 Hounsfield units and show no hypermetabolism on PET  imaging, features most consistent with benign adrenal adenomas. There is abdominal aortic atherosclerosis without aneurysm. Stable appearance left renal cyst since abdomen and pelvis CT of 09/21/2009. SKELETON: No focal hypermetabolic activity to suggest skeletal metastasis. IMPRESSION: 1. Multiple hypermetabolic lung and pleural-based lesions in the left hemithorax. There is hypermetabolic probable lymph node adjacent to the lower thoracic aorta and a hypermetabolic left chest wall at the level of the tenth rib. 2. Subtle area of subpleural airspace opacity in the right lung base is hypermetabolic. This may be infectious/inflammatory although metastatic disease not excluded. 3. No definite metastatic disease in the abdomen or pelvis. 4. Bilateral adrenal adenomas. 5.  Aortic Atherosclerois (ICD10-170.0) Electronically Signed  By: Misty Stanley M.D.   On: 11/29/2016 15:30   Ct Biopsy  Result Date: 12/06/2016 INDICATION: Concern for metastatic lung cancer. Please perform CT-guided biopsy for tissue diagnostic purposes. EXAM: CT-GUIDED BIOPSY OF HYPERMETABOLIC LEFT LATERAL CHEST WALL MASS. COMPARISON:  PET-CT - 11/29/2016; chest CT - 11/13/2016 MEDICATIONS: None. ANESTHESIA/SEDATION: Fentanyl 37.5 mcg IV; Versed 1 mg IV Sedation time: 13 minutes; The patient was continuously monitored during the procedure by the interventional radiology nurse under my direct supervision. CONTRAST:  None COMPLICATIONS: None immediate. PROCEDURE: Informed consent was obtained from the patient following an explanation of the procedure, risks, benefits and alternatives. The patient understands,agrees and consents for the procedure. All questions were addressed. A time out was performed prior to the initiation of the procedure. The patient was positioned supine, slightly RPO on the CT table and a limited chest CT was performed for procedural planning demonstrating unchanged size and appearance of the hypermetabolic mass involving the  lateral aspect of the left tenth rib measuring approximately 3.2 x 2.5 cm (image 40, series 3). The operative site was prepped and draped in the usual sterile fashion. Under sterile conditions and local anesthesia, a 17 gauge coaxial needle was advanced into the peripheral aspect of the nodule. Positioning was confirmed with intermittent CT fluoroscopy and followed by the acquisition of 5 core needle biopsies with an 18 gauge core needle biopsy device. The coaxial needle was removed and superficial hemostasis was achieved with manual compression. Limited post procedural chest CT was negative for pneumothorax or additional complication. A dressing was placed. The patient tolerated the procedure well without immediate postprocedural complication. The patient was escorted to have an upright chest radiograph. IMPRESSION: Technically successful CT guided core needle core biopsy of indeterminate hypermetabolic mass involving the lateral aspect of the left tenth rib. Electronically Signed   By: Sandi Mariscal M.D.   On: 12/06/2016 13:41   Dg Chest Port 1 View  Result Date: 12/18/2016 CLINICAL DATA:  Chest tube placement EXAM: PORTABLE CHEST 1 VIEW COMPARISON:  12/14/2016, 12/08/2016, PET-CT 11/29/2016 FINDINGS: Placement of a left-sided central venous port with the tip projecting over the SVC confluence. Interim insertion of left lower chest tube with poorly visible tip, possibly coiled at the left CP angle. Decreased left pleural effusion or thickening. Patchy atelectasis or infiltrate at the left base. Stable cardiomediastinal silhouette. Questionable tiny left apical pneumothorax. IMPRESSION: 1. Insertion of left lower chest drainage tube with poorly visible tip, it is possibly coiled at the left CP angle 2. Decreased left pleural effusion or thickening. Possible tiny left apical pneumothorax. 3. Patchy atelectasis or infiltrate at the left lung base 4. Mild cardiomegaly Electronically Signed   By: Donavan Foil M.D.    On: 12/18/2016 15:34   Dg C-arm 1-60 Min-no Report  Result Date: 12/18/2016 Fluoroscopy was utilized by the requesting physician.  No radiographic interpretation.   US Thoracentesis Asp Pleural Space W/img Guide  Result Date: 12/14/2016 INDICATION: Malignant left pleural effusion. EXAM: ULTRASOUND GUIDED LEFT THORACENTESIS MEDICATIONS: None. COMPLICATIONS: None immediate. PROCEDURE: An ultrasound guided thoracentesis was thoroughly discussed with the patient and questions answered. The benefits, risks, alternatives and complications were also discussed. The patient understands and wishes to proceed with the procedure. Written consent was obtained. Ultrasound was performed to localize and mark an adequate pocket of fluid in the left chest. The area was then prepped and draped in the normal sterile fashion. 1% Lidocaine was used for local anesthesia. Under ultrasound guidance a 6 Fr Safe-T-Centesis catheter was  introduced. Thoracentesis was performed. The catheter was removed and a dressing applied. FINDINGS: A total of approximately 1.3 L of amber colored fluid was removed. IMPRESSION: Successful ultrasound guided left thoracentesis yielding 1.3 L of pleural fluid. Electronically Signed   By: Markus Daft M.D.   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 Patient was scheduled for an MRI the brain, but canceled this appointment. This will need to be rescheduled in the near future to complete the staging workup. Patient has agreed to palliative chemotherapy with carboplatinum and Taxol, but given her advanced age will dose reduce carboplatinum and give weekly Taxol. Patient will receive carboplatinum AUC 5 on day 1 and Taxol 80 mg/m on days 1, 8, and 15. This will be a 28 day cycle. Patient has indicated that if her quality of life is effected  negatively or side effects are intolerable, she would likely discontinue treatment. Her PDL-1 level is 90%, therefore immunotherapy may be of benefit in the future. Hospice was previsouly discussed, but the patient is not ready for this at this time.  Return to clinic in 1 week for consideration of receive cycle 1, day 8, Taxol only. 2. Rib pain: Patient has had consultation with radiation oncology and will receive XRT next week. 3. Rash: Possibly secondary to Levaquin. Monitor.  Approximately 30 minutes was spent in discussion of which greater than 50% was consultation.  Patient expressed understanding and was in agreement with this plan. She also understands that She can call clinic at any time with any questions, concerns, or complaints.   Cancer Staging Squamous cell carcinoma lung, left ([place]) Staging form: Lung, AJCC 8th Edition - Clinical stage from 12/13/2016: Stage IVA (cT4, cN0, cM1a) - Signed by [name], MD on 12/13/2016   [name], MD   12/22/2016 10:06 AM

## 2016-12-18 NOTE — Op Note (Signed)
  12/18/2016  3:13 PM  PATIENT:  [name]  83 y.o. female  PRE-OPERATIVE DIAGNOSIS:  Malignant left-sided pleural effusion  POST-OPERATIVE DIAGNOSIS:  Same  PROCEDURE:  #1 insertion of left-sided Pleurx catheter #2 insertion of left subclavian vein Port-A-Cath  SURGEON:  Surgeon(s) and Role:    [name], MD - Primary  ASSISTANTS: Cecile Hearing PA student  ANESTHESIA: Gen.  INDICATIONS FOR PROCEDURE this is an 83 year old woman with a recent diagnosis of a malignant left-sided pleural effusion. She was found to be a good candidate for chemotherapy and she was offered the above-named procedure for management of her recurrent malignant pleural effusion and for intravenous access for chemotherapy.  DICTATION: Patient was seen in the preoperative holding room she was appropriately marked. She was taken to the operating room and was given general endotracheal anesthesia. The patient was turned for a left Pleurx catheter insertion. The patient was prepped and draped in usual sterile fashion. A small skin incision was made in the sixth intercostal space at about the anterior axillary line. The incision was deepened down through the muscles until the pleural space was visualized. A Pleurx catheter is inserted through a separate stab wound brought up into the field. It was then tunneled and placed into the chest. It was secured with #1 silk. The wounds were closed with layers of running absorbable suture. The skin was closed with nylon. Sterile dressings were applied. There is approximately 400 cc of serosanguineous fluid present within the pleural space.  The patient was then turned in the supine position where she was prepped and draped in usual sterile fashion for insertion of a left subclavian Port-A-Cath. Left subclavian vein was percutaneously catheterized. A wire was placed under fluoroscopic guidance into the superior vena cava. A port site was selected on the anterior chest wall was  created. The catheter was then tunneled from the port site up to the subclavian vein site and it was then placed in the venous system under fluoroscopic guidance. The catheter was trimmed and assembled. There is excellent flushing and aspiration of the catheter. The catheter was filled with heparinized saline and it was then secured in the pocket with interrupted 4-0 Prolene sutures. The catheter again was flushed him under fluoroscopic guidance was found to be in good position. The left-sided Pleurx catheter was also in good position. The wounds were closed with absorbable sutures. The skin was closed with nylon. Sterile dressings were applied.  The patient was then awakened from general endotracheal anesthesia and taken to the recovery room in stable condition.   [name], MD

## 2016-12-18 NOTE — Telephone Encounter (Signed)
If levaquin was identified to be the offending agent, than I'd favor her resuming her amiodarone.

## 2016-12-18 NOTE — Transfer of Care (Signed)
Immediate Anesthesia Transfer of Care Note  Patient: [name]  Procedure(s) Performed: CHEST TUBE INSERTION (Left ) INSERTION PORT-A-CATH (Left )  Patient Location: PACU  Anesthesia Type:General  Level of Consciousness: awake, alert  and oriented  Airway & Oxygen Therapy: Patient Spontanous Breathing and Patient connected to face mask oxygen  Post-op Assessment: Report given to RN and Post -op Vital signs reviewed and stable  Post vital signs: Reviewed and stable  Last Vitals:  Vitals:   12/18/16 1047 12/18/16 1450  BP: (!) 141/90 139/72  Pulse: 83 85  Resp: 19 20  Temp: (!) 36.1 C (!) 35.9 C  SpO2: 97% 100%    Last Pain:  Vitals:   12/18/16 1047  TempSrc: Oral         Complications: No apparent anesthesia complications

## 2016-12-18 NOTE — Anesthesia Procedure Notes (Signed)
Procedure Name: Intubation Date/Time: 12/18/2016 12:22 PM Performed by: Hedda Slade Pre-anesthesia Checklist: Patient identified, Patient being monitored, Timeout performed, Emergency Drugs available and Suction available Patient Re-evaluated:Patient Re-evaluated prior to induction Oxygen Delivery Method: Circle system utilized Preoxygenation: Pre-oxygenation with 100% oxygen Induction Type: IV induction Ventilation: Mask ventilation without difficulty Laryngoscope Size: Mac and 3 Grade View: Grade I Tube type: Oral Tube size: 7.0 mm Number of attempts: 1 Airway Equipment and Method: Stylet Placement Confirmation: ETT inserted through vocal cords under direct vision,  positive ETCO2 and breath sounds checked- equal and bilateral Secured at: 21 cm Tube secured with: Tape Dental Injury: Teeth and Oropharynx as per pre-operative assessment

## 2016-12-18 NOTE — Interval H&P Note (Signed)
History and Physical Interval Note:  12/18/2016 11:25 AM  [name]  has presented today for surgery, with the diagnosis of primary malignant neoplasm of lung,pleural effusion  The various methods of treatment have been discussed with the patient and family. After consideration of risks, benefits and other options for treatment, the patient has consented to  Procedure(s): CHEST TUBE INSERTION (N/A) INSERTION PORT-A-CATH (N/A) as a surgical intervention .  The patient's history has been reviewed, patient examined, no change in status, stable for surgery.  I have reviewed the patient's chart and labs.  Questions were answered to the patient's satisfaction.     [name]

## 2016-12-18 NOTE — H&P (View-Only) (Signed)
Patient ID: [name], female   DOB: [birth year], 83 y.o.   MRN: [id number]  Chief Complaint  Patient presents with  . New Patient (Initial Visit)    Pleural Effusion    Referred By Dr. Leonidas Romberg Reason for Referral Recurrent left-sided pleural effusion  HPI Location, Quality, Duration, Severity, Timing, Context, Modifying Factors, Associated Signs and Symptoms.  [name] is a 83 y.o. female.  About one month ago she noticed increasing shortness of breath and was found to have a large left-sided pleural effusion. She did have a thoracentesis done and developed an episode of atrial fibrillation postop. She was in the hospital and was seen by Dr. Delight Hoh and Dr. Tanda Rockers.  The fluid cytology was negative and she had a PET scan done. That revealed a mass in the lateral chest wall as well as several mediastinal lymph nodes. She underwent a percutaneous biopsy revealing metastatic squamous cell carcinoma. She has had a repeat chest x-ray made on Friday of last week showing a recurrent large left-sided pleural effusion. She states that she has been getting more short of breath. She also developed a rash over the last few days. She does not complain of any significant pain at the biopsy site. Of note is that she had her shortness of breath relieved with the last thoracentesis. She stopped taking her eloquence over the weekend in preparation of some type of surgical intervention.   Past Medical History:  Diagnosis Date  . COPD (chronic obstructive pulmonary disease) ([place])   . GU (gastric peptic ulcer)     Past Surgical History:  Procedure Laterality Date  . ABDOMINAL HYSTERECTOMY    . CHOLECYSTECTOMY      Family History  Problem Relation Age of Onset  . Hypertension Father   . Heart disease Father   . Congestive Heart Failure Mother   . Atrial fibrillation Sister     Social History Social History  Substance Use Topics  . Smoking status: Former Smoker    Packs/day:  1.00    Years: 68.00    Quit date: 11/06/2016  . Smokeless tobacco: Never Used  . Alcohol use No    Allergies  Allergen Reactions  . Sulfa Antibiotics Other (See Comments)    Seizures     Current Outpatient Prescriptions  Medication Sig Dispense Refill  . acetaminophen (TYLENOL) 325 MG tablet Take 650 mg by mouth once.    Marland Kitchen amiodarone (PACERONE) 200 MG tablet Take 1 tablet (200 mg total) by mouth 2 (two) times daily. 60 tablet 0  . cetirizine (ZYRTEC) 10 MG tablet Take 10 mg by mouth daily as needed for allergies.    . cholecalciferol (VITAMIN D) 1000 units tablet Take 1,000 Units by mouth daily.    Marland Kitchen diltiazem (CARDIZEM CD) 180 MG 24 hr capsule Take 1 capsule (180 mg total) by mouth daily. 30 capsule 0  . furosemide (LASIX) 20 MG tablet Take 1 tablet by mouth daily as needed. For EDEMA  3  . levofloxacin (LEVAQUIN) 250 MG tablet Take 1 tablet (250 mg total) by mouth daily. 7 tablet 0  . nicotine (NICODERM CQ - DOSED IN MG/24 HOURS) 21 mg/24hr patch Place 1 patch (21 mg total) onto the skin daily. 28 patch 0  . omeprazole (PRILOSEC) 20 MG capsule Take 20 mg by mouth daily.    Marland Kitchen apixaban (ELIQUIS) 5 MG TABS tablet Take 1 tablet (5 mg total) by mouth 2 (two) times daily. (Patient not taking: Reported on 12/11/2016) 60  tablet 0   No current facility-administered medications for this visit.       Review of Systems A complete review of systems was asked and was negative except for the following positive findingsAtrial fibrillation, swelling of her lower extremities, shortness of breath, wheezing, heartburn, easy bruising.  Blood pressure 123/77, pulse 80, temperature 98.7 F (37.1 C), temperature source Oral, resp. rate (!) 24, height 5\' 3"  (1.6 m), weight 170 lb 9.6 oz (77.4 kg), SpO2 95 %.  Physical Exam CONSTITUTIONAL:  Pleasant, well-developed, well-nourished, and in no acute distress. EYES: Pupils equal and reactive to light, Sclera non-icteric EARS, NOSE, MOUTH AND THROAT:   The oropharynx was clear.  Dentition is good repair.  Oral mucosa pink and moist. LYMPH NODES:  Lymph nodes in the neck and axillae were normal RESPIRATORY:  Lungs were clear on the right but decreased on the left.  Normal respiratory effort without pathologic use of accessory muscles of respiration CARDIOVASCULAR: Heart was regular without murmurs.  There were no carotid bruits. GI: The abdomen was soft, nontender, and nondistended. There were no palpable masses. There was no hepatosplenomegaly. There were normal bowel sounds in all quadrants. GU:  Rectal deferred.   MUSCULOSKELETAL:  Normal muscle strength and tone.  No clubbing or cyanosis.   SKIN:  There were no pathologic skin lesions.  There were no nodules on palpation. NEUROLOGIC:  Sensation is normal.  Cranial nerves are grossly intact. PSYCH:  Oriented to person, place and time.  Mood and affect are normal.  Data Reviewed CXR and CT/PET  I have personally reviewed the patient's imaging, laboratory findings and medical records.    Assessment    Recurrent left sided pleural effusion.    Plan    I had a long discussion today with Dr. Leonidas Romberg and with Dr. Delight Hoh. He is now aware of the diagnosis of metastatic squamous cell carcinoma. He does believe that she would be a good candidate for chemotherapy. Therefore I discussed in detail with her and her daughter the indications and risks of Pleurx catheter insertion as well as Port-A-Cath insertion. Risks of bleeding, infection, pneumothorax and death were all reviewed. She appears to have a very supportive family who are active in her care and she should have a good result. We also discussed the possibility of removing the catheters at a later date. All questions were answered.  She will need clearance by Dr. Candis Musa prior to surgery.       [name], MD 12/11/2016, 11:10 AM

## 2016-12-18 NOTE — Anesthesia Post-op Follow-up Note (Signed)
Anesthesia QCDR form completed.        

## 2016-12-19 ENCOUNTER — Ambulatory Visit: Payer: Medicare HMO

## 2016-12-19 ENCOUNTER — Encounter: Payer: Self-pay | Admitting: Cardiothoracic Surgery

## 2016-12-19 DIAGNOSIS — C3490 Malignant neoplasm of unspecified part of unspecified bronchus or lung: Secondary | ICD-10-CM | POA: Diagnosis not present

## 2016-12-19 LAB — SURGICAL PATHOLOGY

## 2016-12-19 MED ORDER — AMIODARONE HCL 200 MG PO TABS: 200.0000 mg | ORAL_TABLET | Freq: Two times a day (BID) | ORAL | 3 refills | Status: DC

## 2016-12-19 NOTE — Care Management Obs Status (Signed)
[facility] NOTIFICATION   Patient Details  Name: [name] MRN: [id number] Date of Birth: 1932-11-01   Medicare Observation Status Notification Given:  No (admitted obs less than 24 hours)    Beverly Sessions, RN 12/19/2016, 1:32 PM

## 2016-12-19 NOTE — Telephone Encounter (Signed)
Patient currently admitted in the hospital. S/w patient's granddaughter and she verbalized understanding to resume Amiodarone. New Rx sent into patient's pharmacy.

## 2016-12-19 NOTE — Care Management (Signed)
Patient discharged home today with Pleurx cath in placed.  Family stated that [place] services were already set up with [facility] and the nurse was scheduled to come out.  RNCM confirmed with Sarah from [facility] that the patient has active orders, and they would be arranging for supplies to be delivered to the home.  Patient discharged with 4 pleurx kits.  RNCM signing off.

## 2016-12-19 NOTE — Anesthesia Postprocedure Evaluation (Addendum)
Anesthesia Post Note  Patient: [name]  Procedure(s) Performed: CHEST TUBE INSERTION (Left ) INSERTION PORT-A-CATH (Left )  Patient location during evaluation: PACU Anesthesia Type: General Level of consciousness: awake and alert Pain management: pain level controlled Vital Signs Assessment: post-procedure vital signs reviewed and stable Respiratory status: spontaneous breathing, nonlabored ventilation, respiratory function stable and patient connected to nasal cannula oxygen Cardiovascular status: blood pressure returned to baseline and stable Postop Assessment: no apparent nausea or vomiting Anesthetic complications: no     Last Vitals:  Vitals:   12/18/16 2110 12/19/16 0518  BP: (!) 125/56 (!) 125/49  Pulse: 75 70  Resp:  18  Temp:  36.7 C  SpO2:  96%    Last Pain:  Vitals:   12/19/16 0527  TempSrc:   PainSc: 0-No pain                 Martha Clan

## 2016-12-19 NOTE — Progress Notes (Signed)
Did well overnight.  No pain.  CT without air leak.  Draining serous fluid.  Wounds clean and dry.  Changed dressing and instructed patient's daughter on how to do drainage and wound care.  Will come back to our office later this week and next week for suture removal.

## 2016-12-19 NOTE — Discharge Summary (Signed)
Physician Discharge Summary  Patient ID: [place] Inthavong Sou MRN: [id number] DOB/AGE: [birth year] 83 y.o.  Admit date: 12/18/2016 Discharge date: 12/19/2016   Discharge Diagnoses:  Active Problems:   Lung cancer [facility])   Procedures: Insertion of PleurX and port a cath  Hospital Course: Admitted after the above for 24 hour observation.  Did well without incident.  Instructed family on PleurX catheter management.    Disposition: 01-Home or Self Care  Discharge Instructions    Diet - low sodium heart healthy    Complete by:  As directed    Discharge wound care:    Complete by:  As directed    Home health to drain catheter 3 X per week.  Contact our office to arrange for followup.  Leave dressing on over the port until seen for chemo on Thursday.  Remove all dressings associated with the PleurX catheter when home health comes to take care of PleurX   Increase activity slowly    Complete by:  As directed      Allergies as of 12/19/2016      Reactions   Sulfa Antibiotics Other (See Comments)   Seizures   Levaquin [levofloxacin] Hives, Itching      Medication List    TAKE these medications   acetaminophen 325 MG tablet Commonly known as:  TYLENOL Take 650 mg by mouth daily as needed.   amiodarone 200 MG tablet Commonly known as:  PACERONE Take 1 tablet (200 mg total) by mouth 2 (two) times daily.   apixaban 5 MG Tabs tablet Commonly known as:  ELIQUIS Take 1 tablet (5 mg total) by mouth 2 (two) times daily.   cetirizine 10 MG tablet Commonly known as:  ZYRTEC Take 10 mg by mouth daily as needed for allergies.   cholecalciferol 1000 units tablet Commonly known as:  VITAMIN D Take 1,000 Units by mouth daily.   diltiazem 180 MG 24 hr capsule Commonly known as:  CARDIZEM CD Take 1 capsule (180 mg total) by mouth daily.   furosemide 20 MG tablet Commonly known as:  LASIX Take 1 tablet by mouth daily as needed. For EDEMA   hydrOXYzine 25 MG tablet Commonly known as:   ATARAX/VISTARIL Take 1 tablet (25 mg total) by mouth every 6 (six) hours as needed. For itching.   lidocaine-prilocaine cream Commonly known as:  EMLA Apply to affected area once   methylPREDNISolone 4 MG Tbpk tablet Commonly known as:  MEDROL DOSEPAK Take as directed   nicotine 21 mg/24hr patch Commonly known as:  NICODERM CQ - dosed in mg/24 hours Place 1 patch (21 mg total) onto the skin daily.   omeprazole 20 MG capsule Commonly known as:  PRILOSEC Take 20 mg by mouth daily.   ondansetron 8 MG tablet Commonly known as:  ZOFRAN Take 1 tablet (8 mg total) by mouth 2 (two) times daily as needed for refractory nausea / vomiting.   potassium chloride SA 20 MEQ tablet Commonly known as:  K-DUR,KLOR-CON Take 2 tablets (40 mEq total) by mouth 2 (two) times daily.   prochlorperazine 10 MG tablet Commonly known as:  COMPAZINE Take 1 tablet (10 mg total) by mouth every 6 (six) hours as needed (Nausea or vomiting).            Discharge Care Instructions        Start     Ordered   12/19/16 0000  Discharge wound care:    Comments:  Home health to drain catheter 3 X per week.  Contact our office to arrange for followup.  Leave dressing on over the port until seen for chemo on Thursday.  Remove all dressings associated with the PleurX catheter when home health comes to take care of PleurX   12/19/16 [place], MD

## 2016-12-19 NOTE — Patient Instructions (Signed)

## 2016-12-19 NOTE — Telephone Encounter (Signed)
Spoke with Joelene Millin from [facility] at this time. Home Health has opened patient's case and is awaiting patient's discharge from hospital to set up home evaluation. She will contact me with any further needs.

## 2016-12-20 ENCOUNTER — Encounter: Payer: Self-pay | Admitting: Oncology

## 2016-12-20 ENCOUNTER — Ambulatory Visit
Admission: RE | Admit: 2016-12-20 | Discharge: 2016-12-20 | Disposition: A | Discharge status: Home / Self Care | Payer: Medicare HMO | Source: Ambulatory Visit | Attending: Radiation Oncology | Admitting: Radiation Oncology

## 2016-12-20 ENCOUNTER — Telehealth: Payer: Self-pay

## 2016-12-20 ENCOUNTER — Encounter: Payer: Self-pay | Admitting: Radiation Oncology

## 2016-12-20 ENCOUNTER — Inpatient Hospital Stay: Payer: Medicare HMO | Attending: Oncology

## 2016-12-20 VITALS — BP 125/67 | HR 65 | Temp 98.0°F | Wt 168.1 lb

## 2016-12-20 DIAGNOSIS — J90 Pleural effusion, not elsewhere classified: Secondary | ICD-10-CM | POA: Insufficient documentation

## 2016-12-20 DIAGNOSIS — I4891 Unspecified atrial fibrillation: Secondary | ICD-10-CM | POA: Insufficient documentation

## 2016-12-20 DIAGNOSIS — Z8701 Personal history of pneumonia (recurrent): Secondary | ICD-10-CM | POA: Diagnosis not present

## 2016-12-20 DIAGNOSIS — I509 Heart failure, unspecified: Secondary | ICD-10-CM | POA: Insufficient documentation

## 2016-12-20 DIAGNOSIS — J449 Chronic obstructive pulmonary disease, unspecified: Secondary | ICD-10-CM | POA: Insufficient documentation

## 2016-12-20 DIAGNOSIS — C7951 Secondary malignant neoplasm of bone: Secondary | ICD-10-CM | POA: Insufficient documentation

## 2016-12-20 DIAGNOSIS — Z87891 Personal history of nicotine dependence: Secondary | ICD-10-CM | POA: Insufficient documentation

## 2016-12-20 DIAGNOSIS — Z7902 Long term (current) use of antithrombotics/antiplatelets: Secondary | ICD-10-CM | POA: Insufficient documentation

## 2016-12-20 DIAGNOSIS — Z510 Encounter for antineoplastic radiation therapy: Secondary | ICD-10-CM | POA: Diagnosis not present

## 2016-12-20 DIAGNOSIS — Z79899 Other long term (current) drug therapy: Secondary | ICD-10-CM | POA: Insufficient documentation

## 2016-12-20 DIAGNOSIS — Z8781 Personal history of (healed) traumatic fracture: Secondary | ICD-10-CM | POA: Insufficient documentation

## 2016-12-20 DIAGNOSIS — C3492 Malignant neoplasm of unspecified part of left bronchus or lung: Secondary | ICD-10-CM | POA: Insufficient documentation

## 2016-12-20 DIAGNOSIS — R21 Rash and other nonspecific skin eruption: Secondary | ICD-10-CM | POA: Insufficient documentation

## 2016-12-20 DIAGNOSIS — R531 Weakness: Secondary | ICD-10-CM | POA: Insufficient documentation

## 2016-12-20 DIAGNOSIS — Z7982 Long term (current) use of aspirin: Secondary | ICD-10-CM | POA: Insufficient documentation

## 2016-12-20 DIAGNOSIS — Z5111 Encounter for antineoplastic chemotherapy: Secondary | ICD-10-CM | POA: Insufficient documentation

## 2016-12-20 DIAGNOSIS — Z8711 Personal history of peptic ulcer disease: Secondary | ICD-10-CM | POA: Diagnosis not present

## 2016-12-20 DIAGNOSIS — E871 Hypo-osmolality and hyponatremia: Secondary | ICD-10-CM | POA: Insufficient documentation

## 2016-12-20 DIAGNOSIS — R5383 Other fatigue: Secondary | ICD-10-CM | POA: Insufficient documentation

## 2016-12-20 DIAGNOSIS — I700 Atherosclerosis of aorta: Secondary | ICD-10-CM | POA: Insufficient documentation

## 2016-12-20 NOTE — Telephone Encounter (Signed)
Called patient and had to leave her a voicemail to return my phone call.

## 2016-12-20 NOTE — Consult Note (Signed)
NEW PATIENT EVALUATION  Name: [name]  MRN: [id number]  Date:   12/20/2016     DOB: 06/06/1932   This 83 y.o. female patient presents to the clinic for initial evaluation of left rib involvement of stage IV lung cancer.  REFERRING PHYSICIAN: Albina Billet, MD  CHIEF COMPLAINT:  Chief Complaint  Patient presents with  . Cancer    Bone mets to the Rib    DIAGNOSIS: The encounter diagnosis was Bone metastasis ([place]).   PREVIOUS INVESTIGATIONS:  PET CT and CT scans reviewed Pathology report reviewed Clinical notes reviewed Case presented at weekly tumor conference  HPI: patient is a 83 year old female who presented over the summer with increasing shortness of breath found have a large left pleural effusion. She went on to have thoracentesis and developed an episode of atrial fibrillation and was treated for that. Fluid cytology was negative. PET CT scan demonstrated a mass in the lateral chest wall with hypermetabolic me to spinal lymph nodes. Percutaneous biopsy showed squamous cell carcinoma. She was also noted on PET CT scanto have multiple hypermetabolic lung and pleural Place lesions in the left hemithorax. She also hypermetabolic activity in the left chest wall at the level of the left 10th rib. This area has been having increasing pain and discomfort.she has been started on palliative chemotherapy with carboplatinum and Taxol. She is tolerating that well. She is seen today for consideration of palliative treatment to her left rib.  PLANNED TREATMENT REGIMEN: hypofractionated radiation therapy to her left rib  PAST MEDICAL HISTORY:  has a past medical history of COPD (chronic obstructive pulmonary disease) ([place]); GU (gastric peptic ulcer); Lung cancer ([place]); Malignant pleural effusion; PAF (paroxysmal atrial fibrillation) ([place]) (10/2016); Pneumonia; and Wrist fracture (1993).    PAST SURGICAL HISTORY:  Past Surgical History:  Procedure Laterality Date  . ABDOMINAL  HYSTERECTOMY    . CATARACT EXTRACTION W/ INTRAOCULAR LENS  IMPLANT, BILATERAL    . CHEST TUBE INSERTION Left 12/18/2016   Procedure: CHEST TUBE INSERTION;  Surgeon: [name], MD;  Location: [facility] ORS;  Service: General;  Laterality: Left;  . CHOLECYSTECTOMY    . PORTACATH PLACEMENT Left 12/18/2016   Procedure: INSERTION PORT-A-CATH;  Surgeon: [name], MD;  Location: [facility] ORS;  Service: General;  Laterality: Left;  . TONSILLECTOMY      FAMILY HISTORY: family history includes Atrial fibrillation in her sister; Congestive Heart Failure in her mother; Heart attack in her father; Heart disease in her father; Hypertension in her father; Stroke in her mother.  SOCIAL HISTORY:  reports that she quit smoking about 6 weeks ago. She has a 68.00 pack-year smoking history. She has never used smokeless tobacco. She reports that she does not drink alcohol or use drugs.  ALLERGIES: Sulfa antibiotics and Levaquin [levofloxacin]  MEDICATIONS:  Current Outpatient Prescriptions  Medication Sig Dispense Refill  . acetaminophen (TYLENOL) 325 MG tablet Take 650 mg by mouth daily as needed.     Marland Kitchen amiodarone (PACERONE) 200 MG tablet Take 1 tablet (200 mg total) by mouth 2 (two) times daily. 180 tablet 3  . apixaban (ELIQUIS) 5 MG TABS tablet Take 1 tablet (5 mg total) by mouth 2 (two) times daily. 60 tablet 0  . cetirizine (ZYRTEC) 10 MG tablet Take 10 mg by mouth daily as needed for allergies.    . cholecalciferol (VITAMIN D) 1000 units tablet Take 1,000 Units by mouth daily.    Marland Kitchen diltiazem (CARDIZEM CD) 180 MG 24 hr capsule Take 1 capsule (  180 mg total) by mouth daily. 90 capsule 3  . furosemide (LASIX) 20 MG tablet Take 1 tablet by mouth daily as needed. For EDEMA  3  . hydrOXYzine (ATARAX/VISTARIL) 25 MG tablet Take 1 tablet (25 mg total) by mouth every 6 (six) hours as needed. For itching. 120 tablet 0  . lidocaine-prilocaine (EMLA) cream Apply to affected area once (Patient not taking: Reported on  12/18/2016) 30 g 3  . methylPREDNISolone (MEDROL DOSEPAK) 4 MG TBPK tablet Take as directed 21 tablet 0  . nicotine (NICODERM CQ - DOSED IN MG/24 HOURS) 21 mg/24hr patch Place 1 patch (21 mg total) onto the skin daily. (Patient not taking: Reported on 12/13/2016) 28 patch 0  . omeprazole (PRILOSEC) 20 MG capsule Take 20 mg by mouth daily.    . ondansetron (ZOFRAN) 8 MG tablet Take 1 tablet (8 mg total) by mouth 2 (two) times daily as needed for refractory nausea / vomiting. (Patient not taking: Reported on 12/18/2016) 60 tablet 2  . potassium chloride SA (K-DUR,KLOR-CON) 20 MEQ tablet Take 2 tablets (40 mEq total) by mouth 2 (two) times daily. 20 tablet 0  . prochlorperazine (COMPAZINE) 10 MG tablet Take 1 tablet (10 mg total) by mouth every 6 (six) hours as needed (Nausea or vomiting). (Patient not taking: Reported on 12/18/2016) 60 tablet 2   No current facility-administered medications for this encounter.     ECOG PERFORMANCE STATUS:  1 - Symptomatic but completely ambulatory  REVIEW OF SYSTEMS: except for the left rib pain and mild cough and dyspnea on exertion Patient denies any weight loss, fatigue, weakness, fever, chills or night sweats. Patient denies any loss of vision, blurred vision. Patient denies any ringing  of the ears or hearing loss. No irregular heartbeat. Patient denies heart murmur or history of fainting. Patient denies any chest pain or pain radiating to her upper extremities. Patient denies any shortness of breath, difficulty breathing at night, cough or hemoptysis. Patient denies any swelling in the lower legs. Patient denies any nausea vomiting, vomiting of blood, or coffee ground material in the vomitus. Patient denies any stomach pain. Patient states has had normal bowel movements no significant constipation or diarrhea. Patient denies any dysuria, hematuria or significant nocturia. Patient denies any problems walking, swelling in the joints or loss of balance. Patient denies any  skin changes, loss of hair or loss of weight. Patient denies any excessive worrying or anxiety or significant depression. Patient denies any problems with insomnia. Patient denies excessive thirst, polyuria, polydipsia. Patient denies any swollen glands, patient denies easy bruising or easy bleeding. Patient denies any recent infections, allergies or URI. Patient "s visual fields have not changed significantly in recent time.    PHYSICAL EXAM: BP 125/67   Pulse 65   Temp 98 F (36.7 C)   Wt 168 lb 1.6 oz (76.2 kg)   BMI 29.78 kg/m  Well-developed elderly female in NAD. There is pain on palpation of her lateral left ribs in the region of the 10th rib. Patient's had aPleurx catheter placed yesterday which she tolerated well. Well-developed well-nourished patient in NAD. HEENT reveals PERLA, EOMI, discs not visualized.  Oral cavity is clear. No oral mucosal lesions are identified. Neck is clear without evidence of cervical or supraclavicular adenopathy. Lungs are clear to A&P. Cardiac examination is essentially unremarkable with regular rate and rhythm without murmur rub or thrill. Abdomen is benign with no organomegaly or masses noted. Motor sensory and DTR levels are equal and symmetric in the  upper and lower extremities. Cranial nerves II through XII are grossly intact. Proprioception is intact. No peripheral adenopathy or edema is identified. No motor or sensory levels are noted. Crude visual fields are within normal range.  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 In 83 year old female  PLAN: at this time I to go ahead with a single fraction course of external beam radiation therapy to her left rib. I would use or PET CT scan to delineate my area of rib involvement. Would treat to 800 cGy in 1 fraction. Risks and benefits of treatment  including possible skin reaction fatigue all were discussed in detail with the patient and her daughter. I've [place] set up and ordered CT simulation for next week. Patient seems to comprehend my treatment plan well.  I would like to take this opportunity to thank you for allowing me to participate in the care of your patient.Armstead Peaks., MD

## 2016-12-21 ENCOUNTER — Inpatient Hospital Stay: Payer: Medicare HMO

## 2016-12-21 ENCOUNTER — Ambulatory Visit: Payer: Self-pay

## 2016-12-21 ENCOUNTER — Inpatient Hospital Stay (HOSPITAL_BASED_OUTPATIENT_CLINIC_OR_DEPARTMENT_OTHER): Payer: Medicare HMO | Admitting: Oncology

## 2016-12-21 ENCOUNTER — Other Ambulatory Visit: Payer: Self-pay

## 2016-12-21 VITALS — BP 108/60 | HR 66 | Resp 20

## 2016-12-21 VITALS — BP 135/69 | HR 75 | Temp 98.6°F | Resp 18 | Wt 167.2 lb

## 2016-12-21 DIAGNOSIS — J449 Chronic obstructive pulmonary disease, unspecified: Secondary | ICD-10-CM

## 2016-12-21 DIAGNOSIS — I509 Heart failure, unspecified: Secondary | ICD-10-CM | POA: Diagnosis not present

## 2016-12-21 DIAGNOSIS — C3492 Malignant neoplasm of unspecified part of left bronchus or lung: Secondary | ICD-10-CM

## 2016-12-21 DIAGNOSIS — Z5111 Encounter for antineoplastic chemotherapy: Secondary | ICD-10-CM | POA: Diagnosis not present

## 2016-12-21 DIAGNOSIS — R21 Rash and other nonspecific skin eruption: Secondary | ICD-10-CM | POA: Diagnosis not present

## 2016-12-21 DIAGNOSIS — J90 Pleural effusion, not elsewhere classified: Secondary | ICD-10-CM | POA: Diagnosis not present

## 2016-12-21 DIAGNOSIS — Z8781 Personal history of (healed) traumatic fracture: Secondary | ICD-10-CM

## 2016-12-21 DIAGNOSIS — R531 Weakness: Secondary | ICD-10-CM | POA: Diagnosis not present

## 2016-12-21 DIAGNOSIS — R5383 Other fatigue: Secondary | ICD-10-CM | POA: Diagnosis not present

## 2016-12-21 DIAGNOSIS — Z79899 Other long term (current) drug therapy: Secondary | ICD-10-CM

## 2016-12-21 DIAGNOSIS — Z7982 Long term (current) use of aspirin: Secondary | ICD-10-CM | POA: Diagnosis not present

## 2016-12-21 DIAGNOSIS — C7951 Secondary malignant neoplasm of bone: Secondary | ICD-10-CM | POA: Diagnosis not present

## 2016-12-21 DIAGNOSIS — I700 Atherosclerosis of aorta: Secondary | ICD-10-CM

## 2016-12-21 DIAGNOSIS — Z8711 Personal history of peptic ulcer disease: Secondary | ICD-10-CM

## 2016-12-21 DIAGNOSIS — Z8701 Personal history of pneumonia (recurrent): Secondary | ICD-10-CM

## 2016-12-21 DIAGNOSIS — I4891 Unspecified atrial fibrillation: Secondary | ICD-10-CM | POA: Diagnosis not present

## 2016-12-21 DIAGNOSIS — E871 Hypo-osmolality and hyponatremia: Secondary | ICD-10-CM | POA: Diagnosis not present

## 2016-12-21 DIAGNOSIS — Z87891 Personal history of nicotine dependence: Secondary | ICD-10-CM | POA: Diagnosis not present

## 2016-12-21 LAB — COMPREHENSIVE METABOLIC PANEL
ALT: 13 U/L — ABNORMAL LOW (ref 14–54)
ANION GAP: 7 (ref 5–15)
AST: 21 U/L (ref 15–41)
Albumin: 2.9 g/dL — ABNORMAL LOW (ref 3.5–5.0)
Alkaline Phosphatase: 88 U/L (ref 38–126)
BILIRUBIN TOTAL: 0.6 mg/dL (ref 0.3–1.2)
BUN: 22 mg/dL — AB (ref 6–20)
CALCIUM: 8.6 mg/dL — AB (ref 8.9–10.3)
CO2: 29 mmol/L (ref 22–32)
CREATININE: 0.86 mg/dL (ref 0.44–1.00)
Chloride: 99 mmol/L — ABNORMAL LOW (ref 101–111)
GFR calc Af Amer: >60 mL/min (ref >60)
GLUCOSE: 122 mg/dL — AB (ref 65–99)
Potassium: 3.8 mmol/L (ref 3.5–5.1)
Sodium: 135 mmol/L (ref 135–145)
TOTAL PROTEIN: 5.6 g/dL — AB (ref 6.5–8.1)

## 2016-12-21 LAB — CBC WITH DIFFERENTIAL/PLATELET
Basophils Absolute: 0.1 10*3/uL (ref 0–0.1)
Basophils Relative: 1 %
EOS PCT: 9 %
Eosinophils Absolute: 1.4 10*3/uL — ABNORMAL HIGH (ref 0–0.7)
HCT: 35.1 % (ref 35.0–47.0)
Hemoglobin: 12 g/dL (ref 12.0–16.0)
LYMPHS ABS: 1.6 10*3/uL (ref 1.0–3.6)
LYMPHS PCT: 11 %
MCH: 27.8 pg (ref 26.0–34.0)
MCHC: 34.1 g/dL (ref 32.0–36.0)
MCV: 81.5 fL (ref 80.0–100.0)
MONO ABS: 1.8 10*3/uL — AB (ref 0.2–0.9)
MONOS PCT: 11 %
Neutro Abs: 10.5 10*3/uL — ABNORMAL HIGH (ref 1.4–6.5)
Neutrophils Relative %: 68 %
PLATELETS: 228 10*3/uL (ref 150–440)
RBC: 4.31 MIL/uL (ref 3.80–5.20)
RDW: 16.2 % — AB (ref 11.5–14.5)
WBC: 15.4 10*3/uL — ABNORMAL HIGH (ref 3.6–11.0)

## 2016-12-21 MED ORDER — SODIUM CHLORIDE 0.9 % IV SOLN
Freq: Once | INTRAVENOUS | Status: AC
  Administered 2016-12-21: 11:00:00 via INTRAVENOUS
  Filled 2016-12-21: qty 1000

## 2016-12-21 MED ORDER — SODIUM CHLORIDE 0.9 % IV SOLN: 10.0000 mg | Freq: Once | INTRAVENOUS | Status: DC

## 2016-12-21 MED ORDER — HEPARIN SOD (PORK) LOCK FLUSH 100 UNIT/ML IV SOLN
500.0000 [IU] | Freq: Once | INTRAVENOUS | Status: AC | PRN
  Administered 2016-12-21: 500 [IU]

## 2016-12-21 MED ORDER — DIPHENHYDRAMINE HCL 50 MG/ML IJ SOLN
25.0000 mg | Freq: Once | INTRAMUSCULAR | Status: AC
  Administered 2016-12-21: 25 mg via INTRAVENOUS
  Filled 2016-12-21: qty 1

## 2016-12-21 MED ORDER — SODIUM CHLORIDE 0.9% FLUSH
10.0000 mL | INTRAVENOUS | Status: DC | PRN
  Filled 2016-12-21: qty 10

## 2016-12-21 MED ORDER — DEXAMETHASONE SODIUM PHOSPHATE 10 MG/ML IJ SOLN
10.0000 mg | Freq: Once | INTRAMUSCULAR | Status: AC
  Administered 2016-12-21: 10 mg via INTRAVENOUS
  Filled 2016-12-21: qty 1

## 2016-12-21 MED ORDER — FAMOTIDINE IN NACL 20-0.9 MG/50ML-% IV SOLN
20.0000 mg | Freq: Once | INTRAVENOUS | Status: AC
  Administered 2016-12-21: 20 mg via INTRAVENOUS
  Filled 2016-12-21: qty 50

## 2016-12-21 MED ORDER — SODIUM CHLORIDE 0.9 % IV SOLN
390.0000 mg | Freq: Once | INTRAVENOUS | Status: AC
  Administered 2016-12-21: 390 mg via INTRAVENOUS
  Filled 2016-12-21: qty 39

## 2016-12-21 MED ORDER — PACLITAXEL CHEMO INJECTION 300 MG/50ML
80.0000 mg/m2 | Freq: Once | INTRAVENOUS | Status: AC
  Administered 2016-12-21: 150 mg via INTRAVENOUS
  Filled 2016-12-21: qty 25

## 2016-12-21 MED ORDER — PALONOSETRON HCL INJECTION 0.25 MG/5ML
0.2500 mg | Freq: Once | INTRAVENOUS | Status: AC
  Administered 2016-12-21: 0.25 mg via INTRAVENOUS
  Filled 2016-12-21: qty 5

## 2016-12-21 NOTE — Progress Notes (Signed)
Patient is here for follow up, she has itching. Her daughter wanted to ask about immunotherapy. Her numbers came back high and they had questions

## 2016-12-22 ENCOUNTER — Ambulatory Visit: Payer: Medicare HMO

## 2016-12-22 ENCOUNTER — Telehealth: Payer: Self-pay

## 2016-12-22 VITALS — Ht 63.0 in

## 2016-12-22 DIAGNOSIS — J90 Pleural effusion, not elsewhere classified: Secondary | ICD-10-CM

## 2016-12-22 NOTE — Progress Notes (Addendum)
Patient was brought into room, Pleurx was drained for 171ml of golden fluid. Patient did experience painful muscles spasms in the left rib cage while draining occurred. Area around catheter was cleaned thoroughly and replaced with new dressing supplies in sterile fashion.  It was explained to patient that since she has significant pain that I would speak to Dr. Genevive Bi about recommendations prior to next drainage. Home Health will go out to do dressing changes on Sunday and Tuesday but will not drain patient until she is seen back in our office.  We will see her back on Thursday morning with a Chest X-ray before and she was instructed to call with any questions or concerns, prior to this appointment.  Home Health: [facility]  Time spent with patient: 30 Minutes

## 2016-12-22 NOTE — Telephone Encounter (Signed)
Dr. Genevive Bi was notified about patient's pain during Pleurx Drainage as well as her being off of her Eliquis. He has requested that she restart her Eliquis today. He feels that the source of her pain with drainage is tumor related and should get better as chemo is completed. But there is not a medication or technique that will help this pain during drainage, unfortunately.  Call was returned to patient's daughter after speaking with Dr. Genevive Bi. I explained everything to her above and she verbalizes understanding of this.

## 2016-12-22 NOTE — Patient Instructions (Addendum)
Home Health will come out and change your dressing on Sunday and Tuesday.   We will see you back in the office on Thursday morning for suture removal, dressing change, pleurx drainage, and to see Dr. Genevive Bi.  Please arrive at the [facility] at 0800 for a Chest X-ray prior to your appointment. Then come right to the office as soon as the x-ray is complete.  If you are Short of Breath prior to this appointment, please call the office and let me know. Also call if you have any questions or concerns.

## 2016-12-25 ENCOUNTER — Other Ambulatory Visit: Payer: Self-pay | Admitting: Cardiothoracic Surgery

## 2016-12-25 ENCOUNTER — Telehealth: Payer: Self-pay | Admitting: *Deleted

## 2016-12-25 ENCOUNTER — Other Ambulatory Visit: Payer: Self-pay | Admitting: *Deleted

## 2016-12-25 ENCOUNTER — Telehealth: Payer: Self-pay | Admitting: General Practice

## 2016-12-25 NOTE — Telephone Encounter (Signed)
Spoke with patient's daughter at this time. She states that Home Nurse was out to drain catheter yesterday and it would only drain a few drops. She has had significant pain with deep breathing since this was drained on Friday by myself. She is having tremendous amounts of drainage come out on the dressing around the catheter but nothing in the line. She would like Dr. Genevive Bi to take a look at this for evaluation.

## 2016-12-25 NOTE — Telephone Encounter (Signed)
Patient's daughter is calling and is a little concern due to her mothers incision is leaking out a lot to where it gets all over her clothes , said it hurts to take a deep breath pain level being a 4 what patient told her home health nurse, patient is due to come in on Thursday at 1:15 with a chest xray prior, daughter is asking if patient could come in after 2:00 if that's okay. Please call patient and advice.

## 2016-12-25 NOTE — Telephone Encounter (Signed)
Call made to Dr. Genevive Bi at this time. Awaiting return phone call.

## 2016-12-25 NOTE — Telephone Encounter (Addendum)
I spoke with Dr. Genevive Bi and he would like to see patient in am with Chest X-ray prior. Order placed and patient's daughter notified to bring patient to [facility] for chest x-ray.

## 2016-12-25 NOTE — Telephone Encounter (Signed)
Called pt to see how she is feeling today, pt states she is feeling better. Home health RN came out and changed her dressing, she states she has felt much better since then. Pt denies need for office visit or IVF today, advised pt to call back if she has any concerns. Pt verbalized understanding.

## 2016-12-26 ENCOUNTER — Encounter: Payer: Self-pay | Admitting: Cardiothoracic Surgery

## 2016-12-26 ENCOUNTER — Ambulatory Visit
Admission: RE | Admit: 2016-12-26 | Discharge: 2016-12-26 | Disposition: A | Discharge status: Home / Self Care | Payer: Medicare HMO | Source: Ambulatory Visit | Attending: Cardiothoracic Surgery | Admitting: Cardiothoracic Surgery

## 2016-12-26 ENCOUNTER — Ambulatory Visit
Admission: RE | Admit: 2016-12-26 | Discharge: 2016-12-26 | Disposition: A | Discharge status: Home / Self Care | Payer: Medicare HMO | Source: Ambulatory Visit | Attending: Radiation Oncology | Admitting: Radiation Oncology

## 2016-12-26 ENCOUNTER — Ambulatory Visit (INDEPENDENT_AMBULATORY_CARE_PROVIDER_SITE_OTHER): Payer: Medicare HMO | Admitting: Cardiothoracic Surgery

## 2016-12-26 VITALS — BP 126/78 | HR 84 | Temp 98.1°F | Ht 63.0 in | Wt 161.8 lb

## 2016-12-26 DIAGNOSIS — J90 Pleural effusion, not elsewhere classified: Secondary | ICD-10-CM

## 2016-12-26 DIAGNOSIS — Z510 Encounter for antineoplastic radiation therapy: Secondary | ICD-10-CM | POA: Diagnosis not present

## 2016-12-26 DIAGNOSIS — J449 Chronic obstructive pulmonary disease, unspecified: Secondary | ICD-10-CM | POA: Insufficient documentation

## 2016-12-26 DIAGNOSIS — C3432 Malignant neoplasm of lower lobe, left bronchus or lung: Secondary | ICD-10-CM

## 2016-12-26 MED ORDER — HYDROCODONE-ACETAMINOPHEN 5-325 MG PO TABS: 1.0000 | ORAL_TABLET | ORAL | 0 refills | Status: DC | PRN

## 2016-12-26 NOTE — Progress Notes (Signed)
  Patient ID: [name], female   DOB: 1932/11/10, 83 y.o.   MRN: [id number]  HISTORY: She comes in today with complaints of increased drainage around her Pleurx catheter. She states that it has drained a considerable amount saturating the dressings 3-4 times per day. She has drained the catheter twice since it was placed and there has been no drainage from it. I was concerned the catheter may be occluded and we did obtain a chest x-ray today. She is somewhat tearful today and is concerned about the drainage around the catheter as well as her overall prognosis.   Vitals:   12/26/16 0810  BP: 126/78  Pulse: 84  Temp: 98.1 F (36.7 C)  SpO2: 98%     EXAM:  We did try to drain the catheter today. Will only were able to remove about 30 cc. The wounds are all clean dry and intact. There is some serous drainage around the catheter and the dressing itself was saturated will we removed it. This had been in place for about 12 hours. The chest x-ray today shows no pneumothorax and no pleural effusion.  ASSESSMENT: There has been persistent drainage around the catheter. I believe that this is secondary to a combination of her recent chemotherapy and high-dose steroids which she was taking for her skin rash.   PLAN:   I had a long discussion with her and her granddaughter. I explained to them that the catheter could be removed relatively easily. Unfortunately the fluid may accumulate she may need additional thoracentesis or even a repeat Pleurx catheter insertion. I explained to them that I was concerned about the possibility of an infection but there was no sign of that at this point. We have come to the agreement that we will wait another week and she will diligently clean and change the dressings as needed. They were instructed on how to manage the catheter. In addition I did give them a prescription for pain medication as she states that she's having significant discomfort in her back and she will  see Dr. Donella Stade today for that.  I will see her back again in one week for suture removal.    [name], MD

## 2016-12-26 NOTE — Progress Notes (Signed)
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  INTERVAL HISTORY: Patient returns to clinic today for further evaluation and consideration of cycle 1, day 8 of carboplatinum and Taxol. Taxol only today. Patient had issues with her Pleurx catheter this past week, but otherwise tolerated her treatment well. Her weakness and fatigue have improved. Her rash is improving. She has no neurologic complaints. She does not complain of rib pain today. She has a fair appetite, but denies weight loss. She has no chest pain, shortness of breath, cough, or hemoptysis. She denies any nausea, vomiting, constipation, or diarrhea. She has no urinary complaints. Patient offers no further specific complaints today.  REVIEW OF SYSTEMS:   Review of Systems  Constitutional: Positive for malaise/fatigue. Negative for fever and weight loss.  Respiratory: Negative.  Negative for cough and shortness of breath.   Cardiovascular: Negative.  Negative for chest pain and leg swelling.  Gastrointestinal: Negative.  Negative for abdominal pain.  Genitourinary: Negative.   Musculoskeletal: Positive for back pain.  Skin: Negative.  Negative for itching and rash.  Neurological: Positive for weakness. Negative for sensory change.  Psychiatric/Behavioral: Negative.  The patient is not nervous/anxious.     As per HPI. Otherwise, a complete review of systems is negative.  PAST MEDICAL HISTORY: Past Medical History:  Diagnosis Date  . COPD (chronic obstructive pulmonary disease) ([place])   . GU (gastric peptic ulcer)   . Lung cancer ([place])   . Malignant pleural effusion   . PAF (paroxysmal atrial fibrillation) ([place]) 10/2016   a. 10/2016 post  thoracentesis ; b. 10/2016 Echo: EF 65-70%, mild LVH;  c. Amio/Eliquis initiated (CHA2DSVASc = 3).  . Pneumonia   . Wrist fracture 1993   bilateral    PAST SURGICAL HISTORY: Past Surgical History:  Procedure Laterality Date  . ABDOMINAL HYSTERECTOMY    . CATARACT EXTRACTION W/ INTRAOCULAR LENS  IMPLANT, BILATERAL    . CHEST TUBE INSERTION Left 12/18/2016   Procedure: CHEST TUBE INSERTION;  Surgeon: [name], MD;  Location: [facility] ORS;  Service: General;  Laterality: Left;  . CHOLECYSTECTOMY    . PORTACATH PLACEMENT Left 12/18/2016   Procedure: INSERTION PORT-A-CATH;  Surgeon: [name], MD;  Location: [facility] ORS;  Service: General;  Laterality: Left;  . TONSILLECTOMY      FAMILY HISTORY: Family History  Problem Relation Age of Onset  . Hypertension Father   . Heart disease Father   . Heart attack Father   . Congestive Heart Failure Mother   . Stroke Mother   . Atrial fibrillation Sister     ADVANCED DIRECTIVES (Y/N):  N  HEALTH MAINTENANCE: Social History  Substance Use Topics  . Smoking status: Former Smoker    Packs/day: 1.00    Years: 68.00    Quit date: 11/06/2016  . Smokeless tobacco: Never Used  . Alcohol use No     Colonoscopy:  PAP:  Bone density:  Lipid panel:  Allergies  Allergen Reactions  . Sulfa Antibiotics Other (See Comments)    Seizures   . Levaquin [Levofloxacin] Hives and Itching    Current Outpatient Prescriptions  Medication Sig Dispense Refill  . acetaminophen (TYLENOL) 325 MG tablet Take 650 mg by mouth daily as needed.     Marland Kitchen  apixaban (ELIQUIS) 5 MG TABS tablet Take 1 tablet (5 mg total) by mouth 2 (two) times daily. 60 tablet 0  . cetirizine (ZYRTEC) 10 MG tablet Take 10 mg by mouth daily as needed for allergies.    . cholecalciferol (VITAMIN D) 1000 units tablet Take 1,000 Units by mouth daily.    . diltiazem (CARDIZEM CD) 180 MG 24 hr capsule Take 1 capsule (180 mg total) by mouth daily. 90 capsule 3  . furosemide (LASIX) 20 MG  tablet Take 1 tablet by mouth daily as needed. For EDEMA  3  . HYDROcodone-acetaminophen (NORCO) 5-325 MG tablet Take 1 tablet by mouth every 4 (four) hours as needed for moderate pain. 30 tablet 0  . hydrOXYzine (ATARAX/VISTARIL) 25 MG tablet Take 1 tablet (25 mg total) by mouth every 6 (six) hours as needed. For itching. 120 tablet 0  . lidocaine-prilocaine (EMLA) cream Apply to affected area once 30 g 3  . omeprazole (PRILOSEC) 20 MG capsule Take 20 mg by mouth daily.    . POTASSIUM CHLORIDE PO Take 40 mg by mouth daily.    . nystatin-triamcinolone ointment (MYCOLOG) Apply 1 application topically 2 (two) times daily. 30 g 0  . ondansetron (ZOFRAN) 8 MG tablet Take 1 tablet (8 mg total) by mouth 2 (two) times daily as needed for refractory nausea / vomiting. (Patient not taking: Reported on 12/28/2016) 60 tablet 2  . prochlorperazine (COMPAZINE) 10 MG tablet Take 1 tablet (10 mg total) by mouth every 6 (six) hours as needed (Nausea or vomiting). (Patient not taking: Reported on 12/28/2016) 60 tablet 2   No current facility-administered medications for this visit.     OBJECTIVE: Vitals:   12/28/16 1001  BP: (!) 105/57  Pulse: 78  Resp: 18  Temp: 98 F (36.7 C)     Body mass index is 29.05 kg/m.    ECOG FS:1 - Symptomatic but completely ambulatory  General: Well-developed, well-nourished, no acute distress. Eyes: Pink conjunctiva, anicteric sclera. Lungs: Clear to auscultation bilaterally. Heart: Regular rate and rhythm. No rubs, murmurs, or gallops. Abdomen: Soft, nontender, nondistended. No organomegaly noted, normoactive bowel sounds. Musculoskeletal: No edema, cyanosis, or clubbing. Neuro: Alert, answering all questions appropriately. Cranial nerves grossly intact. Skin: Maculopapular rash noted, Improved. Psych: Normal affect.    LAB RESULTS:  Lab Results  Component Value Date   NA 130 (L) 12/28/2016   K 3.9 12/28/2016   CL 95 (L) 12/28/2016   CO2 24 12/28/2016    GLUCOSE 122 (H) 12/28/2016   BUN 17 12/28/2016   CREATININE 0.88 12/28/2016   CALCIUM 8.4 (L) 12/28/2016   PROT 5.9 (L) 12/28/2016   ALBUMIN 2.8 (L) 12/28/2016   AST 18 12/28/2016   ALT 13 (L) 12/28/2016   ALKPHOS 94 12/28/2016   BILITOT 0.6 12/28/2016   GFRNONAA 60 (L) 12/28/2016   GFRAA >60 12/28/2016    Lab Results  Component Value Date   WBC 10.2 12/28/2016   NEUTROABS 8.3 (H) 12/28/2016   HGB 11.1 (L) 12/28/2016   HCT 33.3 (L) 12/28/2016   MCV 81.5 12/28/2016   PLT 209 12/28/2016     STUDIES: Dg Chest 1 View  Result Date: 12/14/2016 CLINICAL DATA:  Left thoracentesis . EXAM: CHEST 1 VIEW COMPARISON:  12/08/2016. FINDINGS: Interim left left thoracentesis. Removal of significant amount of left pleural fluid with minimal residual effusion. No pneumothorax. Cardiomegaly again noted. Mild bilateral interstitial prominence. Mild component CHF may be present. Pneumonitis cannot be excluded . IMPRESSION: 1. Left thoracentesis with   removal of significant amount left pleural fluid. No evidence of pneumothorax . 2. Cardiomegaly with mild bilateral interstitial prominence. Mild component of CHF view present. Mild pneumonitis cannot be excluded . Electronically Signed   By: Thomas  Register   On: 12/14/2016 14:36   Dg Chest 2 View  Result Date: 12/26/2016 CLINICAL DATA:  Left chest pain.  Pleural effusion. EXAM: CHEST  2 VIEW COMPARISON:  12/18/2016 FINDINGS: Left Port-A-Cath remains in place, unchanged. PleurX drainage catheter again noted at the left base. No pneumothorax. No visible significant effusion. Heart is normal size. There is hyperinflation of the lungs compatible with COPD. Chronic bibasilar densities likely reflect atelectasis or scarring. IMPRESSION: Left PleurX drainage catheter remains in place.  No pneumothorax. Chronic bibasilar atelectasis or scarring. COPD. Electronically Signed   By: Kevin  Dover M.D.   On: 12/26/2016 08:04   Dg Chest 2 View  Result Date:  12/08/2016 CLINICAL DATA:  Shortness of breath. EXAM: CHEST  2 VIEW COMPARISON:  Radiograph of November 12, 2016. FINDINGS: Stable cardiomegaly. Atherosclerosis of thoracic aorta is noted. No pneumothorax is noted. Large left pleural effusion is noted with probable underlying atelectasis or infiltrate. Minimal right basilar subsegmental atelectasis. Bony thorax is unremarkable. IMPRESSION: Aortic atherosclerosis. Large left pleural effusion is stable with probable underlying atelectasis or infiltrate. Electronically Signed   By: James  Green Jr, M.D.   On: 12/08/2016 13:39   Ct Biopsy  Result Date: 12/06/2016 INDICATION: Concern for metastatic lung cancer. Please perform CT-guided biopsy for tissue diagnostic purposes. EXAM: CT-GUIDED BIOPSY OF HYPERMETABOLIC LEFT LATERAL CHEST WALL MASS. COMPARISON:  PET-CT - 11/29/2016; chest CT - 11/13/2016 MEDICATIONS: None. ANESTHESIA/SEDATION: Fentanyl 37.5 mcg IV; Versed 1 mg IV Sedation time: 13 minutes; The patient was continuously monitored during the procedure by the interventional radiology nurse under my direct supervision. CONTRAST:  None COMPLICATIONS: None immediate. PROCEDURE: Informed consent was obtained from the patient following an explanation of the procedure, risks, benefits and alternatives. The patient understands,agrees and consents for the procedure. All questions were addressed. A time out was performed prior to the initiation of the procedure. The patient was positioned supine, slightly RPO on the CT table and a limited chest CT was performed for procedural planning demonstrating unchanged size and appearance of the hypermetabolic mass involving the lateral aspect of the left tenth rib measuring approximately 3.2 x 2.5 cm (image 40, series 3). The operative site was prepped and draped in the usual sterile fashion. Under sterile conditions and local anesthesia, a 17 gauge coaxial needle was advanced into the peripheral aspect of the nodule. Positioning  was confirmed with intermittent CT fluoroscopy and followed by the acquisition of 5 core needle biopsies with an 18 gauge core needle biopsy device. The coaxial needle was removed and superficial hemostasis was achieved with manual compression. Limited post procedural chest CT was negative for pneumothorax or additional complication. A dressing was placed. The patient tolerated the procedure well without immediate postprocedural complication. The patient was escorted to have an upright chest radiograph. IMPRESSION: Technically successful CT guided core needle core biopsy of indeterminate hypermetabolic mass involving the lateral aspect of the left tenth rib. Electronically Signed   By: John  Watts M.D.   On: 12/06/2016 13:41   Dg Chest Port 1 View  Result Date: 12/18/2016 CLINICAL DATA:  Chest tube placement EXAM: PORTABLE CHEST 1 VIEW COMPARISON:  12/14/2016, 12/08/2016, PET-CT 11/29/2016 FINDINGS: Placement of a left-sided central venous port with the tip projecting over the SVC confluence. Interim insertion of left lower   chest tube with poorly visible tip, possibly coiled at the left CP angle. Decreased left pleural effusion or thickening. Patchy atelectasis or infiltrate at the left base. Stable cardiomediastinal silhouette. Questionable tiny left apical pneumothorax. IMPRESSION: 1. Insertion of left lower chest drainage tube with poorly visible tip, it is possibly coiled at the left CP angle 2. Decreased left pleural effusion or thickening. Possible tiny left apical pneumothorax. 3. Patchy atelectasis or infiltrate at the left lung base 4. Mild cardiomegaly Electronically Signed   By: Kim  Fujinaga M.D.   On: 12/18/2016 15:34   Dg C-arm 1-60 Min-no Report  Result Date: 12/18/2016 Fluoroscopy was utilized by the requesting physician.  No radiographic interpretation.   Us Thoracentesis Asp Pleural Space W/img Guide  Result Date: 12/14/2016 INDICATION: Malignant left pleural effusion. EXAM: ULTRASOUND  GUIDED LEFT THORACENTESIS MEDICATIONS: None. COMPLICATIONS: None immediate. PROCEDURE: An ultrasound guided thoracentesis was thoroughly discussed with the patient and questions answered. The benefits, risks, alternatives and complications were also discussed. The patient understands and wishes to proceed with the procedure. Written consent was obtained. Ultrasound was performed to localize and mark an adequate pocket of fluid in the left chest. The area was then prepped and draped in the normal sterile fashion. 1% Lidocaine was used for local anesthesia. Under ultrasound guidance a 6 Fr Safe-T-Centesis catheter was introduced. Thoracentesis was performed. The catheter was removed and a dressing applied. FINDINGS: A total of approximately 1.3 L of amber colored fluid was removed. IMPRESSION: Successful ultrasound guided left thoracentesis yielding 1.3 L of pleural fluid. Electronically Signed   By: Adam  Henn M.D.   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 Patient was scheduled for an MRI the brain, but canceled this appointment. This will need to be rescheduled in the near future to complete the staging workup. Patient has agreed to palliative chemotherapy with carboplatinum and Taxol, but given her advanced age will dose reduce carboplatinum and give weekly Taxol. Patient will receive carboplatinum AUC 5 on day 1 and Taxol 80 mg/m on days 1, 8, and 15. This will be a 28 day cycle. Patient has indicated that if her quality of life is effected negatively or side effects are intolerable, she would likely discontinue treatment. Her PDL-1 level is 90%, therefore immunotherapy may be of benefit in the future. Hospice was previsouly discussed, but the patient is not ready for this at this time.  Proceed with cycle 1, day 8, Taxol only today. Return to  clinic in 1 week for further evaluation and consideration of cycle 1, day 15. 2. Rib pain: Patient has had consultation with radiation oncology for XRT. 3. Rash: Improved. Possibly secondary to Levaquin. Monitor. 4. Hyponatremia: Patient's sodium level is 130, monitor.  Approximately 30 minutes was spent in discussion of which greater than 50% was consultation.  Patient expressed understanding and was in agreement with this plan. She also understands that She can call clinic at any time with any questions, concerns, or complaints.   Cancer Staging Squamous cell carcinoma lung, left (HCC) Staging form: Lung, AJCC 8th Edition - Clinical stage from 12/13/2016: Stage IVA (cT4, cN0, cM1a) - Signed by ,  J, MD on 12/13/2016    J , MD   12/31/2016 7:24 AM     

## 2016-12-26 NOTE — Patient Instructions (Signed)
Continue to drain your catheter every other day and change your dressing daily and when soiled.  I will contact Judeen Hammans (your home health nurse) and let her know these instructions.  Please take your pain medication prescription to your pharmacy to have filled.  We will see you back on Monday as scheduled below. Please call with any questions or concerns prior to your next appointment.

## 2016-12-27 ENCOUNTER — Ambulatory Visit
Admission: RE | Admit: 2016-12-27 | Discharge: 2016-12-27 | Disposition: A | Discharge status: Home / Self Care | Payer: Medicare HMO | Source: Ambulatory Visit | Attending: Radiation Oncology | Admitting: Radiation Oncology

## 2016-12-27 DIAGNOSIS — Z510 Encounter for antineoplastic radiation therapy: Secondary | ICD-10-CM | POA: Diagnosis not present

## 2016-12-28 ENCOUNTER — Inpatient Hospital Stay (HOSPITAL_BASED_OUTPATIENT_CLINIC_OR_DEPARTMENT_OTHER): Payer: Medicare HMO | Admitting: Oncology

## 2016-12-28 ENCOUNTER — Inpatient Hospital Stay: Payer: Medicare HMO

## 2016-12-28 ENCOUNTER — Encounter: Payer: Self-pay | Admitting: Cardiothoracic Surgery

## 2016-12-28 VITALS — BP 105/57 | HR 78 | Temp 98.0°F | Resp 18 | Wt 164.0 lb

## 2016-12-28 DIAGNOSIS — E871 Hypo-osmolality and hyponatremia: Secondary | ICD-10-CM

## 2016-12-28 DIAGNOSIS — C3492 Malignant neoplasm of unspecified part of left bronchus or lung: Secondary | ICD-10-CM

## 2016-12-28 DIAGNOSIS — C7951 Secondary malignant neoplasm of bone: Secondary | ICD-10-CM

## 2016-12-28 DIAGNOSIS — I4891 Unspecified atrial fibrillation: Secondary | ICD-10-CM

## 2016-12-28 DIAGNOSIS — Z8711 Personal history of peptic ulcer disease: Secondary | ICD-10-CM

## 2016-12-28 DIAGNOSIS — I509 Heart failure, unspecified: Secondary | ICD-10-CM

## 2016-12-28 DIAGNOSIS — R531 Weakness: Secondary | ICD-10-CM | POA: Diagnosis not present

## 2016-12-28 DIAGNOSIS — Z8781 Personal history of (healed) traumatic fracture: Secondary | ICD-10-CM

## 2016-12-28 DIAGNOSIS — J90 Pleural effusion, not elsewhere classified: Secondary | ICD-10-CM

## 2016-12-28 DIAGNOSIS — R5383 Other fatigue: Secondary | ICD-10-CM | POA: Diagnosis not present

## 2016-12-28 DIAGNOSIS — Z79899 Other long term (current) drug therapy: Secondary | ICD-10-CM

## 2016-12-28 DIAGNOSIS — Z8701 Personal history of pneumonia (recurrent): Secondary | ICD-10-CM

## 2016-12-28 DIAGNOSIS — I700 Atherosclerosis of aorta: Secondary | ICD-10-CM | POA: Diagnosis not present

## 2016-12-28 DIAGNOSIS — R21 Rash and other nonspecific skin eruption: Secondary | ICD-10-CM

## 2016-12-28 DIAGNOSIS — J449 Chronic obstructive pulmonary disease, unspecified: Secondary | ICD-10-CM

## 2016-12-28 DIAGNOSIS — T8149XA Infection following a procedure, other surgical site, initial encounter: Secondary | ICD-10-CM | POA: Diagnosis not present

## 2016-12-28 DIAGNOSIS — Z87891 Personal history of nicotine dependence: Secondary | ICD-10-CM

## 2016-12-28 DIAGNOSIS — J189 Pneumonia, unspecified organism: Secondary | ICD-10-CM | POA: Diagnosis not present

## 2016-12-28 DIAGNOSIS — Z7982 Long term (current) use of aspirin: Secondary | ICD-10-CM

## 2016-12-28 LAB — CBC WITH DIFFERENTIAL/PLATELET
BASOS ABS: 0.1 10*3/uL (ref 0–0.1)
Basophils Relative: 1 %
EOS PCT: 8 %
Eosinophils Absolute: 0.8 10*3/uL — ABNORMAL HIGH (ref 0–0.7)
HCT: 33.3 % — ABNORMAL LOW (ref 35.0–47.0)
Hemoglobin: 11.1 g/dL — ABNORMAL LOW (ref 12.0–16.0)
LYMPHS ABS: 0.6 10*3/uL — AB (ref 1.0–3.6)
LYMPHS PCT: 6 %
MCH: 27.2 pg (ref 26.0–34.0)
MCHC: 33.4 g/dL (ref 32.0–36.0)
MCV: 81.5 fL (ref 80.0–100.0)
MONO ABS: 0.4 10*3/uL (ref 0.2–0.9)
Monocytes Relative: 4 %
Neutro Abs: 8.3 10*3/uL — ABNORMAL HIGH (ref 1.4–6.5)
Neutrophils Relative %: 81 %
PLATELETS: 209 10*3/uL (ref 150–440)
RBC: 4.09 MIL/uL (ref 3.80–5.20)
RDW: 15.4 % — AB (ref 11.5–14.5)
WBC: 10.2 10*3/uL (ref 3.6–11.0)

## 2016-12-28 LAB — COMPREHENSIVE METABOLIC PANEL
ALT: 13 U/L — ABNORMAL LOW (ref 14–54)
AST: 18 U/L (ref 15–41)
Albumin: 2.8 g/dL — ABNORMAL LOW (ref 3.5–5.0)
Alkaline Phosphatase: 94 U/L (ref 38–126)
Anion gap: 11 (ref 5–15)
BUN: 17 mg/dL (ref 6–20)
CALCIUM: 8.4 mg/dL — AB (ref 8.9–10.3)
CHLORIDE: 95 mmol/L — AB (ref 101–111)
CO2: 24 mmol/L (ref 22–32)
CREATININE: 0.88 mg/dL (ref 0.44–1.00)
GFR, EST NON AFRICAN AMERICAN: 60 mL/min — AB (ref >60)
Glucose, Bld: 122 mg/dL — ABNORMAL HIGH (ref 65–99)
POTASSIUM: 3.9 mmol/L (ref 3.5–5.1)
Sodium: 130 mmol/L — ABNORMAL LOW (ref 135–145)
Total Bilirubin: 0.6 mg/dL (ref 0.3–1.2)
Total Protein: 5.9 g/dL — ABNORMAL LOW (ref 6.5–8.1)

## 2016-12-28 MED ORDER — SODIUM CHLORIDE 0.9 % IV SOLN: 10.0000 mg | Freq: Once | INTRAVENOUS | Status: DC

## 2016-12-28 MED ORDER — DEXTROSE 5 % IV SOLN
80.0000 mg/m2 | Freq: Once | INTRAVENOUS | Status: AC
  Administered 2016-12-28: 150 mg via INTRAVENOUS
  Filled 2016-12-28: qty 25

## 2016-12-28 MED ORDER — HEPARIN SOD (PORK) LOCK FLUSH 100 UNIT/ML IV SOLN
500.0000 [IU] | Freq: Once | INTRAVENOUS | Status: AC
  Administered 2016-12-28: 500 [IU] via INTRAVENOUS

## 2016-12-28 MED ORDER — HEPARIN SOD (PORK) LOCK FLUSH 100 UNIT/ML IV SOLN
INTRAVENOUS | Status: AC
  Filled 2016-12-28: qty 5

## 2016-12-28 MED ORDER — SODIUM CHLORIDE 0.9 % IV SOLN
Freq: Once | INTRAVENOUS | Status: AC
  Administered 2016-12-28: 11:00:00 via INTRAVENOUS
  Filled 2016-12-28: qty 1000

## 2016-12-28 MED ORDER — DIPHENHYDRAMINE HCL 50 MG/ML IJ SOLN
25.0000 mg | Freq: Once | INTRAMUSCULAR | Status: AC
  Administered 2016-12-28: 25 mg via INTRAVENOUS
  Filled 2016-12-28: qty 1

## 2016-12-28 MED ORDER — FAMOTIDINE IN NACL 20-0.9 MG/50ML-% IV SOLN
20.0000 mg | Freq: Once | INTRAVENOUS | Status: AC
  Administered 2016-12-28: 20 mg via INTRAVENOUS
  Filled 2016-12-28: qty 50

## 2016-12-28 MED ORDER — DEXAMETHASONE SODIUM PHOSPHATE 10 MG/ML IJ SOLN
10.0000 mg | Freq: Once | INTRAMUSCULAR | Status: AC
  Administered 2016-12-28: 10 mg via INTRAVENOUS
  Filled 2016-12-28: qty 3
  Filled 2016-12-28: qty 1

## 2016-12-28 NOTE — Progress Notes (Signed)
Na+ 130 and BP 105/57.  Per Dr. Grayland Ormond, Okay to proceed with treatment today, no extra interventions.

## 2016-12-30 ENCOUNTER — Other Ambulatory Visit: Payer: Self-pay | Admitting: Oncology

## 2016-12-30 MED ORDER — NYSTATIN-TRIAMCINOLONE 100000-0.1 UNIT/GM-% EX OINT: 1.0000 "application " | TOPICAL_OINTMENT | Freq: Two times a day (BID) | CUTANEOUS | 0 refills | Status: DC

## 2016-12-30 NOTE — Progress Notes (Unsigned)
Patient's daughter called and ask if patient is able to take a shower. Patient recently had medi port placed and pleurx catheter placement, still has stitches. I advise patient not to expose wound to water until the surgical site being examined by Dr.Oaks on Monday.  Also report rash under bilateral breast and that's why she wants to take shower. From her description, most likely candidiasis.  I send a prescription of Mycolog for her to try. If not improving or getting worse, advise her to call back.

## 2016-12-31 ENCOUNTER — Emergency Department: Payer: Medicare HMO

## 2016-12-31 ENCOUNTER — Inpatient Hospital Stay
Admission: EM | Admit: 2016-12-31 | Discharge: 2017-01-08 | DRG: 862 | Disposition: A | Discharge status: Skilled Nursing Facility | Payer: Medicare HMO | Attending: Internal Medicine | Admitting: Internal Medicine

## 2016-12-31 ENCOUNTER — Encounter: Payer: Self-pay | Admitting: *Deleted

## 2016-12-31 DIAGNOSIS — I480 Paroxysmal atrial fibrillation: Secondary | ICD-10-CM | POA: Diagnosis present

## 2016-12-31 DIAGNOSIS — C3492 Malignant neoplasm of unspecified part of left bronchus or lung: Secondary | ICD-10-CM | POA: Diagnosis present

## 2016-12-31 DIAGNOSIS — E871 Hypo-osmolality and hyponatremia: Secondary | ICD-10-CM | POA: Diagnosis present

## 2016-12-31 DIAGNOSIS — Z8711 Personal history of peptic ulcer disease: Secondary | ICD-10-CM

## 2016-12-31 DIAGNOSIS — F419 Anxiety disorder, unspecified: Secondary | ICD-10-CM | POA: Diagnosis present

## 2016-12-31 DIAGNOSIS — W010XXA Fall on same level from slipping, tripping and stumbling without subsequent striking against object, initial encounter: Secondary | ICD-10-CM | POA: Diagnosis not present

## 2016-12-31 DIAGNOSIS — B372 Candidiasis of skin and nail: Secondary | ICD-10-CM | POA: Diagnosis present

## 2016-12-31 DIAGNOSIS — S52572A Other intraarticular fracture of lower end of left radius, initial encounter for closed fracture: Secondary | ICD-10-CM | POA: Diagnosis not present

## 2016-12-31 DIAGNOSIS — I4891 Unspecified atrial fibrillation: Secondary | ICD-10-CM | POA: Diagnosis present

## 2016-12-31 DIAGNOSIS — I482 Chronic atrial fibrillation: Secondary | ICD-10-CM | POA: Diagnosis present

## 2016-12-31 DIAGNOSIS — T8149XA Infection following a procedure, other surgical site, initial encounter: Principal | ICD-10-CM | POA: Diagnosis present

## 2016-12-31 DIAGNOSIS — J440 Chronic obstructive pulmonary disease with acute lower respiratory infection: Secondary | ICD-10-CM | POA: Diagnosis present

## 2016-12-31 DIAGNOSIS — L03319 Cellulitis of trunk, unspecified: Secondary | ICD-10-CM

## 2016-12-31 DIAGNOSIS — Z888 Allergy status to other drugs, medicaments and biological substances status: Secondary | ICD-10-CM

## 2016-12-31 DIAGNOSIS — R079 Chest pain, unspecified: Secondary | ICD-10-CM | POA: Diagnosis present

## 2016-12-31 DIAGNOSIS — G4700 Insomnia, unspecified: Secondary | ICD-10-CM | POA: Diagnosis present

## 2016-12-31 DIAGNOSIS — E785 Hyperlipidemia, unspecified: Secondary | ICD-10-CM | POA: Diagnosis present

## 2016-12-31 DIAGNOSIS — Z8249 Family history of ischemic heart disease and other diseases of the circulatory system: Secondary | ICD-10-CM

## 2016-12-31 DIAGNOSIS — L03313 Cellulitis of chest wall: Secondary | ICD-10-CM | POA: Diagnosis present

## 2016-12-31 DIAGNOSIS — S0083XA Contusion of other part of head, initial encounter: Secondary | ICD-10-CM | POA: Diagnosis not present

## 2016-12-31 DIAGNOSIS — Y92230 Patient room in hospital as the place of occurrence of the external cause: Secondary | ICD-10-CM | POA: Diagnosis not present

## 2016-12-31 DIAGNOSIS — I959 Hypotension, unspecified: Secondary | ICD-10-CM | POA: Diagnosis present

## 2016-12-31 DIAGNOSIS — J189 Pneumonia, unspecified organism: Secondary | ICD-10-CM

## 2016-12-31 DIAGNOSIS — Z9889 Other specified postprocedural states: Secondary | ICD-10-CM

## 2016-12-31 DIAGNOSIS — Z90710 Acquired absence of both cervix and uterus: Secondary | ICD-10-CM

## 2016-12-31 DIAGNOSIS — Z79899 Other long term (current) drug therapy: Secondary | ICD-10-CM

## 2016-12-31 DIAGNOSIS — Z9221 Personal history of antineoplastic chemotherapy: Secondary | ICD-10-CM

## 2016-12-31 DIAGNOSIS — B9562 Methicillin resistant Staphylococcus aureus infection as the cause of diseases classified elsewhere: Secondary | ICD-10-CM | POA: Diagnosis present

## 2016-12-31 DIAGNOSIS — Z882 Allergy status to sulfonamides status: Secondary | ICD-10-CM

## 2016-12-31 DIAGNOSIS — R52 Pain, unspecified: Secondary | ICD-10-CM

## 2016-12-31 DIAGNOSIS — Z9842 Cataract extraction status, left eye: Secondary | ICD-10-CM

## 2016-12-31 DIAGNOSIS — Z66 Do not resuscitate: Secondary | ICD-10-CM | POA: Diagnosis present

## 2016-12-31 DIAGNOSIS — J910 Malignant pleural effusion: Secondary | ICD-10-CM | POA: Diagnosis present

## 2016-12-31 DIAGNOSIS — J15212 Pneumonia due to Methicillin resistant Staphylococcus aureus: Secondary | ICD-10-CM | POA: Diagnosis present

## 2016-12-31 DIAGNOSIS — Z823 Family history of stroke: Secondary | ICD-10-CM

## 2016-12-31 DIAGNOSIS — Z87891 Personal history of nicotine dependence: Secondary | ICD-10-CM

## 2016-12-31 DIAGNOSIS — Z9049 Acquired absence of other specified parts of digestive tract: Secondary | ICD-10-CM

## 2016-12-31 DIAGNOSIS — Z9841 Cataract extraction status, right eye: Secondary | ICD-10-CM

## 2016-12-31 DIAGNOSIS — Z961 Presence of intraocular lens: Secondary | ICD-10-CM | POA: Diagnosis present

## 2016-12-31 LAB — DIFFERENTIAL
BASOS PCT: 1 %
Basophils Absolute: 0 10*3/uL (ref 0–0.1)
Eosinophils Absolute: 0.1 10*3/uL (ref 0–0.7)
Eosinophils Relative: 3 %
LYMPHS PCT: 13 %
Lymphs Abs: 0.6 10*3/uL — ABNORMAL LOW (ref 1.0–3.6)
Monocytes Absolute: 0.1 10*3/uL — ABNORMAL LOW (ref 0.2–0.9)
Monocytes Relative: 1 %
NEUTROS ABS: 4 10*3/uL (ref 1.4–6.5)
Neutrophils Relative %: 82 %

## 2016-12-31 LAB — COMPREHENSIVE METABOLIC PANEL
ALT: 14 U/L (ref 14–54)
AST: 18 U/L (ref 15–41)
Albumin: 2.5 g/dL — ABNORMAL LOW (ref 3.5–5.0)
Alkaline Phosphatase: 97 U/L (ref 38–126)
Anion gap: 9 (ref 5–15)
BILIRUBIN TOTAL: 1.2 mg/dL (ref 0.3–1.2)
BUN: 16 mg/dL (ref 6–20)
CHLORIDE: 97 mmol/L — AB (ref 101–111)
CO2: 27 mmol/L (ref 22–32)
Calcium: 8.2 mg/dL — ABNORMAL LOW (ref 8.9–10.3)
Creatinine, Ser: 0.7 mg/dL (ref 0.44–1.00)
Glucose, Bld: 113 mg/dL — ABNORMAL HIGH (ref 65–99)
POTASSIUM: 3.9 mmol/L (ref 3.5–5.1)
Sodium: 133 mmol/L — ABNORMAL LOW (ref 135–145)
TOTAL PROTEIN: 6 g/dL — AB (ref 6.5–8.1)

## 2016-12-31 LAB — CBC
HCT: 32.3 % — ABNORMAL LOW (ref 35.0–47.0)
Hemoglobin: 11 g/dL — ABNORMAL LOW (ref 12.0–16.0)
MCH: 27.9 pg (ref 26.0–34.0)
MCHC: 34 g/dL (ref 32.0–36.0)
MCV: 82 fL (ref 80.0–100.0)
PLATELETS: 187 10*3/uL (ref 150–440)
RBC: 3.93 MIL/uL (ref 3.80–5.20)
RDW: 15.7 % — AB (ref 11.5–14.5)
WBC: 4.8 10*3/uL (ref 3.6–11.0)

## 2016-12-31 LAB — TROPONIN I

## 2016-12-31 MED ORDER — MORPHINE SULFATE (PF) 2 MG/ML IV SOLN
2.0000 mg | Freq: Once | INTRAVENOUS | Status: AC
  Administered 2016-12-31: 2 mg via INTRAVENOUS
  Filled 2016-12-31: qty 1

## 2016-12-31 MED ORDER — ALBUTEROL SULFATE (2.5 MG/3ML) 0.083% IN NEBU
5.0000 mg | INHALATION_SOLUTION | Freq: Once | RESPIRATORY_TRACT | Status: AC
  Administered 2016-12-31: 5 mg via RESPIRATORY_TRACT
  Filled 2016-12-31: qty 6

## 2016-12-31 MED ORDER — VANCOMYCIN HCL IN DEXTROSE 1-5 GM/200ML-% IV SOLN
1000.0000 mg | Freq: Once | INTRAVENOUS | Status: AC
  Administered 2016-12-31: 1000 mg via INTRAVENOUS
  Filled 2016-12-31: qty 200

## 2016-12-31 MED ORDER — SODIUM CHLORIDE 0.9 % IV SOLN
Freq: Once | INTRAVENOUS | Status: AC
  Administered 2016-12-31: 20:00:00 via INTRAVENOUS

## 2016-12-31 MED ORDER — MORPHINE SULFATE (PF) 2 MG/ML IV SOLN
INTRAVENOUS | Status: AC
  Filled 2016-12-31: qty 1

## 2016-12-31 MED ORDER — MORPHINE SULFATE (PF) 2 MG/ML IV SOLN
2.0000 mg | Freq: Once | INTRAVENOUS | Status: AC
  Administered 2016-12-31: 2 mg via INTRAVENOUS

## 2016-12-31 MED ORDER — ONDANSETRON HCL 4 MG/2ML IJ SOLN
4.0000 mg | Freq: Once | INTRAMUSCULAR | Status: AC
  Administered 2016-12-31: 4 mg via INTRAVENOUS
  Filled 2016-12-31: qty 2

## 2016-12-31 NOTE — ED Triage Notes (Signed)
Pt to ED after she began "coughing up blood" this afternoon. PT has hx of lung cancer and had a drainage tube placed 2 weeks ago after a pleural effusion that pt reports has been getting more and more painful. Pt reports having had chemo on thursday and worked in the yard Friday and has had pain in left chest and ribs since. SOB reports and pt is tachypnic in triage.

## 2016-12-31 NOTE — ED Notes (Signed)
Pt states that she had a drainage tube placed for a pleural effusion 2 weeks ago. Pt states that last week when she went to see Dr. Genevive Bi that the tube was not draining correctly, but that has since be corrected. Pt states that now she is having pain at the drainage tube site and she also feels like she is having trouble breathing. Pt denies chest pain.

## 2016-12-31 NOTE — ED Triage Notes (Signed)
FIRST NURSE NOTE-c/o SHOB. Mild tachypnea but unlabored at this time. Does report has drain and stage 4 lung CA. sats 97 at first nurse desk

## 2016-12-31 NOTE — ED Notes (Signed)
This EDT checked on pt for hourly rounding. The family assisted the pt to the restroom before I entered the room. The pt stated that her pain had returned and she was in incredible pain/ I informed the RN of this and I helped the pt with any other needed problems

## 2016-12-31 NOTE — ED Provider Notes (Signed)
[facility] Emergency Department Provider Note   ____________________________________________   First MD Initiated Contact with Patient 12/31/16 1547     (approximate)  I have reviewed the triage vital signs and the nursing notes.   HISTORY  Chief Complaint Cough and Weakness   HPI [name] is a 83 y.o. female Patient reports her chest tube is hurting. She had a chest tube put in for an effusion by Dr. Faith Solomon previously has plugged up once and was causing other problems but now it's hurting in that area fairly badly. She is not running a fever she is not having any other complaints at present although she told the nurse she said having some weakness and cough.Friday she been doing very well and actually appeared [employer] and Throwing Him off the Driveway.   Past Medical History:  Diagnosis Date  . COPD (chronic obstructive pulmonary disease) ([place])   . GU (gastric peptic ulcer)   . Lung cancer ([name])   . Malignant pleural effusion   . PAF (paroxysmal atrial fibrillation) ([name]) 10/2016   a. 10/2016 post thoracentesis ; b. 10/2016 Echo: EF 65-70%, mild LVH;  c. Amio/Eliquis initiated (CHA2DSVASc = 3).  . Pneumonia   . Wrist fracture 1993   bilateral    Patient Active Problem List   Diagnosis Date Noted  . Lung cancer ([name]) 12/18/2016  . Goals of care, counseling/discussion 12/17/2016  . Squamous cell carcinoma lung, left ([name]) 12/12/2016  . Atrial fibrillation ([name])   . SOB (shortness of breath) 11/12/2016  . Non-pressure chronic ulcer of right thigh with fat layer exposed ([name]) 03/16/2015    Past Surgical History:  Procedure Laterality Date  . ABDOMINAL HYSTERECTOMY    . CATARACT EXTRACTION W/ INTRAOCULAR LENS  IMPLANT, BILATERAL    . CHEST TUBE INSERTION Left 12/18/2016   Procedure: CHEST TUBE INSERTION;  Surgeon: Brianna Lewandowsky, MD;  Location: [facility] ORS;  Service: General;  Laterality: Left;  . CHOLECYSTECTOMY    . PORTACATH  PLACEMENT Left 12/18/2016   Procedure: INSERTION PORT-A-CATH;  Surgeon: Brianna Lewandowsky, MD;  Location: [facility] ORS;  Service: General;  Laterality: Left;  . TONSILLECTOMY      Prior to Admission medications   Medication Sig Start Date End Date Taking? Authorizing Provider  apixaban (ELIQUIS) 5 MG TABS tablet Take 1 tablet (5 mg total) by mouth 2 (two) times daily. 11/16/16  Yes Brianna Basta, MD  cetirizine (ZYRTEC) 10 MG tablet Take 10 mg by mouth daily as needed for allergies.   Yes [provider]  cholecalciferol (VITAMIN D) 1000 units tablet Take 1,000 Units by mouth daily.   Yes [provider]  diltiazem (CARDIZEM CD) 180 MG 24 hr capsule Take 1 capsule (180 mg total) by mouth daily. 12/15/16  Yes Brianna Mire, NP  furosemide (LASIX) 20 MG tablet Take 1 tablet by mouth daily as needed. For EDEMA 10/07/16  Yes [provider]  HYDROcodone-acetaminophen (NORCO) 5-325 MG tablet Take 1 tablet by mouth every 4 (four) hours as needed for moderate pain. 12/26/16  Yes Brianna Lewandowsky, MD  hydrOXYzine (ATARAX/VISTARIL) 25 MG tablet Take 1 tablet (25 mg total) by mouth every 6 (six) hours as needed. For itching. 12/12/16  Yes Brianna Mire, NP  KLOR-CON M20 20 MEQ tablet Take 40 mEq by mouth 2 (two) times daily. 12/12/16  Yes [provider]  lidocaine-prilocaine (EMLA) cream Apply to affected area once 12/17/16  Yes [name], [name], MD  nystatin-triamcinolone ointment [facility])  Apply 1 application topically 2 (two) times daily. 12/30/16  Yes Brianna Server, MD  omeprazole (PRILOSEC) 20 MG capsule Take 20 mg by mouth daily.   Yes [provider]  ondansetron (ZOFRAN) 8 MG tablet Take 1 tablet (8 mg total) by mouth 2 (two) times daily as needed for refractory nausea / vomiting. 12/17/16  Yes Brianna Huger, MD  prochlorperazine (COMPAZINE) 10 MG tablet Take 1 tablet (10 mg total) by mouth every 6 (six) hours as needed (Nausea or vomiting).  12/17/16  Yes Brianna Huger, MD  amiodarone (PACERONE) 200 MG tablet Take 200 mg by mouth 2 (two) times daily. 12/19/16   [provider]    Allergies Sulfa antibiotics and Levaquin [levofloxacin]  Family History  Problem Relation Age of Onset  . Hypertension Father   . Heart disease Father   . Heart attack Father   . Congestive Heart Failure Mother   . Stroke Mother   . Atrial fibrillation Sister     Social History Social History  Substance Use Topics  . Smoking status: Former Smoker    Packs/day: 1.00    Years: 68.00    Quit date: 11/06/2016  . Smokeless tobacco: Never Used  . Alcohol use No    Review of Systems  Constitutional: No fever/chills Eyes: No visual changes. ENT: No sore throat. Cardiovascular: Denies chest pain. Respiratory: Denies shortness of breath. Gastrointestinal: No abdominal pain.  No nausea, no vomiting.  No diarrhea.  No constipation. Genitourinary: Negative for dysuria. Musculoskeletal: Negative for back pain. Skin: Negative for rash. Neurological: Negative for headaches, focal weakness   ____________________________________________   PHYSICAL EXAM:  VITAL SIGNS: ED Triage Vitals  Enc Vitals Group     BP 12/31/16 1457 (!) 124/51     Pulse Rate 12/31/16 1457 90     Resp 12/31/16 1457 (!) 22     Temp 12/31/16 1457 98.4 F (36.9 C)     Temp Source 12/31/16 1457 Oral     SpO2 12/31/16 1457 97 %     Weight 12/31/16 1457 164 lb (74.4 kg)     Height 12/31/16 1457 5\' 3"  (1.6 m)     Head Circumference --      Peak Flow --      Pain Score 12/31/16 1456 8     Pain Loc --      Pain Edu? --      Excl. in [place]? --     Constitutional: Alert and oriented. Well appearing and in no acute distress. Eyes: Conjunctivae are normal.  Head: Atraumatic. Nose: No congestion/rhinnorhea. Mouth/Throat: Mucous membranes are moist.  Oropharynx non-erythematous. Neck: No stridor. Cardiovascular: Normal rate, regular rhythm. Grossly normal  heart sounds.  Good peripheral circulation. Respiratory: Normal respiratory effort.  No retractions. Lungs CTAB.the area around the chest tube site is red there is a little bit of pussy discharge. Gastrointestinal: Soft and nontender. No distention. No abdominal bruits. No CVA tenderness. Musculoskeletal: No lower extremity tenderness nor edema.  No joint effusions. Neurologic:  Normal speech and language. No gross focal neurologic deficits are appreciated. . Skin:  Skin is warm, dry and intact. No rash notedExcept for C chest. Psychiatric: Mood and affect are normal. Speech and behavior are normal.  ____________________________________________   LABS (all labs ordered are listed, but only abnormal results are displayed)  Labs Reviewed  CBC - Abnormal; Notable for the following:       Result Value   Hemoglobin 11.0 (*)    HCT 32.3 (*)  RDW 15.7 (*)    All other components within normal limits  COMPREHENSIVE METABOLIC PANEL - Abnormal; Notable for the following:    Sodium 133 (*)    Chloride 97 (*)    Glucose, Bld 113 (*)    Calcium 8.2 (*)    Total Protein 6.0 (*)    Albumin 2.5 (*)    All other components within normal limits  TROPONIN I  DIFFERENTIAL   ____________________________________________  EKG   ____________________________________________  RADIOLOGY  radiologist report increased size of effusion pneumonitis ____________________________________________   PROCEDURES  Procedure(s) performed:  Procedures  Critical Care performed:   ____________________________________________   INITIAL IMPRESSION / ASSESSMENT AND PLAN / ED COURSE  patient has more chest x-ray findings and apparent cellulitis on the wall around the site of the chest tube at this point I think the safest thing to do would be admit her especially since her blood pressure today is considerably lower than its been on other days even at the beginning of the month      ____________________________________________   FINAL CLINICAL IMPRESSION(S) / ED DIAGNOSES  Final diagnoses:  Cellulitis of trunk, unspecified site of trunk  Pneumonitis      NEW MEDICATIONS STARTED DURING THIS VISIT:  New Prescriptions   No medications on file     Note:  This document was prepared using Dragon voice recognition software and may include unintentional dictation errors.    Nena Polio, MD 12/31/16 [phone]

## 2017-01-01 ENCOUNTER — Encounter: Payer: Self-pay | Admitting: *Deleted

## 2017-01-01 ENCOUNTER — Other Ambulatory Visit: Payer: Self-pay | Admitting: Oncology

## 2017-01-01 ENCOUNTER — Ambulatory Visit: Payer: Self-pay | Admitting: Cardiothoracic Surgery

## 2017-01-01 DIAGNOSIS — R531 Weakness: Secondary | ICD-10-CM

## 2017-01-01 DIAGNOSIS — Z87891 Personal history of nicotine dependence: Secondary | ICD-10-CM

## 2017-01-01 DIAGNOSIS — Z9221 Personal history of antineoplastic chemotherapy: Secondary | ICD-10-CM

## 2017-01-01 DIAGNOSIS — Z8781 Personal history of (healed) traumatic fracture: Secondary | ICD-10-CM

## 2017-01-01 DIAGNOSIS — I4891 Unspecified atrial fibrillation: Secondary | ICD-10-CM

## 2017-01-01 DIAGNOSIS — Z8701 Personal history of pneumonia (recurrent): Secondary | ICD-10-CM | POA: Diagnosis not present

## 2017-01-01 DIAGNOSIS — R5383 Other fatigue: Secondary | ICD-10-CM | POA: Diagnosis not present

## 2017-01-01 DIAGNOSIS — C3492 Malignant neoplasm of unspecified part of left bronchus or lung: Secondary | ICD-10-CM

## 2017-01-01 DIAGNOSIS — Z8711 Personal history of peptic ulcer disease: Secondary | ICD-10-CM

## 2017-01-01 DIAGNOSIS — R21 Rash and other nonspecific skin eruption: Secondary | ICD-10-CM

## 2017-01-01 DIAGNOSIS — E871 Hypo-osmolality and hyponatremia: Secondary | ICD-10-CM | POA: Diagnosis not present

## 2017-01-01 DIAGNOSIS — R079 Chest pain, unspecified: Secondary | ICD-10-CM | POA: Diagnosis present

## 2017-01-01 DIAGNOSIS — J90 Pleural effusion, not elsewhere classified: Secondary | ICD-10-CM | POA: Diagnosis not present

## 2017-01-01 DIAGNOSIS — J449 Chronic obstructive pulmonary disease, unspecified: Secondary | ICD-10-CM

## 2017-01-01 DIAGNOSIS — Z79899 Other long term (current) drug therapy: Secondary | ICD-10-CM

## 2017-01-01 DIAGNOSIS — L03319 Cellulitis of trunk, unspecified: Secondary | ICD-10-CM

## 2017-01-01 LAB — BASIC METABOLIC PANEL
ANION GAP: 10 (ref 5–15)
BUN: 13 mg/dL (ref 6–20)
CHLORIDE: 98 mmol/L — AB (ref 101–111)
CO2: 25 mmol/L (ref 22–32)
Calcium: 7.6 mg/dL — ABNORMAL LOW (ref 8.9–10.3)
Creatinine, Ser: 0.68 mg/dL (ref 0.44–1.00)
GFR calc Af Amer: >60 mL/min (ref >60)
GFR calc non Af Amer: >60 mL/min (ref >60)
GLUCOSE: 154 mg/dL — AB (ref 65–99)
POTASSIUM: 3.7 mmol/L (ref 3.5–5.1)
SODIUM: 133 mmol/L — AB (ref 135–145)

## 2017-01-01 MED ORDER — DOCUSATE SODIUM 100 MG PO CAPS
100.0000 mg | ORAL_CAPSULE | Freq: Two times a day (BID) | ORAL | Status: DC
  Administered 2017-01-01 – 2017-01-06 (×8): 100 mg via ORAL
  Filled 2017-01-01 (×11): qty 1

## 2017-01-01 MED ORDER — PANTOPRAZOLE SODIUM 40 MG PO TBEC: 40.0000 mg | DELAYED_RELEASE_TABLET | Freq: Every day | ORAL | Status: DC

## 2017-01-01 MED ORDER — SODIUM CHLORIDE 0.9% FLUSH
3.0000 mL | Freq: Two times a day (BID) | INTRAVENOUS | Status: DC
  Administered 2017-01-01 – 2017-01-02 (×4): 3 mL via INTRAVENOUS

## 2017-01-01 MED ORDER — FLUCONAZOLE 100 MG PO TABS
100.0000 mg | ORAL_TABLET | Freq: Every day | ORAL | Status: DC
  Administered 2017-01-01 – 2017-01-07 (×7): 100 mg via ORAL
  Filled 2017-01-01 (×4): qty 2
  Filled 2017-01-01: qty 1
  Filled 2017-01-01 (×2): qty 2

## 2017-01-01 MED ORDER — VITAMIN D 1000 UNITS PO TABS
1000.0000 [IU] | ORAL_TABLET | Freq: Every day | ORAL | Status: DC
  Administered 2017-01-01 – 2017-01-08 (×7): 1000 [IU] via ORAL
  Filled 2017-01-01 (×8): qty 1

## 2017-01-01 MED ORDER — SODIUM CHLORIDE 0.9% FLUSH: 3.0000 mL | INTRAVENOUS | Status: DC | PRN

## 2017-01-01 MED ORDER — HYDROCODONE-ACETAMINOPHEN 5-325 MG PO TABS
1.0000 | ORAL_TABLET | ORAL | Status: DC | PRN
Start: 2017-01-01 — End: 2017-01-04
  Administered 2017-01-01 – 2017-01-02 (×3): 1 via ORAL
  Filled 2017-01-01 (×4): qty 1

## 2017-01-01 MED ORDER — PANTOPRAZOLE SODIUM 40 MG PO TBEC
40.0000 mg | DELAYED_RELEASE_TABLET | Freq: Every day | ORAL | Status: DC
  Administered 2017-01-01 – 2017-01-07 (×7): 40 mg via ORAL
  Filled 2017-01-01 (×8): qty 1

## 2017-01-01 MED ORDER — PREMIER PROTEIN SHAKE
11.0000 [oz_av] | Freq: Two times a day (BID) | ORAL | Status: DC
  Administered 2017-01-02 – 2017-01-05 (×2): 11 [oz_av] via ORAL

## 2017-01-01 MED ORDER — ACETAMINOPHEN 325 MG PO TABS
650.0000 mg | ORAL_TABLET | Freq: Four times a day (QID) | ORAL | Status: DC | PRN
  Administered 2017-01-06: 650 mg via ORAL
  Filled 2017-01-01: qty 2

## 2017-01-01 MED ORDER — FUROSEMIDE 20 MG PO TABS: 20.0000 mg | ORAL_TABLET | Freq: Every day | ORAL | Status: DC | PRN

## 2017-01-01 MED ORDER — FUROSEMIDE 10 MG/ML IJ SOLN: 40.0000 mg | Freq: Once | INTRAMUSCULAR | Status: DC

## 2017-01-01 MED ORDER — PREDNISONE 20 MG PO TABS
20.0000 mg | ORAL_TABLET | Freq: Every day | ORAL | Status: DC
  Administered 2017-01-01: 11:00:00 20 mg via ORAL
  Filled 2017-01-01 (×2): qty 1

## 2017-01-01 MED ORDER — ONDANSETRON HCL 4 MG PO TABS: 8.0000 mg | ORAL_TABLET | Freq: Two times a day (BID) | ORAL | Status: DC | PRN

## 2017-01-01 MED ORDER — DEXTROSE 5 % IV SOLN
1.0000 g | INTRAVENOUS | Status: DC
  Administered 2017-01-01: 16:00:00 1 g via INTRAVENOUS
  Filled 2017-01-01 (×2): qty 10

## 2017-01-01 MED ORDER — LORATADINE 10 MG PO TABS
10.0000 mg | ORAL_TABLET | Freq: Every day | ORAL | Status: DC
  Administered 2017-01-01 – 2017-01-08 (×8): 10 mg via ORAL
  Filled 2017-01-01 (×8): qty 1

## 2017-01-01 MED ORDER — ADULT MULTIVITAMIN W/MINERALS CH
1.0000 | ORAL_TABLET | Freq: Every day | ORAL | Status: DC
  Administered 2017-01-02 – 2017-01-08 (×7): 1 via ORAL
  Filled 2017-01-01 (×7): qty 1

## 2017-01-01 MED ORDER — POTASSIUM CHLORIDE CRYS ER 20 MEQ PO TBCR
40.0000 meq | EXTENDED_RELEASE_TABLET | Freq: Two times a day (BID) | ORAL | Status: DC
  Administered 2017-01-01 – 2017-01-04 (×8): 40 meq via ORAL
  Filled 2017-01-01 (×8): qty 2

## 2017-01-01 MED ORDER — AZITHROMYCIN 500 MG PO TABS
250.0000 mg | ORAL_TABLET | Freq: Every day | ORAL | Status: DC
  Filled 2017-01-01: qty 1

## 2017-01-01 MED ORDER — FLEET ENEMA 7-19 GM/118ML RE ENEM: 1.0000 | ENEMA | Freq: Once | RECTAL | Status: DC | PRN

## 2017-01-01 MED ORDER — ACETAMINOPHEN 650 MG RE SUPP: 650.0000 mg | Freq: Four times a day (QID) | RECTAL | Status: DC | PRN

## 2017-01-01 MED ORDER — NYSTATIN 100000 UNIT/GM EX POWD
Freq: Three times a day (TID) | CUTANEOUS | Status: AC
  Administered 2017-01-01 – 2017-01-07 (×15): via TOPICAL
  Filled 2017-01-01: qty 15

## 2017-01-01 MED ORDER — SODIUM CHLORIDE 0.9 % IV SOLN: 250.0000 mL | INTRAVENOUS | Status: DC | PRN

## 2017-01-01 MED ORDER — SODIUM CHLORIDE 0.9% FLUSH
3.0000 mL | Freq: Two times a day (BID) | INTRAVENOUS | Status: DC
  Administered 2017-01-01 – 2017-01-02 (×4): 3 mL via INTRAVENOUS

## 2017-01-01 MED ORDER — LIDOCAINE-PRILOCAINE 2.5-2.5 % EX CREA
TOPICAL_CREAM | Freq: Every day | CUTANEOUS | Status: DC
  Administered 2017-01-03: 10:00:00 via TOPICAL
  Filled 2017-01-01 (×2): qty 5

## 2017-01-01 MED ORDER — TEMAZEPAM 15 MG PO CAPS
15.0000 mg | ORAL_CAPSULE | Freq: Every day | ORAL | Status: DC
  Administered 2017-01-01 – 2017-01-07 (×8): 15 mg via ORAL
  Filled 2017-01-01 (×8): qty 1

## 2017-01-01 MED ORDER — BUDESONIDE 0.5 MG/2ML IN SUSP
0.5000 mg | Freq: Two times a day (BID) | RESPIRATORY_TRACT | Status: DC
  Administered 2017-01-01 – 2017-01-08 (×13): 0.5 mg via RESPIRATORY_TRACT
  Filled 2017-01-01 (×14): qty 2

## 2017-01-01 MED ORDER — SENNA 8.6 MG PO TABS
1.0000 | ORAL_TABLET | Freq: Two times a day (BID) | ORAL | Status: DC
  Administered 2017-01-01 – 2017-01-06 (×10): 8.6 mg via ORAL
  Filled 2017-01-01 (×11): qty 1

## 2017-01-01 MED ORDER — PROCHLORPERAZINE MALEATE 10 MG PO TABS
10.0000 mg | ORAL_TABLET | Freq: Four times a day (QID) | ORAL | Status: DC | PRN
  Filled 2017-01-01: qty 1

## 2017-01-01 MED ORDER — IPRATROPIUM-ALBUTEROL 0.5-2.5 (3) MG/3ML IN SOLN
3.0000 mL | Freq: Four times a day (QID) | RESPIRATORY_TRACT | Status: AC
  Administered 2017-01-01 (×2): 3 mL via RESPIRATORY_TRACT
  Filled 2017-01-01 (×3): qty 3

## 2017-01-01 MED ORDER — APIXABAN 5 MG PO TABS
5.0000 mg | ORAL_TABLET | Freq: Two times a day (BID) | ORAL | Status: DC
  Administered 2017-01-01: 5 mg via ORAL
  Filled 2017-01-01: qty 1

## 2017-01-01 MED ORDER — HYDROXYZINE HCL 25 MG PO TABS
25.0000 mg | ORAL_TABLET | Freq: Four times a day (QID) | ORAL | Status: DC | PRN
  Filled 2017-01-01: qty 1

## 2017-01-01 MED ORDER — PROMETHAZINE HCL 12.5 MG PO TABS
12.5000 mg | ORAL_TABLET | Freq: Four times a day (QID) | ORAL | Status: DC | PRN
  Filled 2017-01-01: qty 1

## 2017-01-01 MED ORDER — BISACODYL 10 MG RE SUPP: 10.0000 mg | Freq: Every day | RECTAL | Status: DC | PRN

## 2017-01-01 MED ORDER — AZITHROMYCIN 500 MG PO TABS
500.0000 mg | ORAL_TABLET | Freq: Every day | ORAL | Status: AC
  Administered 2017-01-01: 16:00:00 500 mg via ORAL
  Filled 2017-01-01: qty 1

## 2017-01-01 MED ORDER — METHYLPREDNISOLONE SODIUM SUCC 125 MG IJ SOLR
125.0000 mg | Freq: Once | INTRAMUSCULAR | Status: AC
  Administered 2017-01-01: 125 mg via INTRAVENOUS
  Filled 2017-01-01: qty 2

## 2017-01-01 MED ORDER — DILTIAZEM HCL ER COATED BEADS 180 MG PO CP24
180.0000 mg | ORAL_CAPSULE | Freq: Every day | ORAL | Status: DC
  Filled 2017-01-01 (×2): qty 1

## 2017-01-01 MED ORDER — AMIODARONE HCL 200 MG PO TABS
200.0000 mg | ORAL_TABLET | Freq: Two times a day (BID) | ORAL | Status: DC
  Administered 2017-01-01 – 2017-01-04 (×7): 200 mg via ORAL
  Filled 2017-01-01 (×8): qty 1

## 2017-01-01 MED ORDER — CLONAZEPAM 0.5 MG PO TBDP
1.0000 mg | ORAL_TABLET | Freq: Two times a day (BID) | ORAL | Status: DC | PRN
  Administered 2017-01-07: 1 mg via ORAL
  Filled 2017-01-01: qty 8
  Filled 2017-01-01: qty 2

## 2017-01-01 NOTE — Consult Note (Signed)
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  INTERVAL HISTORY: Patient is an 83 year old female who is actively receiving carboplatinum and Taxol for the above stated malignancy. Her last treatment was this past Thursday. She was admitted to the hospital over the weekend with worsening chest pain and concern for an infected Pleurx catheter.atient also had a significant rash underneath her breasts and groin area. Chest x-ray revealed increased left pleural effusion. Patient currently feels well and is nearly back to her baseline. She has no neurologic complaints. She denies any fevers.She has a fair appetite, but denies weight loss. She denies any cough or hemoptysis. She has no nausea, vomiting, constipation, or diarrhea. She has no urinary complaints. Patient offers no further specific complaints today.  REVIEW OF SYSTEMS:   Review of Systems  Constitutional: Positive for malaise/fatigue. Negative for fever and weight loss.  Respiratory: Negative for cough, hemoptysis and shortness of breath.   Cardiovascular: Positive for chest pain. Negative for leg swelling.  Gastrointestinal: Negative.  Negative for abdominal pain, nausea and vomiting.  Genitourinary: Negative.   Musculoskeletal: Negative.   Skin: Positive for rash.  Neurological: Positive for weakness.  Psychiatric/Behavioral: Negative.  The patient is not nervous/anxious.     As per HPI. Otherwise, a complete review of systems is negative.  PAST MEDICAL HISTORY: Past Medical History:  Diagnosis Date  . COPD (chronic obstructive pulmonary disease) ([place])   . GU (gastric peptic ulcer)   . Lung cancer ([place])   . Malignant pleural effusion   .  PAF (paroxysmal atrial fibrillation) ([place]) 10/2016   a. 10/2016 post thoracentesis ; b. 10/2016 Echo: EF 65-70%, mild LVH;  c. Amio/Eliquis initiated (CHA2DSVASc = 3).  . Pneumonia   . Wrist fracture 1993   bilateral    PAST SURGICAL HISTORY: Past Surgical History:  Procedure Laterality Date  . ABDOMINAL HYSTERECTOMY    . CATARACT EXTRACTION W/ INTRAOCULAR LENS  IMPLANT, BILATERAL    . CHEST TUBE INSERTION Left 12/18/2016   Procedure: CHEST TUBE INSERTION;  Surgeon: [name], MD;  Location: [facility] ORS;  Service: General;  Laterality: Left;  . CHOLECYSTECTOMY    . PORTACATH PLACEMENT Left 12/18/2016   Procedure: INSERTION PORT-A-CATH;  Surgeon: [name], MD;  Location: [facility] ORS;  Service: General;  Laterality: Left;  . TONSILLECTOMY      FAMILY HISTORY: Family History  Problem Relation Age of Onset  . Hypertension Father   . Heart disease Father   . Heart attack Father   . Congestive Heart Failure Mother   . Stroke Mother   . Atrial fibrillation Sister     ADVANCED DIRECTIVES (Y/N):  @ADVDIR @  HEALTH MAINTENANCE: Social History  Substance Use Topics  . Smoking status: Former Smoker    Packs/day: 1.00    Years: 68.00    Quit date: 11/06/2016  . Smokeless tobacco: Never Used  . Alcohol use No     Colonoscopy:  PAP:  Bone density:  Lipid panel:  Allergies  Allergen Reactions  . Sulfa Antibiotics Other (See Comments)    Seizures   . Levaquin [Levofloxacin] Hives and Itching    Current Facility-Administered Medications  Medication Dose Route Frequency Provider Last Rate Last Dose  . 0.9 %  sodium  chloride infusion  250 mL Intravenous PRN Salary, Montell D, MD      . acetaminophen (TYLENOL) tablet 650 mg  650 mg Oral Q6H PRN Salary, Montell D, MD       Or  . acetaminophen (TYLENOL) suppository 650 mg  650 mg Rectal Q6H PRN Salary, Montell D, MD      . amiodarone (PACERONE) tablet 200 mg  200 mg Oral BID Salary, Montell D, MD      . apixaban (ELIQUIS) tablet  5 mg  5 mg Oral BID Salary, Montell D, MD   5 mg at 01/01/17 1125  . bisacodyl (DULCOLAX) suppository 10 mg  10 mg Rectal Daily PRN Salary, Montell D, MD      . budesonide (PULMICORT) nebulizer solution 0.5 mg  0.5 mg Nebulization BID Salary, Montell D, MD      . cholecalciferol (VITAMIN D) tablet 1,000 Units  1,000 Units Oral Daily Salary, Montell D, MD   1,000 Units at 01/01/17 1125  . clonazepam (KLONOPIN) disintegrating tablet 1 mg  1 mg Oral BID PRN Salary, Montell D, MD      . diltiazem (CARDIZEM CD) 24 hr capsule 180 mg  180 mg Oral Daily Salary, Montell D, MD      . docusate sodium (COLACE) capsule 100 mg  100 mg Oral BID Salary, Montell D, MD   100 mg at 01/01/17 1125  . furosemide (LASIX) injection 40 mg  40 mg Intravenous Once Salary, Montell D, MD      . furosemide (LASIX) tablet 20 mg  20 mg Oral Daily PRN Salary, Montell D, MD      . HYDROcodone-acetaminophen (NORCO/VICODIN) 5-325 MG per tablet 1 tablet  1 tablet Oral Q4H PRN Salary, Avel Peace, MD   1 tablet at 01/01/17 0205  . hydrOXYzine (ATARAX/VISTARIL) tablet 25 mg  25 mg Oral Q6H PRN Salary, Montell D, MD      . ipratropium-albuterol (DUONEB) 0.5-2.5 (3) MG/3ML nebulizer solution 3 mL  3 mL Nebulization QID Salary, Montell D, MD   3 mL at 01/01/17 1109  . lidocaine-prilocaine (EMLA) cream   Topical Daily Salary, Montell D, MD      . loratadine (CLARITIN) tablet 10 mg  10 mg Oral Daily Salary, Montell D, MD   10 mg at 01/01/17 1124  . nystatin (MYCOSTATIN/NYSTOP) topical powder   Topical TID Salary, Montell D, MD      . ondansetron (ZOFRAN) tablet 8 mg  8 mg Oral BID PRN Salary, Montell D, MD      . pantoprazole (PROTONIX) EC tablet 40 mg  40 mg Oral Daily Salary, Montell D, MD   40 mg at 01/01/17 1124  . potassium chloride SA (K-DUR,KLOR-CON) CR tablet 40 mEq  40 mEq Oral BID Loney Hering D, MD   40 mEq at 01/01/17 1124  . predniSONE (DELTASONE) tablet 20 mg  20 mg Oral Q breakfast Salary, Montell D, MD   20 mg at 01/01/17  1124  . prochlorperazine (COMPAZINE) tablet 10 mg  10 mg Oral Q6H PRN Salary, Montell D, MD      . promethazine (PHENERGAN) tablet 12.5 mg  12.5 mg Oral Q6H PRN Salary, Montell D, MD      . sodium chloride flush (NS) 0.9 % injection 3 mL  3 mL Intravenous Q12H Salary, Montell D, MD   3 mL at 01/01/17 1128  . sodium chloride flush (NS) 0.9 % injection 3 mL  3 mL Intravenous Q12H Salary, Avel Peace, MD  3 mL at 01/01/17 1128  . sodium chloride flush (NS) 0.9 % injection 3 mL  3 mL Intravenous PRN Salary, Montell D, MD      . sodium phosphate (FLEET) 7-19 GM/118ML enema 1 enema  1 enema Rectal Once PRN Salary, Montell D, MD      . temazepam (RESTORIL) capsule 15 mg  15 mg Oral QHS Salary, Holly Bodily D, MD   15 mg at 01/01/17 0304    OBJECTIVE: Vitals:   01/01/17 0808 01/01/17 1231  BP: (!) 93/55 (!) 105/48  Pulse: 90 (!) 107  Resp: 18 18  Temp:  98 F (36.7 C)  SpO2: 97% 96%     Body mass index is 29.33 kg/m.    ECOG FS:1 - Symptomatic but completely ambulatory  General: Well-developed, well-nourished, no acute distress. Eyes: Pink conjunctiva, anicteric sclera. Lungs: Diminished breath sounds bilaterally. Heart: Regular rate and rhythm. No rubs, murmurs, or gallops. Abdomen: Soft, nontender, nondistended. No organomegaly noted, normoactive bowel sounds. Musculoskeletal: No edema, cyanosis, or clubbing. Neuro: Alert, answering all questions appropriately. Cranial nerves grossly intact. Skin: No rashes or petechiae noted. Psych: Normal affect.    LAB RESULTS:  Lab Results  Component Value Date   NA 133 (L) 01/01/2017   K 3.7 01/01/2017   CL 98 (L) 01/01/2017   CO2 25 01/01/2017   GLUCOSE 154 (H) 01/01/2017   BUN 13 01/01/2017   CREATININE 0.68 01/01/2017   CALCIUM 7.6 (L) 01/01/2017   PROT 6.0 (L) 12/31/2016   ALBUMIN 2.5 (L) 12/31/2016   AST 18 12/31/2016   ALT 14 12/31/2016   ALKPHOS 97 12/31/2016   BILITOT 1.2 12/31/2016   GFRNONAA >60 01/01/2017   GFRAA >60  01/01/2017    Lab Results  Component Value Date   WBC 4.8 12/31/2016   NEUTROABS 4.0 12/31/2016   HGB 11.0 (L) 12/31/2016   HCT 32.3 (L) 12/31/2016   MCV 82.0 12/31/2016   PLT 187 12/31/2016     STUDIES: Dg Chest 1 View  Result Date: 12/14/2016 CLINICAL DATA:  Left thoracentesis . EXAM: CHEST 1 VIEW COMPARISON:  12/08/2016. FINDINGS: Interim left left thoracentesis. Removal of significant amount of left pleural fluid with minimal residual effusion. No pneumothorax. Cardiomegaly again noted. Mild bilateral interstitial prominence. Mild component CHF may be present. Pneumonitis cannot be excluded . IMPRESSION: 1. Left thoracentesis with removal of significant amount left pleural fluid. No evidence of pneumothorax . 2. Cardiomegaly with mild bilateral interstitial prominence. Mild component of CHF view present. Mild pneumonitis cannot be excluded . Electronically Signed   By: Marcello Moores  Register   On: 12/14/2016 14:36   Dg Chest 2 View  Result Date: 12/31/2016 CLINICAL DATA:  83 y/o F; shortness of breath. History of lung cancer. EXAM: CHEST  2 VIEW COMPARISON:  12/26/2016 chest radiographs FINDINGS: Left pleural drain in situ. Left port catheter tip projects over the upper SVC. Increase left-sided pleural effusion. Left hemithorax pleural nodularity also appears increased from prior chest radiograph although this may be projectional due to patient rotation. New opacity in the left mid lung zone may represent a focus of pneumonitis. Mildly enlarged cardiac silhouette. Mild reverse S curvature of the thoracic spine. Right upper quadrant cholecystectomy clips. IMPRESSION: 1. Increased left effusion. Apparent increase in pleural nodularity, possibly due to differences in projection. 2. New opacity in left mid lung zone may represent a focus of pneumonitis. 3. Stable position of left pleural drain and port catheter. Electronically Signed   By: Edgardo Roys.D.  On: 12/31/2016 17:11   Dg  Chest 2 View  Result Date: 12/26/2016 CLINICAL DATA:  Left chest pain.  Pleural effusion. EXAM: CHEST  2 VIEW COMPARISON:  12/18/2016 FINDINGS: Left Port-A-Cath remains in place, unchanged. PleurX drainage catheter again noted at the left base. No pneumothorax. No visible significant effusion. Heart is normal size. There is hyperinflation of the lungs compatible with COPD. Chronic bibasilar densities likely reflect atelectasis or scarring. IMPRESSION: Left PleurX drainage catheter remains in place.  No pneumothorax. Chronic bibasilar atelectasis or scarring. COPD. Electronically Signed   By: Rolm Baptise M.D.   On: 12/26/2016 08:04   Dg Chest 2 View  Result Date: 12/08/2016 CLINICAL DATA:  Shortness of breath. EXAM: CHEST  2 VIEW COMPARISON:  Radiograph of November 12, 2016. FINDINGS: Stable cardiomegaly. Atherosclerosis of thoracic aorta is noted. No pneumothorax is noted. Large left pleural effusion is noted with probable underlying atelectasis or infiltrate. Minimal right basilar subsegmental atelectasis. Bony thorax is unremarkable. IMPRESSION: Aortic atherosclerosis. Large left pleural effusion is stable with probable underlying atelectasis or infiltrate. Electronically Signed   By: Marijo Conception, M.D.   On: 12/08/2016 13:39   Ct Biopsy  Result Date: 12/06/2016 INDICATION: Concern for metastatic lung cancer. Please perform CT-guided biopsy for tissue diagnostic purposes. EXAM: CT-GUIDED BIOPSY OF HYPERMETABOLIC LEFT LATERAL CHEST WALL MASS. COMPARISON:  PET-CT - 11/29/2016; chest CT - 11/13/2016 MEDICATIONS: None. ANESTHESIA/SEDATION: Fentanyl 37.5 mcg IV; Versed 1 mg IV Sedation time: 13 minutes; The patient was continuously monitored during the procedure by the interventional radiology nurse under my direct supervision. CONTRAST:  None COMPLICATIONS: None immediate. PROCEDURE: Informed consent was obtained from the patient following an explanation of the procedure, risks, benefits and alternatives.  The patient understands,agrees and consents for the procedure. All questions were addressed. A time out was performed prior to the initiation of the procedure. The patient was positioned supine, slightly RPO on the CT table and a limited chest CT was performed for procedural planning demonstrating unchanged size and appearance of the hypermetabolic mass involving the lateral aspect of the left tenth rib measuring approximately 3.2 x 2.5 cm (image 40, series 3). The operative site was prepped and draped in the usual sterile fashion. Under sterile conditions and local anesthesia, a 17 gauge coaxial needle was advanced into the peripheral aspect of the nodule. Positioning was confirmed with intermittent CT fluoroscopy and followed by the acquisition of 5 core needle biopsies with an 18 gauge core needle biopsy device. The coaxial needle was removed and superficial hemostasis was achieved with manual compression. Limited post procedural chest CT was negative for pneumothorax or additional complication. A dressing was placed. The patient tolerated the procedure well without immediate postprocedural complication. The patient was escorted to have an upright chest radiograph. IMPRESSION: Technically successful CT guided core needle core biopsy of indeterminate hypermetabolic mass involving the lateral aspect of the left tenth rib. Electronically Signed   By: Sandi Mariscal M.D.   On: 12/06/2016 13:41   Dg Chest Port 1 View  Result Date: 12/18/2016 CLINICAL DATA:  Chest tube placement EXAM: PORTABLE CHEST 1 VIEW COMPARISON:  12/14/2016, 12/08/2016, PET-CT 11/29/2016 FINDINGS: Placement of a left-sided central venous port with the tip projecting over the SVC confluence. Interim insertion of left lower chest tube with poorly visible tip, possibly coiled at the left CP angle. Decreased left pleural effusion or thickening. Patchy atelectasis or infiltrate at the left base. Stable cardiomediastinal silhouette. Questionable tiny  left apical pneumothorax. IMPRESSION: 1. Insertion of left  lower chest drainage tube with poorly visible tip, it is possibly coiled at the left CP angle 2. Decreased left pleural effusion or thickening. Possible tiny left apical pneumothorax. 3. Patchy atelectasis or infiltrate at the left lung base 4. Mild cardiomegaly Electronically Signed   By: Donavan Foil M.D.   On: 12/18/2016 15:34   Dg C-arm 1-60 Min-no Report  Result Date: 12/18/2016 Fluoroscopy was utilized by the requesting physician.  No radiographic interpretation.   US Thoracentesis Asp Pleural Space W/img Guide  Result Date: 12/14/2016 INDICATION: Malignant left pleural effusion. EXAM: ULTRASOUND GUIDED LEFT THORACENTESIS MEDICATIONS: None. COMPLICATIONS: None immediate. PROCEDURE: An ultrasound guided thoracentesis was thoroughly discussed with the patient and questions answered. The benefits, risks, alternatives and complications were also discussed. The patient understands and wishes to proceed with the procedure. Written consent was obtained. Ultrasound was performed to localize and mark an adequate pocket of fluid in the left chest. The area was then prepped and draped in the normal sterile fashion. 1% Lidocaine was used for local anesthesia. Under ultrasound guidance a 6 Fr Safe-T-Centesis catheter was introduced. Thoracentesis was performed. The catheter was removed and a dressing applied. FINDINGS: A total of approximately 1.3 L of amber colored fluid was removed. IMPRESSION: Successful ultrasound guided left thoracentesis yielding 1.3 L of pleural fluid. Electronically Signed   By: Markus Daft M.D.   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  PLAN:    1. Stage IVA squamous cell carcinoma of the left lung: Patient recently initiated palliative chemotherapy using carboplatinum and Taxol. Her most recent infusion was Taxol only on December 28, 2016. Patient has been  instructed to keep her previously scheduled follow-up appointment on January 04, 2017 to continue chemotherapy as planned.  Patient has indicated that if her quality of life is effected negatively or side effects are intolerable, she would likely discontinue treatment. Her PDL-1 level is 90%, therefore immunotherapy may be of benefit in the future. Hospice was previsouly discussed, but the patient is not ready for this at this time.   2. Pleurx catheter: Appreciate thoracic oncology input. Patient reports catheter may be removed in the near future. 3. Rash: Likely yeast infection. Continue nystatin powder as indicated. 4. Hyponatremia: Improving, monitor.   Appreciate consult, call with questions.  [name], MD   01/01/2017 1:30 PM

## 2017-01-01 NOTE — Care Management Note (Signed)
Case Management Note  Patient Details  Name: [name] MRN: [id number] Date of Birth: Nov 07, 1932  Subjective/Objective:  Admitted to [facility] with the diagnosis of chest pain. Lives alone. Daughter is Michel Bickers [phone]), Last seen Dr, Hall Busing in August 2018. Prescriptions are filled at [facility] on [address], [facility] for Pleux catheter that was placed 12/18/16. No skilled facility, No home oxygen. No medical equipment in the home. Takes care of all basic activities of daily living herself, can drive, if needed. No falls. Lost 17 pounds with the diagnosis of lung cancer. Family will transport              Action/Plan: Will continue to follow for discharge plans   Expected Discharge Date:                  Expected Discharge Plan:     In-House Referral:     Discharge planning Services     Post Acute Care Choice:    Choice offered to:     DME Arranged:    DME Agency:     HH Arranged:    [place] Agency:     Status of Service:     If discussed at [employer] [facility] [employer], dates discussed:    Additional Comments:  Shelbie Ammons, RN MSN CCM Care Management [phone] 01/01/2017, 8:41 AM

## 2017-01-01 NOTE — Progress Notes (Signed)
PT Cancellation Note  Patient Details Name: [name] MRN: [id number] DOB: 10/23/1932   Cancelled Treatment:    Reason Eval/Treat Not Completed: Other (comment) (Patient sleeping soundly upon arrival to room.  Family member at bedside requests therapist allow patient to rest and return at later time.  Does state patient has been mobilizing to/from bathroom in room without difficulty or assist.  Will continue efforts next date)   Herbert Marken H. Owens Shark, PT, DPT, NCS 01/01/17, 2:24 PM [phone]

## 2017-01-01 NOTE — Progress Notes (Signed)
Patient ID: [name], female   DOB: 06-14-1932, 83 y.o.   MRN: [id number]  [facility] PROGRESS NOTE  [name] VQQ:[id number] DOB: Sep 10, 1932 DOA: 12/31/2016 PCP: Albina Billet, MD  HPI/Subjective: Patient having a lot of pain and discomfort around the tube. Also having some redness under the right breast groin area and buttock area.  Previously was having drainage around the tube  Objective: Vitals:   01/01/17 0808 01/01/17 1231  BP: (!) 93/55 (!) 105/48  Pulse: 90 (!) 107  Resp: 18 18  Temp:  98 F (36.7 C)  SpO2: 97% 96%    Filed Weights   12/31/16 1457 01/01/17 0223 01/01/17 0500  Weight: 74.4 kg (164 lb) 75 kg (165 lb 7 oz) 75.1 kg (165 lb 9 oz)    ROS: Review of Systems  Constitutional: Negative for chills and fever.  Eyes: Negative for blurred vision.  Respiratory: Positive for shortness of breath. Negative for cough.   Cardiovascular: Positive for chest pain.  Gastrointestinal: Negative for abdominal pain, constipation, diarrhea, nausea and vomiting.  Genitourinary: Negative for dysuria.  Musculoskeletal: Negative for joint pain.  Neurological: Negative for dizziness and headaches.   Exam: Physical Exam  Constitutional: She is oriented to person, place, and time.  HENT:  Nose: No mucosal edema.  Mouth/Throat: No oropharyngeal exudate or posterior oropharyngeal edema.  Eyes: Pupils are equal, round, and reactive to light. Conjunctivae, EOM and lids are normal.  Neck: No JVD present. Carotid bruit is not present. No edema present. No thyroid mass and no thyromegaly present.  Cardiovascular: S1 normal and S2 normal.  Exam reveals no gallop.   No murmur heard. Pulses:      Dorsalis pedis pulses are 2+ on the right side, and 2+ on the left side.  Respiratory: No respiratory distress. She has decreased breath sounds in the left lower field. She has no wheezes. She has no rhonchi. She has no rales.  GI: Soft. Bowel sounds are normal. There is no  tenderness.  Musculoskeletal:       Right ankle: She exhibits no swelling.       Left ankle: She exhibits no swelling.  Lymphadenopathy:    She has no cervical adenopathy.  Neurological: She is alert and oriented to person, place, and time. No cranial nerve deficit.  Skin: Skin is warm. Nails show no clubbing.  Erythema under the right breast, buttock skin folds near the groin and lower abdomen. Area of where tube is anteriorly does show some slight erythema  Psychiatric: She has a normal mood and affect.      Data Reviewed: Basic Metabolic Panel:  Recent Labs Lab 12/28/16 0945 12/31/16 1519 01/01/17 0424  NA 130* 133* 133*  K 3.9 3.9 3.7  CL 95* 97* 98*  CO2 24 27 25   GLUCOSE 122* 113* 154*  BUN 17 16 13   CREATININE 0.88 0.70 0.68  CALCIUM 8.4* 8.2* 7.6*   Liver Function Tests:  Recent Labs Lab 12/28/16 0945 12/31/16 1519  AST 18 18  ALT 13* 14  ALKPHOS 94 97  BILITOT 0.6 1.2  PROT 5.9* 6.0*  ALBUMIN 2.8* 2.5*   CBC:  Recent Labs Lab 12/28/16 0945 12/31/16 1519  WBC 10.2 4.8  NEUTROABS 8.3* 4.0  HGB 11.1* 11.0*  HCT 33.3* 32.3*  MCV 81.5 82.0  PLT 209 187   Cardiac Enzymes:  Recent Labs Lab 12/31/16 1519  TROPONINI <0.03   BNP (last 3 results)  Recent Labs  11/12/16 1439  BNP  88.0      Studies: Dg Chest 2 View  Result Date: 12/31/2016 CLINICAL DATA:  83 y/o F; shortness of breath. History of lung cancer. EXAM: CHEST  2 VIEW COMPARISON:  12/26/2016 chest radiographs FINDINGS: Left pleural drain in situ. Left port catheter tip projects over the upper SVC. Increase left-sided pleural effusion. Left hemithorax pleural nodularity also appears increased from prior chest radiograph although this may be projectional due to patient rotation. New opacity in the left mid lung zone may represent a focus of pneumonitis. Mildly enlarged cardiac silhouette. Mild reverse S curvature of the thoracic spine. Right upper quadrant cholecystectomy clips.  IMPRESSION: 1. Increased left effusion. Apparent increase in pleural nodularity, possibly due to differences in projection. 2. New opacity in left mid lung zone may represent a focus of pneumonitis. 3. Stable position of left pleural drain and port catheter. Electronically Signed   By: Kristine Garbe M.D.   On: 12/31/2016 17:11    Scheduled Meds: . amiodarone  200 mg Oral BID  . apixaban  5 mg Oral BID  . azithromycin  500 mg Oral Daily   Followed by  . [START ON 01/02/2017] azithromycin  250 mg Oral Daily  . budesonide (PULMICORT) nebulizer solution  0.5 mg Nebulization BID  . cholecalciferol  1,000 Units Oral Daily  . diltiazem  180 mg Oral Daily  . docusate sodium  100 mg Oral BID  . fluconazole  100 mg Oral Daily  . ipratropium-albuterol  3 mL Nebulization QID  . lidocaine-prilocaine   Topical Daily  . loratadine  10 mg Oral Daily  . nystatin   Topical TID  . pantoprazole  40 mg Oral Daily  . potassium chloride SA  40 mEq Oral BID  . predniSONE  20 mg Oral Q breakfast  . senna  1 tablet Oral BID  . sodium chloride flush  3 mL Intravenous Q12H  . sodium chloride flush  3 mL Intravenous Q12H  . temazepam  15 mg Oral QHS   Continuous Infusions: . sodium chloride    . cefTRIAXone (ROCEPHIN)  IV      Assessment/Plan:  1. Chest pain related to Pleurex catheter. Family stated that Dr. Genevive Bi is going to take out the catheter tomorrow. 2. Pneumonitis on chest x-ray. Continue oral prednisone and Rocephin and Zithromax. 3. Fungal infection under skin folds. Start oral Diflucan and continue antifungal pads under skin folds. 4. Paroxysmal atrial fibrillation on Eliquis, amiodarone, diltiazem on hold with relative hypotension 5. 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 Follow-up with Dr. Grayland Ormond as outpatient on Thursday 6. Hyponatremia. Continue to monitor  Code Status:     Code Status Orders        Start     Ordered   01/01/17 0405  Do not attempt  resuscitation (DNR)  Continuous    Question Answer Comment  In the event of cardiac or respiratory ARREST Do not call a "code blue"   In the event of cardiac or respiratory ARREST Do not perform Intubation, CPR, defibrillation or ACLS   In the event of cardiac or respiratory ARREST Use medication by any route, position, wound care, and other measures to relive pain and suffering. May use oxygen, suction and manual treatment of airway obstruction as needed for comfort.      01/01/17 0404    Code Status History    Date Active Date Inactive Code Status Order ID Comments User Context   01/01/2017  2:21 AM 01/01/2017  4:04  AM Full Code [id number]  Gorden Harms, MD Inpatient   12/18/2016  4:36 PM 12/19/2016  5:57 PM Full Code [id number]  [name], MD Inpatient   11/12/2016  6:12 PM 11/16/2016  6:18 PM Full Code [id number]  Dustin Flock, MD Inpatient     Family Communication: granddaughter at bedside Disposition Plan: home soon  Consultants:  Cardiothoracic surgery  Oncology  Antibiotics:  Rocephin  Zithromax  Time spent: 58 minutes  Loletha Grayer  [employer]

## 2017-01-01 NOTE — H&P (Signed)
[facility] [place] NAME: [name]    MR#:  [id number]  DATE OF BIRTH:  1932/07/20  DATE OF ADMISSION:  12/31/2016  PRIMARY CARE PHYSICIAN: Albina Billet, MD   REQUESTING/REFERRING PHYSICIAN:   CHIEF COMPLAINT:   Chief Complaint  Patient presents with  . Cough  . Weakness    HISTORY OF PRESENT ILLNESS: [name]  is a 83 y.o. female withBelow past medical history, most notable for recently diagnosed stage IV lung cancer with malignant pleural effusion status post recent Pleurx catheter placement/Mediport on 12/18/2016 followed by Dr. Alverda Skeans, status post chemotherapy 3 days ago on Thursday, presenting with multiple complaints chief among which includes chest pain around insertion site of Pleurx catheter for the last's several days, worse with movement-was thought to have some infection per ED attending, also complains of rash underneath breasts and groin area, also complains of chest pain with deep breathing with associated cough, any emergency room patient noted to have chest x-ray for increased left effusion, increased nodularity, new left lung opacity, left pleural drain in place, EKG benign, sodium 133, chloride 97, CBC unimpressive, hospital service subsequently contacted further evaluation/care. Patient evaluated in the emergency room, daughter at the bedside, patient resting comfortably in bed, anxious on appearance, O2 saturation 90-95% on room air, clinically no evidence for cellulitis around chest to/Pleurx catheter site noted, patient is now being admitted for acute chest pain most likely secondary to underlying squamous cell stage IV lung cancer, cannot exclude possible element of acute on COPD exacerbation, and acute on subacute skin candidiasis.  PAST MEDICAL HISTORY:   Past Medical History:  Diagnosis Date  . COPD (chronic obstructive pulmonary disease) ([place])   . GU (gastric peptic ulcer)   . Lung cancer ([place])   .  Malignant pleural effusion   . PAF (paroxysmal atrial fibrillation) ([place]) 10/2016   a. 10/2016 post thoracentesis ; b. 10/2016 Echo: EF 65-70%, mild LVH;  c. Amio/Eliquis initiated (CHA2DSVASc = 3).  . Pneumonia   . Wrist fracture 1993   bilateral    PAST SURGICAL HISTORY: Past Surgical History:  Procedure Laterality Date  . ABDOMINAL HYSTERECTOMY    . CATARACT EXTRACTION W/ INTRAOCULAR LENS  IMPLANT, BILATERAL    . CHEST TUBE INSERTION Left 12/18/2016   Procedure: CHEST TUBE INSERTION;  Surgeon: [name], MD;  Location: [facility] ORS;  Service: General;  Laterality: Left;  . CHOLECYSTECTOMY    . PORTACATH PLACEMENT Left 12/18/2016   Procedure: INSERTION PORT-A-CATH;  Surgeon: [name], MD;  Location: [facility] ORS;  Service: General;  Laterality: Left;  . TONSILLECTOMY      SOCIAL HISTORY:  Social History  Substance Use Topics  . Smoking status: Former Smoker    Packs/day: 1.00    Years: 68.00    Quit date: 11/06/2016  . Smokeless tobacco: Never Used  . Alcohol use No    FAMILY HISTORY:  Family History  Problem Relation Age of Onset  . Hypertension Father   . Heart disease Father   . Heart attack Father   . Congestive Heart Failure Mother   . Stroke Mother   . Atrial fibrillation Sister     DRUG ALLERGIES:  Allergies  Allergen Reactions  . Sulfa Antibiotics Other (See Comments)    Seizures   . Levaquin [Levofloxacin] Hives and Itching    REVIEW OF SYSTEMS:   CONSTITUTIONAL: No fever,Generalized fatigue/weakness.  EYES: No blurred or double vision.  EARS, NOSE, AND THROAT: No tinnitus  or ear pain.  RESPIRATORY: + cough/shortness of breath, no wheezing or hemoptysis.  CARDIOVASCULAR: + chest pain with deep breathing/coughing, no orthopnea, edema.  GASTROINTESTINAL: No nausea, vomiting, diarrhea or abdominal pain.  GENITOURINARY: No dysuria, hematuria.  ENDOCRINE: No polyuria, nocturia,  HEMATOLOGY: No anemia, easy bruising or bleeding SKIN: + rash under  breasts/groin area, no lesion. MUSCULOSKELETAL: No joint pain or arthritis.   NEUROLOGIC: No tingling, numbness, weakness.  PSYCHIATRY: No anxiety or depression.   MEDICATIONS AT HOME:  Prior to Admission medications   Medication Sig Start Date End Date Taking? Authorizing Provider  apixaban (ELIQUIS) 5 MG TABS tablet Take 1 tablet (5 mg total) by mouth 2 (two) times daily. 11/16/16  Yes [name], MD  cetirizine (ZYRTEC) 10 MG tablet Take 10 mg by mouth daily as needed for allergies.   Yes [provider]  cholecalciferol (VITAMIN D) 1000 units tablet Take 1,000 Units by mouth daily.   Yes [provider]  diltiazem (CARDIZEM CD) 180 MG 24 hr capsule Take 1 capsule (180 mg total) by mouth daily. 12/15/16  Yes [name], NP  furosemide (LASIX) 20 MG tablet Take 1 tablet by mouth daily as needed. For EDEMA 10/07/16  Yes [provider]  HYDROcodone-acetaminophen (NORCO) 5-325 MG tablet Take 1 tablet by mouth every 4 (four) hours as needed for moderate pain. 12/26/16  Yes [name], MD  hydrOXYzine (ATARAX/VISTARIL) 25 MG tablet Take 1 tablet (25 mg total) by mouth every 6 (six) hours as needed. For itching. 12/12/16  Yes [name], NP  KLOR-CON M20 20 MEQ tablet Take 40 mEq by mouth 2 (two) times daily. 12/12/16  Yes [provider]  lidocaine-prilocaine (EMLA) cream Apply to affected area once 12/17/16  Yes [name], [name], MD  nystatin-triamcinolone ointment [facility]) Apply 1 application topically 2 (two) times daily. 12/30/16  Yes [name], MD  omeprazole (PRILOSEC) 20 MG capsule Take 20 mg by mouth daily.   Yes [provider]  ondansetron (ZOFRAN) 8 MG tablet Take 1 tablet (8 mg total) by mouth 2 (two) times daily as needed for refractory nausea / vomiting. 12/17/16  Yes [name], MD  prochlorperazine (COMPAZINE) 10 MG tablet Take 1 tablet (10 mg total) by mouth every 6 (six) hours as needed (Nausea or  vomiting). 12/17/16  Yes [name], MD  amiodarone (PACERONE) 200 MG tablet Take 200 mg by mouth 2 (two) times daily. 12/19/16   [provider]      PHYSICAL EXAMINATION:   VITAL SIGNS: Blood pressure (!) 105/57, pulse 85, temperature 98.4 F (36.9 C), temperature source Oral, resp. rate 18, height 5\' 3"  (1.6 m), weight 74.4 kg (164 lb), SpO2 93 %.  GENERAL:  83 y.o.-year-old patient lying in the bed with no acute distress. Overweight, anxious appearing  EYES: Pupils equal, round, reactive to light and accommodation. No scleral icterus. Extraocular muscles intact.  HEENT: Head atraumatic, normocephalic. Oropharynx and nasopharynx clear.  NECK:  Supple, no jugular venous distention. No thyroid enlargement, no tenderness.  LUNGS:  diminished breath sounds bilaterally-worse at bases, left Pleurx catheter noted. No use of accessory muscles of respiration.  CARDIOVASCULAR: S1, S2 normal. No murmurs, rubs, or gallops.  ABDOMEN: Soft, nontender, nondistended. Bowel sounds present. No organomegaly or mass.  EXTREMITIES:  mild lower extremity edema, cyanosis, or clubbing.  NEUROLOGIC: Cranial nerves II through XII are intact. Muscle strength 5/5 in all extremities. Sensation intact. Gait not checked.  PSYCHIATRIC: The patient is alert and  oriented x 3. anxious.   SKIN: No obvious rash, lesion, or ulcer.   LABORATORY PANEL:   CBC  Recent Labs Lab 12/28/16 0945 12/31/16 1519  WBC 10.2 4.8  HGB 11.1* 11.0*  HCT 33.3* 32.3*  PLT 209 187  MCV 81.5 82.0  MCH 27.2 27.9  MCHC 33.4 34.0  RDW 15.4* 15.7*  LYMPHSABS 0.6* 0.6*  MONOABS 0.4 0.1*  EOSABS 0.8* 0.1  BASOSABS 0.1 0.0   ------------------------------------------------------------------------------------------------------------------  Chemistries   Recent Labs Lab 12/28/16 0945 12/31/16 1519  NA 130* 133*  K 3.9 3.9  CL 95* 97*  CO2 24 27  GLUCOSE 122* 113*  BUN 17 16  CREATININE 0.88 0.70  CALCIUM  8.4* 8.2*  AST 18 18  ALT 13* 14  ALKPHOS 94 97  BILITOT 0.6 1.2   ------------------------------------------------------------------------------------------------------------------ estimated creatinine clearance is 52.4 mL/min (by C-G formula based on SCr of 0.7 mg/dL). ------------------------------------------------------------------------------------------------------------------ No results for input(s): TSH, T4TOTAL, T3FREE, THYROIDAB in the last 72 hours.  Invalid input(s): FREET3   Coagulation profile No results for input(s): INR, PROTIME in the last 168 hours. ------------------------------------------------------------------------------------------------------------------- No results for input(s): DDIMER in the last 72 hours. -------------------------------------------------------------------------------------------------------------------  Cardiac Enzymes  Recent Labs Lab 12/31/16 1519  TROPONINI <0.03   ------------------------------------------------------------------------------------------------------------------ Invalid input(s): POCBNP  ---------------------------------------------------------------------------------------------------------------  Urinalysis No results found for: COLORURINE, APPEARANCEUR, LABSPEC, PHURINE, GLUCOSEU, HGBUR, BILIRUBINUR, KETONESUR, PROTEINUR, UROBILINOGEN, NITRITE, LEUKOCYTESUR   RADIOLOGY: Dg Chest 2 View  Result Date: 12/31/2016 CLINICAL DATA:  83 y/o F; shortness of breath. History of lung cancer. EXAM: CHEST  2 VIEW COMPARISON:  12/26/2016 chest radiographs FINDINGS: Left pleural drain in situ. Left port catheter tip projects over the upper SVC. Increase left-sided pleural effusion. Left hemithorax pleural nodularity also appears increased from prior chest radiograph although this may be projectional due to patient rotation. New opacity in the left mid lung zone may represent a focus of pneumonitis. Mildly enlarged cardiac  silhouette. Mild reverse S curvature of the thoracic spine. Right upper quadrant cholecystectomy clips. IMPRESSION: 1. Increased left effusion. Apparent increase in pleural nodularity, possibly due to differences in projection. 2. New opacity in left mid lung zone may represent a focus of pneumonitis. 3. Stable position of left pleural drain and port catheter. Electronically Signed   By: Kristine Garbe M.D.   On: 12/31/2016 17:11    EKG: Orders placed or performed during the hospital encounter of 12/31/16  . ED EKG  . ED EKG    IMPRESSION AND PLAN: 1 acute chest pain Most likely multifactorial in etiology-suspect primarily due to stage IV squamous cell lung cancer, cannot exclude element of acute on COPD exacerbation, postoperative pain from recent Pleurx catheter placement and probable anxiety Worse with deep breathing/coughing, however patient looks quite comfortable at rest when distracted  Refer to the observation unit, Adult pain protocol initiated, give Solu-Medrol IV 1, short course prednisone to start in the morning, schedule breathing treatments for now, respiratory therapy to see, check ABG, consult oncology for continuity of care, social worker consult for help with disposition planning, Klonopin twice a day when necessary anxiety ACS thought to be unlikely, acute pulmonary embolism felt to be unlikely given pt on Eliquis  2 stage IV squamous cell lung cancer with malignant pleural effusion S/p chemotherapy 3 days ago on Thursday, s/p Mediport/left Pleurx catheter 12/18/2016 - no clinical evidence of cellulitis as previously reported/looks like appropriate postoperative skin changes on exam  Oncology consult as stated above for continuity of care  3 acute on  chronic skin candidiasis Nystatin powder 3 times a day  4 probable acute on COPD exacerbation, mild  Plan of care as stated above  5 chronic paroxysmal atrial fibrillation Stable Continue amiodarone and  Eliquis  6 acute anxiety Continue Vistaril Klonopin when necessary  7 acute insomnia Restoril at bedtime  8 history of peptic ulcer disease PPI daily  Full code Condition stable Prognosis-defer to oncology DVT prophylaxis-on Eliquis Disposition home on tomorrow barring any complications  All the records are reviewed and case discussed with ED provider. Management plans discussed with the patient, family and they are in agreement.  CODE STATUS: Code Status History    Date Active Date Inactive Code Status Order ID Comments User Context   12/18/2016  4:36 PM 12/19/2016  5:57 PM Full Code [id number]  [name], MD Inpatient   11/12/2016  6:12 PM 11/16/2016  6:18 PM Full Code [id number]  Dustin Flock, MD Inpatient       TOTAL TIME TAKING CARE OF THIS PATIENT: 45 minutes.    Avel Peace Salary M.D on 01/01/2017   Between 7am to 6pm - Pager - [phone]  After 6pm go to [email or web address] - password [email or web address] [facility]  Office  [phone]  CC: Primary care physician; Albina Billet, MD   Note: This dictation was prepared with Dragon dictation along with smaller phrase technology. Any transcriptional errors that result from this process are unintentional.

## 2017-01-01 NOTE — Progress Notes (Signed)
Paged and spoke with Dr. Estanislado Pandy about pt code status. Per pt who still makes decisions for herself, she does not want resuscitation if she should stop breathing. Granddaughter was present in the room when pt made the decision. Pt also wanted to set up advance directive while in the hospital. Information for advance directive was given. Dr. Estanislado Pandy report he would change code status.

## 2017-01-01 NOTE — Progress Notes (Signed)
This patient is well known to me. She is undergone a Port-A-Cath and a Pleurx catheter. The Pleurx catheter drained around the tube and it never really was able to be adequately drained. She came into the hospital with increasing pain in her left chest. She had some drainage around the tube and there is some purulent drainage there. There is been no further drainage around the catheter over the last several days. Her chest x-ray shows perhaps a slight increase in the amount of fluid in the left chest but overall looks pretty good. There does appear to be extensive pleural nodularity consistent with her metastatic disease.  Because the catheter is not func and it has some purulent discharge around it along with some mild cellulitis surrounding the catheter I think that it is likely that it would need to be removed. She has some discomfort at the site and that is a little concerning to me. I discussed this with the family. I think that I should remove the catheter. I explained to them that I don't believe the fluid will come back but if it does she could always be offered some percutaneous approach.  After discussion with the patient and the family agree that the catheter should be removed. We will plan on doing that tomorrow.

## 2017-01-01 NOTE — Care Management Obs Status (Signed)
[place] NOTIFICATION   Patient Details  Name: [name] MRN: [id number] Date of Birth: January 29, 1933   Medicare Observation Status Notification Given:  Yes    Shelbie Ammons, RN 01/01/2017, 8:41 AM

## 2017-01-01 NOTE — Progress Notes (Signed)
Patient and family do not want any cardiac resuscitation and intubation and ventilator if need arises. Patient is DNR by code status. Discussed with RN taking care of patient

## 2017-01-01 NOTE — Progress Notes (Signed)
Initial Nutrition Assessment  DOCUMENTATION CODES:   Severe malnutrition in context of acute illness/injury  INTERVENTION:  Recommend liberalizing diet to regular.  Provide Premier Protein po BID, each supplement provides 160 kcal and 30 grams of protein.  Provide multivitamin with minerals daily.  Consider addition of appetite stimulant if medically appropriate.  Encouraged adequate intake of calories and protein at meals. Encouraged patient to choose a source of protein at each meal. Discussed that if she does not end up liking Premier Protein she could add a scoop of protein powder into the milkshakes her family brings her. Discussed goal is to prevent any further unintentional weight loss.  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 Genevive Bi admitted with acute chest pain, acute on chronic skin candidiasis, also found to have pneumonitis.   Spoke with patient at bedside. She reports her appetite has been decreased for a while now. Her family brings her meals during the day but she is only finishing about 50% of meals. She enjoys fresh fruit and soups, but reports her family will bring her whatever she feels like eating. Occasionally she will  just be in the mood for a milkshake so she may have about 50% of a chocolate or peach milkshake. She has not been drinking any oral nutrition supplements yet. She has tried Boost and Ensure before and does not like them. Patient denies any N/V, abdominal pain, or difficulty chewing/swallowing.  Patient reports her UBW was 180 lbs. She reports she has lost approximately 15 lbs (8.3% body weight) over 1.5-2 months, which is significant for time frame.  Medications reviewed and include: azithromycin, vitamin D 1000 units daily, Colace, nystatin topical powder, pantoprazole, potassium chloride 40 mEq BID, prednisone 20 mg daily, senna, ceftriaxone.  Labs reviewed: Sodium 133, Chloride 98.  Nutrition-Focused physical exam completed. Findings are mild-moderate fat depletion (mild depletion of thoracic/lumbar region; moderate depletion of orbital region, upper arm region), mild muscle depletion (mild depletion of temple region, clavicle bone region, clavicle/acromion bone region), and no edema. Difficult to truly assess muscle status in setting of body habitus. Skin tenting noted on exam.  Diet Order:  Diet 2 gram sodium Room service appropriate? Yes; Fluid consistency: Thin Diet NPO time specified  Skin:  Wound (see comment) (MSAD to buttocks, abdomen, chest; closed incision left chest)  Last BM:  12/31/2016  Height:   Ht Readings from Last 1 Encounters:  01/01/17 5\' 3"  (1.6 m)    Weight:   Wt Readings from Last 1 Encounters:  01/01/17 165 lb 9 oz (75.1 kg)    Ideal Body Weight:  52.3 kg  BMI:  Body mass index is 29.33 kg/m.  Estimated Nutritional Needs:   Kcal:  1545-1780 (MSJ x 1.3-1.5)  Protein:  90-115 grams (1.2-1.5 grams/kg)  Fluid:  1.8 L/day (25 ml/kg)  EDUCATION NEEDS:   Education needs addressed  Willey Blade, [place] [address], RD, [place] Office: [phone] Pager: [phone] After Hours/Weekend Pager: [phone]

## 2017-01-02 ENCOUNTER — Observation Stay: Payer: Medicare HMO

## 2017-01-02 ENCOUNTER — Observation Stay: Payer: Medicare HMO | Admitting: Anesthesiology

## 2017-01-02 ENCOUNTER — Encounter: Payer: Self-pay | Admitting: *Deleted

## 2017-01-02 ENCOUNTER — Encounter
Admission: EM | Disposition: A | Discharge status: Skilled Nursing Facility | Payer: Self-pay | Source: Home / Self Care | Attending: Internal Medicine

## 2017-01-02 DIAGNOSIS — L03319 Cellulitis of trunk, unspecified: Secondary | ICD-10-CM | POA: Diagnosis not present

## 2017-01-02 HISTORY — PX: REMOVAL OF PLEURAL DRAINAGE CATHETER: SHX5080

## 2017-01-02 SURGERY — REMOVAL, CLOSED DRAINAGE CATHETER SYSTEM, PLEURAL
Anesthesia: Monitor Anesthesia Care

## 2017-01-02 MED ORDER — PROPOFOL 10 MG/ML IV BOLUS
INTRAVENOUS | Status: AC
  Filled 2017-01-02: qty 20

## 2017-01-02 MED ORDER — FLUCONAZOLE 100 MG PO TABS
100.0000 mg | ORAL_TABLET | Freq: Every day | ORAL | 0 refills | Status: DC
Start: 2017-01-02 — End: 2017-01-08

## 2017-01-02 MED ORDER — LIDOCAINE HCL (PF) 1 % IJ SOLN
INTRAMUSCULAR | Status: AC
  Filled 2017-01-02: qty 30

## 2017-01-02 MED ORDER — ALBUTEROL SULFATE (2.5 MG/3ML) 0.083% IN NEBU
2.5000 mg | INHALATION_SOLUTION | RESPIRATORY_TRACT | Status: DC
  Administered 2017-01-02: 2.5 mg via RESPIRATORY_TRACT
  Filled 2017-01-02: qty 3

## 2017-01-02 MED ORDER — LACTATED RINGERS IV SOLN
INTRAVENOUS | Status: DC | PRN
  Administered 2017-01-02: 09:00:00 via INTRAVENOUS

## 2017-01-02 MED ORDER — PREMIER PROTEIN SHAKE: 11.0000 [oz_av] | Freq: Two times a day (BID) | ORAL | 0 refills | Status: DC

## 2017-01-02 MED ORDER — DOXYCYCLINE HYCLATE 100 MG PO TABS
100.0000 mg | ORAL_TABLET | Freq: Two times a day (BID) | ORAL | Status: DC
  Administered 2017-01-02 – 2017-01-08 (×13): 100 mg via ORAL
  Filled 2017-01-02 (×14): qty 1

## 2017-01-02 MED ORDER — LIDOCAINE HCL 1 % IJ SOLN
INTRAMUSCULAR | Status: DC | PRN
Start: 2017-01-02 — End: 2017-01-02
  Administered 2017-01-02: 8 mL

## 2017-01-02 MED ORDER — FENTANYL CITRATE (PF) 100 MCG/2ML IJ SOLN: 25.0000 ug | INTRAMUSCULAR | Status: DC | PRN

## 2017-01-02 MED ORDER — MIDAZOLAM HCL 2 MG/2ML IJ SOLN
INTRAMUSCULAR | Status: DC | PRN
  Administered 2017-01-02 (×2): 1 mg via INTRAVENOUS

## 2017-01-02 MED ORDER — MORPHINE SULFATE (PF) 2 MG/ML IV SOLN
1.0000 mg | INTRAVENOUS | Status: DC | PRN
  Administered 2017-01-02 (×2): 2 mg via INTRAVENOUS
  Administered 2017-01-03: 1 mg via INTRAVENOUS
  Administered 2017-01-03 – 2017-01-05 (×4): 2 mg via INTRAVENOUS
  Filled 2017-01-02 (×7): qty 1

## 2017-01-02 MED ORDER — PROPOFOL 10 MG/ML IV BOLUS
INTRAVENOUS | Status: DC | PRN
  Administered 2017-01-02: 10 mg via INTRAVENOUS

## 2017-01-02 MED ORDER — FENTANYL CITRATE (PF) 100 MCG/2ML IJ SOLN
INTRAMUSCULAR | Status: DC | PRN
  Administered 2017-01-02: 25 ug via INTRAVENOUS
  Administered 2017-01-02: 50 ug via INTRAVENOUS
  Administered 2017-01-02: 25 ug via INTRAVENOUS

## 2017-01-02 MED ORDER — ONDANSETRON HCL 4 MG/2ML IJ SOLN: 4.0000 mg | Freq: Four times a day (QID) | INTRAMUSCULAR | Status: DC | PRN

## 2017-01-02 MED ORDER — DOXYCYCLINE HYCLATE 100 MG PO TABS: 100.0000 mg | ORAL_TABLET | Freq: Two times a day (BID) | ORAL | 0 refills | Status: DC

## 2017-01-02 MED ORDER — MIDAZOLAM HCL 2 MG/2ML IJ SOLN
INTRAMUSCULAR | Status: AC
  Filled 2017-01-02: qty 2

## 2017-01-02 MED ORDER — ONDANSETRON HCL 4 MG/2ML IJ SOLN: 4.0000 mg | Freq: Once | INTRAMUSCULAR | Status: DC | PRN

## 2017-01-02 MED ORDER — AMIODARONE HCL 200 MG PO TABS: 200.0000 mg | ORAL_TABLET | Freq: Every day | ORAL | Status: DC

## 2017-01-02 MED ORDER — FENTANYL CITRATE (PF) 100 MCG/2ML IJ SOLN
INTRAMUSCULAR | Status: AC
  Filled 2017-01-02: qty 2

## 2017-01-02 MED ORDER — OXYCODONE HCL 5 MG PO TABS
5.0000 mg | ORAL_TABLET | ORAL | Status: DC | PRN
Start: 2017-01-02 — End: 2017-01-03

## 2017-01-02 MED ORDER — DEXTROSE-NACL 5-0.45 % IV SOLN: INTRAVENOUS | Status: DC

## 2017-01-02 MED ORDER — ALBUTEROL SULFATE (2.5 MG/3ML) 0.083% IN NEBU
2.5000 mg | INHALATION_SOLUTION | Freq: Four times a day (QID) | RESPIRATORY_TRACT | Status: DC
  Administered 2017-01-02 – 2017-01-03 (×3): 2.5 mg via RESPIRATORY_TRACT
  Filled 2017-01-02 (×3): qty 3

## 2017-01-02 MED ORDER — NYSTATIN 100000 UNIT/GM EX POWD: CUTANEOUS | 0 refills | Status: DC

## 2017-01-02 SURGICAL SUPPLY — 39 items
BLADE SURG SZ11 CARB STEEL (BLADE) IMPLANT
CANISTER SUCT 1200ML W/VALVE (MISCELLANEOUS) ×2 IMPLANT
CHLORAPREP W/TINT 26ML (MISCELLANEOUS) ×2 IMPLANT
DRAIN CHEST DRY SUCT SGL (MISCELLANEOUS) IMPLANT
DRAPE INCISE IOBAN 66X45 STRL (DRAPES) IMPLANT
DRAPE LAPAROTOMY 77X122 PED (DRAPES) ×2 IMPLANT
DRSG OPSITE POSTOP 3X4 (GAUZE/BANDAGES/DRESSINGS) IMPLANT
ELECT REM PT RETURN 9FT ADLT (ELECTROSURGICAL) ×2
ELECTRODE REM PT RTRN 9FT ADLT (ELECTROSURGICAL) ×1 IMPLANT
GAUZE PACKING 1/4X5YD (GAUZE/BANDAGES/DRESSINGS) ×2 IMPLANT
GAUZE SPONGE 4X4 12PLY STRL (GAUZE/BANDAGES/DRESSINGS) ×2 IMPLANT
GLOVE SURG SYN 7.5  E (GLOVE) ×1
GLOVE SURG SYN 7.5 E (GLOVE) ×1 IMPLANT
GOWN STRL REUS W/ TWL LRG LVL3 (GOWN DISPOSABLE) ×2 IMPLANT
GOWN STRL REUS W/TWL LRG LVL3 (GOWN DISPOSABLE) ×2
KIT PLEURX DRAIN CATH 1000ML (MISCELLANEOUS) ×2 IMPLANT
KIT PLEURX DRAIN CATH 15.5FR (DRAIN) IMPLANT
KIT RM TURNOVER STRD PROC AR (KITS) ×2 IMPLANT
LABEL OR SOLS (LABEL) IMPLANT
MARKER SKIN DUAL TIP RULER LAB (MISCELLANEOUS) ×4 IMPLANT
NEEDLE FILTER BLUNT 18X 1/2SAF (NEEDLE) ×1
NEEDLE FILTER BLUNT 18X1 1/2 (NEEDLE) ×1 IMPLANT
PACK BASIN MINOR ARMC (MISCELLANEOUS) ×2 IMPLANT
STRIP CLOSURE SKIN 1/2X4 (GAUZE/BANDAGES/DRESSINGS) IMPLANT
SUCTION FRAZIER HANDLE 10FR (MISCELLANEOUS)
SUCTION TUBE FRAZIER 10FR DISP (MISCELLANEOUS) IMPLANT
SUT ETHILON 3-0 FS-10 30 BLK (SUTURE)
SUT ETHILON 4-0 (SUTURE)
SUT ETHILON 4-0 FS2 18XMFL BLK (SUTURE)
SUT SILK 1 SH (SUTURE) IMPLANT
SUT VIC AB 0 SH 27 (SUTURE) IMPLANT
SUT VIC AB 2-0 SH 27 (SUTURE) ×1
SUT VIC AB 2-0 SH 27XBRD (SUTURE) ×1 IMPLANT
SUT VIC AB 3-0 SH 27 (SUTURE)
SUT VIC AB 3-0 SH 27X BRD (SUTURE) IMPLANT
SUTURE EHLN 3-0 FS-10 30 BLK (SUTURE) IMPLANT
SUTURE ETHLN 4-0 FS2 18XMF BLK (SUTURE) IMPLANT
SYR 30ML LL (SYRINGE) ×2 IMPLANT
TAPE CLOTH 3X10 WHT NS LF (GAUZE/BANDAGES/DRESSINGS) ×2 IMPLANT

## 2017-01-02 NOTE — Progress Notes (Signed)
Patient ID: [name], female   DOB: 07-Nov-1932, 83 y.o.   MRN: [id number]  Patient was in the process of being discharged and went around the bed to grab her phone charger and tripped over her shoe. She fell and landed on her left wrist and she's having good amount of pain there. Also hit head and has a little bruise on the face.  Since the patient is on Eliquis, we will get a stat CT scan of the head. The patient as severe pain left wrist get an x-ray of the left wrist stat.  Unclear if I'll be able to discharge the patient this evening.  Dr. Loletha Grayer.

## 2017-01-02 NOTE — Anesthesia Postprocedure Evaluation (Signed)
Anesthesia Post Note  Patient: [name]  Procedure(s) Performed: REMOVAL OF PLEURAL DRAINAGE CATHETER (N/A )  Patient location during evaluation: PACU Anesthesia Type: MAC and General Level of consciousness: awake Pain management: pain level controlled Vital Signs Assessment: post-procedure vital signs reviewed and stable Respiratory status: nonlabored ventilation Cardiovascular status: stable Anesthetic complications: no     Last Vitals:  Vitals:   01/02/17 1000 01/02/17 1030  BP: 90/61   Pulse: 88   Resp: 15   Temp:  (!) 36.1 C  SpO2: 97%     Last Pain:  Vitals:   01/02/17 1203  TempSrc:   PainSc: 0-No pain                 VAN STAVEREN,Meribeth Vitug

## 2017-01-02 NOTE — Progress Notes (Signed)
Patient fell while dressing for discharge. Dr Leslye Peer and family notified and orders placed by MD.

## 2017-01-02 NOTE — Anesthesia Post-op Follow-up Note (Signed)
Anesthesia QCDR form completed.        

## 2017-01-02 NOTE — Transfer of Care (Signed)
Immediate Anesthesia Transfer of Care Note  Patient: [name]  Procedure(s) Performed: REMOVAL OF PLEURAL DRAINAGE CATHETER (N/A )  Patient Location: PACU  Anesthesia Type:MAC  Level of Consciousness: awake  Airway & Oxygen Therapy: Patient Spontanous Breathing  Post-op Assessment: Report given to RN and Post -op Vital signs reviewed and stable  Post vital signs: Reviewed and stable  Last Vitals:  Vitals:   01/02/17 0547 01/02/17 0821  BP: (!) 114/48 (!) 102/58  Pulse: 95 97  Resp:  16  Temp: 36.7 C   SpO2: 100% 100%    Last Pain:  Vitals:   01/02/17 0547  TempSrc: Oral  PainSc:          Complications: No apparent anesthesia complications

## 2017-01-02 NOTE — Progress Notes (Signed)
PT Cancellation Note  Patient Details Name: [name] MRN: [id number] DOB: October 16, 1932   Cancelled Treatment:    Reason Eval/Treat Not Completed: Medical issues which prohibited therapy (Chart reviewed for re-attempt at patient evaluation.  Noted previous order for discharge; now suspended.  Patient pending L wrist x-ray and stat head CT status post fall in room while dressing for discharge.  Will hold and continue efforts at later date.)   Kenechukwu Eckstein H. Owens Shark, PT, DPT, NCS 01/02/17, 2:20 PM [phone]

## 2017-01-02 NOTE — Anesthesia Preprocedure Evaluation (Signed)
Anesthesia Evaluation  Patient identified by MRN, date of birth, ID band Patient awake    Reviewed: Allergy & Precautions, NPO status , Patient's Chart, lab work & pertinent test results  Airway Mallampati: II       Dental  (+) Teeth Intact, Partial Upper, Partial Lower   Pulmonary COPD,  COPD inhaler, former smoker,  Lung ca    + decreased breath sounds      Cardiovascular Exercise Tolerance: Poor hypertension, + Orthopnea  + dysrhythmias Atrial Fibrillation  Rhythm:Regular Rate:Normal     Neuro/Psych negative neurological ROS  negative psych ROS   GI/Hepatic Neg liver ROS, PUD,   Endo/Other  Morbid obesity  Renal/GU negative Renal ROS     Musculoskeletal negative musculoskeletal ROS (+)   Abdominal (+) + obese,   Peds  Hematology   Anesthesia Other Findings   Reproductive/Obstetrics                             Anesthesia Physical Anesthesia Plan  ASA: III  Anesthesia Plan: MAC   Post-op Pain Management:    Induction: Intravenous  PONV Risk Score and Plan:   Airway Management Planned: Natural Airway and Nasal Cannula  Additional Equipment:   Intra-op Plan:   Post-operative Plan:   Informed Consent: I have reviewed the patients History and Physical, chart, labs and discussed the procedure including the risks, benefits and alternatives for the proposed anesthesia with the patient or authorized representative who has indicated his/her understanding and acceptance.     Plan Discussed with: CRNA  Anesthesia Plan Comments:         Anesthesia Quick Evaluation

## 2017-01-02 NOTE — Progress Notes (Deleted)
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  INTERVAL HISTORY: Patient returns to clinic today for further evaluation and consideration of cycle 1, day 8 of carboplatinum and Taxol. Taxol only today. Patient had issues with her Pleurx catheter this past week, but otherwise tolerated her treatment well. Her weakness and fatigue have improved. Her rash is improving. She has no neurologic complaints. She does not complain of rib pain today. She has a fair appetite, but denies weight loss. She has no chest pain, shortness of breath, cough, or hemoptysis. She denies any nausea, vomiting, constipation, or diarrhea. She has no urinary complaints. Patient offers no further specific complaints today.  REVIEW OF SYSTEMS:   Review of Systems  Constitutional: Positive for malaise/fatigue. Negative for fever and weight loss.  Respiratory: Negative.  Negative for cough and shortness of breath.   Cardiovascular: Negative.  Negative for chest pain and leg swelling.  Gastrointestinal: Negative.  Negative for abdominal pain.  Genitourinary: Negative.   Musculoskeletal: Positive for back pain.  Skin: Negative.  Negative for itching and rash.  Neurological: Positive for weakness. Negative for sensory change.  Psychiatric/Behavioral: Negative.  The patient is not nervous/anxious.     As per HPI. Otherwise, a complete review of systems is negative.  PAST MEDICAL HISTORY: Past Medical History:  Diagnosis Date  . COPD (chronic obstructive pulmonary disease) ([place])   . GU (gastric peptic ulcer)   . Lung cancer ([place])   . Malignant pleural effusion   . PAF (paroxysmal atrial fibrillation) ([place]) 10/2016   a. 10/2016 post  thoracentesis ; b. 10/2016 Echo: EF 65-70%, mild LVH;  c. Amio/Eliquis initiated (CHA2DSVASc = 3).  . Pneumonia   . Wrist fracture 1993   bilateral    PAST SURGICAL HISTORY: Past Surgical History:  Procedure Laterality Date  . ABDOMINAL HYSTERECTOMY    . CATARACT EXTRACTION W/ INTRAOCULAR LENS  IMPLANT, BILATERAL    . CHEST TUBE INSERTION Left 12/18/2016   Procedure: CHEST TUBE INSERTION;  Surgeon: [name], MD;  Location: [facility] ORS;  Service: General;  Laterality: Left;  . CHOLECYSTECTOMY    . PORTACATH PLACEMENT Left 12/18/2016   Procedure: INSERTION PORT-A-CATH;  Surgeon: [name], MD;  Location: [facility] ORS;  Service: General;  Laterality: Left;  . REMOVAL OF PLEURAL DRAINAGE CATHETER N/A 01/02/2017   Procedure: REMOVAL OF PLEURAL DRAINAGE CATHETER;  Surgeon: [name], MD;  Location: [facility] ORS;  Service: Thoracic;  Laterality: N/A;  . TONSILLECTOMY      FAMILY HISTORY: Family History  Problem Relation Age of Onset  . Hypertension Father   . Heart disease Father   . Heart attack Father   . Congestive Heart Failure Mother   . Stroke Mother   . Atrial fibrillation Sister     ADVANCED DIRECTIVES (Y/N):  N  HEALTH MAINTENANCE: Social History  Substance Use Topics  . Smoking status: Former Smoker    Packs/day: 1.00    Years: 68.00    Quit date: 11/06/2016  . Smokeless tobacco: Never Used  . Alcohol use No     Colonoscopy:  PAP:  Bone density:  Lipid panel:  Allergies  Allergen Reactions  . Sulfa Antibiotics Other (See Comments)    Seizures   . Levaquin [Levofloxacin] Hives  and Itching    No current facility-administered medications for this visit.    Current Outpatient Prescriptions  Medication Sig Dispense Refill  . amiodarone (PACERONE) 200 MG tablet Take 1 tablet (200 mg total) by mouth daily.    Marland Kitchen doxycycline (VIBRA-TABS) 100 MG tablet Take 1 tablet (100 mg total) by mouth every 12 (twelve) hours. 18 tablet 0  . fluconazole (DIFLUCAN) 100 MG  tablet Take 1 tablet (100 mg total) by mouth daily. 6 tablet 0  . nystatin (MYCOSTATIN/NYSTOP) powder Apply three times a day to red skin folds 60 g 0  . protein supplement shake (PREMIER PROTEIN) LIQD Take 325 mLs (11 oz total) by mouth 2 (two) times daily between meals. 60 Can 0   Facility-Administered Medications Ordered in Other Visits  Medication Dose Route Frequency Provider Last Rate Last Dose  . 0.9 %  sodium chloride infusion  250 mL Intravenous PRN Salary, Montell D, MD      . acetaminophen (TYLENOL) tablet 650 mg  650 mg Oral Q6H PRN Salary, Montell D, MD       Or  . acetaminophen (TYLENOL) suppository 650 mg  650 mg Rectal Q6H PRN Salary, Montell D, MD      . albuterol (PROVENTIL) (2.5 MG/3ML) 0.083% nebulizer solution 2.5 mg  2.5 mg Nebulization QID Loletha Grayer, MD   2.5 mg at 01/02/17 1934  . amiodarone (PACERONE) tablet 200 mg  200 mg Oral BID Loney Hering D, MD   200 mg at 01/02/17 2049  . bisacodyl (DULCOLAX) suppository 10 mg  10 mg Rectal Daily PRN Salary, Montell D, MD      . budesonide (PULMICORT) nebulizer solution 0.5 mg  0.5 mg Nebulization BID Salary, Montell D, MD   0.5 mg at 01/02/17 1934  . cholecalciferol (VITAMIN D) tablet 1,000 Units  1,000 Units Oral Daily Salary, Montell D, MD   1,000 Units at 01/01/17 1125  . clonazepam (KLONOPIN) disintegrating tablet 1 mg  1 mg Oral BID PRN Salary, Montell D, MD      . docusate sodium (COLACE) capsule 100 mg  100 mg Oral BID Salary, Montell D, MD   100 mg at 01/02/17 1141  . doxycycline (VIBRA-TABS) tablet 100 mg  100 mg Oral Q12H Loletha Grayer, MD   100 mg at 01/02/17 1216  . fluconazole (DIFLUCAN) tablet 100 mg  100 mg Oral Daily Wieting, Richard, MD   100 mg at 01/02/17 1145  . furosemide (LASIX) tablet 20 mg  20 mg Oral Daily PRN Salary, Montell D, MD      . HYDROcodone-acetaminophen (NORCO/VICODIN) 5-325 MG per tablet 1 tablet  1 tablet Oral Q4H PRN Salary, Montell D, MD   1 tablet at 01/02/17 1349  .  hydrOXYzine (ATARAX/VISTARIL) tablet 25 mg  25 mg Oral Q6H PRN Salary, Montell D, MD      . lidocaine-prilocaine (EMLA) cream   Topical Daily Salary, Montell D, MD      . loratadine (CLARITIN) tablet 10 mg  10 mg Oral Daily Salary, Montell D, MD   10 mg at 01/02/17 1140  . morphine 2 MG/ML injection 1-2 mg  1-2 mg Intravenous Q1H PRN [name], MD   2 mg at 01/02/17 2049  . multivitamin with minerals tablet 1 tablet  1 tablet Oral Daily Loletha Grayer, MD   1 tablet at 01/02/17 1139  . nystatin (MYCOSTATIN/NYSTOP) topical powder   Topical TID Salary, Montell D, MD      . ondansetron (ZOFRAN) injection 4 mg  4 mg Intravenous Q6H PRN [name], MD      . ondansetron [facility]) tablet 8 mg  8 mg Oral BID PRN Salary, Montell D, MD      . oxyCODONE (Oxy IR/ROXICODONE) immediate release tablet 5 mg  5 mg Oral Q4H PRN Wieting, Richard, MD      . pantoprazole (PROTONIX) EC tablet 40 mg  40 mg Oral Daily Salary, Montell D, MD   40 mg at 01/02/17 1140  . potassium chloride SA (K-DUR,KLOR-CON) CR tablet 40 mEq  40 mEq Oral BID Loney Hering D, MD   40 mEq at 01/02/17 2049  . prochlorperazine (COMPAZINE) tablet 10 mg  10 mg Oral Q6H PRN Salary, Montell D, MD      . promethazine (PHENERGAN) tablet 12.5 mg  12.5 mg Oral Q6H PRN Salary, Montell D, MD      . protein supplement (PREMIER PROTEIN) liquid  11 oz Oral BID BM Loletha Grayer, MD   11 oz at 01/02/17 1142  . senna (SENOKOT) tablet 8.6 mg  1 tablet Oral BID Loletha Grayer, MD   8.6 mg at 01/02/17 2049  . sodium chloride flush (NS) 0.9 % injection 3 mL  3 mL Intravenous Q12H Salary, Montell D, MD   3 mL at 01/01/17 2224  . sodium chloride flush (NS) 0.9 % injection 3 mL  3 mL Intravenous Q12H Salary, Montell D, MD   3 mL at 01/01/17 2230  . sodium chloride flush (NS) 0.9 % injection 3 mL  3 mL Intravenous PRN Salary, Montell D, MD      . sodium phosphate (FLEET) 7-19 GM/118ML enema 1 enema  1 enema Rectal Once PRN Salary, Montell D, MD      .  temazepam (RESTORIL) capsule 15 mg  15 mg Oral QHS Salary, Montell D, MD   15 mg at 01/02/17 2049    OBJECTIVE: There were no vitals filed for this visit.   There is no height or weight on file to calculate BMI.    ECOG FS:1 - Symptomatic but completely ambulatory  General: Well-developed, well-nourished, no acute distress. Eyes: Pink conjunctiva, anicteric sclera. Lungs: Clear to auscultation bilaterally. Heart: Regular rate and rhythm. No rubs, murmurs, or gallops. Abdomen: Soft, nontender, nondistended. No organomegaly noted, normoactive bowel sounds. Musculoskeletal: No edema, cyanosis, or clubbing. Neuro: Alert, answering all questions appropriately. Cranial nerves grossly intact. Skin: Maculopapular rash noted, Improved. Psych: Normal affect.    LAB RESULTS:  Lab Results  Component Value Date   NA 133 (L) 01/01/2017   K 3.7 01/01/2017   CL 98 (L) 01/01/2017   CO2 25 01/01/2017   GLUCOSE 154 (H) 01/01/2017   BUN 13 01/01/2017   CREATININE 0.68 01/01/2017   CALCIUM 7.6 (L) 01/01/2017   PROT 6.0 (L) 12/31/2016   ALBUMIN 2.5 (L) 12/31/2016   AST 18 12/31/2016   ALT 14 12/31/2016   ALKPHOS 97 12/31/2016   BILITOT 1.2 12/31/2016   GFRNONAA >60 01/01/2017   GFRAA >60 01/01/2017    Lab Results  Component Value Date   WBC 4.8 12/31/2016   NEUTROABS 4.0 12/31/2016   HGB 11.0 (L) 12/31/2016   HCT 32.3 (L) 12/31/2016   MCV 82.0 12/31/2016   PLT 187 12/31/2016     STUDIES: Dg Chest 1 View  Result Date: 12/14/2016 CLINICAL DATA:  Left thoracentesis . EXAM: CHEST 1 VIEW COMPARISON:  12/08/2016. FINDINGS: Interim left left thoracentesis. Removal of significant amount of left pleural fluid with minimal residual effusion. No pneumothorax. Cardiomegaly  again noted. Mild bilateral interstitial prominence. Mild component CHF may be present. Pneumonitis cannot be excluded . IMPRESSION: 1. Left thoracentesis with removal of significant amount left pleural fluid. No evidence of  pneumothorax . 2. Cardiomegaly with mild bilateral interstitial prominence. Mild component of CHF view present. Mild pneumonitis cannot be excluded . Electronically Signed   By: Marcello Moores  Register   On: 12/14/2016 14:36   Dg Chest 2 View  Result Date: 12/31/2016 CLINICAL DATA:  83 y/o F; shortness of breath. History of lung cancer. EXAM: CHEST  2 VIEW COMPARISON:  12/26/2016 chest radiographs FINDINGS: Left pleural drain in situ. Left port catheter tip projects over the upper SVC. Increase left-sided pleural effusion. Left hemithorax pleural nodularity also appears increased from prior chest radiograph although this may be projectional due to patient rotation. New opacity in the left mid lung zone may represent a focus of pneumonitis. Mildly enlarged cardiac silhouette. Mild reverse S curvature of the thoracic spine. Right upper quadrant cholecystectomy clips. IMPRESSION: 1. Increased left effusion. Apparent increase in pleural nodularity, possibly due to differences in projection. 2. New opacity in left mid lung zone may represent a focus of pneumonitis. 3. Stable position of left pleural drain and port catheter. Electronically Signed   By: Kristine Garbe M.D.   On: 12/31/2016 17:11   Dg Chest 2 View  Result Date: 12/26/2016 CLINICAL DATA:  Left chest pain.  Pleural effusion. EXAM: CHEST  2 VIEW COMPARISON:  12/18/2016 FINDINGS: Left Port-A-Cath remains in place, unchanged. PleurX drainage catheter again noted at the left base. No pneumothorax. No visible significant effusion. Heart is normal size. There is hyperinflation of the lungs compatible with COPD. Chronic bibasilar densities likely reflect atelectasis or scarring. IMPRESSION: Left PleurX drainage catheter remains in place.  No pneumothorax. Chronic bibasilar atelectasis or scarring. COPD. Electronically Signed   By: Rolm Baptise M.D.   On: 12/26/2016 08:04   Dg Chest 2 View  Result Date: 12/08/2016 CLINICAL DATA:  Shortness of breath.  EXAM: CHEST  2 VIEW COMPARISON:  Radiograph of November 12, 2016. FINDINGS: Stable cardiomegaly. Atherosclerosis of thoracic aorta is noted. No pneumothorax is noted. Large left pleural effusion is noted with probable underlying atelectasis or infiltrate. Minimal right basilar subsegmental atelectasis. Bony thorax is unremarkable. IMPRESSION: Aortic atherosclerosis. Large left pleural effusion is stable with probable underlying atelectasis or infiltrate. Electronically Signed   By: Marijo Conception, M.D.   On: 12/08/2016 13:39   Dg Wrist Complete Left  Result Date: 01/02/2017 CLINICAL DATA:  Fall with bruising EXAM: LEFT WRIST - COMPLETE 3+ VIEW COMPARISON:  None. FINDINGS: Diffuse osteopenia. Comminuted, impacted intra-articular distal radius fracture. About 1/2 shaft diameter of dorsal displacement of distal fracture fragment and moderate dorsal angulation. Comminuted, impacted distal radius fracture with mild dorsal angulation. IMPRESSION: 1. Osteopenia 2. Comminuted, impacted intra-articular distal radius fracture with displacement and angulation. 3. Comminuted, slightly angulated distal ulna fracture. Electronically Signed   By: Donavan Foil M.D.   On: 01/02/2017 14:32   Ct Head Wo Contrast  Result Date: 01/02/2017 CLINICAL DATA:  Recent trip and fall with head injury, no neuro deficit, initial encounter EXAM: CT HEAD WITHOUT CONTRAST TECHNIQUE: Contiguous axial images were obtained from the base of the skull through the vertex without intravenous contrast. COMPARISON:  None. FINDINGS: Brain: Mild atrophic changes are noted. No findings to suggest acute hemorrhage, acute infarction or space-occupying mass lesion are noted. Vascular: No hyperdense vessel or unexpected calcification. Skull: Normal. Negative for fracture or focal lesion. Sinuses/Orbits:  No acute finding. Other: None. IMPRESSION: Mild atrophic changes without acute intracranial abnormality. Electronically Signed   By: Inez Catalina M.D.    On: 01/02/2017 14:18   Ct Biopsy  Result Date: 12/06/2016 INDICATION: Concern for metastatic lung cancer. Please perform CT-guided biopsy for tissue diagnostic purposes. EXAM: CT-GUIDED BIOPSY OF HYPERMETABOLIC LEFT LATERAL CHEST WALL MASS. COMPARISON:  PET-CT - 11/29/2016; chest CT - 11/13/2016 MEDICATIONS: None. ANESTHESIA/SEDATION: Fentanyl 37.5 mcg IV; Versed 1 mg IV Sedation time: 13 minutes; The patient was continuously monitored during the procedure by the interventional radiology nurse under my direct supervision. CONTRAST:  None COMPLICATIONS: None immediate. PROCEDURE: Informed consent was obtained from the patient following an explanation of the procedure, risks, benefits and alternatives. The patient understands,agrees and consents for the procedure. All questions were addressed. A time out was performed prior to the initiation of the procedure. The patient was positioned supine, slightly RPO on the CT table and a limited chest CT was performed for procedural planning demonstrating unchanged size and appearance of the hypermetabolic mass involving the lateral aspect of the left tenth rib measuring approximately 3.2 x 2.5 cm (image 40, series 3). The operative site was prepped and draped in the usual sterile fashion. Under sterile conditions and local anesthesia, a 17 gauge coaxial needle was advanced into the peripheral aspect of the nodule. Positioning was confirmed with intermittent CT fluoroscopy and followed by the acquisition of 5 core needle biopsies with an 18 gauge core needle biopsy device. The coaxial needle was removed and superficial hemostasis was achieved with manual compression. Limited post procedural chest CT was negative for pneumothorax or additional complication. A dressing was placed. The patient tolerated the procedure well without immediate postprocedural complication. The patient was escorted to have an upright chest radiograph. IMPRESSION: Technically successful CT guided  core needle core biopsy of indeterminate hypermetabolic mass involving the lateral aspect of the left tenth rib. Electronically Signed   By: Sandi Mariscal M.D.   On: 12/06/2016 13:41   Dg Chest Port 1 View  Result Date: 12/18/2016 CLINICAL DATA:  Chest tube placement EXAM: PORTABLE CHEST 1 VIEW COMPARISON:  12/14/2016, 12/08/2016, PET-CT 11/29/2016 FINDINGS: Placement of a left-sided central venous port with the tip projecting over the SVC confluence. Interim insertion of left lower chest tube with poorly visible tip, possibly coiled at the left CP angle. Decreased left pleural effusion or thickening. Patchy atelectasis or infiltrate at the left base. Stable cardiomediastinal silhouette. Questionable tiny left apical pneumothorax. IMPRESSION: 1. Insertion of left lower chest drainage tube with poorly visible tip, it is possibly coiled at the left CP angle 2. Decreased left pleural effusion or thickening. Possible tiny left apical pneumothorax. 3. Patchy atelectasis or infiltrate at the left lung base 4. Mild cardiomegaly Electronically Signed   By: Donavan Foil M.D.   On: 12/18/2016 15:34   Dg C-arm 1-60 Min-no Report  Result Date: 12/18/2016 Fluoroscopy was utilized by the requesting physician.  No radiographic interpretation.   US Thoracentesis Asp Pleural Space W/img Guide  Result Date: 12/14/2016 INDICATION: Malignant left pleural effusion. EXAM: ULTRASOUND GUIDED LEFT THORACENTESIS MEDICATIONS: None. COMPLICATIONS: None immediate. PROCEDURE: An ultrasound guided thoracentesis was thoroughly discussed with the patient and questions answered. The benefits, risks, alternatives and complications were also discussed. The patient understands and wishes to proceed with the procedure. Written consent was obtained. Ultrasound was performed to localize and mark an adequate pocket of fluid in the left chest. The area was then prepped and draped in the normal sterile  fashion. 1% Lidocaine was used for local  anesthesia. Under ultrasound guidance a 6 Fr Safe-T-Centesis catheter was introduced. Thoracentesis was performed. The catheter was removed and a dressing applied. FINDINGS: A total of approximately 1.3 L of amber colored fluid was removed. IMPRESSION: Successful ultrasound guided left thoracentesis yielding 1.3 L of pleural fluid. Electronically Signed   By: Markus Daft M.D.   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 Patient was scheduled for an MRI the brain, but canceled this appointment. This will need to be rescheduled in the near future to complete the staging workup. Patient has agreed to palliative chemotherapy with carboplatinum and Taxol, but given her advanced age will dose reduce carboplatinum and give weekly Taxol. Patient will receive carboplatinum AUC 5 on day 1 and Taxol 80 mg/m on days 1, 8, and 15. This will be a 28 day cycle. Patient has indicated that if her quality of life is effected negatively or side effects are intolerable, she would likely discontinue treatment. Her PDL-1 level is 90%, therefore immunotherapy may be of benefit in the future. Hospice was previsouly discussed, but the patient is not ready for this at this time.  Proceed with cycle 1, day 8, Taxol only today. Return to clinic in 1 week for further evaluation and consideration of cycle 1, day 15. 2. Rib pain: Patient has had consultation with radiation oncology for XRT. 3. Rash: Improved. Possibly secondary to Levaquin. Monitor. 4. Hyponatremia: Patient's sodium level is 130, monitor.  Approximately 30 minutes was spent in discussion of which greater than 50% was consultation.  Patient expressed understanding and was in agreement with this plan. She also understands that She can call clinic at any time with any questions, concerns, or complaints.    Cancer Staging Squamous cell carcinoma lung, left ([place]) Staging form: Lung, AJCC 8th Edition - Clinical stage from 12/13/2016: Stage IVA (cT4, cN0, cM1a) - Signed by [name], MD on 12/13/2016   [name], MD   01/02/2017 10:29 PM

## 2017-01-02 NOTE — Op Note (Signed)
  12/31/2016 - 01/02/2017  10:45 AM  PATIENT:  [name]  83 y.o. female  PRE-OPERATIVE DIAGNOSIS:  Infected Pleurx catheter  POST-OPERATIVE DIAGNOSIS:  Same  PROCEDURE:  Removal Pleurx catheter  SURGEON:  Surgeon(s) and Role:    [name], MD - Primary  ASSISTANTS: Large Gardner PAS  ANESTHESIA: Local  INDICATIONS FOR PROCEDURE this patient is an 83 year old woman who's had a Pleurx catheter for about 2 weeks. There is been some drainage around the catheter and there is been some pain at the insertion site. There is evidence of a cellulitis involving that area and she's been unable to drain much from the catheter. Therefore it was nonfunctioning and possibly infected and was removed.  DICTATION: The patient is brought to the operating suite and placed in the supine position. Patient was bumped slightly upwards with exposure of the catheter at the left chest. The wounds were prepped and draped. Using 1% lidocaine as a local anesthetic we anesthetized the skin entrance site. We removed the sutures present and then gently withdrew the catheter. There is minimal, if any, tissue ingrowth into the catheter Dacron cuff.  We did excise the tip of the catheter and sent this for culture. I then packed the wound with quarter inch Nu Gauze. This took approximately 6-7 inches. Sterile dressings were applied. Patient was then transported to the recovery room in stable condition.   [name], MD

## 2017-01-02 NOTE — Progress Notes (Signed)
Patient discharged home per MD order. Prescriptions given to patient. All discharge instructions given to patient and granddaughter and all questions answered.

## 2017-01-02 NOTE — Consult Note (Signed)
ORTHOPAEDIC CONSULTATION  REQUESTING PHYSICIAN: Loletha Grayer, MD  Chief Complaint: Left wrist pain and deformity status post fall  HPI: KEYLI DUROSS is a 83 y.o. female who complains of left wrist pain and deformity after she tripped in her hospital room today getting ready to go home. Patient landed on her left wrist and ad pain and deformity following this injury. She is seen in her hospital room this evening. She denies any numbness or tingling in her left hand.  She has the left hand elevated on a pillow. She is complaining of left wrist pain.  Past Medical History:  Diagnosis Date  . COPD (chronic obstructive pulmonary disease) ([place])   . GU (gastric peptic ulcer)   . Lung cancer ([place])   . Malignant pleural effusion   . PAF (paroxysmal atrial fibrillation) ([place]) 10/2016   a. 10/2016 post thoracentesis ; b. 10/2016 Echo: EF 65-70%, mild LVH;  c. Amio/Eliquis initiated (CHA2DSVASc = 3).  . Pneumonia   . Wrist fracture 1993   bilateral   Past Surgical History:  Procedure Laterality Date  . ABDOMINAL HYSTERECTOMY    . CATARACT EXTRACTION W/ INTRAOCULAR LENS  IMPLANT, BILATERAL    . CHEST TUBE INSERTION Left 12/18/2016   Procedure: CHEST TUBE INSERTION;  Surgeon: [name], MD;  Location: [facility] ORS;  Service: General;  Laterality: Left;  . CHOLECYSTECTOMY    . PORTACATH PLACEMENT Left 12/18/2016   Procedure: INSERTION PORT-A-CATH;  Surgeon: [name], MD;  Location: [facility] ORS;  Service: General;  Laterality: Left;  . REMOVAL OF PLEURAL DRAINAGE CATHETER N/A 01/02/2017   Procedure: REMOVAL OF PLEURAL DRAINAGE CATHETER;  Surgeon: [name], MD;  Location: [facility] ORS;  Service: Thoracic;  Laterality: N/A;  . TONSILLECTOMY     Social History   Social History  . Marital status: Widowed    Spouse name: N/A  . Number of children: N/A  . Years of education: N/A   Social History Main Topics  . Smoking status: Former Smoker    Packs/day: 1.00    Years: 68.00    Quit  date: 11/06/2016  . Smokeless tobacco: Never Used  . Alcohol use No  . Drug use: No  . Sexual activity: No   Other Topics Concern  . None   Social History Narrative  . None   Family History  Problem Relation Age of Onset  . Hypertension Father   . Heart disease Father   . Heart attack Father   . Congestive Heart Failure Mother   . Stroke Mother   . Atrial fibrillation Sister    Allergies  Allergen Reactions  . Sulfa Antibiotics Other (See Comments)    Seizures   . Levaquin [Levofloxacin] Hives and Itching   Prior to Admission medications   Medication Sig Start Date End Date Taking? Authorizing Provider  apixaban (ELIQUIS) 5 MG TABS tablet Take 1 tablet (5 mg total) by mouth 2 (two) times daily. 11/16/16  Yes [name], MD  cetirizine (ZYRTEC) 10 MG tablet Take 10 mg by mouth daily as needed for allergies.   Yes [provider]  cholecalciferol (VITAMIN D) 1000 units tablet Take 1,000 Units by mouth daily.   Yes [provider]  diltiazem (CARDIZEM CD) 180 MG 24 hr capsule Take 1 capsule (180 mg total) by mouth daily. 12/15/16  Yes [name], NP  furosemide (LASIX) 20 MG tablet Take 1 tablet by mouth daily as needed. For EDEMA 10/07/16  Yes [provider]  HYDROcodone-acetaminophen [facility]) 5-325  MG tablet Take 1 tablet by mouth every 4 (four) hours as needed for moderate pain. 12/26/16  Yes [name], MD  hydrOXYzine (ATARAX/VISTARIL) 25 MG tablet Take 1 tablet (25 mg total) by mouth every 6 (six) hours as needed. For itching. 12/12/16  Yes [name], NP  KLOR-CON M20 20 MEQ tablet Take 40 mEq by mouth 2 (two) times daily. 12/12/16  Yes [provider]  lidocaine-prilocaine (EMLA) cream Apply to affected area once 12/17/16  Yes [name], [name], MD  nystatin-triamcinolone ointment [facility]) Apply 1 application topically 2 (two) times daily. 12/30/16  Yes [name], MD  omeprazole (PRILOSEC) 20 MG capsule Take  20 mg by mouth daily.   Yes [provider]  ondansetron (ZOFRAN) 8 MG tablet Take 1 tablet (8 mg total) by mouth 2 (two) times daily as needed for refractory nausea / vomiting. 12/17/16  Yes [name], MD  prochlorperazine (COMPAZINE) 10 MG tablet Take 1 tablet (10 mg total) by mouth every 6 (six) hours as needed (Nausea or vomiting). 12/17/16  Yes [name], MD  amiodarone (PACERONE) 200 MG tablet Take 1 tablet (200 mg total) by mouth daily. 01/02/17   Loletha Grayer, MD  doxycycline (VIBRA-TABS) 100 MG tablet Take 1 tablet (100 mg total) by mouth every 12 (twelve) hours. 01/02/17   Loletha Grayer, MD  fluconazole (DIFLUCAN) 100 MG tablet Take 1 tablet (100 mg total) by mouth daily. 01/02/17   Loletha Grayer, MD  nystatin (MYCOSTATIN/NYSTOP) powder Apply three times a day to red skin folds 01/02/17   Wieting, Richard, MD  protein supplement shake (PREMIER PROTEIN) LIQD Take 325 mLs (11 oz total) by mouth 2 (two) times daily between meals. 01/02/17   Loletha Grayer, MD   Dg Wrist Complete Left  Result Date: 01/02/2017 CLINICAL DATA:  Fall with bruising EXAM: LEFT WRIST - COMPLETE 3+ VIEW COMPARISON:  None. FINDINGS: Diffuse osteopenia. Comminuted, impacted intra-articular distal radius fracture. About 1/2 shaft diameter of dorsal displacement of distal fracture fragment and moderate dorsal angulation. Comminuted, impacted distal radius fracture with mild dorsal angulation. IMPRESSION: 1. Osteopenia 2. Comminuted, impacted intra-articular distal radius fracture with displacement and angulation. 3. Comminuted, slightly angulated distal ulna fracture. Electronically Signed   By: Donavan Foil M.D.   On: 01/02/2017 14:32   Ct Head Wo Contrast  Result Date: 01/02/2017 CLINICAL DATA:  Recent trip and fall with head injury, no neuro deficit, initial encounter EXAM: CT HEAD WITHOUT CONTRAST TECHNIQUE: Contiguous axial images were obtained from the base of the skull  through the vertex without intravenous contrast. COMPARISON:  None. FINDINGS: Brain: Mild atrophic changes are noted. No findings to suggest acute hemorrhage, acute infarction or space-occupying mass lesion are noted. Vascular: No hyperdense vessel or unexpected calcification. Skull: Normal. Negative for fracture or focal lesion. Sinuses/Orbits: No acute finding. Other: None. IMPRESSION: Mild atrophic changes without acute intracranial abnormality. Electronically Signed   By: Inez Catalina M.D.   On: 01/02/2017 14:18    Positive ROS: All other systems have been reviewed and were otherwise negative with the exception of those mentioned in the HPI and as above.  Physical Exam: General: Alert, no acute distress  MUSCULOSKELETAL: left wrist: Patient has intact skin. There is a dorsal angular deformity to the distal radius.She can flex and extend al digits of left hand. Her fingers are well-perfused and she has a palpable radial pulse. She has tenderness of the distal radius to palpation. She has intact sensation to light touch in all  5 digits of the left hand.  Assessment: Displaced left distal radius fracture  Plan: I spent was warned that she has a displaced distal radius fracture. She showed me her right wrist and had a similar  To that side approximately 30 years ago.  She had this set in the office.  I explained to them we can set her left wrist as well where she could proceed with surgical intervention.  I recommend that we try a closed reduction first. Depending on the reduction achieved a decision to either continue with non-operative management or proceed with surgery could be made.  I recommend splinting in place this evening, since she has an intact neurovascular exam. An AP splint was applied by me.  Patient will continue to elevate and ice the left wrist.  When she is discharged from the hospital she will present to our office immediately so a closed reduction and casting can be performed.  The  patient understood and agreed with this plan.  Our office is [address].  She can come over immediately following discharge and does not need an appointment.    Thornton Park, MD    01/02/2017 6:53 PM

## 2017-01-02 NOTE — Progress Notes (Signed)
Patient ID: [name], female   DOB: 23-Jul-1932, 83 y.o.   MRN: [id number]   [employer] PROGRESS NOTE  [name] BTD:[id number] DOB: 01-08-1933 DOA: 12/31/2016 PCP: Albina Billet, MD  HPI/Subjective: Patient was actually discharged today because she felt well. Prior to physically getting out of the room she was going to bend over and take her phone charger out of the wall and she tripped over her shoe.  She landed on her left wrist and has severe pain. She also hit her head.  Objective: Vitals:   01/02/17 1030 01/02/17 1305  BP:  134/64  Pulse:  95  Resp:  18  Temp: (!) 97 F (36.1 C) 97.9 F (36.6 C)  SpO2:      Filed Weights   01/01/17 0500 01/02/17 0500 01/02/17 0821  Weight: 75.1 kg (165 lb 9 oz) 75.2 kg (165 lb 11.2 oz) 74.8 kg (165 lb)    ROS: Review of Systems  Constitutional: Negative for chills and fever.  Eyes: Negative for blurred vision.  Respiratory: Positive for shortness of breath. Negative for cough.   Cardiovascular: Positive for chest pain.  Gastrointestinal: Negative for abdominal pain, constipation, diarrhea, nausea and vomiting.  Genitourinary: Negative for dysuria.  Musculoskeletal: Negative for joint pain.  Neurological: Negative for dizziness and headaches.   Exam: Physical Exam  Constitutional: She is oriented to person, place, and time.  HENT:  Nose: No mucosal edema.  Mouth/Throat: No oropharyngeal exudate or posterior oropharyngeal edema.  Eyes: Pupils are equal, round, and reactive to light. Conjunctivae, EOM and lids are normal.  Neck: No JVD present. Carotid bruit is not present. No edema present. No thyroid mass and no thyromegaly present.  Cardiovascular: S1 normal and S2 normal.  Exam reveals no gallop.   No murmur heard. Pulses:      Dorsalis pedis pulses are 2+ on the right side, and 2+ on the left side.  Respiratory: No respiratory distress. She has decreased breath sounds in the left lower field. She has no wheezes. She  has no rhonchi. She has no rales.  GI: Soft. Bowel sounds are normal. There is no tenderness.  Musculoskeletal:       Right ankle: She exhibits no swelling.       Left ankle: She exhibits no swelling.  Lymphadenopathy:    She has no cervical adenopathy.  Neurological: She is alert and oriented to person, place, and time. No cranial nerve deficit.  Patient has sensation in her fingers and able to wiggle her fingers on her left hand. Severe pain when palpating wrist area.  Skin: Skin is warm. Nails show no clubbing.  Erythema under the right breast, buttock skin folds near the groin and lower abdomen. Area of where tube is anteriorly does show some slight erythema  Psychiatric: She has a normal mood and affect.      Data Reviewed: Basic Metabolic Panel:  Recent Labs Lab 12/28/16 0945 12/31/16 1519 01/01/17 0424  NA 130* 133* 133*  K 3.9 3.9 3.7  CL 95* 97* 98*  CO2 24 27 25   GLUCOSE 122* 113* 154*  BUN 17 16 13   CREATININE 0.88 0.70 0.68  CALCIUM 8.4* 8.2* 7.6*   Liver Function Tests:  Recent Labs Lab 12/28/16 0945 12/31/16 1519  AST 18 18  ALT 13* 14  ALKPHOS 94 97  BILITOT 0.6 1.2  PROT 5.9* 6.0*  ALBUMIN 2.8* 2.5*   CBC:  Recent Labs Lab 12/28/16 0945 12/31/16 1519  WBC 10.2 4.8  NEUTROABS 8.3* 4.0  HGB 11.1* 11.0*  HCT 33.3* 32.3*  MCV 81.5 82.0  PLT 209 187   Cardiac Enzymes:  Recent Labs Lab 12/31/16 1519  TROPONINI <0.03   BNP (last 3 results)  Recent Labs  11/12/16 1439  BNP 88.0      Studies: Dg Chest 2 View  Result Date: 12/31/2016 CLINICAL DATA:  83 y/o F; shortness of breath. History of lung cancer. EXAM: CHEST  2 VIEW COMPARISON:  12/26/2016 chest radiographs FINDINGS: Left pleural drain in situ. Left port catheter tip projects over the upper SVC. Increase left-sided pleural effusion. Left hemithorax pleural nodularity also appears increased from prior chest radiograph although this may be projectional due to patient rotation.  New opacity in the left mid lung zone may represent a focus of pneumonitis. Mildly enlarged cardiac silhouette. Mild reverse S curvature of the thoracic spine. Right upper quadrant cholecystectomy clips. IMPRESSION: 1. Increased left effusion. Apparent increase in pleural nodularity, possibly due to differences in projection. 2. New opacity in left mid lung zone may represent a focus of pneumonitis. 3. Stable position of left pleural drain and port catheter. Electronically Signed   By: Kristine Garbe M.D.   On: 12/31/2016 17:11   Dg Wrist Complete Left  Result Date: 01/02/2017 CLINICAL DATA:  Fall with bruising EXAM: LEFT WRIST - COMPLETE 3+ VIEW COMPARISON:  None. FINDINGS: Diffuse osteopenia. Comminuted, impacted intra-articular distal radius fracture. About 1/2 shaft diameter of dorsal displacement of distal fracture fragment and moderate dorsal angulation. Comminuted, impacted distal radius fracture with mild dorsal angulation. IMPRESSION: 1. Osteopenia 2. Comminuted, impacted intra-articular distal radius fracture with displacement and angulation. 3. Comminuted, slightly angulated distal ulna fracture. Electronically Signed   By: Donavan Foil M.D.   On: 01/02/2017 14:32   Ct Head Wo Contrast  Result Date: 01/02/2017 CLINICAL DATA:  Recent trip and fall with head injury, no neuro deficit, initial encounter EXAM: CT HEAD WITHOUT CONTRAST TECHNIQUE: Contiguous axial images were obtained from the base of the skull through the vertex without intravenous contrast. COMPARISON:  None. FINDINGS: Brain: Mild atrophic changes are noted. No findings to suggest acute hemorrhage, acute infarction or space-occupying mass lesion are noted. Vascular: No hyperdense vessel or unexpected calcification. Skull: Normal. Negative for fracture or focal lesion. Sinuses/Orbits: No acute finding. Other: None. IMPRESSION: Mild atrophic changes without acute intracranial abnormality. Electronically Signed   By: Inez Catalina M.D.   On: 01/02/2017 14:18    Scheduled Meds: . albuterol  2.5 mg Nebulization Q4H while awake  . amiodarone  200 mg Oral BID  . budesonide (PULMICORT) nebulizer solution  0.5 mg Nebulization BID  . cholecalciferol  1,000 Units Oral Daily  . docusate sodium  100 mg Oral BID  . doxycycline  100 mg Oral Q12H  . fluconazole  100 mg Oral Daily  . lidocaine-prilocaine   Topical Daily  . loratadine  10 mg Oral Daily  . multivitamin with minerals  1 tablet Oral Daily  . nystatin   Topical TID  . pantoprazole  40 mg Oral Daily  . potassium chloride SA  40 mEq Oral BID  . protein supplement shake  11 oz Oral BID BM  . senna  1 tablet Oral BID  . sodium chloride flush  3 mL Intravenous Q12H  . sodium chloride flush  3 mL Intravenous Q12H  . temazepam  15 mg Oral QHS   Continuous Infusions: . sodium chloride      Assessment/Plan:  1. Head trauma from  fall and on Eliquis. Watch neuro status overnight. Left distal radial fracture from fall.  Case discussed with orthopedic surgery to stabilize the wrist. 2. Chest pain related to Pleurex catheter. Pleurex catheter removed and patient has no further chest pain. Culture sent off by Dr. Faith Rogue. Doxycycline prescribed 3. Pneumonitis on chest x-ray. Stop steroids and place on doxycycline 4. Fungal infection under skin folds. Start oral Diflucan and continue antifungal pads and nystatin powder under skin folds. 5. Paroxysmal atrial fibrillation on Eliquis, amiodarone. Diltiazem stopped. 6. 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 Follow-up with Dr. Grayland Ormond as outpatient on Thursday 7. Hyponatremia.   Code Status:     Code Status Orders        Start     Ordered   01/01/17 0405  Do not attempt resuscitation (DNR)  Continuous    Question Answer Comment  In the event of cardiac or respiratory ARREST Do not call a "code blue"   In the event of cardiac or respiratory ARREST Do not perform Intubation, CPR, defibrillation or ACLS    In the event of cardiac or respiratory ARREST Use medication by any route, position, wound care, and other measures to relive pain and suffering. May use oxygen, suction and manual treatment of airway obstruction as needed for comfort.      01/01/17 0404    Code Status History    Date Active Date Inactive Code Status Order ID Comments User Context   01/01/2017  2:21 AM 01/01/2017  4:04 AM Full Code [id number]  Gorden Harms, MD Inpatient   12/18/2016  4:36 PM 12/19/2016  5:57 PM Full Code [id number]  [name], MD Inpatient   11/12/2016  6:12 PM 11/16/2016  6:18 PM Full Code [id number]  Dustin Flock, MD Inpatient     Family Communication: granddaughter at bedside Disposition Plan: actually patient was discharged until she had a fall.  Consultants:  Cardiothoracic surgery  Oncology  Orthopedic surgery  Antibiotics:  doxycycline  Time spent: 69 minutes  [place], [place]

## 2017-01-03 LAB — CREATININE, SERUM
Creatinine, Ser: 0.77 mg/dL (ref 0.44–1.00)
GFR calc Af Amer: >60 mL/min (ref >60)
GFR calc non Af Amer: >60 mL/min (ref >60)

## 2017-01-03 MED ORDER — ENOXAPARIN SODIUM 40 MG/0.4ML ~~LOC~~ SOLN
40.0000 mg | SUBCUTANEOUS | Status: DC
  Administered 2017-01-03: 40 mg via SUBCUTANEOUS
  Filled 2017-01-03: qty 0.4

## 2017-01-03 MED ORDER — WITCH HAZEL-GLYCERIN EX PADS
MEDICATED_PAD | CUTANEOUS | Status: DC | PRN
  Filled 2017-01-03: qty 100

## 2017-01-03 MED ORDER — OXYCODONE HCL 5 MG PO TABS
5.0000 mg | ORAL_TABLET | ORAL | Status: DC | PRN
  Administered 2017-01-03: 10 mg via ORAL
  Administered 2017-01-03: 19:00:00 5 mg via ORAL
  Administered 2017-01-04 – 2017-01-05 (×2): 10 mg via ORAL
  Filled 2017-01-03: qty 1
  Filled 2017-01-03 (×4): qty 2

## 2017-01-03 MED ORDER — AMIODARONE HCL 200 MG PO TABS: ORAL_TABLET | ORAL | 0 refills | Status: DC

## 2017-01-03 MED ORDER — ALBUTEROL SULFATE (2.5 MG/3ML) 0.083% IN NEBU
2.5000 mg | INHALATION_SOLUTION | Freq: Two times a day (BID) | RESPIRATORY_TRACT | Status: DC
  Administered 2017-01-03 – 2017-01-08 (×9): 2.5 mg via RESPIRATORY_TRACT
  Filled 2017-01-03 (×9): qty 3

## 2017-01-03 MED ORDER — SODIUM CHLORIDE 0.9% FLUSH
10.0000 mL | INTRAVENOUS | Status: DC | PRN
Start: 2017-01-03 — End: 2017-01-08

## 2017-01-03 MED ORDER — HYDROCODONE-ACETAMINOPHEN 5-325 MG PO TABS: 1.0000 | ORAL_TABLET | ORAL | 0 refills | Status: DC | PRN

## 2017-01-03 NOTE — Evaluation (Signed)
Physical Therapy Evaluation Patient Details Name: [name] MRN: [id number] DOB: Oct 30, 1932 Today's Date: 01/03/2017   History of Present Illness  Levy Wellman is an 83yo white female who [facility] on 10/[facility] c cough, CP, found to have infection of Pleurx drain. Pt underwent drain removal on 10/16, but sustained a fall in room thereafter, and is now with an acut eLt wrist fracture, splinted with OP plans for casting today. Pt lives alone, formerly fully independent in ADL, IADL, but getting additional help from family as she has been undergtoign CA tratments with chronic pleural effusions.   Clinical Impression  Pt admitted with above diagnosis. Pt currently with functional limitations due to the deficits listed below (see "PT Problem List"). Upon entry, the patient is received semirecumbent in bed, granddaughter present. The pt is awake and agreeable to participate,although she is currently in distress from Left wrist pain. The pt is alert and oriented x3, pleasant, conversational, and following simple and multi-step commands consistently. dFunctional mobility assessment demonstrates mild strength impairment in trunk and BLE, the pt now requiring heavy effort and additional time to perform bed mobility and transfers, and AMB is limited by sensation of severe weakness and instability in legs bilat, whereas the patient performed these at a higher level of independence PTA. The patient has steps to enter home which is currently unable to perform. Wrist pain is heavily limiting at initiation of movement, but once moving does not appear to be a limiting factor. Empirically, the patient demonstrates increased risk of recurrent falls AEB gait speed <0.57m/s, forward reach <5", and multiple LOB demonstrated throughout session. Pt will benefit from skilled PT intervention to increase independence and safety with basic mobility in preparation for discharge to the venue listed below.       Follow Up  Recommendations SNF;Supervision for mobility/OOB    Equipment Recommendations  Other (comment) (none if DC to facility; will need additional DME if DC to home. )    Recommendations for Other Services       Precautions / Restrictions Precautions Precautions: Fall Restrictions Weight Bearing Restrictions: Yes LUE Weight Bearing: Non weight bearing      Mobility  Bed Mobility Overal bed mobility: Needs Assistance Bed Mobility: Supine to Sit     Supine to sit: Min guard     General bed mobility comments: slow and labored, as Lt wrist pain increases with movement   Transfers Overall transfer level: Needs assistance Equipment used: None Transfers: Sit to/from Stand Sit to Stand: Min guard         General transfer comment: performs 5x from chair, slow and labored, as Lt wrist pain increases with movement   Ambulation/Gait   Ambulation Distance (Feet): 8 Feet Assistive device: None     Gait velocity interpretation: <1.8 ft/sec, indicative of risk for recurrent falls General Gait Details: AMB halfway to door, slow unsteady, reports fear of legs buckling, legs being weak, and requests to return to bed.   Stairs            Wheelchair Mobility    Modified Rankin (Stroke Patients Only)       Balance Overall balance assessment: History of Falls;No apparent balance deficits (not formally assessed)                                           Pertinent Vitals/Pain Pain Assessment: Faces Pain Score:  8  Faces Pain Scale: Hurts worst Pain Location: Left wrist  Pain Descriptors / Indicators: Aching Pain Intervention(s): Limited activity within patient's tolerance;Premedicated before session;Monitored during session    [place] expects to be discharged to:: Private residence Living Arrangements: Alone Available Help at Discharge: Family (granddaughter, daughter, and cousin ) Type of Home: House Home Access: Stairs to enter    Technical brewer of Steps: 5 Home Layout: One level   Additional Comments: family uses a tranport chair when going to medical appointments.     Prior Function Level of Independence: Independent         Comments: Recent pleural effusions since LungCA have limited mobility due to DOE.      Hand Dominance   Dominant Hand: Right    Extremity/Trunk Assessment                Communication   Communication: No difficulties  Cognition Arousal/Alertness: Awake/alert Behavior During Therapy: Anxious Overall Cognitive Status: Within Functional Limits for tasks assessed                                        General Comments      Exercises     Assessment/Plan    PT Assessment Patient needs continued PT services  PT Problem List Decreased activity tolerance;Decreased strength;Decreased mobility;Decreased balance       PT Treatment Interventions DME instruction;Gait training;Functional mobility training;Stair training;Therapeutic activities;Therapeutic exercise;Balance training;Patient/family education;Wheelchair mobility training    PT Goals (Current goals can be found in the Care Plan section)  Acute Rehab PT Goals Patient Stated Goal: regain independence in AMB  PT Goal Formulation: With patient Time For Goal Achievement: 01/17/17 Potential to Achieve Goals: Good    Frequency Min 2X/week   Barriers to discharge Inaccessible home environment      Co-evaluation               AM-PAC PT "6 Clicks" Daily Activity  Outcome Measure Difficulty turning over in bed (including adjusting bedclothes, sheets and blankets)?: A Little Difficulty moving from lying on back to sitting on the side of the bed? : A Little Difficulty sitting down on and standing up from a chair with arms (e.g., wheelchair, bedside commode, etc,.)?: A Little Help needed moving to and from a bed to chair (including a wheelchair)?: A Lot Help needed walking in hospital  room?: A Lot Help needed climbing 3-5 steps with a railing? : Total 6 Click Score: 14    End of Session Equipment Utilized During Treatment: Gait belt Activity Tolerance: Patient limited by fatigue (pain appears to be fine once she starts moving, both with AMB and 5xSTS. ) Patient left: in chair;with call bell/phone within reach;with chair alarm set;with family/visitor present Nurse Communication: Mobility status PT Visit Diagnosis: Other abnormalities of gait and mobility (R26.89);Difficulty in walking, not elsewhere classified (R26.2)    Time: 0347-4259 PT Time Calculation (min) (ACUTE ONLY): 20 min   Charges:   PT Evaluation $PT Eval Moderate Complexity: 1 Mod PT Treatments $Therapeutic Activity: 8-22 mins   PT G Codes:   PT G-Codes **NOT FOR INPATIENT CLASS** Functional Assessment Tool Used: AM-PAC 6 Clicks Basic Mobility Functional Limitation: Mobility: Walking and moving around Mobility: Walking and Moving Around Current Status (D6387): At least 40 percent but less than 60 percent impaired, limited or restricted Mobility: Walking and Moving Around Goal Status [phone]): At least 20 percent  but less than 40 percent impaired, limited or restricted   11:55 AM, 01/03/17 Etta Grandchild, PT, DPT Physical Therapist - [facility])  [phone] (mobile)    Kadedra Vanaken C 01/03/2017, 11:51 AM

## 2017-01-03 NOTE — Plan of Care (Signed)
Problem: Safety: Goal: Ability to remain free from injury will improve Outcome: Progressing Pt remains on high fall risks this shift; 1 assist oob/chair

## 2017-01-03 NOTE — Progress Notes (Addendum)
Patient ID: [name], female   DOB: 24-Aug-1932, 83 y.o.   MRN: [id number]    [facility] PROGRESS NOTE  Daleyssa Loiselle Konecny CWC:[id number] DOB: November 10, 1932 DOA: 12/31/2016 PCP: Albina Billet, MD  HPI/Subjective: Patient having a lot more pain in her left wrist. Otherwise feels okay. No shortness of breath or chest pain since removal of the Pleurx catheter.  Objective: Vitals:   01/03/17 1143 01/03/17 1144  BP:    Pulse: (!) 104   Resp: 18   Temp:    SpO2: 97% 97%    Filed Weights   01/02/17 0500 01/02/17 0821 01/03/17 0500  Weight: 75.2 kg (165 lb 11.2 oz) 74.8 kg (165 lb) 75.8 kg (167 lb 3.2 oz)    ROS: Review of Systems  Constitutional: Negative for chills and fever.  Eyes: Negative for blurred vision.  Respiratory: Negative for cough and shortness of breath.   Cardiovascular: Negative for chest pain.  Gastrointestinal: Negative for abdominal pain, constipation, diarrhea, nausea and vomiting.  Genitourinary: Negative for dysuria.  Musculoskeletal: Positive for joint pain.  Neurological: Negative for dizziness and headaches.   Exam: Physical Exam  Constitutional: She is oriented to person, place, and time.  HENT:  Nose: No mucosal edema.  Mouth/Throat: No oropharyngeal exudate or posterior oropharyngeal edema.  Eyes: Pupils are equal, round, and reactive to light. Conjunctivae, EOM and lids are normal.  Neck: No JVD present. Carotid bruit is not present. No edema present. No thyroid mass and no thyromegaly present.  Cardiovascular: S1 normal and S2 normal.  An irregularly irregular rhythm present. Exam reveals no gallop.   No murmur heard. Pulses:      Dorsalis pedis pulses are 2+ on the right side, and 2+ on the left side.  Respiratory: No respiratory distress. She has decreased breath sounds in the left lower field. She has no wheezes. She has no rhonchi. She has no rales.  GI: Soft. Bowel sounds are normal. There is no tenderness.  Musculoskeletal:       Right  ankle: She exhibits no swelling.       Left ankle: She exhibits no swelling.  Left wrist in splint  Lymphadenopathy:    She has no cervical adenopathy.  Neurological: She is alert and oriented to person, place, and time. No cranial nerve deficit.  Patient has sensation in her fingers and able to wiggle her fingers on her left hand. Severe pain when palpating wrist area.  Skin: Skin is warm. Nails show no clubbing.  Erythema under the right breast, buttock skin folds near the groin and lower abdomen. Area of where tube is anteriorly does show some slight erythema  Psychiatric: She has a normal mood and affect.      Data Reviewed: Basic Metabolic Panel:  Recent Labs Lab 12/28/16 0945 12/31/16 1519 01/01/17 0424 01/03/17 0413  NA 130* 133* 133*  --   K 3.9 3.9 3.7  --   CL 95* 97* 98*  --   CO2 24 27 25   --   GLUCOSE 122* 113* 154*  --   BUN 17 16 13   --   CREATININE 0.88 0.70 0.68 0.77  CALCIUM 8.4* 8.2* 7.6*  --    Liver Function Tests:  Recent Labs Lab 12/28/16 0945 12/31/16 1519  AST 18 18  ALT 13* 14  ALKPHOS 94 97  BILITOT 0.6 1.2  PROT 5.9* 6.0*  ALBUMIN 2.8* 2.5*   CBC:  Recent Labs Lab 12/28/16 0945 12/31/16 1519  WBC 10.2 4.8  NEUTROABS 8.3* 4.0  HGB 11.1* 11.0*  HCT 33.3* 32.3*  MCV 81.5 82.0  PLT 209 187   Cardiac Enzymes:  Recent Labs Lab 12/31/16 1519  TROPONINI <0.03   BNP (last 3 results)  Recent Labs  11/12/16 1439  BNP 88.0      Studies: Dg Wrist Complete Left  Result Date: 01/02/2017 CLINICAL DATA:  Fall with bruising EXAM: LEFT WRIST - COMPLETE 3+ VIEW COMPARISON:  None. FINDINGS: Diffuse osteopenia. Comminuted, impacted intra-articular distal radius fracture. About 1/2 shaft diameter of dorsal displacement of distal fracture fragment and moderate dorsal angulation. Comminuted, impacted distal radius fracture with mild dorsal angulation. IMPRESSION: 1. Osteopenia 2. Comminuted, impacted intra-articular distal radius  fracture with displacement and angulation. 3. Comminuted, slightly angulated distal ulna fracture. Electronically Signed   By: Donavan Foil M.D.   On: 01/02/2017 14:32   Ct Head Wo Contrast  Result Date: 01/02/2017 CLINICAL DATA:  Recent trip and fall with head injury, no neuro deficit, initial encounter EXAM: CT HEAD WITHOUT CONTRAST TECHNIQUE: Contiguous axial images were obtained from the base of the skull through the vertex without intravenous contrast. COMPARISON:  None. FINDINGS: Brain: Mild atrophic changes are noted. No findings to suggest acute hemorrhage, acute infarction or space-occupying mass lesion are noted. Vascular: No hyperdense vessel or unexpected calcification. Skull: Normal. Negative for fracture or focal lesion. Sinuses/Orbits: No acute finding. Other: None. IMPRESSION: Mild atrophic changes without acute intracranial abnormality. Electronically Signed   By: Inez Catalina M.D.   On: 01/02/2017 14:18    Scheduled Meds: . albuterol  2.5 mg Nebulization QID  . amiodarone  200 mg Oral BID  . budesonide (PULMICORT) nebulizer solution  0.5 mg Nebulization BID  . cholecalciferol  1,000 Units Oral Daily  . docusate sodium  100 mg Oral BID  . doxycycline  100 mg Oral Q12H  . fluconazole  100 mg Oral Daily  . lidocaine-prilocaine   Topical Daily  . loratadine  10 mg Oral Daily  . multivitamin with minerals  1 tablet Oral Daily  . nystatin   Topical TID  . pantoprazole  40 mg Oral Daily  . potassium chloride SA  40 mEq Oral BID  . protein supplement shake  11 oz Oral BID BM  . senna  1 tablet Oral BID  . sodium chloride flush  3 mL Intravenous Q12H  . sodium chloride flush  3 mL Intravenous Q12H  . temazepam  15 mg Oral QHS   Continuous Infusions: . sodium chloride      Assessment/Plan:  1. Weakness. Physical therapy recommends rehabilitation. Patient interested in gg. Considering that she had a fall when she was discharged yesterday, we will try to get insurance  approval for rehabilitation. 2. Distal radial fracture. Case discussed with Dr. Mack Guise orthopedic surgery and he will take her to the operating room to reduce this tomorrow morning. Pain control with Vicodin and morphine. 3. Chest pain related to Pleurex catheter. Pleurex catheter removed and patient has no further chest pain. Culture sent off by Dr. Genevive Bi. Doxycycline prescribed. 4. Pneumonitis on chest x-ray. On doxycycline. 5. Fungal infection under skin folds. Started oral Diflucan and continue antifungal pads and nystatin powder under skin folds. 6. Paroxysmal atrial fibrillation on Eliquis was held on 15th for procedure, will make sure surgery not needed for radial fracture prior to restarting. On amiodarone. Diltiazem stopped secondary to low blood pressure 7. 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 Follow-up with Dr. Grayland Ormond as outpatient on Thursday  8. Hyponatremia.   Code Status:     Code Status Orders        Start     Ordered   01/01/17 0405  Do not attempt resuscitation (DNR)  Continuous    Question Answer Comment  In the event of cardiac or respiratory ARREST Do not call a "code blue"   In the event of cardiac or respiratory ARREST Do not perform Intubation, CPR, defibrillation or ACLS   In the event of cardiac or respiratory ARREST Use medication by any route, position, wound care, and other measures to relive pain and suffering. May use oxygen, suction and manual treatment of airway obstruction as needed for comfort.      01/01/17 0404    Code Status History    Date Active Date Inactive Code Status Order ID Comments User Context   01/01/2017  2:21 AM 01/01/2017  4:04 AM Full Code [id number]  Gorden Harms, MD Inpatient   12/18/2016  4:36 PM 12/19/2016  5:57 PM Full Code [id number]  [name], MD Inpatient   11/12/2016  6:12 PM 11/16/2016  6:18 PM Full Code [id number]  Dustin Flock, MD Inpatient     Family Communication: family yesterday Disposition  Plan: physical therapy recommends rehabilitation. Awaiting prior authorization from insurance company now.  Consultants:  Cardiothoracic surgery  Oncology  Orthopedic surgery  Antibiotics:  doxycycline  Time spent: 28 minutes  [place], [place]

## 2017-01-03 NOTE — Progress Notes (Signed)
Subjective:  Patient reports left wrist pain as marked.  Patient's pain is worse today than yesterday.  She is OOB to a chair.  Her family is at the bedside.  Objective:   VITALS:   Vitals:   01/03/17 0500 01/03/17 0719 01/03/17 1143 01/03/17 1144  BP:      Pulse:   (!) 104   Resp:   18   Temp:      TempSrc:      SpO2:  95% 97% 97%  Weight: 75.8 kg (167 lb 3.2 oz)     Height:        PHYSICAL EXAM: Left upper extremity:  AP splint in place.  Fingers are well perfused.  She has intact sensation to light touch in all digits of left hand.  She can flex and extend her digits.   LABS  Results for orders placed or performed during the hospital encounter of 12/31/16 (from the past 24 hour(s))  Creatinine, serum     Status: None   Collection Time: 01/03/17  4:13 AM  Result Value Ref Range   Creatinine, Ser 0.77 0.44 - 1.00 mg/dL   GFR calc non Af Amer >60 >60 mL/min   GFR calc Af Amer >60 >60 mL/min    Dg Wrist Complete Left  Result Date: 01/02/2017 CLINICAL DATA:  Fall with bruising EXAM: LEFT WRIST - COMPLETE 3+ VIEW COMPARISON:  None. FINDINGS: Diffuse osteopenia. Comminuted, impacted intra-articular distal radius fracture. About 1/2 shaft diameter of dorsal displacement of distal fracture fragment and moderate dorsal angulation. Comminuted, impacted distal radius fracture with mild dorsal angulation. IMPRESSION: 1. Osteopenia 2. Comminuted, impacted intra-articular distal radius fracture with displacement and angulation. 3. Comminuted, slightly angulated distal ulna fracture. Electronically Signed   By: Donavan Foil M.D.   On: 01/02/2017 14:32   Ct Head Wo Contrast  Result Date: 01/02/2017 CLINICAL DATA:  Recent trip and fall with head injury, no neuro deficit, initial encounter EXAM: CT HEAD WITHOUT CONTRAST TECHNIQUE: Contiguous axial images were obtained from the base of the skull through the vertex without intravenous contrast. COMPARISON:  None. FINDINGS: Brain: Mild  atrophic changes are noted. No findings to suggest acute hemorrhage, acute infarction or space-occupying mass lesion are noted. Vascular: No hyperdense vessel or unexpected calcification. Skull: Normal. Negative for fracture or focal lesion. Sinuses/Orbits: No acute finding. Other: None. IMPRESSION: Mild atrophic changes without acute intracranial abnormality. Electronically Signed   By: Inez Catalina M.D.   On: 01/02/2017 14:18    Assessment/Plan: 1 Day Post-Op   Active Problems:   Chest pain   Cellulitis of trunk  Patient is awaiting a rehab bed placement.  Her left wrist pain is worse than it was last night, but she remains neurovascularly intact.  There are no signs of acute carpal tunnel syndrome or compartment syndrome on her exam.  She will need to elevate the left wrist on pillows and ice to reduce swelling.  Given her level of pain, she will not be able to tolerate a closed reduction at the bedside or in the office under hematoma block.  I recommend patient undergo a closed reduction and casting in the OR tomorrow.  If adequate reduction is achieved she will be treated in a cast.  If adequate reduction is not achieved tomorrow, elective ORIF would be scheduled.  She is on Eliquis and that medication would need to be stopped prior to ORIF.  Patient and her family as well as Robyn her nurse understand and agree with this  plan.   Pre-op orders have been entered.      Thornton Park , MD 01/03/2017, 12:20 PM

## 2017-01-03 NOTE — Progress Notes (Signed)
Telephone call to Dr Harden Mo office at [facility] [phone]; pt needs to get left wrist in a cast, see Dr Harden Mo 01/02/17 note in [facility]; scheduled appointment for pt at [facility] for today at 1330; [facility] to arrange transportation for pt to go and come back to Room 103

## 2017-01-03 NOTE — Plan of Care (Signed)
Problem: Education: Goal: Knowledge of [facility] Education information/materials will improve Outcome: Progressing Pt scheduled for OR procedure, closed reduction and casting of left distal radius fracture by Dr Mack Guise on 01/04/2017; education/handout given to pt; consent signed by pt

## 2017-01-03 NOTE — NC FL2 (Signed)
[place] LEVEL OF CARE SCREENING TOOL     IDENTIFICATION  Patient Name: [name] Birthdate: 1932/04/11 Sex: female Admission Date (Current Location): 12/31/2016  [place] and [place] Number:  Engineering geologist and Address:  [facility], [address] [place], [place] [address]      Provider Number: [id number]  Attending Physician Name and Address:  Loletha Grayer, MD  Relative Name and Phone Number:  Michel Bickers Daughter [phone]  [phone] or Denton Meek   [phone] or [place], [place] [phone]     Current Level of Care: Hospital Recommended Level of Care: [place] Prior Approval Number:    Date Approved/Denied:   PASRR Number: [id number]  Discharge Plan: SNF    Current Diagnoses: Patient Active Problem List   Diagnosis Date Noted  . Chest pain 01/01/2017  . Cellulitis of trunk   . Lung cancer ([place]) 12/18/2016  . Goals of care, counseling/discussion 12/17/2016  . Squamous cell carcinoma lung, left ([place]) 12/12/2016  . Atrial fibrillation ([place])   . SOB (shortness of breath) 11/12/2016  . Non-pressure chronic ulcer of right thigh with fat layer exposed ([place]) 03/16/2015    Orientation RESPIRATION BLADDER Height & Weight     Self, Time, Situation, Place  Normal Continent Weight: 167 lb 3.2 oz (75.8 kg) Height:  5\' 3"  (160 cm)  BEHAVIORAL SYMPTOMS/MOOD NEUROLOGICAL BOWEL NUTRITION STATUS      Continent Diet (Cardiac)  AMBULATORY STATUS COMMUNICATION OF NEEDS Skin   Limited Assist Verbally Normal                       Personal Care Assistance Level of Assistance  Bathing, Feeding, Dressing Bathing Assistance: Limited assistance Feeding assistance: Independent Dressing Assistance: Limited assistance     Functional Limitations Info  Sight, Hearing, Speech Sight Info: Adequate Hearing Info: Adequate Speech Info: Adequate    SPECIAL CARE FACTORS  FREQUENCY  PT (By licensed PT), OT (By licensed OT)     PT Frequency: 5x a week OT Frequency: 5x a week            Contractures Contractures Info: Not present    Additional Factors Info  Code Status, Allergies Code Status Info: DNR Allergies Info: SULFA ANTIBIOTICS, LEVAQUIN LEVOFLOXACIN            Current Medications (01/03/2017):  This is the current hospital active medication list Current Facility-Administered Medications  Medication Dose Route Frequency Provider Last Rate Last Dose  . 0.9 %  sodium chloride infusion  250 mL Intravenous PRN Salary, Montell D, MD      . acetaminophen (TYLENOL) tablet 650 mg  650 mg Oral Q6H PRN Salary, Montell D, MD       Or  . acetaminophen (TYLENOL) suppository 650 mg  650 mg Rectal Q6H PRN Salary, Montell D, MD      . albuterol (PROVENTIL) (2.5 MG/3ML) 0.083% nebulizer solution 2.5 mg  2.5 mg Nebulization BID Wieting, Richard, MD      . amiodarone (PACERONE) tablet 200 mg  200 mg Oral BID Salary, Montell D, MD   200 mg at 01/03/17 1017  . bisacodyl (DULCOLAX) suppository 10 mg  10 mg Rectal Daily PRN Salary, Montell D, MD      . budesonide (PULMICORT) nebulizer solution 0.5 mg  0.5 mg Nebulization BID Salary, Montell D, MD   0.5 mg at 01/03/17 0718  . cholecalciferol (VITAMIN D) tablet 1,000 Units  1,000 Units Oral Daily Salary, Montell  D, MD   1,000 Units at 01/03/17 1017  . clonazepam (KLONOPIN) disintegrating tablet 1 mg  1 mg Oral BID PRN Salary, Montell D, MD      . docusate sodium (COLACE) capsule 100 mg  100 mg Oral BID Salary, Montell D, MD   100 mg at 01/03/17 1017  . doxycycline (VIBRA-TABS) tablet 100 mg  100 mg Oral Q12H Loletha Grayer, MD   100 mg at 01/03/17 1018  . enoxaparin (LOVENOX) injection 40 mg  40 mg Subcutaneous Q24H Wieting, Richard, MD      . fluconazole (DIFLUCAN) tablet 100 mg  100 mg Oral Daily Wieting, Richard, MD   100 mg at 01/03/17 1017  . furosemide (LASIX) tablet 20 mg  20 mg Oral Daily PRN Salary,  Montell D, MD      . HYDROcodone-acetaminophen (NORCO/VICODIN) 5-325 MG per tablet 1 tablet  1 tablet Oral Q4H PRN Salary, Montell D, MD   1 tablet at 01/02/17 1349  . hydrOXYzine (ATARAX/VISTARIL) tablet 25 mg  25 mg Oral Q6H PRN Salary, Montell D, MD      . lidocaine-prilocaine (EMLA) cream   Topical Daily Salary, Montell D, MD      . loratadine (CLARITIN) tablet 10 mg  10 mg Oral Daily Salary, Montell D, MD   10 mg at 01/03/17 1017  . morphine 2 MG/ML injection 1-2 mg  1-2 mg Intravenous Q1H PRN [name], MD   2 mg at 01/03/17 1226  . multivitamin with minerals tablet 1 tablet  1 tablet Oral Daily Loletha Grayer, MD   1 tablet at 01/03/17 1018  . nystatin (MYCOSTATIN/NYSTOP) topical powder   Topical TID Salary, Montell D, MD      . ondansetron (ZOFRAN) injection 4 mg  4 mg Intravenous Q6H PRN [name], MD      . ondansetron [facility]) tablet 8 mg  8 mg Oral BID PRN Salary, Montell D, MD      . oxyCODONE (Oxy IR/ROXICODONE) immediate release tablet 5-10 mg  5-10 mg Oral Q3H PRN Thornton Park, MD      . pantoprazole (PROTONIX) EC tablet 40 mg  40 mg Oral Daily Salary, Montell D, MD   40 mg at 01/03/17 1017  . potassium chloride SA (K-DUR,KLOR-CON) CR tablet 40 mEq  40 mEq Oral BID Salary, Holly Bodily D, MD   40 mEq at 01/03/17 1018  . prochlorperazine (COMPAZINE) tablet 10 mg  10 mg Oral Q6H PRN Salary, Montell D, MD      . promethazine (PHENERGAN) tablet 12.5 mg  12.5 mg Oral Q6H PRN Salary, Montell D, MD      . protein supplement (PREMIER PROTEIN) liquid  11 oz Oral BID BM Loletha Grayer, MD   11 oz at 01/02/17 1142  . senna (SENOKOT) tablet 8.6 mg  1 tablet Oral BID Loletha Grayer, MD   8.6 mg at 01/03/17 1017  . sodium chloride flush (NS) 0.9 % injection 3 mL  3 mL Intravenous Q12H Salary, Montell D, MD   3 mL at 01/02/17 2200  . sodium chloride flush (NS) 0.9 % injection 3 mL  3 mL Intravenous Q12H Salary, Montell D, MD   3 mL at 01/02/17 2200  . sodium chloride flush (NS) 0.9 %  injection 3 mL  3 mL Intravenous PRN Salary, Montell D, MD      . sodium phosphate (FLEET) 7-19 GM/118ML enema 1 enema  1 enema Rectal Once PRN Salary, Montell D, MD      . temazepam (  RESTORIL) capsule 15 mg  15 mg Oral QHS Salary, Holly Bodily D, MD   15 mg at 01/02/17 2049     Discharge Medications: Please see discharge summary for a list of discharge medications.  Relevant Imaging Results:  Relevant Lab Results:   Additional Information SSN [id number]  Ross Ludwig, [place]

## 2017-01-03 NOTE — Progress Notes (Signed)
Telephone call to 911 to arrange nonemergency transportation from Room 103 to [place] at [facility] and back to Room 103; EMS scheduled for 1315 pickup

## 2017-01-03 NOTE — Progress Notes (Signed)
Dr Mack Guise currently on the floor seeing the pt; Dr Mack Guise verbalized that the pt would not be able to have the left wrist manipulated today; to schedule time in the OR for 01/04/17 for left manipuation; see orders in [facility]

## 2017-01-03 NOTE — Progress Notes (Signed)
   01/03/17 2200  Clinical Encounter Type  Visited With Patient not available;Health care provider  Visit Type Initial  Referral From Nurse  RN paged to ask [facility] to pray with patient who is scheduled for surgery after breaking wrist during discharge; upon arrival, patient observed to be sleeping and informed RN: [name] will recommend follow-up by day chaplain.

## 2017-01-03 NOTE — Progress Notes (Signed)
Pt c/o pain.  Refusing oral pain medication.  Did not give morphine IV because blood pressure is low. Dorna Bloom RN

## 2017-01-04 ENCOUNTER — Observation Stay: Payer: Medicare HMO | Admitting: Anesthesiology

## 2017-01-04 ENCOUNTER — Inpatient Hospital Stay: Payer: Medicare HMO

## 2017-01-04 ENCOUNTER — Inpatient Hospital Stay: Payer: Medicare HMO | Admitting: Oncology

## 2017-01-04 ENCOUNTER — Encounter: Payer: Self-pay | Admitting: Anesthesiology

## 2017-01-04 ENCOUNTER — Encounter
Admission: EM | Disposition: A | Discharge status: Skilled Nursing Facility | Payer: Self-pay | Source: Home / Self Care | Attending: Internal Medicine

## 2017-01-04 HISTORY — PX: CLOSED REDUCTION WRIST FRACTURE: SHX1091

## 2017-01-04 LAB — MRSA PCR SCREENING: MRSA BY PCR: POSITIVE — AB

## 2017-01-04 SURGERY — CLOSED REDUCTION, WRIST
Anesthesia: General | Site: Wrist | Laterality: Left

## 2017-01-04 MED ORDER — CHLORHEXIDINE GLUCONATE CLOTH 2 % EX PADS
6.0000 | MEDICATED_PAD | Freq: Every day | CUTANEOUS | Status: AC
  Administered 2017-01-04 – 2017-01-08 (×5): 6 via TOPICAL

## 2017-01-04 MED ORDER — MUPIROCIN 2 % EX OINT
1.0000 "application " | TOPICAL_OINTMENT | Freq: Two times a day (BID) | CUTANEOUS | Status: DC
  Administered 2017-01-04 – 2017-01-08 (×9): 1 via NASAL
  Filled 2017-01-04: qty 22

## 2017-01-04 MED ORDER — PHENYLEPHRINE HCL 10 MG/ML IJ SOLN
INTRAMUSCULAR | Status: AC
  Filled 2017-01-04: qty 1

## 2017-01-04 MED ORDER — ONDANSETRON HCL 4 MG/2ML IJ SOLN: 4.0000 mg | Freq: Once | INTRAMUSCULAR | Status: DC | PRN

## 2017-01-04 MED ORDER — FENTANYL CITRATE (PF) 100 MCG/2ML IJ SOLN
25.0000 ug | INTRAMUSCULAR | Status: DC | PRN
  Administered 2017-01-04: 25 ug via INTRAVENOUS

## 2017-01-04 MED ORDER — PROPOFOL 10 MG/ML IV BOLUS
INTRAVENOUS | Status: AC
  Filled 2017-01-04: qty 20

## 2017-01-04 MED ORDER — ONDANSETRON HCL 4 MG/2ML IJ SOLN
INTRAMUSCULAR | Status: AC
  Filled 2017-01-04: qty 2

## 2017-01-04 MED ORDER — FENTANYL CITRATE (PF) 100 MCG/2ML IJ SOLN
INTRAMUSCULAR | Status: DC | PRN
  Administered 2017-01-04 (×2): 25 ug via INTRAVENOUS

## 2017-01-04 MED ORDER — PROPOFOL 10 MG/ML IV BOLUS
INTRAVENOUS | Status: DC | PRN
  Administered 2017-01-04: 10 mg via INTRAVENOUS
  Administered 2017-01-04: 30 mg via INTRAVENOUS
  Administered 2017-01-04: 10 mg via INTRAVENOUS
  Administered 2017-01-04 (×2): 20 mg via INTRAVENOUS
  Administered 2017-01-04: 10 mg via INTRAVENOUS

## 2017-01-04 MED ORDER — LIDOCAINE HCL (PF) 2 % IJ SOLN
INTRAMUSCULAR | Status: AC
  Filled 2017-01-04: qty 10

## 2017-01-04 MED ORDER — APIXABAN 5 MG PO TABS
5.0000 mg | ORAL_TABLET | Freq: Two times a day (BID) | ORAL | Status: DC
  Administered 2017-01-04 – 2017-01-08 (×9): 5 mg via ORAL
  Filled 2017-01-04 (×9): qty 1

## 2017-01-04 MED ORDER — ONDANSETRON HCL 4 MG/2ML IJ SOLN
INTRAMUSCULAR | Status: DC | PRN
  Administered 2017-01-04: 4 mg via INTRAVENOUS

## 2017-01-04 MED ORDER — PHENYLEPHRINE HCL 10 MG/ML IJ SOLN
INTRAMUSCULAR | Status: DC | PRN
  Administered 2017-01-04 (×2): 100 ug via INTRAVENOUS

## 2017-01-04 MED ORDER — SUCCINYLCHOLINE CHLORIDE 20 MG/ML IJ SOLN
INTRAMUSCULAR | Status: AC
  Filled 2017-01-04: qty 1

## 2017-01-04 MED ORDER — EPHEDRINE SULFATE 50 MG/ML IJ SOLN
INTRAMUSCULAR | Status: AC
  Filled 2017-01-04: qty 1

## 2017-01-04 MED ORDER — LACTATED RINGERS IV SOLN
INTRAVENOUS | Status: DC | PRN
  Administered 2017-01-04: 08:00:00 via INTRAVENOUS

## 2017-01-04 MED ORDER — GLYCOPYRROLATE 0.2 MG/ML IJ SOLN
INTRAMUSCULAR | Status: AC
  Filled 2017-01-04: qty 1

## 2017-01-04 MED ORDER — FENTANYL CITRATE (PF) 100 MCG/2ML IJ SOLN
INTRAMUSCULAR | Status: AC
  Filled 2017-01-04: qty 2

## 2017-01-04 MED ORDER — AMIODARONE HCL 200 MG PO TABS
400.0000 mg | ORAL_TABLET | Freq: Two times a day (BID) | ORAL | Status: DC
  Administered 2017-01-04 – 2017-01-08 (×8): 400 mg via ORAL
  Filled 2017-01-04 (×8): qty 2

## 2017-01-04 SURGICAL SUPPLY — 14 items
BANDAGE ELASTIC 4 LF NS (GAUZE/BANDAGES/DRESSINGS) IMPLANT
CHLORAPREP W/TINT 26ML (MISCELLANEOUS) ×3 IMPLANT
DRSG TEGADERM 2-3/8X2-3/4 SM (GAUZE/BANDAGES/DRESSINGS) ×6 IMPLANT
GAUZE SPONGE NON-WVN 2X2 STRL (MISCELLANEOUS) ×1 IMPLANT
PACK EXTREMITY ARMC (MISCELLANEOUS) IMPLANT
PAD CAST CTTN 4X4 STRL (SOFTGOODS) IMPLANT
PADDING CAST BLEND 3X4 NS (MISCELLANEOUS) ×4 IMPLANT
PADDING CAST BLEND 3X4YD NS (MISCELLANEOUS) ×8
PADDING CAST COTTON 4X4 STRL (SOFTGOODS)
SLING ARM M TX990204 (SOFTGOODS) IMPLANT
SPLINT CAST 1 STEP 4X15 (MISCELLANEOUS) IMPLANT
SPONGE VERSALON 2X2 STRL (MISCELLANEOUS) ×2
STOCKINETTE ORTHO 4X25 (MISCELLANEOUS) ×3 IMPLANT
TAPE CAST 2X4 WHT DELT NS (MISCELLANEOUS) ×12 IMPLANT

## 2017-01-04 NOTE — Anesthesia Preprocedure Evaluation (Addendum)
Anesthesia Evaluation  Patient identified by MRN, date of birth, ID band Patient awake    Reviewed: Allergy & Precautions, NPO status , Patient's Chart, lab work & pertinent test results, reviewed documented beta blocker date and time   Airway Mallampati: II  TM Distance: >3 FB     Dental  (+) Chipped, Missing   Pulmonary pneumonia, resolved, COPD, former smoker,           Cardiovascular + dysrhythmias Atrial Fibrillation      Neuro/Psych    GI/Hepatic PUD,   Endo/Other    Renal/GU      Musculoskeletal   Abdominal   Peds  Hematology   Anesthesia Other Findings Lung ca. Echo normal.  Reproductive/Obstetrics                            Anesthesia Physical Anesthesia Plan  ASA: III  Anesthesia Plan: General   Post-op Pain Management:    Induction: Intravenous  PONV Risk Score and Plan:   Airway Management Planned:   Additional Equipment:   Intra-op Plan:   Post-operative Plan:   Informed Consent: I have reviewed the patients History and Physical, chart, labs and discussed the procedure including the risks, benefits and alternatives for the proposed anesthesia with the patient or authorized representative who has indicated his/her understanding and acceptance.     Plan Discussed with: CRNA  Anesthesia Plan Comments:         Anesthesia Quick Evaluation

## 2017-01-04 NOTE — Progress Notes (Signed)
  Subjective: POST-OP CHECK: Patient reports pain as mild.  She is sitting up in bed with her left arm elevated on pillows. She denies any significant pain. Her grandson and great-granddaughter at the bedside.  Objective:   VITALS:   Vitals:   01/04/17 0906 01/04/17 0915 01/04/17 0952 01/04/17 1343  BP: (!) 98/54  (!) 102/55 (!) 101/56  Pulse: (!) 115 (!) 134 (!) 118 (!) 117  Resp: 13 14  (!) 22  Temp:  (!) 97.1 F (36.2 C) 97.8 F (36.6 C) 98.2 F (36.8 C)  TempSrc:   Oral Oral  SpO2: 94% 96% 94% 98%  Weight:      Height:        PHYSICAL EXAM: Left upper extremity: Cast is in place. It appears well fitting. There is no evidence of skin breakdown. Her fingers well-perfused. She has intact sensation light touch and can flex and extend all 5 digits of the left hand. Neurovascular intact Sensation intact distally  LABS  Results for orders placed or performed during the hospital encounter of 12/31/16 (from the past 24 hour(s))  MRSA PCR Screening     Status: Abnormal   Collection Time: 01/04/17  3:44 AM  Result Value Ref Range   MRSA by PCR POSITIVE (A) NEGATIVE    No results found.  Assessment/Plan: Day of Surgery   Active Problems:   Chest pain   Cellulitis of trunk  Patient doing well postop. Continue elevation the left upper extremity on pillows. Continue neurovascular checks every hour 12 hours postop. Patient may be discharged to rehabilitation from an orthopedic standpoint and will follow up in my office for reevaluation and x-ray in 7-10 days.  Patient is nonweightbearing on the left wrist.    Thornton Park , MD 01/04/2017, 6:11 PM

## 2017-01-04 NOTE — Progress Notes (Signed)
Patient ID: [name], female   DOB: Sep 14, 1932, 83 y.o.   MRN: [id number]   [facility] PROGRESS NOTE  [name] VEL:[id number] DOB: June 24, 1932 DOA: 12/31/2016 PCP: Albina Billet, MD  HPI/Subjective: Patient feeling a lot better. She had her wrist reduced in the operating room.  Heart rate trended highly likely secondary to pain.  Objective: Vitals:   01/04/17 0952 01/04/17 1343  BP: (!) 102/55 (!) 101/56  Pulse: (!) 118 (!) 117  Resp:  (!) 22  Temp: 97.8 F (36.6 C) 98.2 F (36.8 C)  SpO2: 94% 98%    Filed Weights   01/02/17 0821 01/03/17 0500 01/04/17 0500  Weight: 74.8 kg (165 lb) 75.8 kg (167 lb 3.2 oz) 77 kg (169 lb 11.2 oz)    ROS: Review of Systems  Constitutional: Negative for chills and fever.  Eyes: Negative for blurred vision.  Respiratory: Negative for cough and shortness of breath.   Cardiovascular: Negative for chest pain.  Gastrointestinal: Negative for abdominal pain, constipation, diarrhea, nausea and vomiting.  Genitourinary: Negative for dysuria.  Musculoskeletal: Positive for joint pain.  Neurological: Negative for dizziness and headaches.   Exam: Physical Exam  Constitutional: She is oriented to person, place, and time.  HENT:  Nose: No mucosal edema.  Mouth/Throat: No oropharyngeal exudate or posterior oropharyngeal edema.  Eyes: Pupils are equal, round, and reactive to light. Conjunctivae, EOM and lids are normal.  Neck: No JVD present. Carotid bruit is not present. No edema present. No thyroid mass and no thyromegaly present.  Cardiovascular: S1 normal and S2 normal.  An irregularly irregular rhythm present. Exam reveals no gallop.   No murmur heard. Pulses:      Dorsalis pedis pulses are 2+ on the right side, and 2+ on the left side.  Respiratory: No respiratory distress. She has decreased breath sounds in the left lower field. She has no wheezes. She has no rhonchi. She has no rales.  GI: Soft. Bowel sounds are normal. There is no  tenderness.  Musculoskeletal:       Right ankle: She exhibits no swelling.       Left ankle: She exhibits no swelling.  Left wrist in splint  Lymphadenopathy:    She has no cervical adenopathy.  Neurological: She is alert and oriented to person, place, and time. No cranial nerve deficit.  Able to wiggle fingers left hand  Skin: Skin is warm. Nails show no clubbing.  Erythema under the right breast, buttock skin folds near the groin and lower abdomen.   Psychiatric: She has a normal mood and affect.      Data Reviewed: Basic Metabolic Panel:  Recent Labs Lab 12/31/16 1519 01/01/17 0424 01/03/17 0413  NA 133* 133*  --   K 3.9 3.7  --   CL 97* 98*  --   CO2 27 25  --   GLUCOSE 113* 154*  --   BUN 16 13  --   CREATININE 0.70 0.68 0.77  CALCIUM 8.2* 7.6*  --    Liver Function Tests:  Recent Labs Lab 12/31/16 1519  AST 18  ALT 14  ALKPHOS 97  BILITOT 1.2  PROT 6.0*  ALBUMIN 2.5*   CBC:  Recent Labs Lab 12/31/16 1519  WBC 4.8  NEUTROABS 4.0  HGB 11.0*  HCT 32.3*  MCV 82.0  PLT 187   Cardiac Enzymes:  Recent Labs Lab 12/31/16 1519  TROPONINI <0.03   BNP (last 3 results)  Recent Labs  11/12/16 1439  BNP 88.0      Studies: Dg Wrist Complete Left  Result Date: 01/02/2017 CLINICAL DATA:  Fall with bruising EXAM: LEFT WRIST - COMPLETE 3+ VIEW COMPARISON:  None. FINDINGS: Diffuse osteopenia. Comminuted, impacted intra-articular distal radius fracture. About 1/2 shaft diameter of dorsal displacement of distal fracture fragment and moderate dorsal angulation. Comminuted, impacted distal radius fracture with mild dorsal angulation. IMPRESSION: 1. Osteopenia 2. Comminuted, impacted intra-articular distal radius fracture with displacement and angulation. 3. Comminuted, slightly angulated distal ulna fracture. Electronically Signed   By: Donavan Foil M.D.   On: 01/02/2017 14:32   Ct Head Wo Contrast  Result Date: 01/02/2017 CLINICAL DATA:  Recent trip  and fall with head injury, no neuro deficit, initial encounter EXAM: CT HEAD WITHOUT CONTRAST TECHNIQUE: Contiguous axial images were obtained from the base of the skull through the vertex without intravenous contrast. COMPARISON:  None. FINDINGS: Brain: Mild atrophic changes are noted. No findings to suggest acute hemorrhage, acute infarction or space-occupying mass lesion are noted. Vascular: No hyperdense vessel or unexpected calcification. Skull: Normal. Negative for fracture or focal lesion. Sinuses/Orbits: No acute finding. Other: None. IMPRESSION: Mild atrophic changes without acute intracranial abnormality. Electronically Signed   By: Inez Catalina M.D.   On: 01/02/2017 14:18    Scheduled Meds: . albuterol  2.5 mg Nebulization BID  . amiodarone  200 mg Oral BID  . apixaban  5 mg Oral BID  . budesonide (PULMICORT) nebulizer solution  0.5 mg Nebulization BID  . Chlorhexidine Gluconate Cloth  6 each Topical Q0600  . cholecalciferol  1,000 Units Oral Daily  . docusate sodium  100 mg Oral BID  . doxycycline  100 mg Oral Q12H  . fentaNYL      . fluconazole  100 mg Oral Daily  . lidocaine-prilocaine   Topical Daily  . loratadine  10 mg Oral Daily  . multivitamin with minerals  1 tablet Oral Daily  . mupirocin ointment  1 application Nasal BID  . nystatin   Topical TID  . pantoprazole  40 mg Oral Daily  . potassium chloride SA  40 mEq Oral BID  . protein supplement shake  11 oz Oral BID BM  . senna  1 tablet Oral BID  . temazepam  15 mg Oral QHS    Assessment/Plan:  1. Weakness. Physical therapy and occupational therapy recommended rehabilitation. Awaiting insurance approval. 2. Distal radial fracture. Reduced in the operating room. Pain control with Vicodin. 3. Chest pain related to Pleurex catheter. Pleurex catheter removed and patient has no further chest pain. Culture sent off by Dr. Genevive Bi growing staph aureus. Doxycycline prescribed. 4. Pneumonitis on chest x-ray. On  doxycycline. 5. Fungal infection under skin folds. Started oral Diflucan and continue antifungal pads and nystatin powder under skin folds. 6. Paroxysmal atrial fibrillation. Eliquis restarted. Heart rate likely elevated secondary to pain. Amiodarone continued. Diltiazem stopped secondary to low blood pressure.  7. 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 Follow-up with Dr. Grayland Ormond as outpatient on Thursday 8. Hyponatremia.   Code Status:     Code Status Orders        Start     Ordered   01/01/17 0405  Do not attempt resuscitation (DNR)  Continuous    Question Answer Comment  In the event of cardiac or respiratory ARREST Do not call a "code blue"   In the event of cardiac or respiratory ARREST Do not perform Intubation, CPR, defibrillation or ACLS   In the event of  cardiac or respiratory ARREST Use medication by any route, position, wound care, and other measures to relive pain and suffering. May use oxygen, suction and manual treatment of airway obstruction as needed for comfort.      01/01/17 0404    Code Status History    Date Active Date Inactive Code Status Order ID Comments User Context   01/01/2017  2:21 AM 01/01/2017  4:04 AM Full Code [id number]  Gorden Harms, MD Inpatient   12/18/2016  4:36 PM 12/19/2016  5:57 PM Full Code [id number]  [name], MD Inpatient   11/12/2016  6:12 PM 11/16/2016  6:18 PM Full Code [id number]  Dustin Flock, MD Inpatient     Disposition Plan: physical therapy recommends rehabilitation. Awaiting prior authorization from insurance company now.  Consultants:  Cardiothoracic surgery  Oncology  Orthopedic surgery  Antibiotics:  doxycycline  Time spent: 25 minutes  [place], [place]

## 2017-01-04 NOTE — Clinical Social Work Note (Signed)
PT is recommending SNF, CSW faxed required clinicals to insurance company awaiting insurance authorization.  Jones Broom. Koraima Albertsen, MSW, [place]  01/04/2017 9:27 AM

## 2017-01-04 NOTE — Evaluation (Signed)
Physical Therapy RE-Evaluation Patient Details Name: [name] MRN: [id number] DOB: [birth year] Today's Date: 01/04/2017   History of Present Illness  [name] is an 83yo white female who comes to [facility] on 10/15 c cough, CP, found to have infection of Pleurx drain. Pt underwent drain removal on 10/16, but sustained a fall in room thereafter, and is now with an acute Lt wrist fracture, now back from OR with LUE casted today. Pt lives alone, formerly fully independent in ADL, IADL, but getting additional help from family as she has been undergtoign CA tratments with chronic pleural effusions.  Pt is now s/p closed reduction of L wrist fracture this date. New order received.  Clinical Impression  Pt is a pleasant 83 year old female who was admitted for chest pain with pleurx drain that sustained fall. Now s/p closed reduction of fx. Re-evaluation performed this date. Pt performs bed mobility with supervision, transfers with cga, and ambulation with min assist and no AD. Did reach out for furniture and needs hands on assist from therapist for balance during ambulation. Pt demonstrates deficits with strength/balance/endurance. Would benefit from skilled PT to address above deficits and promote optimal return to PLOF; recommend transition to STR upon discharge from acute hospitalization. Updated frequency to QD secondary to change in dx.       Follow Up Recommendations SNF    Equipment Recommendations  Cane    Recommendations for Other Services       Precautions / Restrictions Precautions Precautions: Fall Restrictions Weight Bearing Restrictions: Yes LUE Weight Bearing: Non weight bearing      Mobility  Bed Mobility Overal bed mobility: Needs Assistance Bed Mobility: Supine to Sit     Supine to sit: Supervision     General bed mobility comments: safe technique performed with mobility performed towards R side. Uses railing for transfer  Transfers Overall transfer level: Needs  assistance Equipment used: None Transfers: Sit to/from Stand Sit to Stand: Min guard         General transfer comment: improved technique performed compared to previous session. Upright posture noted, however reaches out for furniture for stability  Ambulation/Gait Ambulation/Gait assistance: Min assist Ambulation Distance (Feet): 40 Feet Assistive device: None Gait Pattern/deviations: Step-to pattern     General Gait Details: ambulated to door twice with unsteadiness noted. Needs min assist for balance. Would trial [facility] for further mobility. Feels that legs are shaky. Short step length  Stairs            Wheelchair Mobility    Modified Rankin (Stroke Patients Only)       Balance Overall balance assessment: History of Falls;Needs assistance Sitting-balance support: Feet supported Sitting balance-Leahy Scale: Good     Standing balance support: Single extremity supported Standing balance-Leahy Scale: Fair                               Pertinent Vitals/Pain Pain Assessment: 0-10 Pain Score: 5  Pain Location: Left wrist  Pain Descriptors / Indicators: Aching Pain Intervention(s): Limited activity within patient's tolerance    Home Living Family/patient expects to be discharged to:: Private residence Living Arrangements: Alone Available Help at Discharge: Family Type of Home: House Home Access: Stairs to enter Entrance Stairs-Rails: None Entrance Stairs-Number of Steps: 5 Home Layout: Two level;Other (Comment);1/2 bath on main level Home Equipment: None Additional Comments: family uses a tranport chair when going to medical appointments.     Prior Function  Level of Independence: Independent         Comments: independent with ADL, IADL, driving, taking no medications, enjoys playing bridge with friends     Hand Dominance        Extremity/Trunk Assessment   Upper Extremity Assessment Upper Extremity Assessment: Generalized weakness (B  UE grossly 3/5; painful with movement)    Lower Extremity Assessment Lower Extremity Assessment: Generalized weakness (B LE grossly 3+/5)       Communication   Communication: HOH  Cognition Arousal/Alertness: Awake/alert Behavior During Therapy: WFL for tasks assessed/performed Overall Cognitive Status: Within Functional Limits for tasks assessed                                        General Comments      Exercises Other Exercises Other Exercises: reviewed sling management and positioning for swelling and comfort on L arm. Also performed bed mobility and discussed easy of transitions with 1 extremity.   Assessment/Plan    PT Assessment Patient needs continued PT services  PT Problem List Decreased activity tolerance;Decreased strength;Decreased mobility;Decreased balance       PT Treatment Interventions DME instruction;Gait training;Functional mobility training;Stair training;Therapeutic activities;Therapeutic exercise;Balance training;Patient/family education;Wheelchair mobility training    PT Goals (Current goals can be found in the Care Plan section)  Acute Rehab PT Goals Patient Stated Goal: regain independence PT Goal Formulation: With patient Time For Goal Achievement: 01/18/17 Potential to Achieve Goals: Good    Frequency 7X/week   Barriers to discharge Inaccessible home environment      Co-evaluation               AM-PAC PT "6 Clicks" Daily Activity  Outcome Measure Difficulty turning over in bed (including adjusting bedclothes, sheets and blankets)?: Unable Difficulty moving from lying on back to sitting on the side of the bed? : Unable Difficulty sitting down on and standing up from a chair with arms (e.g., wheelchair, bedside commode, etc,.)?: Unable Help needed moving to and from a bed to chair (including a wheelchair)?: A Little Help needed walking in hospital room?: A Little Help needed climbing 3-5 steps with a railing? : A  Lot 6 Click Score: 11    End of Session   Activity Tolerance: Patient tolerated treatment well Patient left: in bed;with bed alarm set (refused chair this date) Nurse Communication: Mobility status PT Visit Diagnosis: Other abnormalities of gait and mobility (R26.89);Difficulty in walking, not elsewhere classified (R26.2)    Time: 6812-7517 PT Time Calculation (min) (ACUTE ONLY): 18 min   Charges:   PT Evaluation $PT Re-evaluation: 1 Re-eval PT Treatments $Therapeutic Activity: 8-22 mins   PT G Codes:   PT G-Codes **NOT FOR INPATIENT CLASS** Functional Assessment Tool Used: AM-PAC 6 Clicks Basic Mobility Functional Limitation: Mobility: Walking and moving around Mobility: Walking and Moving Around Current Status (G0174): At least 60 percent but less than 80 percent impaired, limited or restricted Mobility: Walking and Moving Around Goal Status [phone]): At least 40 percent but less than 60 percent impaired, limited or restricted    Greggory Stallion, PT, DPT [phone]   Oluwadamilola Deliz 01/04/2017, 4:47 PM

## 2017-01-04 NOTE — Progress Notes (Signed)
MRSA swab came back positive. CHG bath given.  Order set for MRSA positive initiated.  Text paged Doctor on call for [facility] and alerted OR nurse. Dorna Bloom RN

## 2017-01-04 NOTE — Anesthesia Post-op Follow-up Note (Signed)
Anesthesia QCDR form completed.        

## 2017-01-04 NOTE — Op Note (Signed)
12/31/2016 - 01/04/2017  8:59 AM  PATIENT:  [name]    PRE-OPERATIVE DIAGNOSIS:  left distal radius fracture  POST-OPERATIVE DIAGNOSIS:  Same  PROCEDURE:  CLOSED REDUCTION LEFT DISTAL RADIUS FRACTURE  SURGEON:  Thornton Park, MD  ANESTHESIA:   General  PREOPERATIVE INDICATIONS:  Shavonda Wiedman Janota is a  83 y.o. female with a diagnosis of left distal radius fracture who has significant angulation of the fracture and would benefit from a closed reduction and cast application.   I discussed the risks and benefits of surgery with the patient and her family. The risks include bruising, swelling, compartment syndrome, failure of the reduction, loss of reduction and the need for further surgery including re-reduction of the left distal radius. They understood these risks and wished to proceed.   OPERATIVE FINDINGS: Dorsally angulated fracture of left distal radius   OPERATIVE PROCEDURE: Patient was met in the preoperative area and had the left upper extremity marked with my initials and the word "yes" within the operative field according the hospital's correct site of surgery protocol. I answered all questions by the patient and her family. Patient was brought to the operating room.  She underwent general anesthesia. A timeout was performed to verify the patient's name, date of birth, medical record number, correct site of surgery and correct procedure to be performed.  Once all in attendance were in agreement case began.  Patient had initial FluoroScan images taken of the fracture. A closed reduction was performed applying a volarly directed force to the distal radial fragment and wrist. The fracture was brought into a neutral position on the sagittal views. Fracture reduction was confirmed on AP and lateral images. A short arm cast was then applied with a 3 point mold at the fracture site. The fracture reduction was confirmed on AP and lateral FluoroScan imaging following cast application. The  fracture was determined to be in a near anatomic position.    The patient was then awoken and brought to the PACU in stable condition. I was present for the entire case. I spoke with the patient's family in the postop consultation room to let them know the case had been performed without complication and the patient was doing well in the recovery room.

## 2017-01-04 NOTE — Evaluation (Signed)
Occupational Therapy Evaluation Patient Details Name: [name] MRN: [id number] DOB: 1932/10/31 Today's Date: 01/04/2017    History of Present Illness Analycia Khokhar is an 83yo white female who comes to [facility] on 10/15 c cough, CP, found to have infection of Pleurx drain. Pt underwent drain removal on 10/16, but sustained a fall in room thereafter, and is now with an acute Lt wrist fracture, now back from OR with LUE casted today. Pt lives alone, formerly fully independent in ADL, IADL, but getting additional help from family as she has been undergtoign CA tratments with chronic pleural effusions.    Clinical Impression   Pt seen for OT evaluation this date. Pt recently back from OR where surgeon was able to perform closed reduction of the L distal radius fracture under general anesthesia with casting. Continue upon transfer orders for OT evaluation in place. Upon assessment, pt very sleepy with granddaughter in the room. Pt very HOH, requests that granddaughter answer questions for her. Granddaughter clarified that pt lives in a 2 story home and the only full bath with a tub/shower is upstairs. Powder room on 1st floor and 5 steps to enter.  Mobility deferred this session per RN, due to pt only recently coming back from OR and pt very groggy. RN in room to remove IV and assess pt. Please see PT evaluation from previous date for detailed mobility. Pt currently very limited in self care skills due to L wrist fracture and NWB'ing status. Pt unsafe to return home at this time given barriers to return with environmental lay out and decreased caregiver assist. Pt will benefit from skilled OT services to address noted impairments and functional deficits. Recommend STR prior to return home in order to maximize return to PLOF and minimize risk of future falls, injury, and readmission.     Follow Up Recommendations  SNF    Equipment Recommendations  3 in 1 bedside commode;Other (comment) Management consultant)     Recommendations for Other Services       Precautions / Restrictions Precautions Precautions: Fall Restrictions Weight Bearing Restrictions: Yes LUE Weight Bearing: Non weight bearing      Mobility Bed Mobility               General bed mobility comments: deferred due to safety, pt very recently back from OR, very groggy, RN requested to defer  Transfers                 General transfer comment: deferred due to safety, pt very recently back from [place], very groggy, RN requested to defer    Balance                                           ADL either performed or assessed with clinical judgement   ADL Overall ADL's : Needs assistance/impaired Eating/Feeding: Bed level;Set up   Grooming: Bed level;Set up   Upper Body Bathing: Bed level;Moderate assistance   Lower Body Bathing: Bed level;Maximal assistance;Moderate assistance   Upper Body Dressing : Bed level;Maximal assistance   Lower Body Dressing: Bed level;Maximal assistance     Toilet Transfer Details (indicate cue type and reason): deferred due to safety, pt very recently back from [place] Patient Visual Report: No change from baseline  Perception     Praxis      Pertinent Vitals/Pain Pain Assessment: No/denies pain     Hand Dominance Right   Extremity/Trunk Assessment Upper Extremity Assessment Upper Extremity Assessment: LUE deficits/detail;Generalized weakness LUE Deficits / Details: LUE immobilized with sholder sling LUE: Unable to fully assess due to immobilization   Lower Extremity Assessment Lower Extremity Assessment: Generalized weakness   Cervical / Trunk Assessment Cervical / Trunk Assessment: Normal   Communication Communication Communication: HOH   Cognition Arousal/Alertness: Lethargic (very sleepy, recently back from OR) Behavior During Therapy: WFL for tasks assessed/performed Overall Cognitive Status: Within  Functional Limits for tasks assessed                                     General Comments  LUE with cast and shoulder sling in place    Exercises     Shoulder Instructions      Home Living Family/patient expects to be discharged to:: Private residence Living Arrangements: Alone Available Help at Discharge: Family (granddaughter, daughter, and cousin nearby) Type of Home: House Home Access: Stairs to enter Technical brewer of Steps: 5 Entrance Stairs-Rails: None Home Layout: Two level;Other (Comment);1/2 bath on main level (bedroom moved downstairs, only tub/shower is upstairs) Alternate Level Stairs-Number of Steps: approx 15 steps   Bathroom Shower/Tub: Teacher, early years/pre: Handicapped height     Home Equipment: None          Prior Functioning/Environment Level of Independence: Independent        Comments: independent with ADL, IADL, driving, taking no medications, enjoys playing bridge with friends        OT Problem List: Decreased strength;Decreased range of motion;Decreased activity tolerance;Impaired balance (sitting and/or standing);Decreased knowledge of use of DME or AE;Impaired UE functional use      OT Treatment/Interventions: Self-care/ADL training;Therapeutic exercise;Therapeutic activities;DME and/or AE instruction;Patient/family education    OT Goals(Current goals can be found in the care plan section) Acute Rehab OT Goals Patient Stated Goal: regain independence OT Goal Formulation: With patient/family Time For Goal Achievement: 01/18/17 Potential to Achieve Goals: Good  OT Frequency: Min 2X/week   Barriers to D/C: Inaccessible home environment;Decreased caregiver support          Co-evaluation              AM-PAC PT "6 Clicks" Daily Activity     Outcome Measure Help from another person eating meals?: A Little Help from another person taking care of personal grooming?: A Little Help from another  person toileting, which includes using toliet, bedpan, or urinal?: A Lot Help from another person bathing (including washing, rinsing, drying)?: A Lot Help from another person to put on and taking off regular upper body clothing?: A Lot Help from another person to put on and taking off regular lower body clothing?: A Lot 6 Click Score: 14   End of Session    Activity Tolerance: Patient limited by fatigue Patient left: in bed;with call bell/phone within reach;with bed alarm set;with nursing/sitter in room;with family/visitor present  OT Visit Diagnosis: Other abnormalities of gait and mobility (R26.89);History of falling (Z91.81)                Time: 1013-1030 OT Time Calculation (min): 17 min Charges:  OT General Charges $OT Visit: 1 Visit OT Evaluation $OT Eval Low Complexity: 1 Low G-Codes: OT G-codes **NOT FOR INPATIENT CLASS** Functional Assessment Tool Used:  AM-PAC 6 Clicks Daily Activity;Clinical judgement Functional Limitation: Self care Self Care Current Status [phone]): At least 40 percent but less than 60 percent impaired, limited or restricted Self Care Goal Status (N6295): At least 20 percent but less than 40 percent impaired, limited or restricted   Jeni Salles, MPH, MS, OTR/L ascom [phone] 01/04/17, 10:42 AM

## 2017-01-04 NOTE — Transfer of Care (Signed)
Immediate Anesthesia Transfer of Care Note  Patient: Cadience Bradfield Rickert  Procedure(s) Performed: CLOSED REDUCTION WRIST (Left Wrist)  Patient Location: PACU  Anesthesia Type:General  Level of Consciousness: drowsy and patient cooperative  Airway & Oxygen Therapy: Patient Spontanous Breathing and Patient connected to nasal cannula oxygen  Post-op Assessment: Report given to RN and Post -op Vital signs reviewed and stable  Post vital signs: Reviewed and stable  Last Vitals:  Vitals:   01/04/17 0406 01/04/17 0837  BP: (!) 119/45 (!) 92/56  Pulse: (!) 120 (!) 108  Resp: 20 15  Temp: 36.7 C (!) 35.9 C  SpO2: 95% 100%    Last Pain:  Vitals:   01/04/17 0837  TempSrc: Temporal  PainSc: 0-No pain      Patients Stated Pain Goal: 1 (72/09/47 0962)  Complications: No apparent anesthesia complications

## 2017-01-04 NOTE — Progress Notes (Signed)
PT Cancellation Note  Patient Details Name: [name] MRN: [id number] DOB: 08-05-1932   Cancelled Treatment:    Reason Eval/Treat Not Completed: Patient at procedure or test/unavailable. Pt currently in OR for possible closed reduction vs ORIF of L wrist fx. Will hold at this time. Will need new order if pt undergoes general anesthesia secondary to change in status.   Haiven Nardone 01/04/2017, 8:11 AM  Greggory Stallion, PT, DPT [phone]

## 2017-01-04 NOTE — Progress Notes (Signed)
Medications administered by student RN [id number] with supervision of Clinical Instructor Alyra Patty MSN, RN-BC or patient's assigned RN.   

## 2017-01-04 NOTE — OR Nursing (Signed)
Floor nurse Judson Roch Notified,OR RN MRSA PCR positive, asked if anything else was needed. Explained to follow hospital policy and procedure and to notify MD, OR likes to be aware but we do not place orders. RN acknowledged.

## 2017-01-04 NOTE — Anesthesia Postprocedure Evaluation (Signed)
Anesthesia Post Note  Patient: [name]  Procedure(s) Performed: CLOSED REDUCTION WRIST (Left Wrist)  Patient location during evaluation: PACU Anesthesia Type: General Level of consciousness: awake and alert Pain management: pain level controlled Vital Signs Assessment: post-procedure vital signs reviewed and stable Respiratory status: spontaneous breathing, nonlabored ventilation, respiratory function stable and patient connected to nasal cannula oxygen Cardiovascular status: blood pressure returned to baseline and stable Postop Assessment: no apparent nausea or vomiting Anesthetic complications: no     Last Vitals:  Vitals:   01/04/17 0915 01/04/17 0952  BP:  (!) 102/55  Pulse: (!) 134 (!) 118  Resp: 14   Temp: (!) 36.2 C 36.6 C  SpO2: 96% 94%    Last Pain:  Vitals:   01/04/17 0952  TempSrc: Oral  PainSc:                  Dorla Guizar S

## 2017-01-04 NOTE — Anesthesia Procedure Notes (Signed)
Date/Time: 01/04/2017 8:06 AM Performed by: Darlyne Russian Pre-anesthesia Checklist: Patient identified, Emergency Drugs available, Suction available, Patient being monitored and Timeout performed Patient Re-evaluated:Patient Re-evaluated prior to induction Oxygen Delivery Method: Nasal cannula Placement Confirmation: positive ETCO2

## 2017-01-05 ENCOUNTER — Other Ambulatory Visit: Payer: Self-pay

## 2017-01-05 DIAGNOSIS — Z90710 Acquired absence of both cervix and uterus: Secondary | ICD-10-CM | POA: Diagnosis not present

## 2017-01-05 DIAGNOSIS — Z79899 Other long term (current) drug therapy: Secondary | ICD-10-CM | POA: Diagnosis not present

## 2017-01-05 DIAGNOSIS — Z823 Family history of stroke: Secondary | ICD-10-CM | POA: Diagnosis not present

## 2017-01-05 DIAGNOSIS — I4891 Unspecified atrial fibrillation: Secondary | ICD-10-CM | POA: Diagnosis present

## 2017-01-05 DIAGNOSIS — Z961 Presence of intraocular lens: Secondary | ICD-10-CM | POA: Diagnosis present

## 2017-01-05 DIAGNOSIS — L03313 Cellulitis of chest wall: Secondary | ICD-10-CM | POA: Diagnosis present

## 2017-01-05 DIAGNOSIS — Z888 Allergy status to other drugs, medicaments and biological substances status: Secondary | ICD-10-CM | POA: Diagnosis not present

## 2017-01-05 DIAGNOSIS — J189 Pneumonia, unspecified organism: Secondary | ICD-10-CM | POA: Diagnosis present

## 2017-01-05 DIAGNOSIS — S52572A Other intraarticular fracture of lower end of left radius, initial encounter for closed fracture: Secondary | ICD-10-CM | POA: Diagnosis not present

## 2017-01-05 DIAGNOSIS — Z882 Allergy status to sulfonamides status: Secondary | ICD-10-CM | POA: Diagnosis not present

## 2017-01-05 DIAGNOSIS — Z9221 Personal history of antineoplastic chemotherapy: Secondary | ICD-10-CM | POA: Diagnosis not present

## 2017-01-05 DIAGNOSIS — Z8711 Personal history of peptic ulcer disease: Secondary | ICD-10-CM | POA: Diagnosis not present

## 2017-01-05 DIAGNOSIS — Z9841 Cataract extraction status, right eye: Secondary | ICD-10-CM | POA: Diagnosis not present

## 2017-01-05 DIAGNOSIS — W010XXA Fall on same level from slipping, tripping and stumbling without subsequent striking against object, initial encounter: Secondary | ICD-10-CM | POA: Diagnosis not present

## 2017-01-05 DIAGNOSIS — C3492 Malignant neoplasm of unspecified part of left bronchus or lung: Secondary | ICD-10-CM | POA: Diagnosis present

## 2017-01-05 DIAGNOSIS — T8149XA Infection following a procedure, other surgical site, initial encounter: Secondary | ICD-10-CM | POA: Diagnosis present

## 2017-01-05 DIAGNOSIS — Z9889 Other specified postprocedural states: Secondary | ICD-10-CM | POA: Diagnosis not present

## 2017-01-05 DIAGNOSIS — Y92230 Patient room in hospital as the place of occurrence of the external cause: Secondary | ICD-10-CM | POA: Diagnosis not present

## 2017-01-05 DIAGNOSIS — J910 Malignant pleural effusion: Secondary | ICD-10-CM | POA: Diagnosis present

## 2017-01-05 DIAGNOSIS — I480 Paroxysmal atrial fibrillation: Secondary | ICD-10-CM | POA: Diagnosis present

## 2017-01-05 DIAGNOSIS — Z9842 Cataract extraction status, left eye: Secondary | ICD-10-CM | POA: Diagnosis not present

## 2017-01-05 DIAGNOSIS — Z8249 Family history of ischemic heart disease and other diseases of the circulatory system: Secondary | ICD-10-CM | POA: Diagnosis not present

## 2017-01-05 DIAGNOSIS — Z9049 Acquired absence of other specified parts of digestive tract: Secondary | ICD-10-CM | POA: Diagnosis not present

## 2017-01-05 DIAGNOSIS — E871 Hypo-osmolality and hyponatremia: Secondary | ICD-10-CM | POA: Diagnosis present

## 2017-01-05 DIAGNOSIS — J15212 Pneumonia due to Methicillin resistant Staphylococcus aureus: Secondary | ICD-10-CM | POA: Diagnosis present

## 2017-01-05 DIAGNOSIS — Z87891 Personal history of nicotine dependence: Secondary | ICD-10-CM | POA: Diagnosis not present

## 2017-01-05 DIAGNOSIS — B9562 Methicillin resistant Staphylococcus aureus infection as the cause of diseases classified elsewhere: Secondary | ICD-10-CM | POA: Diagnosis present

## 2017-01-05 DIAGNOSIS — J440 Chronic obstructive pulmonary disease with acute lower respiratory infection: Secondary | ICD-10-CM | POA: Diagnosis present

## 2017-01-05 LAB — CBC
HEMATOCRIT: 27.1 % — AB (ref 35.0–47.0)
HEMOGLOBIN: 9.2 g/dL — AB (ref 12.0–16.0)
MCH: 27.9 pg (ref 26.0–34.0)
MCHC: 33.9 g/dL (ref 32.0–36.0)
MCV: 82.4 fL (ref 80.0–100.0)
Platelets: 211 10*3/uL (ref 150–440)
RBC: 3.28 MIL/uL — AB (ref 3.80–5.20)
RDW: 16.4 % — ABNORMAL HIGH (ref 11.5–14.5)
WBC: 3 10*3/uL — ABNORMAL LOW (ref 3.6–11.0)

## 2017-01-05 LAB — BASIC METABOLIC PANEL
Anion gap: 4 — ABNORMAL LOW (ref 5–15)
BUN: 13 mg/dL (ref 6–20)
CHLORIDE: 105 mmol/L (ref 101–111)
CO2: 25 mmol/L (ref 22–32)
CREATININE: 0.86 mg/dL (ref 0.44–1.00)
Calcium: 8.3 mg/dL — ABNORMAL LOW (ref 8.9–10.3)
GFR calc Af Amer: >60 mL/min (ref >60)
GFR calc non Af Amer: >60 mL/min (ref >60)
GLUCOSE: 107 mg/dL — AB (ref 65–99)
POTASSIUM: 5.2 mmol/L — AB (ref 3.5–5.1)
SODIUM: 134 mmol/L — AB (ref 135–145)

## 2017-01-05 MED ORDER — SODIUM CHLORIDE 0.9 % IV BOLUS (SEPSIS)
500.0000 mL | Freq: Once | INTRAVENOUS | Status: AC
  Administered 2017-01-05: 08:00:00 500 mL via INTRAVENOUS

## 2017-01-05 MED ORDER — DILTIAZEM HCL ER COATED BEADS 120 MG PO CP24
120.0000 mg | ORAL_CAPSULE | Freq: Every day | ORAL | Status: DC
  Administered 2017-01-05 – 2017-01-08 (×3): 120 mg via ORAL
  Filled 2017-01-05 (×4): qty 1

## 2017-01-05 NOTE — Progress Notes (Signed)
Patient ID: [name], female   DOB: 1932/09/28, 83 y.o.   MRN: [id number]  [facility] PROGRESS NOTE  [name] IOM:[id number] DOB: [birth year] DOA: 12/31/2016 PCP: Albina Billet, MD  HPI/Subjective: Patient very upset about things. Patient has had one thing after another.Patient in atrial fibrillation.  Objective: Vitals:   01/05/17 0743 01/05/17 1240  BP:  (!) 83/49  Pulse:  (!) 105  Resp:  20  Temp:  98.7 F (37.1 C)  SpO2: 94% 95%    Filed Weights   01/02/17 0821 01/03/17 0500 01/04/17 0500  Weight: 74.8 kg (165 lb) 75.8 kg (167 lb 3.2 oz) 77 kg (169 lb 11.2 oz)    ROS: Review of Systems  Constitutional: Negative for chills and fever.  Eyes: Negative for blurred vision.  Respiratory: Negative for cough and shortness of breath.   Cardiovascular: Positive for palpitations. Negative for chest pain.  Gastrointestinal: Negative for abdominal pain, constipation, diarrhea, nausea and vomiting.  Genitourinary: Negative for dysuria.  Musculoskeletal: Positive for joint pain.  Neurological: Negative for dizziness and headaches.   Exam: Physical Exam  Constitutional: She is oriented to person, place, and time.  HENT:  Nose: No mucosal edema.  Mouth/Throat: No oropharyngeal exudate or posterior oropharyngeal edema.  Eyes: Pupils are equal, round, and reactive to light. Conjunctivae, EOM and lids are normal.  Neck: No JVD present. Carotid bruit is not present. No edema present. No thyroid mass and no thyromegaly present.  Cardiovascular: S1 normal and S2 normal.  An irregularly irregular rhythm present. Tachycardia present.  Exam reveals no gallop.   No murmur heard. Pulses:      Dorsalis pedis pulses are 2+ on the right side, and 2+ on the left side.  Respiratory: No respiratory distress. She has decreased breath sounds in the left lower field. She has no wheezes. She has no rhonchi. She has no rales.  GI: Soft. Bowel sounds are normal. There is no tenderness.   Musculoskeletal:       Right ankle: She exhibits no swelling.       Left ankle: She exhibits no swelling.  Left wrist in splint  Lymphadenopathy:    She has no cervical adenopathy.  Neurological: She is alert and oriented to person, place, and time. No cranial nerve deficit.  Able to wiggle fingers left hand  Skin: Skin is warm. Nails show no clubbing.  Erythema under the right breast, buttock skin folds near the groin and lower abdomen.   Psychiatric: She has a normal mood and affect.      Data Reviewed: Basic Metabolic Panel:  Recent Labs Lab 12/31/16 1519 01/01/17 0424 01/03/17 0413 01/05/17 0414  NA 133* 133*  --  134*  K 3.9 3.7  --  5.2*  CL 97* 98*  --  105  CO2 27 25  --  25  GLUCOSE 113* 154*  --  107*  BUN 16 13  --  13  CREATININE 0.70 0.68 0.77 0.86  CALCIUM 8.2* 7.6*  --  8.3*   Liver Function Tests:  Recent Labs Lab 12/31/16 1519  AST 18  ALT 14  ALKPHOS 97  BILITOT 1.2  PROT 6.0*  ALBUMIN 2.5*   CBC:  Recent Labs Lab 12/31/16 1519 01/05/17 0414  WBC 4.8 3.0*  NEUTROABS 4.0  --   HGB 11.0* 9.2*  HCT 32.3* 27.1*  MCV 82.0 82.4  PLT 187 211   Cardiac Enzymes:  Recent Labs Lab 12/31/16 1519  TROPONINI <0.03  BNP (last 3 results)  Recent Labs  11/12/16 1439  BNP 88.0      Studies: No results found.  Scheduled Meds: . albuterol  2.5 mg Nebulization BID  . amiodarone  400 mg Oral BID  . apixaban  5 mg Oral BID  . budesonide (PULMICORT) nebulizer solution  0.5 mg Nebulization BID  . Chlorhexidine Gluconate Cloth  6 each Topical Q0600  . cholecalciferol  1,000 Units Oral Daily  . diltiazem  120 mg Oral Daily  . docusate sodium  100 mg Oral BID  . doxycycline  100 mg Oral Q12H  . fluconazole  100 mg Oral Daily  . lidocaine-prilocaine   Topical Daily  . loratadine  10 mg Oral Daily  . multivitamin with minerals  1 tablet Oral Daily  . mupirocin ointment  1 application Nasal BID  . nystatin   Topical TID  .  pantoprazole  40 mg Oral Daily  . protein supplement shake  11 oz Oral BID BM  . senna  1 tablet Oral BID  . temazepam  15 mg Oral QHS    Assessment/Plan:  1. Atrial fibrillation with rapid ventricular response. Patient on Eliquis for anticoagulation. Patient switched to high-dose amiodarone 400 mg twice a day. Case discussed with Dr. Nehemiah Massed cardiology and he recommended restarting Cardizem CD low-dose. 2. Weakness. Physical therapy and occupational therapy recommended rehabilitation. Awaiting insurance approval and acceptances from rehabilitation facilities. Facilities are hesitant to accept because the patient is undergoing chemotherapy. Care manager trying to reach oncologist about whether or not we can hold chemotherapy while patient at rehabilitation. 3. Distal radial fracture. Reduced in the operating room. Pain control with Vicodin. 4. Chest pain related to Pleurex catheter. Pleurex catheter removed and patient has no further chest pain. Culture sent off by Dr. Genevive Bi growing MRSA. Doxycycline prescribed. 5. Pneumonitis on chest x-ray. On doxycycline. 6. Fungal infection under skin folds. Started oral Diflucan and continue antifungal pads and nystatin powder under skin folds. 7. 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 Follow-up with Dr. Grayland Ormond needed 8. Hyponatremia.   Code Status:     Code Status Orders        Start     Ordered   01/01/17 0405  Do not attempt resuscitation (DNR)  Continuous    Question Answer Comment  In the event of cardiac or respiratory ARREST Do not call a "code blue"   In the event of cardiac or respiratory ARREST Do not perform Intubation, CPR, defibrillation or ACLS   In the event of cardiac or respiratory ARREST Use medication by any route, position, wound care, and other measures to relive pain and suffering. May use oxygen, suction and manual treatment of airway obstruction as needed for comfort.      01/01/17 0404    Code Status History     Date Active Date Inactive Code Status Order ID Comments User Context   01/01/2017  2:21 AM 01/01/2017  4:04 AM Full Code [id number]  Gorden Harms, MD Inpatient   12/18/2016  4:36 PM 12/19/2016  5:57 PM Full Code [id number]  [name], MD Inpatient   11/12/2016  6:12 PM 11/16/2016  6:18 PM Full Code [id number]  Dustin Flock, MD Inpatient     Disposition Plan: physical therapy recommends rehabilitation. Awaiting prior authorization from insurance company now.  Consultants:  Cardiothoracic surgery  Oncology  Orthopedic surgery  Antibiotics:  doxycycline  Time spent: 30 minutes, case discussed with granddaughter.  Loletha Grayer  [employer]

## 2017-01-05 NOTE — Consult Note (Signed)
[facility] Consultation Note  Patient ID: [name], MRN: [id number], DOB/AGE: 06/03/1932 83 y.o. Admit date: 12/31/2016   Date of Consult: 01/05/2017 Primary Physician: Albina Billet, MD Primary Cardiologist:None  Chief Complaint:  Chief Complaint  Patient presents with  . Cough  . Weakness   Reason for Consult: atrial fibrillation with rapid ventricular rate  HPI: 83 y.o. female with known lung cancer chronic obstructive pulmonary disease essential hypertension makes hyperlipidemia who has had recent fall and wrist fracture for which the patient has done well with surgical intervention. Status post surgical intervention the patient has had a new onset of atrial fibrillation with rapid ventricular rate at 100 and tender 120 bpm. The patient previously has had a echocardiogram earlier this year with normal LV systolic function and no evidence of significant valvular heart disease. The patient has tolerated this well with no evidence of significant congestive heart failure or myocardial infarction. She does have pain from her surgery but no evidence of other significant symptoms. At this time the patient has been on anticoagulation for further risk reduction in stroke with atrial fibrillation and placed on amiodarone at higher dose for heart rate control although will need better heart rate control prior to possible spontaneous conversion to normal sinus rhythm  Past Medical History:  Diagnosis Date  . COPD (chronic obstructive pulmonary disease) ([place])   . GU (gastric peptic ulcer)   . Lung cancer ([place])   . Malignant pleural effusion   . PAF (paroxysmal atrial fibrillation) ([place]) 10/2016   a. 10/2016 post thoracentesis ; b. 10/2016 Echo: EF 65-70%, mild LVH;  c. Amio/Eliquis initiated (CHA2DSVASc = 3).  . Pneumonia   . Wrist fracture 1993   bilateral      Surgical History:  Past Surgical History:  Procedure Laterality Date  . ABDOMINAL HYSTERECTOMY    . CATARACT  EXTRACTION W/ INTRAOCULAR LENS  IMPLANT, BILATERAL    . CHEST TUBE INSERTION Left 12/18/2016   Procedure: CHEST TUBE INSERTION;  Surgeon: [name], MD;  Location: [facility] ORS;  Service: General;  Laterality: Left;  . CHOLECYSTECTOMY    . CLOSED REDUCTION WRIST FRACTURE Left 01/04/2017   Procedure: CLOSED REDUCTION WRIST;  Surgeon: Thornton Park, MD;  Location: [facility] ORS;  Service: Orthopedics;  Laterality: Left;  . PORTACATH PLACEMENT Left 12/18/2016   Procedure: INSERTION PORT-A-CATH;  Surgeon: [name], MD;  Location: [facility] ORS;  Service: General;  Laterality: Left;  . REMOVAL OF PLEURAL DRAINAGE CATHETER N/A 01/02/2017   Procedure: REMOVAL OF PLEURAL DRAINAGE CATHETER;  Surgeon: [name], MD;  Location: [facility] ORS;  Service: Thoracic;  Laterality: N/A;  . TONSILLECTOMY       Home Meds: Prior to Admission medications   Medication Sig Start Date End Date Taking? Authorizing Provider  apixaban (ELIQUIS) 5 MG TABS tablet Take 1 tablet (5 mg total) by mouth 2 (two) times daily. 11/16/16  Yes [name], MD  cetirizine (ZYRTEC) 10 MG tablet Take 10 mg by mouth daily as needed for allergies.   Yes [provider]  cholecalciferol (VITAMIN D) 1000 units tablet Take 1,000 Units by mouth daily.   Yes [provider]  diltiazem (CARDIZEM CD) 180 MG 24 hr capsule Take 1 capsule (180 mg total) by mouth daily. 12/15/16  Yes [name], NP  furosemide (LASIX) 20 MG tablet Take 1 tablet by mouth daily as needed. For EDEMA 10/07/16  Yes [provider]  hydrOXYzine (ATARAX/VISTARIL) 25 MG tablet Take 1 tablet (25 mg  total) by mouth every 6 (six) hours as needed. For itching. 12/12/16  Yes [name], NP  KLOR-CON M20 20 MEQ tablet Take 40 mEq by mouth 2 (two) times daily. 12/12/16  Yes [provider]  lidocaine-prilocaine (EMLA) cream Apply to affected area once 12/17/16  Yes [name], [name], MD  nystatin-triamcinolone ointment  [facility]) Apply 1 application topically 2 (two) times daily. 12/30/16  Yes [name], MD  omeprazole (PRILOSEC) 20 MG capsule Take 20 mg by mouth daily.   Yes [provider]  ondansetron (ZOFRAN) 8 MG tablet Take 1 tablet (8 mg total) by mouth 2 (two) times daily as needed for refractory nausea / vomiting. 12/17/16  Yes [name], MD  prochlorperazine (COMPAZINE) 10 MG tablet Take 1 tablet (10 mg total) by mouth every 6 (six) hours as needed (Nausea or vomiting). 12/17/16  Yes [name], MD  amiodarone (PACERONE) 200 MG tablet One tablet twice a day for one week then one tablet daily 01/03/17   Loletha Grayer, MD  doxycycline (VIBRA-TABS) 100 MG tablet Take 1 tablet (100 mg total) by mouth every 12 (twelve) hours. 01/02/17   Loletha Grayer, MD  fluconazole (DIFLUCAN) 100 MG tablet Take 1 tablet (100 mg total) by mouth daily. 01/02/17   Loletha Grayer, MD  HYDROcodone-acetaminophen (NORCO) 5-325 MG tablet Take 1 tablet by mouth every 4 (four) hours as needed for moderate pain. 01/03/17   Loletha Grayer, MD  nystatin (MYCOSTATIN/NYSTOP) powder Apply three times a day to red skin folds 01/02/17   Wieting, Richard, MD  protein supplement shake (PREMIER PROTEIN) LIQD Take 325 mLs (11 oz total) by mouth 2 (two) times daily between meals. 01/02/17   Loletha Grayer, MD    Inpatient Medications:  . albuterol  2.5 mg Nebulization BID  . amiodarone  400 mg Oral BID  . apixaban  5 mg Oral BID  . budesonide (PULMICORT) nebulizer solution  0.5 mg Nebulization BID  . Chlorhexidine Gluconate Cloth  6 each Topical Q0600  . cholecalciferol  1,000 Units Oral Daily  . diltiazem  120 mg Oral Daily  . docusate sodium  100 mg Oral BID  . doxycycline  100 mg Oral Q12H  . fluconazole  100 mg Oral Daily  . lidocaine-prilocaine   Topical Daily  . loratadine  10 mg Oral Daily  . multivitamin with minerals  1 tablet Oral Daily  . mupirocin ointment  1 application Nasal BID  .  nystatin   Topical TID  . pantoprazole  40 mg Oral Daily  . protein supplement shake  11 oz Oral BID BM  . senna  1 tablet Oral BID  . temazepam  15 mg Oral QHS     Allergies:  Allergies  Allergen Reactions  . Sulfa Antibiotics Other (See Comments)    Seizures   . Levaquin [Levofloxacin] Hives and Itching    Social History   Social History  . Marital status: Widowed    Spouse name: N/A  . Number of children: N/A  . Years of education: N/A   Occupational History  . Not on file.   Social History Main Topics  . Smoking status: Former Smoker    Packs/day: 1.00    Years: 68.00    Quit date: 11/06/2016  . Smokeless tobacco: Never Used  . Alcohol use No  . Drug use: No  . Sexual activity: No   Other Topics Concern  . Not on file   Social History Narrative  . No  narrative on file     Family History  Problem Relation Age of Onset  . Hypertension Father   . Heart disease Father   . Heart attack Father   . Congestive Heart Failure Mother   . Stroke Mother   . Atrial fibrillation Sister      Review of Systems Positive forArm pain palpitations Negative for: General:  chills, fever, night sweats or weight changes.  Cardiovascular: PND orthopnea syncope dizziness  Dermatological skin lesions rashes Respiratory: Cough congestion Urologic: Frequent urination urination at night and hematuria Abdominal: negative for nausea, vomiting, diarrhea, bright red blood per rectum, melena, or hematemesis Neurologic: negative for visual changes, and/or hearing changes  All other systems reviewed and are otherwise negative except as noted above.  Labs: No results for input(s): CKTOTAL, CKMB, TROPONINI in the last 72 hours. Lab Results  Component Value Date   WBC 3.0 (L) 01/05/2017   HGB 9.2 (L) 01/05/2017   HCT 27.1 (L) 01/05/2017   MCV 82.4 01/05/2017   PLT 211 01/05/2017    Recent Labs Lab 12/31/16 1519  01/05/17 0414  NA 133*  < > 134*  K 3.9  < > 5.2*  CL 97*  <  > 105  CO2 27  < > 25  BUN 16  < > 13  CREATININE 0.70  < > 0.86  CALCIUM 8.2*  < > 8.3*  PROT 6.0*  --   --   BILITOT 1.2  --   --   ALKPHOS 97  --   --   ALT 14  --   --   AST 18  --   --   GLUCOSE 113*  < > 107*  < > = values in this interval not displayed. No results found for: CHOL, HDL, LDLCALC, TRIG No results found for: DDIMER  Radiology/Studies:  Dg Chest 1 View  Result Date: 12/14/2016 CLINICAL DATA:  Left thoracentesis . EXAM: CHEST 1 VIEW COMPARISON:  12/08/2016. FINDINGS: Interim left left thoracentesis. Removal of significant amount of left pleural fluid with minimal residual effusion. No pneumothorax. Cardiomegaly again noted. Mild bilateral interstitial prominence. Mild component CHF may be present. Pneumonitis cannot be excluded . IMPRESSION: 1. Left thoracentesis with removal of significant amount left pleural fluid. No evidence of pneumothorax . 2. Cardiomegaly with mild bilateral interstitial prominence. Mild component of CHF view present. Mild pneumonitis cannot be excluded . Electronically Signed   By: Marcello Moores  Register   On: 12/14/2016 14:36   Dg Chest 2 View  Result Date: 12/31/2016 CLINICAL DATA:  83 y/o F; shortness of breath. History of lung cancer. EXAM: CHEST  2 VIEW COMPARISON:  12/26/2016 chest radiographs FINDINGS: Left pleural drain in situ. Left port catheter tip projects over the upper SVC. Increase left-sided pleural effusion. Left hemithorax pleural nodularity also appears increased from prior chest radiograph although this may be projectional due to patient rotation. New opacity in the left mid lung zone may represent a focus of pneumonitis. Mildly enlarged cardiac silhouette. Mild reverse S curvature of the thoracic spine. Right upper quadrant cholecystectomy clips. IMPRESSION: 1. Increased left effusion. Apparent increase in pleural nodularity, possibly due to differences in projection. 2. New opacity in left mid lung zone may represent a focus of  pneumonitis. 3. Stable position of left pleural drain and port catheter. Electronically Signed   By: Kristine Garbe M.D.   On: 12/31/2016 17:11   Dg Chest 2 View  Result Date: 12/26/2016 CLINICAL DATA:  Left chest pain.  Pleural effusion.  EXAM: CHEST  2 VIEW COMPARISON:  12/18/2016 FINDINGS: Left Port-A-Cath remains in place, unchanged. PleurX drainage catheter again noted at the left base. No pneumothorax. No visible significant effusion. Heart is normal size. There is hyperinflation of the lungs compatible with COPD. Chronic bibasilar densities likely reflect atelectasis or scarring. IMPRESSION: Left PleurX drainage catheter remains in place.  No pneumothorax. Chronic bibasilar atelectasis or scarring. COPD. Electronically Signed   By: Rolm Baptise M.D.   On: 12/26/2016 08:04   Dg Chest 2 View  Result Date: 12/08/2016 CLINICAL DATA:  Shortness of breath. EXAM: CHEST  2 VIEW COMPARISON:  Radiograph of November 12, 2016. FINDINGS: Stable cardiomegaly. Atherosclerosis of thoracic aorta is noted. No pneumothorax is noted. Large left pleural effusion is noted with probable underlying atelectasis or infiltrate. Minimal right basilar subsegmental atelectasis. Bony thorax is unremarkable. IMPRESSION: Aortic atherosclerosis. Large left pleural effusion is stable with probable underlying atelectasis or infiltrate. Electronically Signed   By: Marijo Conception, M.D.   On: 12/08/2016 13:39   Dg Wrist Complete Left  Result Date: 01/02/2017 CLINICAL DATA:  Fall with bruising EXAM: LEFT WRIST - COMPLETE 3+ VIEW COMPARISON:  None. FINDINGS: Diffuse osteopenia. Comminuted, impacted intra-articular distal radius fracture. About 1/2 shaft diameter of dorsal displacement of distal fracture fragment and moderate dorsal angulation. Comminuted, impacted distal radius fracture with mild dorsal angulation. IMPRESSION: 1. Osteopenia 2. Comminuted, impacted intra-articular distal radius fracture with displacement and  angulation. 3. Comminuted, slightly angulated distal ulna fracture. Electronically Signed   By: Donavan Foil M.D.   On: 01/02/2017 14:32   Ct Head Wo Contrast  Result Date: 01/02/2017 CLINICAL DATA:  Recent trip and fall with head injury, no neuro deficit, initial encounter EXAM: CT HEAD WITHOUT CONTRAST TECHNIQUE: Contiguous axial images were obtained from the base of the skull through the vertex without intravenous contrast. COMPARISON:  None. FINDINGS: Brain: Mild atrophic changes are noted. No findings to suggest acute hemorrhage, acute infarction or space-occupying mass lesion are noted. Vascular: No hyperdense vessel or unexpected calcification. Skull: Normal. Negative for fracture or focal lesion. Sinuses/Orbits: No acute finding. Other: None. IMPRESSION: Mild atrophic changes without acute intracranial abnormality. Electronically Signed   By: Inez Catalina M.D.   On: 01/02/2017 14:18   Ct Biopsy  Result Date: 12/06/2016 INDICATION: Concern for metastatic lung cancer. Please perform CT-guided biopsy for tissue diagnostic purposes. EXAM: CT-GUIDED BIOPSY OF HYPERMETABOLIC LEFT LATERAL CHEST WALL MASS. COMPARISON:  PET-CT - 11/29/2016; chest CT - 11/13/2016 MEDICATIONS: None. ANESTHESIA/SEDATION: Fentanyl 37.5 mcg IV; Versed 1 mg IV Sedation time: 13 minutes; The patient was continuously monitored during the procedure by the interventional radiology nurse under my direct supervision. CONTRAST:  None COMPLICATIONS: None immediate. PROCEDURE: Informed consent was obtained from the patient following an explanation of the procedure, risks, benefits and alternatives. The patient understands,agrees and consents for the procedure. All questions were addressed. A time out was performed prior to the initiation of the procedure. The patient was positioned supine, slightly RPO on the CT table and a limited chest CT was performed for procedural planning demonstrating unchanged size and appearance of the  hypermetabolic mass involving the lateral aspect of the left tenth rib measuring approximately 3.2 x 2.5 cm (image 40, series 3). The operative site was prepped and draped in the usual sterile fashion. Under sterile conditions and local anesthesia, a 17 gauge coaxial needle was advanced into the peripheral aspect of the nodule. Positioning was confirmed with intermittent CT fluoroscopy and followed by the acquisition of  5 core needle biopsies with an 18 gauge core needle biopsy device. The coaxial needle was removed and superficial hemostasis was achieved with manual compression. Limited post procedural chest CT was negative for pneumothorax or additional complication. A dressing was placed. The patient tolerated the procedure well without immediate postprocedural complication. The patient was escorted to have an upright chest radiograph. IMPRESSION: Technically successful CT guided core needle core biopsy of indeterminate hypermetabolic mass involving the lateral aspect of the left tenth rib. Electronically Signed   By: Sandi Mariscal M.D.   On: 12/06/2016 13:41   Dg Chest Port 1 View  Result Date: 12/18/2016 CLINICAL DATA:  Chest tube placement EXAM: PORTABLE CHEST 1 VIEW COMPARISON:  12/14/2016, 12/08/2016, PET-CT 11/29/2016 FINDINGS: Placement of a left-sided central venous port with the tip projecting over the SVC confluence. Interim insertion of left lower chest tube with poorly visible tip, possibly coiled at the left CP angle. Decreased left pleural effusion or thickening. Patchy atelectasis or infiltrate at the left base. Stable cardiomediastinal silhouette. Questionable tiny left apical pneumothorax. IMPRESSION: 1. Insertion of left lower chest drainage tube with poorly visible tip, it is possibly coiled at the left CP angle 2. Decreased left pleural effusion or thickening. Possible tiny left apical pneumothorax. 3. Patchy atelectasis or infiltrate at the left lung base 4. Mild cardiomegaly Electronically  Signed   By: Donavan Foil M.D.   On: 12/18/2016 15:34   Dg C-arm 1-60 Min-no Report  Result Date: 12/18/2016 Fluoroscopy was utilized by the requesting physician.  No radiographic interpretation.   US Thoracentesis Asp Pleural Space W/img Guide  Result Date: 12/14/2016 INDICATION: Malignant left pleural effusion. EXAM: ULTRASOUND GUIDED LEFT THORACENTESIS MEDICATIONS: None. COMPLICATIONS: None immediate. PROCEDURE: An ultrasound guided thoracentesis was thoroughly discussed with the patient and questions answered. The benefits, risks, alternatives and complications were also discussed. The patient understands and wishes to proceed with the procedure. Written consent was obtained. Ultrasound was performed to localize and mark an adequate pocket of fluid in the left chest. The area was then prepped and draped in the normal sterile fashion. 1% Lidocaine was used for local anesthesia. Under ultrasound guidance a 6 Fr Safe-T-Centesis catheter was introduced. Thoracentesis was performed. The catheter was removed and a dressing applied. FINDINGS: A total of approximately 1.3 L of amber colored fluid was removed. IMPRESSION: Successful ultrasound guided left thoracentesis yielding 1.3 L of pleural fluid. Electronically Signed   By: Markus Daft M.D.   On: 12/14/2016 15:37    EKG: Atrial fibrillation with rapid ventricular rate  Weights: Filed Weights   01/02/17 0821 01/03/17 0500 01/04/17 0500  Weight: 74.8 kg (165 lb) 75.8 kg (167 lb 3.2 oz) 77 kg (169 lb 11.2 oz)     Physical Exam: Blood pressure (!) 87/41, pulse (!) 105, temperature 97.9 F (36.6 C), temperature source Oral, resp. rate (!) 26, height 5\' 3"  (1.6 m), weight 77 kg (169 lb 11.2 oz), SpO2 94 %. Body mass index is 30.06 kg/m. General: Well developed, well nourished, in no acute distress. Head eyes ears nose throat: Normocephalic, atraumatic, sclera non-icteric, no xanthomas, nares are without discharge. No apparent thyromegaly and/or  mass  Lungs: Normal respiratory effort.  no wheezes, no rales, no rhonchi.  Heart:Irregular with normal S1 S2. no murmur gallop, no rub, PMI is normal size and placement, carotid upstroke normal without bruit, jugular venous pressure is normal Abdomen: Soft, non-tender, non-distended with normoactive bowel sounds. No hepatomegaly. No rebound/guarding. No obvious abdominal masses. Abdominal aorta is normal  size without bruit Extremities: Trace edema. no cyanosis, no clubbing, no ulcers  Peripheral : 2+ bilateral upper extremity pulses, 2+ bilateral femoral pulses, 2+ bilateral dorsal pedal pulse Neuro: Alert and oriented. No facial asymmetry. No focal deficit. Moves all extremities spontaneously. Musculoskeletal: Normal muscle tone without kyphosis Psych:  Responds to questions appropriately with a normal affect.    Assessment: 83 year old female with paroxysmal nonvalvular atrial fibrillation essential hypertension makes hyperlipidemia with recent fall and arm and wrist fracture status post surgery without evidence of heart failure or myocardial infarction  Plan: 1. Continue amiodarone at 400 mg twice per day for possible control heart rate and spontaneous conversion to normal sinus rhythm and change and adjust dosages as outpatient 2. Add low-dose diltiazem for better heart rate control and help with spontaneous conversion to normal sinus rhythm 3. Elequis for further risk reduction in stroke with atrial fibrillation 4. Further hypertension control and/or lower extremity edema treatment with furosemide and/or ACE inhibitor as able 5. No restrictions to rehabilitation 6. No further cardiac diagnostics necessary at this time  Signed, Corey Skains M.D. [facility] 01/05/2017, 11:19 AM

## 2017-01-05 NOTE — Progress Notes (Signed)
Patients blood pressure 83/49, HR 105. Patient is asymptomatic. MD called and made aware. Per MD give scheduled dose of cardizem. Primary RN made aware of above. Madlyn Frankel, RN

## 2017-01-05 NOTE — Clinical Social Work Note (Signed)
Clinical Social Work Assessment  Patient Details  Name: [name] MRN: [id number] Date of Birth: 1932-12-17  Date of referral:  01/04/17               Reason for consult:  Facility Placement                Permission sought to share information with:  Facility Sport and exercise psychologist, Family Supports Permission granted to share information::  Yes, Verbal Permission Granted  Name::     [place] Daughter [phone]  [phone] or [place] Meek   [phone] or [place], [place] [phone]    Agency::  SNF admissions  Relationship::     Contact Information:     Housing/Transportation Living arrangements for the past 2 months:  Single Family Home Source of Information:  Patient Patient Interpreter Needed:  None Criminal Activity/Legal Involvement Pertinent to Current Situation/Hospitalization:  No - Comment as needed Significant Relationships:  Adult Children, Other Family Members Lives with:  Self Do you feel safe going back to the place where you live?  No Need for family participation in patient care:  No (Coment)  Care giving concerns: Patient would like to go to SNF for short term rehab.   Social Worker assessment / plan:  Patient is an 83 year old female who is alert and oriented x4.  Patient states she has not been to rehab before, CSW explained to patient the role of CSW and the process for looking for placement.  Patient was informed about what she can expect at SNF and the role of the social worker at [facility].  Patient does get chemo 1x a week on Thursday, CSW explained to her that this may limit her options on where she can go to SNF for rehab.  Patient expressed understanding, patient stated if she is unable to get a SNF placement, patient will go home with home health.  CSW informed patient that there were several places that were still pending.  Patient did not express any other questions or concerns about going to SNF.  CSW was given permission  to begin bed search in [place].  Employment status:  Retired Nurse, adult PT Recommendations:  [place] / Referral to community resources:  [place]  Patient/Family's Response to care:  Patient and family agreeable about going to SNF for short term rehab.  Patient/Family's Understanding of and Emotional Response to Diagnosis, Current Treatment, and Prognosis:  Patient is very positive and hopeful that she will not have to be in SNF for very long.  Emotional Assessment Appearance:  Appears stated age Attitude/Demeanor/Rapport:    Affect (typically observed):  Appropriate, Calm Orientation:  Oriented to Place, Oriented to Self, Oriented to  Time, Oriented to Situation Alcohol / Substance use:  Not Applicable Psych involvement (Current and /or in the community):  No (Comment)  Discharge Needs  Concerns to be addressed:  Lack of Support Readmission within the last 30 days:  No Current discharge risk:  Lack of support system, Lives alone Barriers to Discharge:  Continued Medical Work up   Anell Barr 01/05/2017, 5:50 PM

## 2017-01-05 NOTE — Progress Notes (Signed)
  Subjective:  POD #1  Patient states she went into a fib.  Patient reports left wrist pain as mild.  Her granddaughter is at the bedside.  Objective:   VITALS:   Vitals:   01/04/17 1945 01/04/17 2048 01/05/17 0446 01/05/17 0743  BP:  (!) 98/59 (!) 87/41   Pulse:  (!) 122 (!) 105   Resp:  15 (!) 26   Temp:  98 F (36.7 C) 97.9 F (36.6 C)   TempSrc:  Oral Oral   SpO2: 96% 93% 92% 94%  Weight:      Height:        PHYSICAL EXAM: Left upper extremity:  Patient can flex and extend all digits, has intact sensation to light touch in all fingers and her digits are all well perfused. Neurovascular intact Sensation intact distally  LABS  Results for orders placed or performed during the hospital encounter of 12/31/16 (from the past 24 hour(s))  CBC     Status: Abnormal   Collection Time: 01/05/17  4:14 AM  Result Value Ref Range   WBC 3.0 (L) 3.6 - 11.0 K/uL   RBC 3.28 (L) 3.80 - 5.20 MIL/uL   Hemoglobin 9.2 (L) 12.0 - 16.0 g/dL   HCT 27.1 (L) 35.0 - 47.0 %   MCV 82.4 80.0 - 100.0 fL   MCH 27.9 26.0 - 34.0 pg   MCHC 33.9 32.0 - 36.0 g/dL   RDW 16.4 (H) 11.5 - 14.5 %   Platelets 211 150 - 440 K/uL  Basic metabolic panel     Status: Abnormal   Collection Time: 01/05/17  4:14 AM  Result Value Ref Range   Sodium 134 (L) 135 - 145 mmol/L   Potassium 5.2 (H) 3.5 - 5.1 mmol/L   Chloride 105 101 - 111 mmol/L   CO2 25 22 - 32 mmol/L   Glucose, Bld 107 (H) 65 - 99 mg/dL   BUN 13 6 - 20 mg/dL   Creatinine, Ser 0.86 0.44 - 1.00 mg/dL   Calcium 8.3 (L) 8.9 - 10.3 mg/dL   GFR calc non Af Amer >60 >60 mL/min   GFR calc Af Amer >60 >60 mL/min   Anion gap 4 (L) 5 - 15    No results found.  Assessment/Plan: 1 Day Post-Op   Active Problems:   Chest pain   Cellulitis of trunk   A-fib [facility])  Patient doing well post-op from an orthopaedic standpoint.  Continue elevation of left wrist.  No weight bearing on left wrist.   Follow up in my office in 7-10 days.    Thornton Park  , MD 01/05/2017, 11:48 AM

## 2017-01-05 NOTE — Progress Notes (Signed)
Physical Therapy Treatment Patient Details Name: [name] MRN: [id number] DOB: 16-Dec-1932 Today's Date: 01/05/2017    History of Present Illness [name] is an 83yo white female who comes to [facility] on 10/15 c cough, CP, found to have infection of Pleurx drain. Pt underwent drain removal on 10/16, but sustained a fall in room thereafter, and is now with an acute Lt wrist fracture, now back from OR with LUE casted today. Pt lives alone, formerly fully independent in ADL, IADL, but getting additional help from family as she has been undergtoign CA tratments with chronic pleural effusions.  Pt is now s/p closed reduction of L wrist fracture this date. New order received. Cardio consult for new onset afib w/ RVR, managed with eliquis.     PT Comments    Pt is making progress towards goals. Pt performed there-ex, bed mobility with supervision, transfer/amb with SPC with min assist. Pt amb within room and to bathroom for toileting. Pt utilized [facility] during session with proper technique, recommend use of SPC for transfers/amb for extra support and to minimize fall risk, pt agreed. Re-positioned shoulder sling for comfort and assisted pt to change into new gown. Monitored BP and HR, nursing staff present throughout session for communication of vitals. Pt reported no dizziness or SOB throughout session. Pt appeared motivated to participate in all PT activities.   Follow Up Recommendations  SNF     Equipment Recommendations  Cane    Recommendations for Other Services       Precautions / Restrictions Precautions Precautions: Fall Restrictions Weight Bearing Restrictions: Yes LUE Weight Bearing: Non weight bearing    Mobility  Bed Mobility Overal bed mobility: Needs Assistance Bed Mobility: Supine to Sit     Supine to sit: Supervision     General bed mobility comments: Pt uses R bed rail, performs with safe technique, no cues needed  Transfers Overall transfer level: Needs  assistance Equipment used: Straight cane Transfers: Sit to/from Stand Sit to Stand: Min guard         General transfer comment: Pt performed with proper technique, pushes R hand into bed to come to full standing, then uses cane for amb.   Ambulation/Gait Ambulation/Gait assistance: Min assist Ambulation Distance (Feet): 30 Feet Assistive device: Straight cane Gait Pattern/deviations: Step-to pattern     General Gait Details: Pt amb with short steps, improve balance with support from [facility], decreased unsteadiness on feet, pt reports "legs still feel a little shaky"    Stairs            Wheelchair Mobility    Modified Rankin (Stroke Patients Only)       Balance Overall balance assessment: History of Falls;Needs assistance Sitting-balance support: Feet supported Sitting balance-Leahy Scale: Good Sitting balance - Comments: No assist   Standing balance support: Single extremity supported Standing balance-Leahy Scale: Fair Standing balance comment: uses SPC for support                            Cognition Arousal/Alertness: Awake/alert Behavior During Therapy: WFL for tasks assessed/performed Overall Cognitive Status: Within Functional Limits for tasks assessed                                        Exercises Other Exercises Other Exercises: Therex, 15x, ankle pumps, hip abd/add, LAQs, seated heel raises, bicep curls Other  Exercises: Assited pt with toileting, pt amb to bathroom, able to transfer to/from toilet, perform toileting tasks  Other Exercises: Reviewed proper sling positioning, re-adjusted for comfort     General Comments        Pertinent Vitals/Pain Pain Assessment: Faces Pain Score: 2  Faces Pain Scale: Hurts a little bit Pain Location: Left wrist  Pain Intervention(s): Limited activity within patient's tolerance;Monitored during session;Repositioned    Home Living                      Prior Function             PT Goals (current goals can now be found in the care plan section) Acute Rehab PT Goals Patient Stated Goal: regain independence PT Goal Formulation: With patient Time For Goal Achievement: 01/18/17 Potential to Achieve Goals: Good Progress towards PT goals: Progressing toward goals    Frequency    7X/week      PT Plan Current plan remains appropriate    Co-evaluation              AM-PAC PT "6 Clicks" Daily Activity  Outcome Measure  Difficulty turning over in bed (including adjusting bedclothes, sheets and blankets)?: None Difficulty moving from lying on back to sitting on the side of the bed? : None Difficulty sitting down on and standing up from a chair with arms (e.g., wheelchair, bedside commode, etc,.)?: Unable Help needed moving to and from a bed to chair (including a wheelchair)?: A Little Help needed walking in hospital room?: A Little Help needed climbing 3-5 steps with a railing? : A Lot 6 Click Score: 17    End of Session Equipment Utilized During Treatment: Gait belt Activity Tolerance: Patient tolerated treatment well;Other (comment) (monitored BP and HR in supine, seated, and after amb) Patient left: in chair;with call bell/phone within reach;with chair alarm set;with family/visitor present;with nursing/sitter in room Nurse Communication: Mobility status PT Visit Diagnosis: Other abnormalities of gait and mobility (R26.89);Unsteadiness on feet (R26.81);Muscle weakness (generalized) (M62.81)     Time: 8502-7741 PT Time Calculation (min) (ACUTE ONLY): 45 min  Charges:                       G Codes:  Functional Assessment Tool Used: AM-PAC 6 Clicks Basic Mobility Functional Limitation: Mobility: Walking and moving around Mobility: Walking and Moving Around Current Status (O8786): At least 40 percent but less than 60 percent impaired, limited or restricted Mobility: Walking and Moving Around Goal Status [id number]): At least 20 percent but less  than 40 percent impaired, limited or restricted    Manfred Arch, SPT   Manfred Arch 01/05/2017, 5:09 PM

## 2017-01-05 NOTE — Progress Notes (Signed)
OT Cancellation Note  Patient Details Name: [name] MRN: [id number] DOB: [birth year]   Cancelled Treatment:    Reason Eval/Treat Not Completed: Medical issues which prohibited therapy (Pt. with low blood pressure. Will conitnue to monitor, and intervene when appropriate.)  Harrel Carina, MS, OTR/L 01/05/2017, 2:18 PM

## 2017-01-05 NOTE — Clinical Social Work Note (Addendum)
CSW received phone call from [facility], they can accept patient, but not until Monday.  CSW updated patient's insurance company, patient, family member, case Freight forwarder and physician.  CSW to continue to follow patient's progress throughout discharge planning.    5:15 pm CSW received phone call that patient has been approved for SNF, reference number [id number] which is good through weekend if bed opens up sooner.  CSW informed [facility], to contact weekend social worker if a bed becomes available over weekend and patient is medically ready for discharge and orders have been received.  Jones Broom. [place], MSW, [place]  01/05/2017 2:43 PM

## 2017-01-05 NOTE — Progress Notes (Signed)
He had her arm set yesterday. She states that that feels significantly better.  He is afebrile.  I did change the dressing. The wound is clean and dry.There is no erythema or drainage.  Cultures are growing MRSA.  I will order dressing changes once per day. As long as she is in the hospital she may benefit from intravenous antibiotics.

## 2017-01-06 NOTE — Progress Notes (Signed)
Hold cardizem and lasix this morning per Dr Posey Pronto

## 2017-01-06 NOTE — Progress Notes (Signed)
OT Cancellation Note  Patient Details Name: [name] MRN: [id number] DOB: Aug 02, 1932   Cancelled Treatment:    Reason Eval/Treat Not Completed: Patient declined, no reason specified. Pt recently back to bed after being up in chair following PT session. Pt reporting LUE pain 4/10, requesting pain medications if it won't lower her blood pressure. RN notified. Will re-attempt next date.  Jeni Salles, MPH, MS, OTR/L ascom [phone] 01/06/17, 11:57 AM

## 2017-01-06 NOTE — Progress Notes (Signed)
Patient ID: [name], female   DOB: 04-15-1932, 83 y.o.   MRN: [id number]  [employer] PROGRESS NOTE  [name] HDQ:[id number] DOB: 02-Jul-1932 DOA: 12/31/2016 PCP: Brianna Billet, MD  HPI/Subjective: Pt bp is low, other wise she feeling ok   Objective: Vitals:   01/06/17 0453 01/06/17 0655  BP: (!) 91/35 (!) 92/46  Pulse: 72 69  Resp: 18   Temp: 97.7 F (36.5 C)   SpO2: 90%     Filed Weights   01/02/17 0821 01/03/17 0500 01/04/17 0500  Weight: 165 lb (74.8 kg) 167 lb 3.2 oz (75.8 kg) 169 lb 11.2 oz (77 kg)    ROS: Review of Systems  Constitutional: Negative for chills and fever.  Eyes: Negative for blurred vision.  Respiratory: Negative for cough and shortness of breath.   Cardiovascular: Positive for palpitations. Negative for chest pain.  Gastrointestinal: Negative for abdominal pain, constipation, diarrhea, nausea and vomiting.  Genitourinary: Negative for dysuria.  Musculoskeletal: Positive for joint pain.  Neurological: Negative for dizziness and headaches.   Exam: Physical Exam  Constitutional: She is oriented to person, place, and time.  HENT:  Nose: No mucosal edema.  Mouth/Throat: No oropharyngeal exudate or posterior oropharyngeal edema.  Eyes: Pupils are equal, round, and reactive to light. Conjunctivae, EOM and lids are normal.  Neck: No JVD present. Carotid bruit is not present. No edema present. No thyroid mass and no thyromegaly present.  Cardiovascular: S1 normal and S2 normal.  An irregularly irregular rhythm present. Tachycardia present.  Exam reveals no gallop.   No murmur heard. Pulses:      Dorsalis pedis pulses are 2+ on the right side, and 2+ on the left side.  Respiratory: No respiratory distress. She has decreased breath sounds in the left lower field. She has no wheezes. She has no rhonchi. She has no rales.  GI: Soft. Bowel sounds are normal. There is no tenderness.  Musculoskeletal:       Right ankle: She exhibits no swelling.        Left ankle: She exhibits no swelling.  Left wrist in splint  Lymphadenopathy:    She has no cervical adenopathy.  Neurological: She is alert and oriented to person, place, and time. No cranial nerve deficit.  Able to wiggle fingers left hand  Skin: Skin is warm. Nails show no clubbing.  Erythema under the right breast, buttock skin folds near the groin and lower abdomen.   Psychiatric: She has a normal mood and affect.      Data Reviewed: Basic Metabolic Panel:  Recent Labs Lab 12/31/16 1519 01/01/17 0424 01/03/17 0413 01/05/17 0414  NA 133* 133*  --  134*  K 3.9 3.7  --  5.2*  CL 97* 98*  --  105  CO2 27 25  --  25  GLUCOSE 113* 154*  --  107*  BUN 16 13  --  13  CREATININE 0.70 0.68 0.77 0.86  CALCIUM 8.2* 7.6*  --  8.3*   Liver Function Tests:  Recent Labs Lab 12/31/16 1519  AST 18  ALT 14  ALKPHOS 97  BILITOT 1.2  PROT 6.0*  ALBUMIN 2.5*   CBC:  Recent Labs Lab 12/31/16 1519 01/05/17 0414  WBC 4.8 3.0*  NEUTROABS 4.0  --   HGB 11.0* 9.2*  HCT 32.3* 27.1*  MCV 82.0 82.4  PLT 187 211   Cardiac Enzymes:  Recent Labs Lab 12/31/16 1519  TROPONINI <0.03   BNP (last 3 results)  Recent Labs  11/12/16 1439  BNP 88.0      Studies: No results found.  Scheduled Meds: . albuterol  2.5 mg Nebulization BID  . amiodarone  400 mg Oral BID  . apixaban  5 mg Oral BID  . budesonide (PULMICORT) nebulizer solution  0.5 mg Nebulization BID  . Chlorhexidine Gluconate Cloth  6 each Topical Q0600  . cholecalciferol  1,000 Units Oral Daily  . diltiazem  120 mg Oral Daily  . docusate sodium  100 mg Oral BID  . doxycycline  100 mg Oral Q12H  . fluconazole  100 mg Oral Daily  . lidocaine-prilocaine   Topical Daily  . loratadine  10 mg Oral Daily  . multivitamin with minerals  1 tablet Oral Daily  . mupirocin ointment  1 application Nasal BID  . nystatin   Topical TID  . pantoprazole  40 mg Oral Daily  . protein supplement shake  11 oz Oral BID BM   . senna  1 tablet Oral BID  . temazepam  15 mg Oral QHS    Assessment/Plan:  1. Atrial fibrillation with rapid ventricular response. Patient on Eliquis for anticoagulation.continue high-dose amiodarone 400 mg twice a day.  Hold cardizem due to low bp 2. Weakness. Rehab on monday 3. Distal radial fracture. Reduced in the operating room. Pain control with Vicodin. 4. Chest pain related to Pleurex catheter. Pleurex catheter removed and patient has no further chest pain. Culture sent off by Dr. Genevive Solomon growing MRSA. Continue doxcycline 5. Pneumonitis on chest x-ray. On doxycycline. 6. Fungal infection under skin folds. Started oral Diflucan and continue antifungal pads and nystatin powder under skin folds. 7. 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 Follow-up with Dr. Grayland Solomon needed 8. Hyponatremia.   Code Status:     Code Status Orders        Start     Ordered   01/01/17 0405  Do not attempt resuscitation (DNR)  Continuous    Question Answer Comment  In the event of cardiac or respiratory ARREST Do not call a "code blue"   In the event of cardiac or respiratory ARREST Do not perform Intubation, CPR, defibrillation or ACLS   In the event of cardiac or respiratory ARREST Use medication by any route, position, wound care, and other measures to relive pain and suffering. May use oxygen, suction and manual treatment of airway obstruction as needed for comfort.      01/01/17 0404    Code Status History    Date Active Date Inactive Code Status Order ID Comments User Context   01/01/2017  2:21 AM 01/01/2017  4:04 AM Full Code [id number]  Brianna Harms, MD Inpatient   12/18/2016  4:36 PM 12/19/2016  5:57 PM Full Code [id number]  Brianna Lewandowsky, MD Inpatient   11/12/2016  6:12 PM 11/16/2016  6:18 PM Full Code [id number]  Brianna Flock, MD Inpatient     Disposition Plan: physical therapy recommends rehabilitation. Awaiting prior authorization from insurance company  now.  Consultants:  Cardiothoracic surgery  Oncology  Orthopedic surgery  Antibiotics:  doxycycline  Time spent: 30 minutes, case discussed with granddaughter.  [name]

## 2017-01-06 NOTE — Progress Notes (Signed)
[facility] Encounter Note  Patient: [name] / Admit Date: 12/31/2016 / Date of Encounter: 01/06/2017, 6:42 AM   Subjective: Patient did well overnight. No evidence of chest pain shortness of breath or heart failure type symptoms. Patient converted to normal sinus rhythm with amiodarone higher dosage as well as additional of Cardizem. No evidence of hemodynamic ompromise.  Review of Systems: Positive for:arm pain Negative for: Vision change, hearing change, syncope, dizziness, nausea, vomiting,diarrhea, bloody stool, stomach pain, cough, congestion, diaphoresis, urinary frequency, urinary pain,skin lesions, skin rashes Others previously listed  Objective: Telemetry: normal sinus rhythm Physical Exam: Blood pressure (!) 91/35, pulse 72, temperature 97.7 F (36.5 C), temperature source Oral, resp. rate 18, height 5\' 3"  (1.6 m), weight 77 kg (169 lb 11.2 oz), SpO2 90 %. Body mass index is 30.06 kg/m. General: Well developed, well nourished, in no acute distress. Head: Normocephalic, atraumatic, sclera non-icteric, no xanthomas, nares are without discharge. Neck: No apparent masses Lungs: Normal respirations with no wheezes, no rhonchi, no rales , no crackles   Heart: Regular rate and rhythm, normal S1 S2, no murmur, no rub, no gallop, PMI is normal size and placement, carotid upstroke normal without bruit, jugular venous pressure normal Abdomen: Soft, non-tender, non-distended with normoactive bowel sounds. No hepatosplenomegaly. Abdominal aorta is normal size without bruit Extremities: No edema, no clubbing, no cyanosis, no ulcers,  Peripheral: 2+ radial, 2+ femoral, 2+ dorsal pedal pulses Neuro: Alert and oriented. Moves all extremities spontaneously. Psych:  Responds to questions appropriately with a normal affect.   Intake/Output Summary (Last 24 hours) at 01/06/17 0642 Last data filed at 01/05/17 0951  Gross per 24 hour  Intake           483.33 ml   Output                0 ml  Net           483.33 ml    Inpatient Medications:  . albuterol  2.5 mg Nebulization BID  . amiodarone  400 mg Oral BID  . apixaban  5 mg Oral BID  . budesonide (PULMICORT) nebulizer solution  0.5 mg Nebulization BID  . Chlorhexidine Gluconate Cloth  6 each Topical Q0600  . cholecalciferol  1,000 Units Oral Daily  . diltiazem  120 mg Oral Daily  . docusate sodium  100 mg Oral BID  . doxycycline  100 mg Oral Q12H  . fluconazole  100 mg Oral Daily  . lidocaine-prilocaine   Topical Daily  . loratadine  10 mg Oral Daily  . multivitamin with minerals  1 tablet Oral Daily  . mupirocin ointment  1 application Nasal BID  . nystatin   Topical TID  . pantoprazole  40 mg Oral Daily  . protein supplement shake  11 oz Oral BID BM  . senna  1 tablet Oral BID  . temazepam  15 mg Oral QHS   Infusions:   Labs:  Recent Labs  01/05/17 0414  NA 134*  K 5.2*  CL 105  CO2 25  GLUCOSE 107*  BUN 13  CREATININE 0.86  CALCIUM 8.3*   No results for input(s): AST, ALT, ALKPHOS, BILITOT, PROT, ALBUMIN in the last 72 hours.  Recent Labs  01/05/17 0414  WBC 3.0*  HGB 9.2*  HCT 27.1*  MCV 82.4  PLT 211   No results for input(s): CKTOTAL, CKMB, TROPONINI in the last 72 hours. Invalid input(s): POCBNP No results for input(s): HGBA1C in the last  72 hours.   Weights: Filed Weights   01/02/17 0821 01/03/17 0500 01/04/17 0500  Weight: 74.8 kg (165 lb) 75.8 kg (167 lb 3.2 oz) 77 kg (169 lb 11.2 oz)     Radiology/Studies:  Dg Chest 1 View  Result Date: 12/14/2016 CLINICAL DATA:  Left thoracentesis . EXAM: CHEST 1 VIEW COMPARISON:  12/08/2016. FINDINGS: Interim left left thoracentesis. Removal of significant amount of left pleural fluid with minimal residual effusion. No pneumothorax. Cardiomegaly again noted. Mild bilateral interstitial prominence. Mild component CHF may be present. Pneumonitis cannot be excluded . IMPRESSION: 1. Left thoracentesis with removal  of significant amount left pleural fluid. No evidence of pneumothorax . 2. Cardiomegaly with mild bilateral interstitial prominence. Mild component of CHF view present. Mild pneumonitis cannot be excluded . Electronically Signed   By: Marcello Moores  Register   On: 12/14/2016 14:36   Dg Chest 2 View  Result Date: 12/31/2016 CLINICAL DATA:  83 y/o F; shortness of breath. History of lung cancer. EXAM: CHEST  2 VIEW COMPARISON:  12/26/2016 chest radiographs FINDINGS: Left pleural drain in situ. Left port catheter tip projects over the upper SVC. Increase left-sided pleural effusion. Left hemithorax pleural nodularity also appears increased from prior chest radiograph although this may be projectional due to patient rotation. New opacity in the left mid lung zone may represent a focus of pneumonitis. Mildly enlarged cardiac silhouette. Mild reverse S curvature of the thoracic spine. Right upper quadrant cholecystectomy clips. IMPRESSION: 1. Increased left effusion. Apparent increase in pleural nodularity, possibly due to differences in projection. 2. New opacity in left mid lung zone may represent a focus of pneumonitis. 3. Stable position of left pleural drain and port catheter. Electronically Signed   By: Kristine Garbe M.D.   On: 12/31/2016 17:11   Dg Chest 2 View  Result Date: 12/26/2016 CLINICAL DATA:  Left chest pain.  Pleural effusion. EXAM: CHEST  2 VIEW COMPARISON:  12/18/2016 FINDINGS: Left Port-A-Cath remains in place, unchanged. PleurX drainage catheter again noted at the left base. No pneumothorax. No visible significant effusion. Heart is normal size. There is hyperinflation of the lungs compatible with COPD. Chronic bibasilar densities likely reflect atelectasis or scarring. IMPRESSION: Left PleurX drainage catheter remains in place.  No pneumothorax. Chronic bibasilar atelectasis or scarring. COPD. Electronically Signed   By: Rolm Baptise M.D.   On: 12/26/2016 08:04   Dg Chest 2  View  Result Date: 12/08/2016 CLINICAL DATA:  Shortness of breath. EXAM: CHEST  2 VIEW COMPARISON:  Radiograph of November 12, 2016. FINDINGS: Stable cardiomegaly. Atherosclerosis of thoracic aorta is noted. No pneumothorax is noted. Large left pleural effusion is noted with probable underlying atelectasis or infiltrate. Minimal right basilar subsegmental atelectasis. Bony thorax is unremarkable. IMPRESSION: Aortic atherosclerosis. Large left pleural effusion is stable with probable underlying atelectasis or infiltrate. Electronically Signed   By: Marijo Conception, M.D.   On: 12/08/2016 13:39   Dg Wrist Complete Left  Result Date: 01/02/2017 CLINICAL DATA:  Fall with bruising EXAM: LEFT WRIST - COMPLETE 3+ VIEW COMPARISON:  None. FINDINGS: Diffuse osteopenia. Comminuted, impacted intra-articular distal radius fracture. About 1/2 shaft diameter of dorsal displacement of distal fracture fragment and moderate dorsal angulation. Comminuted, impacted distal radius fracture with mild dorsal angulation. IMPRESSION: 1. Osteopenia 2. Comminuted, impacted intra-articular distal radius fracture with displacement and angulation. 3. Comminuted, slightly angulated distal ulna fracture. Electronically Signed   By: Donavan Foil M.D.   On: 01/02/2017 14:32   Ct Head Wo Contrast  Result  Date: 01/02/2017 CLINICAL DATA:  Recent trip and fall with head injury, no neuro deficit, initial encounter EXAM: CT HEAD WITHOUT CONTRAST TECHNIQUE: Contiguous axial images were obtained from the base of the skull through the vertex without intravenous contrast. COMPARISON:  None. FINDINGS: Brain: Mild atrophic changes are noted. No findings to suggest acute hemorrhage, acute infarction or space-occupying mass lesion are noted. Vascular: No hyperdense vessel or unexpected calcification. Skull: Normal. Negative for fracture or focal lesion. Sinuses/Orbits: No acute finding. Other: None. IMPRESSION: Mild atrophic changes without acute  intracranial abnormality. Electronically Signed   By: Inez Catalina M.D.   On: 01/02/2017 14:18   Dg Chest Port 1 View  Result Date: 12/18/2016 CLINICAL DATA:  Chest tube placement EXAM: PORTABLE CHEST 1 VIEW COMPARISON:  12/14/2016, 12/08/2016, PET-CT 11/29/2016 FINDINGS: Placement of a left-sided central venous port with the tip projecting over the SVC confluence. Interim insertion of left lower chest tube with poorly visible tip, possibly coiled at the left CP angle. Decreased left pleural effusion or thickening. Patchy atelectasis or infiltrate at the left base. Stable cardiomediastinal silhouette. Questionable tiny left apical pneumothorax. IMPRESSION: 1. Insertion of left lower chest drainage tube with poorly visible tip, it is possibly coiled at the left CP angle 2. Decreased left pleural effusion or thickening. Possible tiny left apical pneumothorax. 3. Patchy atelectasis or infiltrate at the left lung base 4. Mild cardiomegaly Electronically Signed   By: Donavan Foil M.D.   On: 12/18/2016 15:34   Dg C-arm 1-60 Min-no Report  Result Date: 12/18/2016 Fluoroscopy was utilized by the requesting physician.  No radiographic interpretation.   US Thoracentesis Asp Pleural Space W/img Guide  Result Date: 12/14/2016 INDICATION: Malignant left pleural effusion. EXAM: ULTRASOUND GUIDED LEFT THORACENTESIS MEDICATIONS: None. COMPLICATIONS: None immediate. PROCEDURE: An ultrasound guided thoracentesis was thoroughly discussed with the patient and questions answered. The benefits, risks, alternatives and complications were also discussed. The patient understands and wishes to proceed with the procedure. Written consent was obtained. Ultrasound was performed to localize and mark an adequate pocket of fluid in the left chest. The area was then prepped and draped in the normal sterile fashion. 1% Lidocaine was used for local anesthesia. Under ultrasound guidance a 6 Fr Safe-T-Centesis catheter was introduced.  Thoracentesis was performed. The catheter was removed and a dressing applied. FINDINGS: A total of approximately 1.3 L of amber colored fluid was removed. IMPRESSION: Successful ultrasound guided left thoracentesis yielding 1.3 L of pleural fluid. Electronically Signed   By: Markus Daft M.D.   On: 12/14/2016 15:37     Assessment and Recommendation  83 y.o. female with essential hypertension mixed hyperlipidemia having a fall and orthopedic fracture of for which the patient had surgical intervention successfully with postoperative atrial fibrillation with rapid ventricular rate now converted to normal sinus rhythm without evidence of heart failure or myocardial infarction 1. Continue amiodarone at 400 mg each day upon discharge to home and would adjust dose down to 200 mg in one to 2 weeks 2. Continue diltiazem at 120 mg for heart rate control and blood pressure control 3. Elequis for further risk reduction in stroke with atrial fibrillatio 4. No further cardiac diagnosticcessary at this time 5. Okay fordischarge home from cardiac standpoint with no rtrictions with rion  Signed, Serafina Royals M.D. FACC

## 2017-01-06 NOTE — Progress Notes (Signed)
Physical Therapy Treatment Patient Details Name: [name] MRN: [id number] DOB: 1932/05/24 Today's Date: 01/06/2017    History of Present Illness [name] is an 83yo white female who comes to [facility] on 10/15 c cough, CP, found to have infection of Pleurx drain. Pt underwent drain removal on 10/16, but sustained a fall in room thereafter, and is now with an acute Lt wrist fracture, now back from OR with LUE casted today. Pt lives alone, formerly fully independent in ADL, IADL, but getting additional help from family as she has been undergtoign CA tratments with chronic pleural effusions.  Pt is now s/p closed reduction of L wrist fracture this date. New order received. Cardio consult for new onset afib w/ RVR, managed with eliquis.     PT Comments    Participated in exercises as described below.  Increased assist for bed mobility on left vs right.  Pt was able to stand and make 2 laps in room with SPC.  Requires min guard for safety.  Fatigues quickly with gait with increased SOB and LE weakness.  O2 sats checked and remained in high 90's.  Remained up in chair at end of session.   Follow Up Recommendations  SNF     Equipment Recommendations  Cane    Recommendations for Other Services       Precautions / Restrictions Precautions Precautions: Fall Restrictions Weight Bearing Restrictions: Yes LUE Weight Bearing: Non weight bearing    Mobility  Bed Mobility Overal bed mobility: Needs Assistance Bed Mobility: Supine to Sit     Supine to sit: Mod assist     General bed mobility comments: to left side of bed, pt stated she typically gets up on right.  Transfers Overall transfer level: Needs assistance Equipment used: Straight cane Transfers: Sit to/from Stand Sit to Stand: Min guard         General transfer comment: Pt performed with proper technique, pushes R hand into bed to come to full standing, then uses cane for amb.   Ambulation/Gait Ambulation/Gait  assistance: Min assist Ambulation Distance (Feet): 50 Feet Assistive device: Straight cane Gait Pattern/deviations: Step-to pattern   Gait velocity interpretation: <1.8 ft/sec, indicative of risk for recurrent falls General Gait Details: short steps.  Initially did well but fatigued quickly as distance incresed with increased SOB and feeling of legs being shaky.   Stairs            Wheelchair Mobility    Modified Rankin (Stroke Patients Only)       Balance Overall balance assessment: History of Falls;Needs assistance Sitting-balance support: Feet supported Sitting balance-Leahy Scale: Good Sitting balance - Comments: No assist   Standing balance support: Single extremity supported Standing balance-Leahy Scale: Fair Standing balance comment: uses SPC for support                            Cognition Arousal/Alertness: Awake/alert Behavior During Therapy: WFL for tasks assessed/performed Overall Cognitive Status: Within Functional Limits for tasks assessed                                        Exercises Other Exercises Other Exercises: Therex, 15x, ankle pumps, hip abd/add, LAQs, seated heel raises, and SAQ    General Comments        Pertinent Vitals/Pain Pain Assessment: 0-10 Pain Score: 2  Faces Pain  Scale: Hurts a little bit Pain Location: Left wrist  Pain Descriptors / Indicators: Aching Pain Intervention(s): Limited activity within patient's tolerance    Home Living                      Prior Function            PT Goals (current goals can now be found in the care plan section) Progress towards PT goals: Progressing toward goals    Frequency    7X/week      PT Plan Current plan remains appropriate    Co-evaluation              AM-PAC PT "6 Clicks" Daily Activity  Outcome Measure  Difficulty turning over in bed (including adjusting bedclothes, sheets and blankets)?: None Difficulty moving  from lying on back to sitting on the side of the bed? : A Little Difficulty sitting down on and standing up from a chair with arms (e.g., wheelchair, bedside commode, etc,.)?: Unable Help needed moving to and from a bed to chair (including a wheelchair)?: A Little Help needed walking in hospital room?: A Little Help needed climbing 3-5 steps with a railing? : A Lot 6 Click Score: 16    End of Session Equipment Utilized During Treatment: Gait belt Activity Tolerance: Patient tolerated treatment well;Other (comment) Patient left: in chair;with call bell/phone within reach;with chair alarm set         Time: 1245-8099 PT Time Calculation (min) (ACUTE ONLY): 18 min  Charges:  $Gait Training: 8-22 mins                    G Codes:       Chesley Noon, PTA 01/06/17, 11:16 AM

## 2017-01-07 LAB — AEROBIC/ANAEROBIC CULTURE W GRAM STAIN (SURGICAL/DEEP WOUND)

## 2017-01-07 LAB — AEROBIC/ANAEROBIC CULTURE (SURGICAL/DEEP WOUND)

## 2017-01-07 NOTE — Progress Notes (Signed)
OT Cancellation Note  Patient Details Name: [name] MRN: [id number] DOB: 07/17/1932   Cancelled Treatment:    Reason Eval/Treat Not Completed: Medical issues which prohibited therapy. Pt continues to have low BP, also critical lab value for K+ noted (5.2). Per therapy protocol, pt contraindicated for therapy due to noted labs. Will re-attempt next date as medically appropriate.  Jeni Salles, MPH, MS, OTR/L ascom [phone] 01/07/17, 10:56 AM

## 2017-01-07 NOTE — Progress Notes (Addendum)
PT Cancellation Note  Patient Details Name: [name] MRN: [id number] DOB: 1932/11/10   Cancelled Treatment:    Reason Eval/Treat Not Completed: Medical issues which prohibited therapy.  Critical lab value for K+ noted (5.2) and pt with afib. Per therapy protocol, pt contraindicated for therapy due to noted labs.  Additionally, BP running slightly lower.   Will re-attempt next date as medically appropriate.   Collie Siad PT, DPT  01/07/2017, 1:58 PM

## 2017-01-07 NOTE — Progress Notes (Signed)
Patient ID: [name], female   DOB: 1932-10-01, 83 y.o.   MRN: [id number]  [facility] PROGRESS NOTE  [name] HYQ:[id number] DOB: [birth year] DOA: 12/31/2016 PCP: Albina Billet, MD  HPI/Subjective: Patient denies any complaints today  Objective: Vitals:   01/07/17 0825 01/07/17 0826  BP: (!) 100/40 (!) 104/42  Pulse: 77   Resp: 16   Temp:    SpO2: 95%     Filed Weights   01/03/17 0500 01/04/17 0500 01/07/17 0300  Weight: 167 lb 3.2 oz (75.8 kg) 169 lb 11.2 oz (77 kg) 171 lb 9 oz (77.8 kg)    ROS: Review of Systems  Constitutional: Negative for chills and fever.  Eyes: Negative for blurred vision.  Respiratory: Negative for cough and shortness of breath.   Cardiovascular: Positive for palpitations. Negative for chest pain.  Gastrointestinal: Negative for abdominal pain, constipation, diarrhea, nausea and vomiting.  Genitourinary: Negative for dysuria.  Musculoskeletal: Positive for joint pain.  Neurological: Negative for dizziness and headaches.   Exam: Physical Exam  Constitutional: She is oriented to person, place, and time.  HENT:  Nose: No mucosal edema.  Mouth/Throat: No oropharyngeal exudate or posterior oropharyngeal edema.  Eyes: Pupils are equal, round, and reactive to light. Conjunctivae, EOM and lids are normal.  Neck: No JVD present. Carotid bruit is not present. No edema present. No thyroid mass and no thyromegaly present.  Cardiovascular: S1 normal and S2 normal.  An irregularly irregular rhythm present. Tachycardia present.  Exam reveals no gallop.   No murmur heard. Pulses:      Dorsalis pedis pulses are 2+ on the right side, and 2+ on the left side.  Respiratory: No respiratory distress. She has decreased breath sounds in the left lower field. She has no wheezes. She has no rhonchi. She has no rales.  GI: Soft. Bowel sounds are normal. There is no tenderness.  Musculoskeletal:       Right ankle: She exhibits no swelling.       Left ankle:  She exhibits no swelling.  Left wrist in splint  Lymphadenopathy:    She has no cervical adenopathy.  Neurological: She is alert and oriented to person, place, and time. No cranial nerve deficit.  Able to wiggle fingers left hand  Skin: Skin is warm. Nails show no clubbing.  Erythema under the right breast, buttock skin folds near the groin and lower abdomen.   Psychiatric: She has a normal mood and affect.      Data Reviewed: Basic Metabolic Panel:  Recent Labs Lab 12/31/16 1519 01/01/17 0424 01/03/17 0413 01/05/17 0414  NA 133* 133*  --  134*  K 3.9 3.7  --  5.2*  CL 97* 98*  --  105  CO2 27 25  --  25  GLUCOSE 113* 154*  --  107*  BUN 16 13  --  13  CREATININE 0.70 0.68 0.77 0.86  CALCIUM 8.2* 7.6*  --  8.3*   Liver Function Tests:  Recent Labs Lab 12/31/16 1519  AST 18  ALT 14  ALKPHOS 97  BILITOT 1.2  PROT 6.0*  ALBUMIN 2.5*   CBC:  Recent Labs Lab 12/31/16 1519 01/05/17 0414  WBC 4.8 3.0*  NEUTROABS 4.0  --   HGB 11.0* 9.2*  HCT 32.3* 27.1*  MCV 82.0 82.4  PLT 187 211   Cardiac Enzymes:  Recent Labs Lab 12/31/16 1519  TROPONINI <0.03   BNP (last 3 results)  Recent Labs  11/12/16 1439  BNP 88.0      Studies: No results found.  Scheduled Meds: . albuterol  2.5 mg Nebulization BID  . amiodarone  400 mg Oral BID  . apixaban  5 mg Oral BID  . budesonide (PULMICORT) nebulizer solution  0.5 mg Nebulization BID  . Chlorhexidine Gluconate Cloth  6 each Topical Q0600  . cholecalciferol  1,000 Units Oral Daily  . diltiazem  120 mg Oral Daily  . docusate sodium  100 mg Oral BID  . doxycycline  100 mg Oral Q12H  . fluconazole  100 mg Oral Daily  . lidocaine-prilocaine   Topical Daily  . loratadine  10 mg Oral Daily  . multivitamin with minerals  1 tablet Oral Daily  . mupirocin ointment  1 application Nasal BID  . nystatin   Topical TID  . pantoprazole  40 mg Oral Daily  . protein supplement shake  11 oz Oral BID BM  . senna  1  tablet Oral BID  . temazepam  15 mg Oral QHS    Assessment/Plan:  1. Atrial fibrillation with rapid ventricular response. Patient on Eliquis for anticoagulation.continue high-dose amiodarone 400 mg twice a day. Continue Cardizem 2. Weakness. Rehab on monday 3. Distal radial fracture. Reduced in the operating room. Pain control with Vicodin. 4. Chest pain related to Pleurex catheter. Pleurex catheter removed and patient has no further chest pain. Culture sent off by Dr. Genevive Bi growing MRSA. Continue doxcycline 5. Pneumonitis on chest x-ray. On doxycycline. 6. Fungal infection under skin folds.  continue antifungal pads and nystatin powder under skin folds. 7. 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 Follow-up with Dr. Grayland Ormond needed 8. Hyponatremia.   Code Status:     Code Status Orders        Start     Ordered   01/01/17 0405  Do not attempt resuscitation (DNR)  Continuous    Question Answer Comment  In the event of cardiac or respiratory ARREST Do not call a "code blue"   In the event of cardiac or respiratory ARREST Do not perform Intubation, CPR, defibrillation or ACLS   In the event of cardiac or respiratory ARREST Use medication by any route, position, wound care, and other measures to relive pain and suffering. May use oxygen, suction and manual treatment of airway obstruction as needed for comfort.      01/01/17 0404    Code Status History    Date Active Date Inactive Code Status Order ID Comments User Context   01/01/2017  2:21 AM 01/01/2017  4:04 AM Full Code [id number]  Gorden Harms, MD Inpatient   12/18/2016  4:36 PM 12/19/2016  5:57 PM Full Code [id number]  [name], MD Inpatient   11/12/2016  6:12 PM 11/16/2016  6:18 PM Full Code [id number]  Dustin Flock, MD Inpatient     Disposition Plan: physical therapy recommends rehabilitation. Awaiting prior authorization from insurance company now.  Consultants:  Cardiothoracic  surgery  Oncology  Orthopedic surgery  Antibiotics:  doxycycline  Time spent: 107min  Jerrion Tabbert, [facility]

## 2017-01-08 MED ORDER — HEPARIN SOD (PORK) LOCK FLUSH 100 UNIT/ML IV SOLN
500.0000 [IU] | Freq: Once | INTRAVENOUS | Status: AC
  Administered 2017-01-08: 16:00:00 500 [IU] via INTRAVENOUS
  Filled 2017-01-08: qty 5

## 2017-01-08 MED ORDER — ALBUTEROL SULFATE (2.5 MG/3ML) 0.083% IN NEBU: 2.5000 mg | INHALATION_SOLUTION | Freq: Two times a day (BID) | RESPIRATORY_TRACT | 12 refills | Status: DC

## 2017-01-08 MED ORDER — DILTIAZEM HCL ER COATED BEADS 120 MG PO CP24: 120.0000 mg | ORAL_CAPSULE | Freq: Every day | ORAL | Status: AC

## 2017-01-08 MED ORDER — DOXYCYCLINE MONOHYDRATE 100 MG PO CAPS
100.0000 mg | ORAL_CAPSULE | Freq: Two times a day (BID) | ORAL | 1 refills | Status: DC
Start: 2017-01-08 — End: 2017-01-15

## 2017-01-08 MED ORDER — AMIODARONE HCL 400 MG PO TABS: 400.0000 mg | ORAL_TABLET | Freq: Two times a day (BID) | ORAL | Status: DC

## 2017-01-08 MED ORDER — HYDROCODONE-ACETAMINOPHEN 5-325 MG PO TABS: 1.0000 | ORAL_TABLET | ORAL | 0 refills | Status: DC | PRN

## 2017-01-08 NOTE — Progress Notes (Signed)
OT Cancellation Note  Patient Details Name: [name] MRN: [id number] DOB: 1933/02/05   Cancelled Treatment:    Reason Eval/Treat Not Completed: Other (comment). Spoke with CM. Pt preparing for discharge soon to [facility], unavailable for OT treatment.  Jeni Salles, MPH, MS, OTR/L ascom [phone] 01/08/17, 3:59 PM

## 2017-01-08 NOTE — Care Management Important Message (Signed)
Important Message  Patient Details  Name: [name] MRN: [id number] Date of Birth: 1932-08-05   Medicare Important Message Given:  Yes    Shelbie Ammons, RN 01/08/2017, 8:13 AM

## 2017-01-08 NOTE — Clinical Social Work Note (Addendum)
Updated PT notes faxed to patient's insurance company.  Plan is for patient to discharge to [facility] for rehab.  CSW to continue to follow patient's progress throughout discharge planning.  3:15pm  CSW received phone call from patient's insurance company that patient has been approved, authorization number is [id number] CSW updated [facility].  Patient to be d/c'ed today to [facility].  Patient and family agreeable to plans will transport via ems RN to call report to Theressa Stamps, room 612, [phone].  Jones Broom. [place], MSW, [place]  01/08/2017 9:40 AM

## 2017-01-08 NOTE — Discharge Instructions (Signed)
Pack wound daily and cover with dry dressing  [facility] [place]:  Cardiac diet  DISCHARGE CONDITION:  Stable  ACTIVITY:  Activity as tolerated  OXYGEN:  Home Oxygen: No.   Oxygen Delivery: room air  DISCHARGE LOCATION:  nursing home    ADDITIONAL DISCHARGE INSTRUCTION: non weight bearing right wrist     If you experience worsening of your admission symptoms, develop shortness of breath, life threatening emergency, suicidal or homicidal thoughts you must seek medical attention immediately by calling 911 or calling your MD immediately  if symptoms less severe.  You Must read complete instructions/literature along with all the possible adverse reactions/side effects for all the Medicines you take and that have been prescribed to you. Take any new Medicines after you have completely understood and accpet all the possible adverse reactions/side effects.   Please note  You were cared for by a hospitalist during your hospital stay. If you have any questions about your discharge medications or the care you received while you were in the hospital after you are discharged, you can call the unit and asked to speak with the hospitalist on call if the hospitalist that took care of you is not available. Once you are discharged, your primary care physician will handle any further medical issues. Please note that NO REFILLS for any discharge medications will be authorized once you are discharged, as it is imperative that you return to your primary care physician (or establish a relationship with a primary care physician if you do not have one) for your aftercare needs so that they can reassess your need for medications and monitor your lab values.

## 2017-01-08 NOTE — Progress Notes (Signed)
Pt has verbalized this shift that she is feeling overwhelmed and depressed with the past 8 weeks course of events. Pt states that she used to be prescribed Prozac with good results but stopped taking. Pt states that when she had a relapse, her MD prescribed Lexapro which also worked. Pt given PRN  klonopin with good results. Pt is requesting antidepressant. Day RN advised.

## 2017-01-08 NOTE — Discharge Summary (Signed)
[name] at [facility], 83 y.o., DOB January 18, 1933, MRN [id number]. 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 female withBelow past medical history, most notable for recently diagnosed stage IV lung cancer with malignant pleural effusion status post recent Pleurx catheter placement/Mediport on 12/18/2016 followed by Dr. Alverda Skeans, status post chemotherapy 3 days ago on Thursday, presenting with multiple complaints chief among which includes chest pain around insertion site of Pleurx catheter for the last's several days, worse with movement-was thought to have some infection per ED attending, also complains of rash underneath breasts and groin area, also complains of chest pain with deep breathing with associated cough, any emergency room patient noted to have chest x-ray for increased left effusion, increased nodularity. Patient was admitted to the hospital for further evaluation and therapy. She was seen in consultation by Dr.Oaks. Due to her catheter not draining he recommended removing this. It was removed showed MRSA patient has been treated by doxycycline. She was in the process of being discharged and she fell patient was noted to have distal radial fracture which was reduced in the operating room. Patient also was seen  by cardiology for atrial fibrillation and her medications were adjusted. She is very deconditioned in need of further rehabilitation.             Consults  cardiology,CT surgery, oncology  Significant Tests:  See full reports for all details     Dg Chest 1 View  Result Date: 12/14/2016 CLINICAL DATA:  Left thoracentesis . EXAM: CHEST 1 VIEW COMPARISON:  12/08/2016. FINDINGS: Interim left left thoracentesis. Removal of significant amount of left pleural fluid with minimal residual effusion. No pneumothorax. Cardiomegaly again noted. Mild bilateral interstitial prominence. Mild component CHF may be present. Pneumonitis cannot be excluded . IMPRESSION: 1. Left thoracentesis with removal of significant amount left pleural fluid. No evidence of pneumothorax . 2. Cardiomegaly with mild bilateral interstitial prominence. Mild component of CHF view present. Mild pneumonitis cannot be excluded . Electronically Signed   By: Marcello Moores  Register   On: 12/14/2016 14:36   Dg Chest 2 View  Result Date: 12/31/2016 CLINICAL DATA:  83 y/o F; shortness of breath. History of lung cancer. EXAM: CHEST  2 VIEW COMPARISON:  12/26/2016 chest radiographs FINDINGS: Left pleural drain in situ. Left port catheter tip projects over the upper SVC. Increase left-sided pleural effusion. Left hemithorax pleural nodularity also appears increased from prior chest radiograph although this may be projectional due to patient rotation. New opacity in the left mid lung zone may represent a focus of pneumonitis. Mildly enlarged cardiac silhouette. Mild reverse S curvature of the thoracic spine. Right upper quadrant cholecystectomy clips. IMPRESSION: 1. Increased left effusion. Apparent increase in pleural nodularity, possibly due to differences in projection. 2. New opacity in left mid lung zone may represent a focus of pneumonitis.  3. Stable position of left pleural drain and port catheter. Electronically Signed   By: Kristine Garbe M.D.   On: 12/31/2016 17:11   Dg Chest 2 View  Result Date: 12/26/2016 CLINICAL DATA:  Left chest pain.  Pleural effusion. EXAM: CHEST  2 VIEW COMPARISON:  12/18/2016 FINDINGS: Left Port-A-Cath remains in place, unchanged. PleurX drainage catheter again noted at the left base. No pneumothorax. No visible significant effusion. Heart is normal size. There is hyperinflation of the lungs compatible with COPD. Chronic bibasilar densities likely reflect atelectasis or scarring. IMPRESSION: Left PleurX drainage catheter remains in place.  No pneumothorax. Chronic bibasilar atelectasis or scarring. COPD. Electronically Signed   By: Rolm Baptise M.D.   On: 12/26/2016 08:04   Dg Wrist Complete Left  Result Date: 01/02/2017 CLINICAL DATA:  Fall with bruising EXAM: LEFT WRIST - COMPLETE 3+ VIEW COMPARISON:  None. FINDINGS: Diffuse osteopenia. Comminuted, impacted intra-articular distal radius fracture. About 1/2 shaft diameter of dorsal displacement of distal fracture fragment and moderate dorsal angulation. Comminuted, impacted distal radius fracture with mild dorsal angulation. IMPRESSION: 1. Osteopenia 2. Comminuted, impacted intra-articular distal radius fracture with displacement and angulation. 3. Comminuted, slightly angulated distal ulna fracture. Electronically Signed   By: Donavan Foil M.D.   On: 01/02/2017 14:32   Ct Head Wo Contrast  Result Date: 01/02/2017 CLINICAL DATA:  Recent trip and fall with head injury, no neuro deficit, initial encounter EXAM: CT HEAD WITHOUT CONTRAST TECHNIQUE: Contiguous axial images were obtained from the base of the skull through the vertex without intravenous contrast. COMPARISON:  None. FINDINGS: Brain: Mild atrophic changes are noted. No findings to suggest acute hemorrhage, acute infarction or space-occupying mass lesion are noted. Vascular: No hyperdense vessel or unexpected calcification. Skull: Normal. Negative for fracture or focal lesion.  Sinuses/Orbits: No acute finding. Other: None. IMPRESSION: Mild atrophic changes without acute intracranial abnormality. Electronically Signed   By: Inez Catalina M.D.   On: 01/02/2017 14:18   Dg Chest Port 1 View  Result Date: 12/18/2016 CLINICAL DATA:  Chest tube placement EXAM: PORTABLE CHEST 1 VIEW COMPARISON:  12/14/2016, 12/08/2016, PET-CT 11/29/2016 FINDINGS: Placement of a left-sided central venous port with the tip projecting over the SVC confluence. Interim insertion of left lower chest tube with poorly visible tip, possibly coiled at the left CP angle. Decreased left pleural effusion or thickening. Patchy atelectasis or infiltrate at the left base. Stable cardiomediastinal silhouette. Questionable tiny left apical pneumothorax. IMPRESSION: 1. Insertion of left lower chest drainage tube with poorly visible tip, it is possibly coiled at the left CP angle 2. Decreased left pleural effusion or thickening. Possible tiny left apical pneumothorax. 3. Patchy atelectasis or infiltrate at the left lung base 4. Mild cardiomegaly Electronically Signed   By: Donavan Foil M.D.   On: 12/18/2016 15:34   Dg C-arm 1-60 Min-no Report  Result Date: 12/18/2016 Fluoroscopy was utilized by the requesting physician.  No radiographic interpretation.   US Thoracentesis Asp Pleural Space W/img Guide  Result Date: 12/14/2016 INDICATION: Malignant left pleural effusion. EXAM: ULTRASOUND GUIDED LEFT THORACENTESIS MEDICATIONS: None. COMPLICATIONS: None immediate. PROCEDURE: An ultrasound guided thoracentesis was thoroughly discussed with the patient and questions answered. The benefits, risks, alternatives and complications were also discussed. The patient understands and wishes to proceed with the procedure. Written consent was obtained. Ultrasound was performed to localize and mark an adequate pocket of fluid in the left chest. The area was then prepped and draped in the normal sterile fashion. 1% Lidocaine was used  for  local anesthesia. Under ultrasound guidance a 6 Fr Safe-T-Centesis catheter was introduced. Thoracentesis was performed. The catheter was removed and a dressing applied. FINDINGS: A total of approximately 1.3 L of amber colored fluid was removed. IMPRESSION: Successful ultrasound guided left thoracentesis yielding 1.3 L of pleural fluid. Electronically Signed   By: Markus Daft M.D.   On: 12/14/2016 15:37       Today   Subjective:   Thayer Headings Rynders patient feels well currently has no complaints Blood pressure (!) 102/45, pulse 67, temperature 97.6 F (36.4 C), temperature source Oral, resp. rate 17, height 5\' 3"  (1.6 m), weight 171 lb 9 oz (77.8 kg), SpO2 96 %.  . No intake or output data in the 24 hours ending 01/08/17 0933  Exam VITAL SIGNS: Blood pressure (!) 102/45, pulse 67, temperature 97.6 F (36.4 C), temperature source Oral, resp. rate 17, height 5\' 3"  (1.6 m), weight 171 lb 9 oz (77.8 kg), SpO2 96 %.  GENERAL:  83 y.o.-year-old patient lying in the bed with no acute distress.  EYES: Pupils equal, round, reactive to light and accommodation. No scleral icterus. Extraocular muscles intact.  HEENT: Head atraumatic, normocephalic. Oropharynx and nasopharynx clear.  NECK:  Supple, no jugular venous distention. No thyroid enlargement, no tenderness.  LUNGS: Normal breath sounds bilaterally, no wheezing, rales,rhonchi or crepitation. No use of accessory muscles of respiration.  CARDIOVASCULAR: S1, S2 normal. No murmurs, rubs, or gallops.  ABDOMEN: Soft, nontender, nondistended. Bowel sounds present. No organomegaly or mass.  EXTREMITIES: No pedal edema, cyanosis, or clubbing.  NEUROLOGIC: Cranial nerves II through XII are intact. Muscle strength 5/5 in all extremities. Sensation intact. Gait not checked.  PSYCHIATRIC: The patient is alert and oriented x 3.  SKIN: No obvious rash, lesion, or ulcer.   Data Review     CBC w Diff:  Lab Results  Component Value Date   WBC 3.0 (L)  01/05/2017   HGB 9.2 (L) 01/05/2017   HCT 27.1 (L) 01/05/2017   PLT 211 01/05/2017   LYMPHOPCT 13 12/31/2016   MONOPCT 1 12/31/2016   EOSPCT 3 12/31/2016   BASOPCT 1 12/31/2016   CMP:  Lab Results  Component Value Date   NA 134 (L) 01/05/2017   K 5.2 (H) 01/05/2017   CL 105 01/05/2017   CO2 25 01/05/2017   BUN 13 01/05/2017   CREATININE 0.86 01/05/2017   PROT 6.0 (L) 12/31/2016   ALBUMIN 2.5 (L) 12/31/2016   BILITOT 1.2 12/31/2016   ALKPHOS 97 12/31/2016   AST 18 12/31/2016   ALT 14 12/31/2016  .  Micro Results Recent Results (from the past 240 hour(s))  Aerobic/Anaerobic Culture (surgical/deep wound)     Status: None   Collection Time: 01/02/17  9:30 AM  Result Value Ref Range Status   Specimen Description LUNG LEFT PLEURX CULTURE  Final   Special Requests NONE  Final   Gram Stain   Final    FEW WBC PRESENT, PREDOMINANTLY PMN RARE GRAM POSITIVE COCCI IN PAIRS    Culture   Final    MODERATE METHICILLIN RESISTANT STAPHYLOCOCCUS AUREUS NO ANAEROBES ISOLATED Performed at [facility], [address], [place], [place] [address]    Report Status 01/07/2017 FINAL  Final   Organism ID, Bacteria METHICILLIN RESISTANT STAPHYLOCOCCUS AUREUS  Final      Susceptibility   Methicillin resistant staphylococcus aureus - MIC*    CIPROFLOXACIN >=8 RESISTANT Resistant     ERYTHROMYCIN RESISTANT Resistant     GENTAMICIN <=  0.5 SENSITIVE Sensitive     OXACILLIN >=4 RESISTANT Resistant     TETRACYCLINE <=1 SENSITIVE Sensitive     VANCOMYCIN 1 SENSITIVE Sensitive     TRIMETH/SULFA <=10 SENSITIVE Sensitive     CLINDAMYCIN RESISTANT Resistant     RIFAMPIN <=0.5 SENSITIVE Sensitive     Inducible Clindamycin POSITIVE Resistant     * MODERATE METHICILLIN RESISTANT STAPHYLOCOCCUS AUREUS  MRSA PCR Screening     Status: Abnormal   Collection Time: 01/04/17  3:44 AM  Result Value Ref Range Status   MRSA by PCR POSITIVE (A) NEGATIVE Final    Comment:        The GeneXpert MRSA Assay  (FDA approved for NASAL specimens only), is one component of a comprehensive MRSA colonization surveillance program. It is not intended to diagnose MRSA infection nor to guide or monitor treatment for MRSA infections. RESULT CALLED TO, READ BACK BY AND VERIFIED WITH:  Dorna Bloom AT 7782 01/04/17 [place]         Code Status Orders        Start     Ordered   01/01/17 0405  Do not attempt resuscitation (DNR)  Continuous    Question Answer Comment  In the event of cardiac or respiratory ARREST Do not call a "code blue"   In the event of cardiac or respiratory ARREST Do not perform Intubation, CPR, defibrillation or ACLS   In the event of cardiac or respiratory ARREST Use medication by any route, position, wound care, and other measures to relive pain and suffering. May use oxygen, suction and manual treatment of airway obstruction as needed for comfort.      01/01/17 0404    Code Status History    Date Active Date Inactive Code Status Order ID Comments User Context   01/01/2017  2:21 AM 01/01/2017  4:04 AM Full Code [id number]  Gorden Harms, MD Inpatient   12/18/2016  4:36 PM 12/19/2016  5:57 PM Full Code [id number]  [name], MD Inpatient   11/12/2016  6:12 PM 11/16/2016  6:18 PM Full Code [id number]  Dustin Flock, MD Inpatient           Contact information for follow-up providers    Albina Billet, MD. Go on 01/16/2017.   Specialty:  Internal Medicine Why:  @10 :15 AM Contact information: [address]   [place] [address] [phone]        [name], MD. Go on 01/04/2017.   Specialty:  Oncology Why:  @9 :15 AM Contact information: [place] [address] [phone]        [name], MD. Go on 01/09/2017.   Specialty:  Cardiothoracic Surgery Why:  @9 :30 AM Contact information: [place] [address] [phone]        Thornton Park, MD Follow up in 10 day(s).    Specialty:  Orthopedic Surgery Why:  [place], just go over, no appointment [facility] information: [place] [address] [phone]            Contact information for after-discharge care    Destination    [facility] Follow up.   Specialty:  [place] information: [address] [place] [phone]                  Discharge Medications   Allergies as of 01/08/2017      Reactions   Sulfa  Antibiotics Other (See Comments)   Seizures   Levaquin [levofloxacin] Hives, Itching      Medication List    STOP taking these medications   nystatin-triamcinolone ointment Commonly known as:  MYCOLOG     TAKE these medications   albuterol (2.5 MG/3ML) 0.083% nebulizer solution Commonly known as:  PROVENTIL Take 3 mLs (2.5 mg total) by nebulization 2 (two) times daily.   amiodarone 400 MG tablet Commonly known as:  PACERONE Take 1 tablet (400 mg total) by mouth 2 (two) times daily. What changed:  medication strength  how much to take   apixaban 5 MG Tabs tablet Commonly known as:  ELIQUIS Take 1 tablet (5 mg total) by mouth 2 (two) times daily.   cetirizine 10 MG tablet Commonly known as:  ZYRTEC Take 10 mg by mouth daily as needed for allergies.   cholecalciferol 1000 units tablet Commonly known as:  VITAMIN D Take 1,000 Units by mouth daily.   diltiazem 120 MG 24 hr capsule Commonly known as:  CARDIZEM CD Take 1 capsule (120 mg total) by mouth daily. What changed:  medication strength  how much to take   doxycycline 100 MG capsule Commonly known as:  MONODOX Take 1 capsule (100 mg total) by mouth 2 (two) times daily.   furosemide 20 MG tablet Commonly known as:  LASIX Take 1 tablet by mouth daily as needed. For EDEMA   HYDROcodone-acetaminophen 5-325 MG tablet Commonly known as:  NORCO Take 1 tablet by mouth every 4 (four) hours as needed for  moderate pain.   hydrOXYzine 25 MG tablet Commonly known as:  ATARAX/VISTARIL Take 1 tablet (25 mg total) by mouth every 6 (six) hours as needed. For itching.   KLOR-CON M20 20 MEQ tablet Generic drug:  potassium chloride SA Take 40 mEq by mouth 2 (two) times daily.   lidocaine-prilocaine cream Commonly known as:  EMLA Apply to affected area once   nystatin powder Commonly known as:  MYCOSTATIN/NYSTOP Apply three times a day to red skin folds   omeprazole 20 MG capsule Commonly known as:  PRILOSEC Take 20 mg by mouth daily.   ondansetron 8 MG tablet Commonly known as:  ZOFRAN Take 1 tablet (8 mg total) by mouth 2 (two) times daily as needed for refractory nausea / vomiting.   prochlorperazine 10 MG tablet Commonly known as:  COMPAZINE Take 1 tablet (10 mg total) by mouth every 6 (six) hours as needed (Nausea or vomiting).   protein supplement shake Liqd Commonly known as:  PREMIER PROTEIN Take 325 mLs (11 oz total) by mouth 2 (two) times daily between meals.          Total Time in preparing paper work, data evaluation and todays exam - 35 minutes  Dustin Flock M.D on 01/08/2017 at 9:33 AM  [facility]   Office  [phone]

## 2017-01-08 NOTE — Progress Notes (Signed)
Received MD order to discharge patient to [facility] called report to Reggie Pile RN  , port de-accessed, patient went to [facility] via [facility]

## 2017-01-08 NOTE — Progress Notes (Signed)
Called MD per patient she does not have a good appetite.  No new orders

## 2017-01-09 ENCOUNTER — Telehealth: Payer: Self-pay | Admitting: *Deleted

## 2017-01-09 ENCOUNTER — Ambulatory Visit: Payer: Medicare HMO | Admitting: Cardiothoracic Surgery

## 2017-01-09 NOTE — Telephone Encounter (Signed)
Do you want any labs on that day?

## 2017-01-09 NOTE — Telephone Encounter (Signed)
can she come back on 10/25 for evaluation? Would not restart treatment that day but will use appointment for hospital follow-up and treatment planning.

## 2017-01-09 NOTE — Telephone Encounter (Signed)
Pt scheduled at 8:45am on Thursday 10/25. Pt's daughter Shirlean Mylar has been notified with appt.

## 2017-01-09 NOTE — Telephone Encounter (Signed)
Pt's daughter called in to ask when pt will need to follow up with Dr. Grayland Ormond to receive chemotherapy. Pt recently in the hospital and had a fall that resulted in a broken arm. Pt has been discharged and is currently at [facility]. Family can transport pt to [facility] if needed but doesn't know when pt needs to follow up again.  Pt received day 8, cycle 1 on 10/11.   Please advise.

## 2017-01-09 NOTE — Telephone Encounter (Signed)
No, just MD will be fine.

## 2017-01-10 NOTE — Progress Notes (Signed)
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  INTERVAL HISTORY: Patient returns to clinic today for hospital follow-up and discussion on whether to reinitiate treatment. She suffered a broken wrist while in the hospital just over a week ago. She has increased weakness and fatigue, but this is improving. Her Pleurx catheter has been removed. Her rash has resolved. She has no neurologic complaints. She does not complain of rib pain today. She has a fair appetite, but denies weight loss. She has no chest pain, shortness of breath, cough, or hemoptysis. She denies any nausea, vomiting, constipation, or diarrhea. She has no urinary complaints. Patient offers no further specific complaints today.  REVIEW OF SYSTEMS:   Review of Systems  Constitutional: Positive for malaise/fatigue. Negative for fever and weight loss.  Respiratory: Negative.  Negative for cough and shortness of breath.   Cardiovascular: Negative.  Negative for chest pain and leg swelling.  Gastrointestinal: Negative.  Negative for abdominal pain.  Genitourinary: Negative.   Musculoskeletal: Positive for back pain, falls and joint pain.  Skin: Negative.  Negative for itching and rash.  Neurological: Positive for weakness. Negative for sensory change.  Psychiatric/Behavioral: Negative.  The patient is not nervous/anxious.     As per HPI. Otherwise, a complete review of systems is negative.  PAST MEDICAL HISTORY: Past Medical History:  Diagnosis Date  . COPD (chronic obstructive pulmonary disease) ([place])   . GU (gastric peptic ulcer)   . Lung cancer ([place])   . Malignant pleural effusion   . PAF (paroxysmal atrial fibrillation) ([place]) 10/2016   a.  10/2016 post thoracentesis ; b. 10/2016 Echo: EF 65-70%, mild LVH;  c. Amio/Eliquis initiated (CHA2DSVASc = 3).  . Pneumonia   . Wrist fracture 1993   bilateral    PAST SURGICAL HISTORY: Past Surgical History:  Procedure Laterality Date  . ABDOMINAL HYSTERECTOMY    . CATARACT EXTRACTION W/ INTRAOCULAR LENS  IMPLANT, BILATERAL    . CHEST TUBE INSERTION Left 12/18/2016   Procedure: CHEST TUBE INSERTION;  Surgeon: [name], MD;  Location: [facility] ORS;  Service: General;  Laterality: Left;  . CHOLECYSTECTOMY    . CLOSED REDUCTION WRIST FRACTURE Left 01/04/2017   Procedure: CLOSED REDUCTION WRIST;  Surgeon: Thornton Park, MD;  Location: [facility] ORS;  Service: Orthopedics;  Laterality: Left;  . PORTACATH PLACEMENT Left 12/18/2016   Procedure: INSERTION PORT-A-CATH;  Surgeon: [name], MD;  Location: [facility] ORS;  Service: General;  Laterality: Left;  . REMOVAL OF PLEURAL DRAINAGE CATHETER N/A 01/02/2017   Procedure: REMOVAL OF PLEURAL DRAINAGE CATHETER;  Surgeon: [name], MD;  Location: [facility] ORS;  Service: Thoracic;  Laterality: N/A;  . TONSILLECTOMY      FAMILY HISTORY: Family History  Problem Relation Age of Onset  . Hypertension Father   . Heart disease Father   . Heart attack Father   . Congestive Heart Failure Mother   . Stroke Mother   . Atrial fibrillation Sister     ADVANCED DIRECTIVES (Y/N):  N  HEALTH MAINTENANCE: Social History  Substance Use Topics  . Smoking status: Former Smoker    Packs/day: 1.00    Years: 68.00    Quit date: 11/06/2016  . Smokeless tobacco: Never Used  . Alcohol use No  Colonoscopy:  PAP:  Bone density:  Lipid panel:  Allergies  Allergen Reactions  . Sulfa Antibiotics Other (See Comments)    Seizures   . Levaquin [Levofloxacin] Hives and Itching    Current Outpatient Prescriptions  Medication Sig Dispense Refill  . albuterol (PROVENTIL) (2.5 MG/3ML) 0.083% nebulizer solution Take 3 mLs (2.5 mg total) by nebulization 2  (two) times daily. 75 mL 12  . amiodarone (PACERONE) 400 MG tablet Take 1 tablet (400 mg total) by mouth 2 (two) times daily.    Marland Kitchen apixaban (ELIQUIS) 5 MG TABS tablet Take 1 tablet (5 mg total) by mouth 2 (two) times daily. 60 tablet 0  . cetirizine (ZYRTEC) 10 MG tablet Take 10 mg by mouth daily as needed for allergies.    . cholecalciferol (VITAMIN D) 1000 units tablet Take 1,000 Units by mouth daily.    Marland Kitchen diltiazem (CARDIZEM CD) 120 MG 24 hr capsule Take 1 capsule (120 mg total) by mouth daily.    Marland Kitchen doxycycline (MONODOX) 100 MG capsule Take 1 capsule (100 mg total) by mouth 2 (two) times daily. 60 capsule 1  . furosemide (LASIX) 20 MG tablet Take 1 tablet by mouth daily as needed. For EDEMA  3  . HYDROcodone-acetaminophen (NORCO) 5-325 MG tablet Take 1 tablet by mouth every 4 (four) hours as needed for moderate pain. 20 tablet 0  . hydrOXYzine (ATARAX/VISTARIL) 25 MG tablet Take 1 tablet (25 mg total) by mouth every 6 (six) hours as needed. For itching. 120 tablet 0  . KLOR-CON M20 20 MEQ tablet Take 40 mEq by mouth 2 (two) times daily.  0  . lidocaine-prilocaine (EMLA) cream Apply to affected area once 30 g 3  . nystatin (MYCOSTATIN/NYSTOP) powder Apply three times a day to red skin folds 60 g 0  . omeprazole (PRILOSEC) 20 MG capsule Take 20 mg by mouth daily.    . ondansetron (ZOFRAN) 8 MG tablet Take 1 tablet (8 mg total) by mouth 2 (two) times daily as needed for refractory nausea / vomiting. 60 tablet 2  . prochlorperazine (COMPAZINE) 10 MG tablet Take 1 tablet (10 mg total) by mouth every 6 (six) hours as needed (Nausea or vomiting). 60 tablet 2  . protein supplement shake (PREMIER PROTEIN) LIQD Take 325 mLs (11 oz total) by mouth 2 (two) times daily between meals. 60 Can 0   No current facility-administered medications for this visit.     OBJECTIVE: Vitals:   01/11/17 0919  BP: 109/65  Pulse: 65  Resp: 20  Temp: 97.8 F (36.6 C)     Body mass index is 30.36 kg/m.    ECOG FS:1  - Symptomatic but completely ambulatory  General: Well-developed, well-nourished, no acute distress. Eyes: Pink conjunctiva, anicteric sclera. Lungs: Clear to auscultation bilaterally. Heart: Regular rate and rhythm. No rubs, murmurs, or gallops. Abdomen: Soft, nontender, nondistended. No organomegaly noted, normoactive bowel sounds. Musculoskeletal: No edema, cyanosis, or clubbing. Left arm in the past. Neuro: Alert, answering all questions appropriately. Cranial nerves grossly intact. Skin: Maculopapular rash noted, Improved. Psych: Normal affect.    LAB RESULTS:  Lab Results  Component Value Date   NA 134 (L) 01/05/2017   K 5.2 (H) 01/05/2017   CL 105 01/05/2017   CO2 25 01/05/2017   GLUCOSE 107 (H) 01/05/2017   BUN 13 01/05/2017   CREATININE 0.86 01/05/2017   CALCIUM 8.3 (L) 01/05/2017   PROT 6.0 (L) 12/31/2016   ALBUMIN 2.5 (L) 12/31/2016   AST 18 12/31/2016  ALT 14 12/31/2016   ALKPHOS 97 12/31/2016   BILITOT 1.2 12/31/2016   GFRNONAA >60 01/05/2017   GFRAA >60 01/05/2017    Lab Results  Component Value Date   WBC 3.0 (L) 01/05/2017   NEUTROABS 4.0 12/31/2016   HGB 9.2 (L) 01/05/2017   HCT 27.1 (L) 01/05/2017   MCV 82.4 01/05/2017   PLT 211 01/05/2017     STUDIES: Dg Chest 1 View  Result Date: 12/14/2016 CLINICAL DATA:  Left thoracentesis . EXAM: CHEST 1 VIEW COMPARISON:  12/08/2016. FINDINGS: Interim left left thoracentesis. Removal of significant amount of left pleural fluid with minimal residual effusion. No pneumothorax. Cardiomegaly again noted. Mild bilateral interstitial prominence. Mild component CHF may be present. Pneumonitis cannot be excluded . IMPRESSION: 1. Left thoracentesis with removal of significant amount left pleural fluid. No evidence of pneumothorax . 2. Cardiomegaly with mild bilateral interstitial prominence. Mild component of CHF view present. Mild pneumonitis cannot be excluded . Electronically Signed   By: Marcello Moores  Register   On:  12/14/2016 14:36   Dg Chest 2 View  Result Date: 12/31/2016 CLINICAL DATA:  83 y/o F; shortness of breath. History of lung cancer. EXAM: CHEST  2 VIEW COMPARISON:  12/26/2016 chest radiographs FINDINGS: Left pleural drain in situ. Left port catheter tip projects over the upper SVC. Increase left-sided pleural effusion. Left hemithorax pleural nodularity also appears increased from prior chest radiograph although this may be projectional due to patient rotation. New opacity in the left mid lung zone may represent a focus of pneumonitis. Mildly enlarged cardiac silhouette. Mild reverse S curvature of the thoracic spine. Right upper quadrant cholecystectomy clips. IMPRESSION: 1. Increased left effusion. Apparent increase in pleural nodularity, possibly due to differences in projection. 2. New opacity in left mid lung zone may represent a focus of pneumonitis. 3. Stable position of left pleural drain and port catheter. Electronically Signed   By: Kristine Garbe M.D.   On: 12/31/2016 17:11   Dg Chest 2 View  Result Date: 12/26/2016 CLINICAL DATA:  Left chest pain.  Pleural effusion. EXAM: CHEST  2 VIEW COMPARISON:  12/18/2016 FINDINGS: Left Port-A-Cath remains in place, unchanged. PleurX drainage catheter again noted at the left base. No pneumothorax. No visible significant effusion. Heart is normal size. There is hyperinflation of the lungs compatible with COPD. Chronic bibasilar densities likely reflect atelectasis or scarring. IMPRESSION: Left PleurX drainage catheter remains in place.  No pneumothorax. Chronic bibasilar atelectasis or scarring. COPD. Electronically Signed   By: Rolm Baptise M.D.   On: 12/26/2016 08:04   Dg Wrist Complete Left  Result Date: 01/02/2017 CLINICAL DATA:  Fall with bruising EXAM: LEFT WRIST - COMPLETE 3+ VIEW COMPARISON:  None. FINDINGS: Diffuse osteopenia. Comminuted, impacted intra-articular distal radius fracture. About 1/2 shaft diameter of dorsal displacement  of distal fracture fragment and moderate dorsal angulation. Comminuted, impacted distal radius fracture with mild dorsal angulation. IMPRESSION: 1. Osteopenia 2. Comminuted, impacted intra-articular distal radius fracture with displacement and angulation. 3. Comminuted, slightly angulated distal ulna fracture. Electronically Signed   By: Donavan Foil M.D.   On: 01/02/2017 14:32   Ct Head Wo Contrast  Result Date: 01/02/2017 CLINICAL DATA:  Recent trip and fall with head injury, no neuro deficit, initial encounter EXAM: CT HEAD WITHOUT CONTRAST TECHNIQUE: Contiguous axial images were obtained from the base of the skull through the vertex without intravenous contrast. COMPARISON:  None. FINDINGS: Brain: Mild atrophic changes are noted. No findings to suggest acute hemorrhage, acute infarction or space-occupying mass  lesion are noted. Vascular: No hyperdense vessel or unexpected calcification. Skull: Normal. Negative for fracture or focal lesion. Sinuses/Orbits: No acute finding. Other: None. IMPRESSION: Mild atrophic changes without acute intracranial abnormality. Electronically Signed   By: Inez Catalina M.D.   On: 01/02/2017 14:18   Dg Chest Port 1 View  Result Date: 12/18/2016 CLINICAL DATA:  Chest tube placement EXAM: PORTABLE CHEST 1 VIEW COMPARISON:  12/14/2016, 12/08/2016, PET-CT 11/29/2016 FINDINGS: Placement of a left-sided central venous port with the tip projecting over the SVC confluence. Interim insertion of left lower chest tube with poorly visible tip, possibly coiled at the left CP angle. Decreased left pleural effusion or thickening. Patchy atelectasis or infiltrate at the left base. Stable cardiomediastinal silhouette. Questionable tiny left apical pneumothorax. IMPRESSION: 1. Insertion of left lower chest drainage tube with poorly visible tip, it is possibly coiled at the left CP angle 2. Decreased left pleural effusion or thickening. Possible tiny left apical pneumothorax. 3. Patchy  atelectasis or infiltrate at the left lung base 4. Mild cardiomegaly Electronically Signed   By: Donavan Foil M.D.   On: 12/18/2016 15:34   Dg C-arm 1-60 Min-no Report  Result Date: 12/18/2016 Fluoroscopy was utilized by the requesting physician.  No radiographic interpretation.   US Thoracentesis Asp Pleural Space W/img Guide  Result Date: 12/14/2016 INDICATION: Malignant left pleural effusion. EXAM: ULTRASOUND GUIDED LEFT THORACENTESIS MEDICATIONS: None. COMPLICATIONS: None immediate. PROCEDURE: An ultrasound guided thoracentesis was thoroughly discussed with the patient and questions answered. The benefits, risks, alternatives and complications were also discussed. The patient understands and wishes to proceed with the procedure. Written consent was obtained. Ultrasound was performed to localize and mark an adequate pocket of fluid in the left chest. The area was then prepped and draped in the normal sterile fashion. 1% Lidocaine was used for local anesthesia. Under ultrasound guidance a 6 Fr Safe-T-Centesis catheter was introduced. Thoracentesis was performed. The catheter was removed and a dressing applied. FINDINGS: A total of approximately 1.3 L of amber colored fluid was removed. IMPRESSION: Successful ultrasound guided left thoracentesis yielding 1.3 L of pleural fluid. Electronically Signed   By: Markus Daft M.D.   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 Patient refused an MRI of the brain, but did have a head CT on January 02, 2017 that did not report metastatic disease. Treatment has been delayed secondary to her broken arm, but patient wishes to reinitiate chemotherapy next week.  Given her advanced age will dose reduce carboplatinum and give weekly Taxol. Patient will receive carboplatinum AUC 5 on day 1 and Taxol 80 mg/m on  days 1, 8, and 15. This will be a 28 day cycle. Patient has indicated that if her quality of life is effected negatively or side effects are intolerable, she would likely discontinue treatment. Her PDL-1 level is 90%, therefore immunotherapy may be of benefit in the future. Hospice was previsouly discussed, but the patient is not ready for this at this time. Return to clinic in 1 week for consideration of cycle 2, day 1. 2. Broken arm: Continue follow-up with orthopedics as scheduled.   2. Pain: Continue current narcotic regimen. 4. Rash: Resolved. 5. Hyponatremia: Monitor.  Approximately 30 minutes was spent in discussion of which greater than 50% was consultation.  Patient expressed understanding and was in agreement with  this plan. She also understands that She can call clinic at any time with any questions, concerns, or complaints.   Cancer Staging Squamous cell carcinoma lung, left ([place]) Staging form: Lung, AJCC 8th Edition - Clinical stage from 12/13/2016: Stage IVA (cT4, cN0, cM1a) - Signed by [name], MD on 12/13/2016   [name], MD   01/12/2017 8:57 AM

## 2017-01-11 ENCOUNTER — Inpatient Hospital Stay (HOSPITAL_BASED_OUTPATIENT_CLINIC_OR_DEPARTMENT_OTHER): Payer: Medicare HMO | Admitting: Oncology

## 2017-01-11 VITALS — BP 109/65 | HR 65 | Temp 97.8°F | Resp 20 | Wt 171.4 lb

## 2017-01-11 DIAGNOSIS — R531 Weakness: Secondary | ICD-10-CM

## 2017-01-11 DIAGNOSIS — C7951 Secondary malignant neoplasm of bone: Secondary | ICD-10-CM

## 2017-01-11 DIAGNOSIS — Z7982 Long term (current) use of aspirin: Secondary | ICD-10-CM | POA: Diagnosis not present

## 2017-01-11 DIAGNOSIS — C3492 Malignant neoplasm of unspecified part of left bronchus or lung: Secondary | ICD-10-CM | POA: Diagnosis not present

## 2017-01-11 DIAGNOSIS — I509 Heart failure, unspecified: Secondary | ICD-10-CM

## 2017-01-11 DIAGNOSIS — I4891 Unspecified atrial fibrillation: Secondary | ICD-10-CM | POA: Diagnosis not present

## 2017-01-11 DIAGNOSIS — E871 Hypo-osmolality and hyponatremia: Secondary | ICD-10-CM

## 2017-01-11 DIAGNOSIS — R5383 Other fatigue: Secondary | ICD-10-CM

## 2017-01-11 DIAGNOSIS — I700 Atherosclerosis of aorta: Secondary | ICD-10-CM | POA: Diagnosis not present

## 2017-01-11 DIAGNOSIS — J449 Chronic obstructive pulmonary disease, unspecified: Secondary | ICD-10-CM | POA: Diagnosis not present

## 2017-01-11 DIAGNOSIS — Z8701 Personal history of pneumonia (recurrent): Secondary | ICD-10-CM | POA: Diagnosis not present

## 2017-01-11 DIAGNOSIS — Z8781 Personal history of (healed) traumatic fracture: Secondary | ICD-10-CM

## 2017-01-11 DIAGNOSIS — Z8711 Personal history of peptic ulcer disease: Secondary | ICD-10-CM | POA: Diagnosis not present

## 2017-01-11 DIAGNOSIS — Z87891 Personal history of nicotine dependence: Secondary | ICD-10-CM

## 2017-01-11 DIAGNOSIS — J90 Pleural effusion, not elsewhere classified: Secondary | ICD-10-CM | POA: Diagnosis not present

## 2017-01-11 DIAGNOSIS — Z5111 Encounter for antineoplastic chemotherapy: Secondary | ICD-10-CM | POA: Diagnosis present

## 2017-01-11 DIAGNOSIS — Z79899 Other long term (current) drug therapy: Secondary | ICD-10-CM

## 2017-01-11 DIAGNOSIS — R21 Rash and other nonspecific skin eruption: Secondary | ICD-10-CM | POA: Diagnosis not present

## 2017-01-11 NOTE — Progress Notes (Signed)
Patient here today for hospital follow up, currently at [facility]. Patient reports decreased appetite, change in taste of foods that she likes.

## 2017-01-15 ENCOUNTER — Ambulatory Visit (INDEPENDENT_AMBULATORY_CARE_PROVIDER_SITE_OTHER): Payer: Medicare HMO | Admitting: Cardiothoracic Surgery

## 2017-01-15 ENCOUNTER — Telehealth: Payer: Self-pay

## 2017-01-15 ENCOUNTER — Encounter: Payer: Self-pay | Admitting: Cardiothoracic Surgery

## 2017-01-15 ENCOUNTER — Ambulatory Visit
Admission: RE | Admit: 2017-01-15 | Discharge: 2017-01-15 | Disposition: A | Discharge status: Home / Self Care | Payer: Medicare HMO | Source: Ambulatory Visit | Attending: Cardiothoracic Surgery | Admitting: Cardiothoracic Surgery

## 2017-01-15 VITALS — BP 110/66 | HR 68 | Temp 98.3°F

## 2017-01-15 DIAGNOSIS — Z87891 Personal history of nicotine dependence: Secondary | ICD-10-CM | POA: Diagnosis not present

## 2017-01-15 DIAGNOSIS — J449 Chronic obstructive pulmonary disease, unspecified: Secondary | ICD-10-CM | POA: Diagnosis not present

## 2017-01-15 DIAGNOSIS — R0602 Shortness of breath: Secondary | ICD-10-CM

## 2017-01-15 DIAGNOSIS — I700 Atherosclerosis of aorta: Secondary | ICD-10-CM | POA: Insufficient documentation

## 2017-01-15 NOTE — Progress Notes (Signed)
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  INTERVAL HISTORY: Patient returns to clinic today for hospital follow-up and consideration of cycle 2, day 1 of carboplatinum and Taxol.  Her weakness and fatigue are improving and she is nearly back to her baseline.  She has no neurologic complaints. She does not complain of rib pain today. She has a fair appetite, but denies weight loss. She has no chest pain, shortness of breath, cough, or hemoptysis. She denies any nausea, vomiting, constipation, or diarrhea. She has no urinary complaints. Patient offers no further specific complaints today.  REVIEW OF SYSTEMS:   Review of Systems  Constitutional: Positive for malaise/fatigue. Negative for fever and weight loss.  Respiratory: Negative.  Negative for cough and shortness of breath.   Cardiovascular: Negative.  Negative for chest pain and leg swelling.  Gastrointestinal: Negative.  Negative for abdominal pain.  Genitourinary: Negative.   Musculoskeletal: Negative.  Negative for back pain, falls and joint pain.  Skin: Negative.  Negative for itching and rash.  Neurological: Positive for weakness. Negative for sensory change.  Psychiatric/Behavioral: Negative.  The patient is not nervous/anxious.     As per HPI. Otherwise, a complete review of systems is negative.  PAST MEDICAL HISTORY: Past Medical History:  Diagnosis Date  . COPD (chronic obstructive pulmonary disease) ([place])   . GU (gastric peptic ulcer)   . Lung cancer ([place])   . Malignant pleural effusion   . PAF (paroxysmal atrial fibrillation) ([place]) 10/2016   a. 10/2016 post thoracentesis ; b. 10/2016 Echo: EF 65-70%, mild LVH;  c. Amio/Eliquis initiated  (CHA2DSVASc = 3).  . Pneumonia   . Wrist fracture 1993   bilateral    PAST SURGICAL HISTORY: Past Surgical History:  Procedure Laterality Date  . ABDOMINAL HYSTERECTOMY    . CATARACT EXTRACTION W/ INTRAOCULAR LENS  IMPLANT, BILATERAL    . CHEST TUBE INSERTION Left 12/18/2016   Procedure: CHEST TUBE INSERTION;  Surgeon: [name], MD;  Location: [facility] ORS;  Service: General;  Laterality: Left;  . CHOLECYSTECTOMY    . CLOSED REDUCTION WRIST FRACTURE Left 01/04/2017   Procedure: CLOSED REDUCTION WRIST;  Surgeon: Thornton Park, MD;  Location: [facility] ORS;  Service: Orthopedics;  Laterality: Left;  . PORTACATH PLACEMENT Left 12/18/2016   Procedure: INSERTION PORT-A-CATH;  Surgeon: [name], MD;  Location: [facility] ORS;  Service: General;  Laterality: Left;  . REMOVAL OF PLEURAL DRAINAGE CATHETER N/A 01/02/2017   Procedure: REMOVAL OF PLEURAL DRAINAGE CATHETER;  Surgeon: [name], MD;  Location: [facility] ORS;  Service: Thoracic;  Laterality: N/A;  . TONSILLECTOMY      FAMILY HISTORY: Family History  Problem Relation Age of Onset  . Hypertension Father   . Heart disease Father   . Heart attack Father   . Congestive Heart Failure Mother   . Stroke Mother   . Atrial fibrillation Sister     ADVANCED DIRECTIVES (Y/N):  N  HEALTH MAINTENANCE: Social History  Substance Use Topics  . Smoking status: Former Smoker    Packs/day: 1.00    Years: 68.00    Quit date: 11/06/2016  . Smokeless tobacco: Never Used  . Alcohol use No     Colonoscopy:  PAP:  Bone density:  Lipid panel:  Allergies  Allergen Reactions  . Sulfa Antibiotics Other (See Comments)    Seizures   . Levaquin [Levofloxacin] Hives and Itching    Current Outpatient Prescriptions  Medication Sig Dispense Refill  . albuterol (PROVENTIL) (2.5 MG/3ML) 0.083% nebulizer solution Take 3 mLs (2.5 mg total) by nebulization 2 (two) times daily. 75 mL 12  . amiodarone (PACERONE) 200 MG tablet Take by mouth.    Marland Kitchen  apixaban (ELIQUIS) 5 MG TABS tablet Take 1 tablet (5 mg total) by mouth 2 (two) times daily. 60 tablet 0  . cetirizine (ZYRTEC) 10 MG tablet Take 10 mg by mouth daily as needed for allergies.    . cholecalciferol (VITAMIN D) 1000 units tablet Take 1,000 Units by mouth daily.    Marland Kitchen diltiazem (CARDIZEM CD) 120 MG 24 hr capsule Take 1 capsule (120 mg total) by mouth daily.    . furosemide (LASIX) 20 MG tablet Take 1 tablet by mouth daily as needed. For EDEMA  3  . HYDROcodone-acetaminophen (NORCO) 5-325 MG tablet Take 1 tablet by mouth every 4 (four) hours as needed for moderate pain. 20 tablet 0  . KLOR-CON M20 20 MEQ tablet Take 40 mEq by mouth 2 (two) times daily.  0  . lidocaine-prilocaine (EMLA) cream Apply to affected area once 30 g 3  . Multiple Vitamins-Minerals (MULTIVITAMIN ADULT PO) Take by mouth.    . nystatin-triamcinolone ointment (MYCOLOG) APPLY TO AFFECTED AREA TWICE A DAY    . omeprazole (PRILOSEC) 20 MG capsule Take 20 mg by mouth daily.    . ondansetron (ZOFRAN) 8 MG tablet Take 1 tablet (8 mg total) by mouth 2 (two) times daily as needed for refractory nausea / vomiting. 60 tablet 2  . prochlorperazine (COMPAZINE) 10 MG tablet Take 1 tablet (10 mg total) by mouth every 6 (six) hours as needed (Nausea or vomiting). 60 tablet 2  . protein supplement shake (PREMIER PROTEIN) LIQD Take 325 mLs (11 oz total) by mouth 2 (two) times daily between meals. (Patient not taking: Reported on 01/18/2017) 60 Can 0   No current facility-administered medications for this visit.    Facility-Administered Medications Ordered in Other Visits  Medication Dose Route Frequency Provider Last Rate Last Dose  . CARBOplatin (PARAPLATIN) 390 mg in sodium chloride 0.9 % 250 mL chemo infusion  390 mg Intravenous Once [name], MD      . heparin lock flush 100 unit/mL  500 Units Intravenous Once [name], MD      . PACLitaxel (TAXOL) 150 mg in dextrose 5 % 250 mL chemo infusion (</= 11m/m2)   80 mg/m2 (Treatment Plan Recorded) Intravenous Once FLloyd Huger MD   Stopped at 01/18/17 1153  . sodium chloride flush (NS) 0.9 % injection 10 mL  10 mL Intravenous PRN FLloyd Huger MD   10 mL at 01/18/17 0856    OBJECTIVE: Vitals:   01/18/17 0929  BP: 113/60  Pulse: 64  Resp: 18  Temp: 97.9 F (36.6 C)     Body mass index is 28.75 kg/m.    ECOG FS:1 - Symptomatic but completely ambulatory  General: Well-developed, well-nourished, no acute distress. Eyes: Pink conjunctiva, anicteric sclera. Lungs: Clear to auscultation bilaterally. Heart: Regular rate and rhythm. No rubs, murmurs, or gallops. Abdomen: Soft, nontender, nondistended. No organomegaly noted, normoactive bowel sounds. Musculoskeletal: No edema, cyanosis, or clubbing. Left arm in cast. Neuro: Alert, answering all questions appropriately. Cranial nerves grossly intact. Skin: Maculopapular rash noted, Improved. Psych: Normal affect.  LAB RESULTS:  Lab Results  Component Value Date   NA 136 01/18/2017   K 2.9 (L) 01/18/2017   CL 102 01/18/2017   CO2 26 01/18/2017   GLUCOSE 126 (H) 01/18/2017   BUN 15 01/18/2017   CREATININE 0.99 01/18/2017   CALCIUM 8.2 (L) 01/18/2017   PROT 5.9 (L) 01/18/2017   ALBUMIN 2.3 (L) 01/18/2017   AST 17 01/18/2017   ALT 10 (L) 01/18/2017   ALKPHOS 131 (H) 01/18/2017   BILITOT 0.6 01/18/2017   GFRNONAA 52 (L) 01/18/2017   GFRAA 60 (L) 01/18/2017    Lab Results  Component Value Date   WBC 8.7 01/18/2017   NEUTROABS 6.4 01/18/2017   HGB 9.6 (L) 01/18/2017   HCT 28.6 (L) 01/18/2017   MCV 82.6 01/18/2017   PLT 228 01/18/2017     STUDIES: Dg Chest 2 View  Result Date: 01/15/2017 CLINICAL DATA:  Shortness of breath. Known lung malignancy. Removal of pleural drainage catheter 13 days ago. Former smoker. EXAM: CHEST  2 VIEW COMPARISON:  Chest x-ray of December 31, 2016 FINDINGS: The right lung is well-expanded and clear. On the left there is base small  amount of pleural fluid and moderate amount of pleural thickening noted inferiorly. The volume of fluid is not greatly changed since the previous study. Patchy parenchymal density is present in the lingula and left lower lobe and this also is stable. The heart is normal in size. The pulmonary vascularity is not engorged. The power port catheter tip projects over the midportion of the SVC. There is calcification in the wall of the thoracic aorta. The bony thorax exhibits no acute abnormality. IMPRESSION: Fairly stable appearance of the chest with persistently increased pleuroparenchymal density in the lower 1/2 of the left lung. No pneumothorax nor large pleural effusion. Underlying COPD. Thoracic aortic atherosclerosis. Electronically Signed   By: David  Martinique M.D.   On: 01/15/2017 14:59   Dg Chest 2 View  Result Date: 12/31/2016 CLINICAL DATA:  83 y/o F; shortness of breath. History of lung cancer. EXAM: CHEST  2 VIEW COMPARISON:  12/26/2016 chest radiographs FINDINGS: Left pleural drain in situ. Left port catheter tip projects over the upper SVC. Increase left-sided pleural effusion. Left hemithorax pleural nodularity also appears increased from prior chest radiograph although this may be projectional due to patient rotation. New opacity in the left mid lung zone may represent a focus of pneumonitis. Mildly enlarged cardiac silhouette. Mild reverse S curvature of the thoracic spine. Right upper quadrant cholecystectomy clips. IMPRESSION: 1. Increased left effusion. Apparent increase in pleural nodularity, possibly due to differences in projection. 2. New opacity in left mid lung zone may represent a focus of pneumonitis. 3. Stable position of left pleural drain and port catheter. Electronically Signed   By: Kristine Garbe M.D.   On: 12/31/2016 17:11   Dg Chest 2 View  Result Date: 12/26/2016 CLINICAL DATA:  Left chest pain.  Pleural effusion. EXAM: CHEST  2 VIEW COMPARISON:  12/18/2016  FINDINGS: Left Port-A-Cath remains in place, unchanged. PleurX drainage catheter again noted at the left base. No pneumothorax. No visible significant effusion. Heart is normal size. There is hyperinflation of the lungs compatible with COPD. Chronic bibasilar densities likely reflect atelectasis or scarring. IMPRESSION: Left PleurX drainage catheter remains in place.  No pneumothorax. Chronic bibasilar atelectasis or scarring. COPD. Electronically Signed   By: Rolm Baptise M.D.   On: 12/26/2016 08:04   Dg Wrist Complete Left  Result Date: 01/02/2017 CLINICAL DATA:  Fall  with bruising EXAM: LEFT WRIST - COMPLETE 3+ VIEW COMPARISON:  None. FINDINGS: Diffuse osteopenia. Comminuted, impacted intra-articular distal radius fracture. About 1/2 shaft diameter of dorsal displacement of distal fracture fragment and moderate dorsal angulation. Comminuted, impacted distal radius fracture with mild dorsal angulation. IMPRESSION: 1. Osteopenia 2. Comminuted, impacted intra-articular distal radius fracture with displacement and angulation. 3. Comminuted, slightly angulated distal ulna fracture. Electronically Signed   By: Donavan Foil M.D.   On: 01/02/2017 14:32   Ct Head Wo Contrast  Result Date: 01/02/2017 CLINICAL DATA:  Recent trip and fall with head injury, no neuro deficit, initial encounter EXAM: CT HEAD WITHOUT CONTRAST TECHNIQUE: Contiguous axial images were obtained from the base of the skull through the vertex without intravenous contrast. COMPARISON:  None. FINDINGS: Brain: Mild atrophic changes are noted. No findings to suggest acute hemorrhage, acute infarction or space-occupying mass lesion are noted. Vascular: No hyperdense vessel or unexpected calcification. Skull: Normal. Negative for fracture or focal lesion. Sinuses/Orbits: No acute finding. Other: None. IMPRESSION: Mild atrophic changes without acute intracranial abnormality. Electronically Signed   By: Inez Catalina M.D.   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 Patient refused an MRI of the brain, but did have a head CT on January 02, 2017 that did not report metastatic disease. Treatment was delayed secondary to her broken arm.  Given her advanced age will dose reduce carboplatinum and give weekly Taxol. Patient will receive carboplatinum AUC 5 on day 1 and Taxol 80 mg/m on days 1, 8, and 15. This will be a 28 day cycle. Patient has indicated that if her quality of life is effected negatively or side effects are intolerable, she would likely discontinue treatment. Her PDL-1 level is 90%, therefore immunotherapy may be of benefit in the future. Hospice was previsouly discussed, but the patient is not ready for this at this time. Proceed with cycle 2, day 1 of carboplatinum and Taxol today.  Return to clinic in 1 week for consideration of cycle 2, day 8 which is Taxol only. 2. Broken arm: Continue follow-up with orthopedics as scheduled.   2. Pain: Continue current narcotic regimen. 4. Rash: Resolved. 5. Hyponatremia: Resolved. 6. Hypokalemia: Continue oral potassium supplementation.  Patient also received 40 mEq IV potassium chloride today.  Patient expressed understanding and was in agreement with this plan. She also understands that She can call clinic at any time with any questions, concerns, or complaints.   Cancer Staging Squamous cell carcinoma lung, left ([place]) Staging form: Lung, AJCC 8th Edition - Clinical stage from 12/13/2016: Stage IVA (cT4, cN0, cM1a) - Signed by [name], MD on 12/13/2016   [name], MD   01/18/2017 11:26 AM

## 2017-01-15 NOTE — Telephone Encounter (Signed)
Called patient's daughter, [name] to let her know that Dr. Genevive Bi reviewed her chest X-Ray and that it looked great. Therefore, their was no need to see her back. However, she will need to see Dr. Ola Spurr and her oncologist. [name] understood and had no further questions.

## 2017-01-15 NOTE — Progress Notes (Signed)
[name] returns today in follow-up of her infected Pleurx catheter.  She was in the hospital week or so ago for an infection around the Pleurx.  It was removed.  The wounds were packed.  She is currently at [facility] and the nursing staff are packing the wound.  The patient states that she feels very well without any shortness of breath or chest pain.  She did fall when she was here in the hospital and fractured her left arm.  Her left arm is currently in a cast.  She also had a history of atrial fibrillation and is scheduled to follow-up today with Dr. Donette Larry.  She has a small bruise on her left infraorbital region.  Her left chest wall is free of any erythema or induration.  The Pleurx catheter site will admit only the tip of a Q-tip.  Her lungs are clear on the right and slightly diminished on the left.  She has some bibasilar rales more so on the left.  Her heart is regular.  Her Port-A-Cath site is clean and dry.  I would like to obtain a chest x-ray today.  She will get that today.  She will contact my office to review those results.  Follow-up will be made after that.

## 2017-01-15 NOTE — Patient Instructions (Signed)
Please go downstairs to the [facility] when you are done with Dr. Saralyn Pilar for your chest x-ray.

## 2017-01-18 ENCOUNTER — Inpatient Hospital Stay: Payer: Medicare HMO | Attending: Oncology

## 2017-01-18 ENCOUNTER — Inpatient Hospital Stay (HOSPITAL_BASED_OUTPATIENT_CLINIC_OR_DEPARTMENT_OTHER): Payer: Medicare HMO | Admitting: Oncology

## 2017-01-18 ENCOUNTER — Inpatient Hospital Stay: Payer: Medicare HMO

## 2017-01-18 VITALS — BP 113/60 | HR 64 | Temp 97.9°F | Resp 18 | Wt 162.3 lb

## 2017-01-18 DIAGNOSIS — J910 Malignant pleural effusion: Secondary | ICD-10-CM | POA: Diagnosis not present

## 2017-01-18 DIAGNOSIS — Z8781 Personal history of (healed) traumatic fracture: Secondary | ICD-10-CM | POA: Diagnosis not present

## 2017-01-18 DIAGNOSIS — E860 Dehydration: Secondary | ICD-10-CM | POA: Insufficient documentation

## 2017-01-18 DIAGNOSIS — D649 Anemia, unspecified: Secondary | ICD-10-CM | POA: Diagnosis not present

## 2017-01-18 DIAGNOSIS — R630 Anorexia: Secondary | ICD-10-CM | POA: Insufficient documentation

## 2017-01-18 DIAGNOSIS — Z7901 Long term (current) use of anticoagulants: Secondary | ICD-10-CM | POA: Insufficient documentation

## 2017-01-18 DIAGNOSIS — J449 Chronic obstructive pulmonary disease, unspecified: Secondary | ICD-10-CM | POA: Diagnosis not present

## 2017-01-18 DIAGNOSIS — I480 Paroxysmal atrial fibrillation: Secondary | ICD-10-CM

## 2017-01-18 DIAGNOSIS — Z8701 Personal history of pneumonia (recurrent): Secondary | ICD-10-CM | POA: Diagnosis not present

## 2017-01-18 DIAGNOSIS — E876 Hypokalemia: Secondary | ICD-10-CM

## 2017-01-18 DIAGNOSIS — D61810 Antineoplastic chemotherapy induced pancytopenia: Secondary | ICD-10-CM | POA: Diagnosis not present

## 2017-01-18 DIAGNOSIS — R197 Diarrhea, unspecified: Secondary | ICD-10-CM | POA: Insufficient documentation

## 2017-01-18 DIAGNOSIS — Z8711 Personal history of peptic ulcer disease: Secondary | ICD-10-CM | POA: Diagnosis not present

## 2017-01-18 DIAGNOSIS — Z5111 Encounter for antineoplastic chemotherapy: Secondary | ICD-10-CM | POA: Insufficient documentation

## 2017-01-18 DIAGNOSIS — E871 Hypo-osmolality and hyponatremia: Secondary | ICD-10-CM | POA: Insufficient documentation

## 2017-01-18 DIAGNOSIS — Z79899 Other long term (current) drug therapy: Secondary | ICD-10-CM

## 2017-01-18 DIAGNOSIS — C3492 Malignant neoplasm of unspecified part of left bronchus or lung: Secondary | ICD-10-CM

## 2017-01-18 DIAGNOSIS — Z87891 Personal history of nicotine dependence: Secondary | ICD-10-CM

## 2017-01-18 DIAGNOSIS — Z5112 Encounter for antineoplastic immunotherapy: Secondary | ICD-10-CM | POA: Insufficient documentation

## 2017-01-18 DIAGNOSIS — R110 Nausea: Secondary | ICD-10-CM | POA: Diagnosis not present

## 2017-01-18 LAB — CBC WITH DIFFERENTIAL/PLATELET
BASOS ABS: 0.1 10*3/uL (ref 0–0.1)
BASOS PCT: 1 %
EOS PCT: 2 %
Eosinophils Absolute: 0.1 10*3/uL (ref 0–0.7)
HEMATOCRIT: 28.6 % — AB (ref 35.0–47.0)
Hemoglobin: 9.6 g/dL — ABNORMAL LOW (ref 12.0–16.0)
LYMPHS PCT: 15 %
Lymphs Abs: 1.3 10*3/uL (ref 1.0–3.6)
MCH: 27.7 pg (ref 26.0–34.0)
MCHC: 33.5 g/dL (ref 32.0–36.0)
MCV: 82.6 fL (ref 80.0–100.0)
MONO ABS: 0.7 10*3/uL (ref 0.2–0.9)
Monocytes Relative: 9 %
NEUTROS ABS: 6.4 10*3/uL (ref 1.4–6.5)
Neutrophils Relative %: 73 %
PLATELETS: 228 10*3/uL (ref 150–440)
RBC: 3.47 MIL/uL — AB (ref 3.80–5.20)
RDW: 17.3 % — AB (ref 11.5–14.5)
WBC: 8.7 10*3/uL (ref 3.6–11.0)

## 2017-01-18 LAB — COMPREHENSIVE METABOLIC PANEL
ALBUMIN: 2.3 g/dL — AB (ref 3.5–5.0)
ALT: 10 U/L — AB (ref 14–54)
AST: 17 U/L (ref 15–41)
Alkaline Phosphatase: 131 U/L — ABNORMAL HIGH (ref 38–126)
Anion gap: 8 (ref 5–15)
BUN: 15 mg/dL (ref 6–20)
CHLORIDE: 102 mmol/L (ref 101–111)
CO2: 26 mmol/L (ref 22–32)
Calcium: 8.2 mg/dL — ABNORMAL LOW (ref 8.9–10.3)
Creatinine, Ser: 0.99 mg/dL (ref 0.44–1.00)
GFR calc Af Amer: 60 mL/min — ABNORMAL LOW (ref >60)
GFR, EST NON AFRICAN AMERICAN: 52 mL/min — AB (ref >60)
GLUCOSE: 126 mg/dL — AB (ref 65–99)
POTASSIUM: 2.9 mmol/L — AB (ref 3.5–5.1)
Sodium: 136 mmol/L (ref 135–145)
Total Bilirubin: 0.6 mg/dL (ref 0.3–1.2)
Total Protein: 5.9 g/dL — ABNORMAL LOW (ref 6.5–8.1)

## 2017-01-18 MED ORDER — SODIUM CHLORIDE 0.9 % IV SOLN
Freq: Once | INTRAVENOUS | Status: AC
  Administered 2017-01-18: 10:00:00 via INTRAVENOUS
  Filled 2017-01-18: qty 1000

## 2017-01-18 MED ORDER — PACLITAXEL CHEMO INJECTION 300 MG/50ML
80.0000 mg/m2 | Freq: Once | INTRAVENOUS | Status: AC
  Administered 2017-01-18: 150 mg via INTRAVENOUS
  Filled 2017-01-18: qty 25

## 2017-01-18 MED ORDER — DEXAMETHASONE SODIUM PHOSPHATE 10 MG/ML IJ SOLN
10.0000 mg | Freq: Once | INTRAMUSCULAR | Status: AC
  Administered 2017-01-18: 10 mg via INTRAVENOUS
  Filled 2017-01-18: qty 1

## 2017-01-18 MED ORDER — PALONOSETRON HCL INJECTION 0.25 MG/5ML
0.2500 mg | Freq: Once | INTRAVENOUS | Status: AC
  Administered 2017-01-18: 0.25 mg via INTRAVENOUS
  Filled 2017-01-18: qty 5

## 2017-01-18 MED ORDER — HEPARIN SOD (PORK) LOCK FLUSH 100 UNIT/ML IV SOLN
INTRAVENOUS | Status: AC
  Filled 2017-01-18: qty 5

## 2017-01-18 MED ORDER — DIPHENHYDRAMINE HCL 50 MG/ML IJ SOLN
25.0000 mg | Freq: Once | INTRAMUSCULAR | Status: AC
  Administered 2017-01-18: 25 mg via INTRAVENOUS
  Filled 2017-01-18: qty 1

## 2017-01-18 MED ORDER — HEPARIN SOD (PORK) LOCK FLUSH 100 UNIT/ML IV SOLN
500.0000 [IU] | Freq: Once | INTRAVENOUS | Status: AC
  Administered 2017-01-18: 500 [IU] via INTRAVENOUS
  Filled 2017-01-18: qty 5

## 2017-01-18 MED ORDER — CARBOPLATIN CHEMO INJECTION 450 MG/45ML
390.0000 mg | Freq: Once | INTRAVENOUS | Status: AC
  Administered 2017-01-18: 390 mg via INTRAVENOUS
  Filled 2017-01-18: qty 39

## 2017-01-18 MED ORDER — SODIUM CHLORIDE 0.9 % IV SOLN
40.0000 meq | Freq: Once | INTRAVENOUS | Status: AC
  Administered 2017-01-18: 40 meq via INTRAVENOUS
  Filled 2017-01-18: qty 20

## 2017-01-18 MED ORDER — SODIUM CHLORIDE 0.9 % IV SOLN: 40.0000 meq | Freq: Once | INTRAVENOUS | Status: DC

## 2017-01-18 MED ORDER — SODIUM CHLORIDE 0.9% FLUSH
10.0000 mL | INTRAVENOUS | Status: DC | PRN
Start: 2017-01-18 — End: 2017-01-18
  Administered 2017-01-18: 10 mL via INTRAVENOUS
  Filled 2017-01-18: qty 10

## 2017-01-18 MED ORDER — FAMOTIDINE IN NACL 20-0.9 MG/50ML-% IV SOLN
20.0000 mg | Freq: Once | INTRAVENOUS | Status: AC
  Administered 2017-01-18: 20 mg via INTRAVENOUS
  Filled 2017-01-18: qty 50

## 2017-01-18 NOTE — Progress Notes (Signed)
Nutrition Assessment   Reason for Assessment:   Patient identified on Malnutrition Screening Report for poor appetite and weight loss  ASSESSMENT:  83 year old female with lung cancer.  Planning to restart chemotherapy.  Noted recent hospital admission, currently has broken arm and is at [facility].  Planning to come home next week as getting stronger.  Pleurx catheter has been removed.  Past medical history of COPD, gastric peptic ulcer, malignant pleural effusion, afib  Met with patient and daughter in clinic this am.  Patient reports that she has no appetite but she is making herself eat.  Reports that she has never been a breakfast eater and at [facility] breakfast comes at 7 am.  She typically skips it. Also is not happy with the way oatmeal and eggs are prepared at SNF.  Reports typically for lunch and dinner eats soup.  Does like strawberry ice cream, peaches and cottage cheese.  Reports taste changes but can tend to taste sweeter things.    Patient does not like ensure/premier protein drinks.  Daughter reports patient can tolerate boost some.    Reports she is ready to get home so she can prepare meals that she likes.   Nutrition Focused Physical Exam: deferred today  Medications: Vit D, lasix, prilosec, compazine  Labs: reviewed  Anthropometrics:   Height: 63 inches Weight: 162 lb 4.8 oz today in clinic UBW: Noted last weight on 10/2 167 lb. Weight on 8/28 171 lb BMI: 28  5% weight loss in the 2 months   Estimated Energy Needs  Kcals: 1850-2200 calories/d Protein: 93-110 g/d Fluid: 2.2 L/d  NUTRITION DIAGNOSIS: Inadequate oral intake related to poor appetite as evidenced by 5% weight loss in the last 2 months   MALNUTRITION DIAGNOSIS: continue to monitor   INTERVENTION:   Discussed strategies to help with taste changes and fact sheet given Discussed strategies to increase calories and protein, fact sheet given Discussed difference between oral nutrition  supplement and nutrition facts.  Offered samples and coupons but patient denied at this time. Provided recipes for high calorie, high protein shakes to try from [employer] Contact information provided.    MONITORING, EVALUATION, GOAL: weight trends, intake   NEXT VISIT: Nov 8 during infusion  Ethlyn Alto B. Zenia Resides, [place], [place] Registered Dietitian [phone] (pager)

## 2017-01-23 ENCOUNTER — Ambulatory Visit: Payer: Medicare HMO | Admitting: Nurse Practitioner

## 2017-01-24 ENCOUNTER — Telehealth: Payer: Self-pay

## 2017-01-24 NOTE — Telephone Encounter (Signed)
Juliann Pulse from [employer] called questioning abx given to pt by Dr. Genevive Bi. Made Juliann Pulse aware Dr. Genevive Bi is [facility] and we are [facility]. Provided Juliann Pulse with correct phone number.

## 2017-01-24 NOTE — Progress Notes (Signed)
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  INTERVAL HISTORY: Patient returns to clinic today for hospital follow-up and consideration of cycle 2, day 8 of carboplatinum and Taxol.  Taxol only today.  Her weakness and fatigue are improving and she is nearly back to her baseline.  She has no neurologic complaints. She does not complain of rib or arm pain today. She has a fair appetite, but denies weight loss. She has no chest pain, shortness of breath, cough, or hemoptysis. She denies any nausea, vomiting, constipation, or diarrhea. She has no urinary complaints. Patient offers no further specific complaints today.  REVIEW OF SYSTEMS:   Review of Systems  Constitutional: Positive for malaise/fatigue. Negative for fever and weight loss.  Respiratory: Negative.  Negative for cough and shortness of breath.   Cardiovascular: Negative.  Negative for chest pain and leg swelling.  Gastrointestinal: Negative.  Negative for abdominal pain.  Genitourinary: Negative.   Musculoskeletal: Negative.  Negative for back pain, falls and joint pain.  Skin: Negative.  Negative for itching and rash.  Neurological: Positive for weakness. Negative for sensory change.  Psychiatric/Behavioral: Negative.  The patient is not nervous/anxious.     As per HPI. Otherwise, a complete review of systems is negative.  PAST MEDICAL HISTORY: Past Medical History:  Diagnosis Date  . COPD (chronic obstructive pulmonary disease) ([place])   . GU (gastric peptic ulcer)   . Lung cancer ([place])   . Malignant pleural effusion   . PAF (paroxysmal atrial fibrillation) ([place]) 10/2016   a. 10/2016 post thoracentesis ; b. 10/2016 Echo: EF 65-70%, mild LVH;  c.  Amio/Eliquis initiated (CHA2DSVASc = 3).  . Pneumonia   . Wrist fracture 1993   bilateral    PAST SURGICAL HISTORY: Past Surgical History:  Procedure Laterality Date  . ABDOMINAL HYSTERECTOMY    . CATARACT EXTRACTION W/ INTRAOCULAR LENS  IMPLANT, BILATERAL    . CHOLECYSTECTOMY    . TONSILLECTOMY      FAMILY HISTORY: Family History  Problem Relation Age of Onset  . Hypertension Father   . Heart disease Father   . Heart attack Father   . Congestive Heart Failure Mother   . Stroke Mother   . Atrial fibrillation Sister     ADVANCED DIRECTIVES (Y/N):  N  HEALTH MAINTENANCE: Social History   Tobacco Use  . Smoking status: Former Smoker    Packs/day: 1.00    Years: 68.00    Pack years: 68.00    Last attempt to quit: 11/06/2016    Years since quitting: 0.2  . Smokeless tobacco: Never Used  Substance Use Topics  . Alcohol use: No  . Drug use: No     Colonoscopy:  PAP:  Bone density:  Lipid panel:  Allergies  Allergen Reactions  . Sulfa Antibiotics Other (See Comments)    Seizures   . Levaquin [Levofloxacin] Hives and Itching    Current Outpatient Medications  Medication Sig Dispense Refill  . albuterol (PROVENTIL) (2.5 MG/3ML) 0.083% nebulizer solution Take 3 mLs (2.5 mg total) by nebulization 2 (two) times daily. 75 mL 12  . amiodarone (PACERONE) 200 MG tablet Take by mouth.    Marland Kitchen apixaban (ELIQUIS) 5 MG TABS tablet Take 1 tablet (5 mg total)  by mouth 2 (two) times daily. 60 tablet 0  . cetirizine (ZYRTEC) 10 MG tablet Take 10 mg by mouth daily as needed for allergies.    . cholecalciferol (VITAMIN D) 1000 units tablet Take 1,000 Units by mouth daily.    Marland Kitchen diltiazem (CARDIZEM CD) 120 MG 24 hr capsule Take 1 capsule (120 mg total) by mouth daily.    . furosemide (LASIX) 20 MG tablet Take 1 tablet by mouth daily as needed. For EDEMA  3  . HYDROcodone-acetaminophen (NORCO) 5-325 MG tablet Take 1 tablet by mouth every 4 (four) hours as needed for moderate pain. 20  tablet 0  . lidocaine-prilocaine (EMLA) cream Apply to affected area once 30 g 3  . Multiple Vitamins-Minerals (MULTIVITAMIN ADULT PO) Take by mouth.    . nystatin-triamcinolone ointment (MYCOLOG) APPLY TO AFFECTED AREA TWICE A DAY    . omeprazole (PRILOSEC) 20 MG capsule Take 20 mg by mouth daily.    . ondansetron (ZOFRAN) 8 MG tablet Take 1 tablet (8 mg total) by mouth 2 (two) times daily as needed for refractory nausea / vomiting. 60 tablet 2  . potassium chloride SA (K-DUR,KLOR-CON) 20 MEQ tablet Take 20 mEq 2 (two) times daily by mouth.    . prochlorperazine (COMPAZINE) 10 MG tablet Take 1 tablet (10 mg total) by mouth every 6 (six) hours as needed (Nausea or vomiting). 60 tablet 2  . protein supplement shake (PREMIER PROTEIN) LIQD Take 325 mLs (11 oz total) by mouth 2 (two) times daily between meals. 60 Can 0   No current facility-administered medications for this visit.     OBJECTIVE: Vitals:   01/25/17 0956  BP: 114/66  Pulse: 72  Resp: 20  Temp: (!) 97.5 F (36.4 C)     Body mass index is 28.17 kg/m.    ECOG FS:1 - Symptomatic but completely ambulatory  General: Well-developed, well-nourished, no acute distress. Eyes: Pink conjunctiva, anicteric sclera. Lungs: Clear to auscultation bilaterally. Heart: Regular rate and rhythm. No rubs, murmurs, or gallops. Abdomen: Soft, nontender, nondistended. No organomegaly noted, normoactive bowel sounds. Musculoskeletal: No edema, cyanosis, or clubbing. Left arm in cast. Neuro: Alert, answering all questions appropriately. Cranial nerves grossly intact. Skin: Maculopapular rash noted, Improved. Psych: Normal affect.    LAB RESULTS:  Lab Results  Component Value Date   NA 134 (L) 01/25/2017   K 3.3 (L) 01/25/2017   CL 98 (L) 01/25/2017   CO2 28 01/25/2017   GLUCOSE 104 (H) 01/25/2017   BUN 12 01/25/2017   CREATININE 0.86 01/25/2017   CALCIUM 8.3 (L) 01/25/2017   PROT 6.2 (L) 01/25/2017   ALBUMIN 2.6 (L) 01/25/2017   AST  26 01/25/2017   ALT 16 01/25/2017   ALKPHOS 103 01/25/2017   BILITOT 0.5 01/25/2017   GFRNONAA >60 01/25/2017   GFRAA >60 01/25/2017    Lab Results  Component Value Date   WBC 4.2 01/25/2017   NEUTROABS 2.9 01/25/2017   HGB 9.3 (L) 01/25/2017   HCT 27.9 (L) 01/25/2017   MCV 82.7 01/25/2017   PLT 179 01/25/2017     STUDIES: Dg Chest 2 View  Result Date: 01/15/2017 CLINICAL DATA:  Shortness of breath. Known lung malignancy. Removal of pleural drainage catheter 13 days ago. Former smoker. EXAM: CHEST  2 VIEW COMPARISON:  Chest x-ray of December 31, 2016 FINDINGS: The right lung is well-expanded and clear. On the left there is base small amount of pleural fluid and moderate amount of pleural thickening noted inferiorly. The volume of  fluid is not greatly changed since the previous study. Patchy parenchymal density is present in the lingula and left lower lobe and this also is stable. The heart is normal in size. The pulmonary vascularity is not engorged. The power port catheter tip projects over the midportion of the SVC. There is calcification in the wall of the thoracic aorta. The bony thorax exhibits no acute abnormality. IMPRESSION: Fairly stable appearance of the chest with persistently increased pleuroparenchymal density in the lower 1/2 of the left lung. No pneumothorax nor large pleural effusion. Underlying COPD. Thoracic aortic atherosclerosis. Electronically Signed   By: David  Martinique M.D.   On: 01/15/2017 14:59   Dg Chest 2 View  Result Date: 12/31/2016 CLINICAL DATA:  83 y/o F; shortness of breath. History of lung cancer. EXAM: CHEST  2 VIEW COMPARISON:  12/26/2016 chest radiographs FINDINGS: Left pleural drain in situ. Left port catheter tip projects over the upper SVC. Increase left-sided pleural effusion. Left hemithorax pleural nodularity also appears increased from prior chest radiograph although this may be projectional due to patient rotation. New opacity in the left mid  lung zone may represent a focus of pneumonitis. Mildly enlarged cardiac silhouette. Mild reverse S curvature of the thoracic spine. Right upper quadrant cholecystectomy clips. IMPRESSION: 1. Increased left effusion. Apparent increase in pleural nodularity, possibly due to differences in projection. 2. New opacity in left mid lung zone may represent a focus of pneumonitis. 3. Stable position of left pleural drain and port catheter. Electronically Signed   By: Kristine Garbe M.D.   On: 12/31/2016 17:11   Dg Wrist Complete Left  Result Date: 01/02/2017 CLINICAL DATA:  Fall with bruising EXAM: LEFT WRIST - COMPLETE 3+ VIEW COMPARISON:  None. FINDINGS: Diffuse osteopenia. Comminuted, impacted intra-articular distal radius fracture. About 1/2 shaft diameter of dorsal displacement of distal fracture fragment and moderate dorsal angulation. Comminuted, impacted distal radius fracture with mild dorsal angulation. IMPRESSION: 1. Osteopenia 2. Comminuted, impacted intra-articular distal radius fracture with displacement and angulation. 3. Comminuted, slightly angulated distal ulna fracture. Electronically Signed   By: Donavan Foil M.D.   On: 01/02/2017 14:32   Ct Head Wo Contrast  Result Date: 01/02/2017 CLINICAL DATA:  Recent trip and fall with head injury, no neuro deficit, initial encounter EXAM: CT HEAD WITHOUT CONTRAST TECHNIQUE: Contiguous axial images were obtained from the base of the skull through the vertex without intravenous contrast. COMPARISON:  None. FINDINGS: Brain: Mild atrophic changes are noted. No findings to suggest acute hemorrhage, acute infarction or space-occupying mass lesion are noted. Vascular: No hyperdense vessel or unexpected calcification. Skull: Normal. Negative for fracture or focal lesion. Sinuses/Orbits: No acute finding. Other: None. IMPRESSION: Mild atrophic changes without acute intracranial abnormality. Electronically Signed   By: Inez Catalina M.D.   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 Patient refused an MRI of the brain, but did have a head CT on January 02, 2017 that did not report metastatic disease. Treatment was delayed secondary to her broken arm.  Given her advanced age will dose reduce carboplatinum and give weekly Taxol. Patient will receive carboplatinum AUC 5 on day 1 and Taxol 80 mg/m on days 1, 8, and 15. This will be a 28 day cycle. Patient has indicated that if her quality of life is effected negatively or side effects  are intolerable, she would likely discontinue treatment. Her PDL-1 level is 90%, therefore immunotherapy may be of benefit in the future. Hospice was previsouly discussed, but the patient is not ready for this at this time. Proceed with cycle 2, day 8 of carboplatinum and Taxol today.  Taxol only today.  Return to clinic in 1 week for consideration of cycle 2, day 15 which is Taxol only. 2. Broken arm: Continue follow-up with orthopedics as scheduled.   3. Pain: Patient does not complaints today.  Continue current narcotic regimen. 4. Rash: Resolved. 5. Hyponatremia: Mid. 6. Hypokalemia: Continue oral potassium supplementation.   7.  Anemia: Mild, monitor.  Patient expressed understanding and was in agreement with this plan. She also understands that She can call clinic at any time with any questions, concerns, or complaints.   Cancer Staging Squamous cell carcinoma lung, left ([place]) Staging form: Lung, AJCC 8th Edition - Clinical stage from 12/13/2016: Stage IVA (cT4, cN0, cM1a) - Signed by [name], MD on 12/13/2016   [name], MD   01/26/2017 9:42 AM

## 2017-01-25 ENCOUNTER — Inpatient Hospital Stay: Payer: Medicare HMO

## 2017-01-25 ENCOUNTER — Inpatient Hospital Stay (HOSPITAL_BASED_OUTPATIENT_CLINIC_OR_DEPARTMENT_OTHER): Payer: Medicare HMO | Admitting: Oncology

## 2017-01-25 ENCOUNTER — Encounter: Payer: Self-pay | Admitting: Oncology

## 2017-01-25 ENCOUNTER — Other Ambulatory Visit: Payer: Self-pay

## 2017-01-25 VITALS — BP 114/66 | HR 72 | Temp 97.5°F | Resp 20 | Wt 159.0 lb

## 2017-01-25 DIAGNOSIS — I480 Paroxysmal atrial fibrillation: Secondary | ICD-10-CM

## 2017-01-25 DIAGNOSIS — Z5111 Encounter for antineoplastic chemotherapy: Secondary | ICD-10-CM | POA: Diagnosis not present

## 2017-01-25 DIAGNOSIS — E871 Hypo-osmolality and hyponatremia: Secondary | ICD-10-CM | POA: Diagnosis not present

## 2017-01-25 DIAGNOSIS — J910 Malignant pleural effusion: Secondary | ICD-10-CM | POA: Diagnosis not present

## 2017-01-25 DIAGNOSIS — Z79899 Other long term (current) drug therapy: Secondary | ICD-10-CM

## 2017-01-25 DIAGNOSIS — Z8781 Personal history of (healed) traumatic fracture: Secondary | ICD-10-CM | POA: Diagnosis not present

## 2017-01-25 DIAGNOSIS — J449 Chronic obstructive pulmonary disease, unspecified: Secondary | ICD-10-CM | POA: Diagnosis not present

## 2017-01-25 DIAGNOSIS — E876 Hypokalemia: Secondary | ICD-10-CM | POA: Diagnosis not present

## 2017-01-25 DIAGNOSIS — Z8711 Personal history of peptic ulcer disease: Secondary | ICD-10-CM | POA: Diagnosis not present

## 2017-01-25 DIAGNOSIS — Z87891 Personal history of nicotine dependence: Secondary | ICD-10-CM

## 2017-01-25 DIAGNOSIS — Z8701 Personal history of pneumonia (recurrent): Secondary | ICD-10-CM | POA: Diagnosis not present

## 2017-01-25 DIAGNOSIS — C3492 Malignant neoplasm of unspecified part of left bronchus or lung: Secondary | ICD-10-CM

## 2017-01-25 DIAGNOSIS — D649 Anemia, unspecified: Secondary | ICD-10-CM

## 2017-01-25 DIAGNOSIS — Z7901 Long term (current) use of anticoagulants: Secondary | ICD-10-CM

## 2017-01-25 LAB — CBC WITH DIFFERENTIAL/PLATELET
BASOS ABS: 0 10*3/uL (ref 0–0.1)
BASOS PCT: 1 %
EOS ABS: 0.2 10*3/uL (ref 0–0.7)
EOS PCT: 5 %
HCT: 27.9 % — ABNORMAL LOW (ref 35.0–47.0)
Hemoglobin: 9.3 g/dL — ABNORMAL LOW (ref 12.0–16.0)
LYMPHS PCT: 22 %
Lymphs Abs: 0.9 10*3/uL — ABNORMAL LOW (ref 1.0–3.6)
MCH: 27.6 pg (ref 26.0–34.0)
MCHC: 33.4 g/dL (ref 32.0–36.0)
MCV: 82.7 fL (ref 80.0–100.0)
Monocytes Absolute: 0.1 10*3/uL — ABNORMAL LOW (ref 0.2–0.9)
Monocytes Relative: 3 %
Neutro Abs: 2.9 10*3/uL (ref 1.4–6.5)
Neutrophils Relative %: 69 %
PLATELETS: 179 10*3/uL (ref 150–440)
RBC: 3.37 MIL/uL — AB (ref 3.80–5.20)
RDW: 16.6 % — AB (ref 11.5–14.5)
WBC: 4.2 10*3/uL (ref 3.6–11.0)

## 2017-01-25 LAB — COMPREHENSIVE METABOLIC PANEL
ALK PHOS: 103 U/L (ref 38–126)
ALT: 16 U/L (ref 14–54)
AST: 26 U/L (ref 15–41)
Albumin: 2.6 g/dL — ABNORMAL LOW (ref 3.5–5.0)
Anion gap: 8 (ref 5–15)
BILIRUBIN TOTAL: 0.5 mg/dL (ref 0.3–1.2)
BUN: 12 mg/dL (ref 6–20)
CALCIUM: 8.3 mg/dL — AB (ref 8.9–10.3)
CO2: 28 mmol/L (ref 22–32)
CREATININE: 0.86 mg/dL (ref 0.44–1.00)
Chloride: 98 mmol/L — ABNORMAL LOW (ref 101–111)
GFR calc Af Amer: >60 mL/min (ref >60)
Glucose, Bld: 104 mg/dL — ABNORMAL HIGH (ref 65–99)
POTASSIUM: 3.3 mmol/L — AB (ref 3.5–5.1)
Sodium: 134 mmol/L — ABNORMAL LOW (ref 135–145)
TOTAL PROTEIN: 6.2 g/dL — AB (ref 6.5–8.1)

## 2017-01-25 MED ORDER — SODIUM CHLORIDE 0.9% FLUSH
10.0000 mL | INTRAVENOUS | Status: DC | PRN
  Administered 2017-01-25: 10 mL via INTRAVENOUS
  Filled 2017-01-25: qty 10

## 2017-01-25 MED ORDER — DIPHENHYDRAMINE HCL 50 MG/ML IJ SOLN
25.0000 mg | Freq: Once | INTRAMUSCULAR | Status: AC
  Administered 2017-01-25: 25 mg via INTRAVENOUS
  Filled 2017-01-25: qty 1

## 2017-01-25 MED ORDER — SODIUM CHLORIDE 0.9 % IV SOLN
Freq: Once | INTRAVENOUS | Status: AC
Start: 2017-01-25 — End: 2017-01-25
  Administered 2017-01-25: 11:00:00 via INTRAVENOUS
  Filled 2017-01-25: qty 1000

## 2017-01-25 MED ORDER — DEXAMETHASONE SODIUM PHOSPHATE 10 MG/ML IJ SOLN
10.0000 mg | Freq: Once | INTRAMUSCULAR | Status: AC
  Administered 2017-01-25: 10 mg via INTRAVENOUS
  Filled 2017-01-25: qty 1

## 2017-01-25 MED ORDER — PACLITAXEL CHEMO INJECTION 300 MG/50ML
80.0000 mg/m2 | Freq: Once | INTRAVENOUS | Status: AC
  Administered 2017-01-25: 150 mg via INTRAVENOUS
  Filled 2017-01-25: qty 25

## 2017-01-25 MED ORDER — HEPARIN SOD (PORK) LOCK FLUSH 100 UNIT/ML IV SOLN
500.0000 [IU] | Freq: Once | INTRAVENOUS | Status: AC
  Administered 2017-01-25: 500 [IU] via INTRAVENOUS
  Filled 2017-01-25: qty 5

## 2017-01-25 MED ORDER — FAMOTIDINE IN NACL 20-0.9 MG/50ML-% IV SOLN
20.0000 mg | Freq: Once | INTRAVENOUS | Status: AC
Start: 2017-01-25 — End: 2017-01-25
  Administered 2017-01-25: 20 mg via INTRAVENOUS
  Filled 2017-01-25: qty 50

## 2017-01-25 MED ORDER — SODIUM CHLORIDE 0.9 % IV SOLN: 10.0000 mg | Freq: Once | INTRAVENOUS | Status: DC

## 2017-01-25 NOTE — Progress Notes (Signed)
Patient denies any concerns today.  

## 2017-01-25 NOTE — Progress Notes (Signed)
Nutrition Follow-up:  Patient with lung cancer and followed by Dr. Grayland Ormond.  Met with patient and daughter following MD appointment today.  Patient reports that she is now home from [employer] and grand-daughter is preparing meals for her.  She reports that she feels stronger.  Reports that she ate liver pudding with eggs this and is planning to eat fruit and cottage cheese for lunch today.  Reports last night at beef roast with potatoes, carrots and broccoli for dinner.  Reports that some foods taste really good to her and others do not.  "Bread taste like cardboard."  Does not really like boost/ensure/carnation instant breakfast premier protein shakes.  Has found a liquid yogurt drink that taste good to her and that she likes (100 calories and 18 g of protein).     Medications: reviewed  Labs: reviewed  Anthropometrics:   Weight decreased today to 159 lb from 162 lb 4.8 oz on 11/1   NUTRITION DIAGNOSIS: Inadequate oral intake improving   MALNUTRITION DIAGNOSIS: continue to monitor   INTERVENTION:   Encouraged patient to continue to eat small frequent meals Reviewed strategies to increase calories and protein.    MONITORING, EVALUATION, GOAL: weight trends, intake   NEXT VISIT:  As needed  [name], [place], [place] Registered Dietitian [phone] (pager)

## 2017-01-28 ENCOUNTER — Telehealth: Payer: Self-pay | Admitting: Oncology

## 2017-01-28 NOTE — Telephone Encounter (Signed)
Called patient back. She wants her daughter in law to talk to me as her hearing is not good.  Reports to have sore throat. Yesterday she feels weak, today feels better. Last chemotherapy was 11/8.  There is no fever or chills. Discussed that dry air could cause sore throat, however given her age, and immunocompromised state,advice patient to go to Urgent care and have her throat checked.

## 2017-01-30 ENCOUNTER — Inpatient Hospital Stay (HOSPITAL_BASED_OUTPATIENT_CLINIC_OR_DEPARTMENT_OTHER): Payer: Medicare HMO | Admitting: Oncology

## 2017-01-30 ENCOUNTER — Other Ambulatory Visit: Payer: Self-pay | Admitting: *Deleted

## 2017-01-30 ENCOUNTER — Telehealth: Payer: Self-pay | Admitting: *Deleted

## 2017-01-30 ENCOUNTER — Encounter: Payer: Self-pay | Admitting: Oncology

## 2017-01-30 ENCOUNTER — Other Ambulatory Visit: Payer: Self-pay

## 2017-01-30 ENCOUNTER — Inpatient Hospital Stay: Payer: Medicare HMO

## 2017-01-30 VITALS — BP 119/66 | HR 76 | Temp 98.4°F | Resp 20

## 2017-01-30 DIAGNOSIS — J910 Malignant pleural effusion: Secondary | ICD-10-CM | POA: Diagnosis not present

## 2017-01-30 DIAGNOSIS — E871 Hypo-osmolality and hyponatremia: Secondary | ICD-10-CM | POA: Diagnosis not present

## 2017-01-30 DIAGNOSIS — Z8711 Personal history of peptic ulcer disease: Secondary | ICD-10-CM

## 2017-01-30 DIAGNOSIS — J449 Chronic obstructive pulmonary disease, unspecified: Secondary | ICD-10-CM | POA: Diagnosis not present

## 2017-01-30 DIAGNOSIS — D860 Sarcoidosis of lung: Secondary | ICD-10-CM | POA: Diagnosis not present

## 2017-01-30 DIAGNOSIS — Z79899 Other long term (current) drug therapy: Secondary | ICD-10-CM

## 2017-01-30 DIAGNOSIS — D649 Anemia, unspecified: Secondary | ICD-10-CM | POA: Diagnosis not present

## 2017-01-30 DIAGNOSIS — Z8781 Personal history of (healed) traumatic fracture: Secondary | ICD-10-CM

## 2017-01-30 DIAGNOSIS — Z5189 Encounter for other specified aftercare: Secondary | ICD-10-CM

## 2017-01-30 DIAGNOSIS — R110 Nausea: Secondary | ICD-10-CM

## 2017-01-30 DIAGNOSIS — I480 Paroxysmal atrial fibrillation: Secondary | ICD-10-CM | POA: Diagnosis not present

## 2017-01-30 DIAGNOSIS — Z8701 Personal history of pneumonia (recurrent): Secondary | ICD-10-CM

## 2017-01-30 DIAGNOSIS — C3492 Malignant neoplasm of unspecified part of left bronchus or lung: Secondary | ICD-10-CM

## 2017-01-30 DIAGNOSIS — R197 Diarrhea, unspecified: Secondary | ICD-10-CM

## 2017-01-30 DIAGNOSIS — E860 Dehydration: Secondary | ICD-10-CM

## 2017-01-30 DIAGNOSIS — E876 Hypokalemia: Secondary | ICD-10-CM

## 2017-01-30 DIAGNOSIS — Z7901 Long term (current) use of anticoagulants: Secondary | ICD-10-CM

## 2017-01-30 DIAGNOSIS — Z5111 Encounter for antineoplastic chemotherapy: Secondary | ICD-10-CM | POA: Diagnosis not present

## 2017-01-30 DIAGNOSIS — Z87891 Personal history of nicotine dependence: Secondary | ICD-10-CM

## 2017-01-30 LAB — COMPREHENSIVE METABOLIC PANEL
ALBUMIN: 2.5 g/dL — AB (ref 3.5–5.0)
ALT: 24 U/L (ref 14–54)
AST: 23 U/L (ref 15–41)
Alkaline Phosphatase: 93 U/L (ref 38–126)
Anion gap: 9 (ref 5–15)
BILIRUBIN TOTAL: 0.6 mg/dL (ref 0.3–1.2)
BUN: 11 mg/dL (ref 6–20)
CHLORIDE: 99 mmol/L — AB (ref 101–111)
CO2: 25 mmol/L (ref 22–32)
CREATININE: 0.69 mg/dL (ref 0.44–1.00)
Calcium: 8.3 mg/dL — ABNORMAL LOW (ref 8.9–10.3)
GFR calc Af Amer: >60 mL/min (ref >60)
GFR calc non Af Amer: >60 mL/min (ref >60)
GLUCOSE: 101 mg/dL — AB (ref 65–99)
POTASSIUM: 3.9 mmol/L (ref 3.5–5.1)
Sodium: 133 mmol/L — ABNORMAL LOW (ref 135–145)
Total Protein: 5.9 g/dL — ABNORMAL LOW (ref 6.5–8.1)

## 2017-01-30 LAB — CBC WITH DIFFERENTIAL/PLATELET
BASOS ABS: 0 10*3/uL (ref 0–0.1)
BASOS PCT: 1 %
Eosinophils Absolute: 0 10*3/uL (ref 0–0.7)
Eosinophils Relative: 5 %
HCT: 22.2 % — ABNORMAL LOW (ref 35.0–47.0)
HEMOGLOBIN: 7.4 g/dL — AB (ref 12.0–16.0)
LYMPHS ABS: 0.7 10*3/uL — AB (ref 1.0–3.6)
LYMPHS PCT: 78 %
MCH: 27.7 pg (ref 26.0–34.0)
MCHC: 33.3 g/dL (ref 32.0–36.0)
MCV: 83.4 fL (ref 80.0–100.0)
MONO ABS: 0 10*3/uL — AB (ref 0.2–0.9)
Monocytes Relative: 2 %
Neutro Abs: 0.1 10*3/uL — ABNORMAL LOW (ref 1.4–6.5)
Neutrophils Relative %: 14 %
Platelets: 88 10*3/uL — ABNORMAL LOW (ref 150–440)
RBC: 2.66 MIL/uL — AB (ref 3.80–5.20)
RDW: 16.5 % — AB (ref 11.5–14.5)
WBC: 0.9 10*3/uL — CL (ref 3.6–11.0)

## 2017-01-30 MED ORDER — DIPHENOXYLATE-ATROPINE 2.5-0.025 MG PO TABS: 1.0000 | ORAL_TABLET | Freq: Four times a day (QID) | ORAL | 0 refills | Status: DC | PRN

## 2017-01-30 MED ORDER — PROMETHAZINE HCL 25 MG PO TABS
12.5000 mg | ORAL_TABLET | Freq: Once | ORAL | Status: AC
  Administered 2017-01-30: 12.5 mg via ORAL
  Filled 2017-01-30: qty 1

## 2017-01-30 MED ORDER — SODIUM CHLORIDE 0.9 % IV SOLN
Freq: Once | INTRAVENOUS | Status: AC
  Administered 2017-01-30: 15:00:00 via INTRAVENOUS
  Filled 2017-01-30: qty 1000

## 2017-01-30 MED ORDER — DIPHENOXYLATE-ATROPINE 2.5-0.025 MG PO TABS
1.0000 | ORAL_TABLET | Freq: Once | ORAL | Status: AC
  Administered 2017-01-30: 1 via ORAL
  Filled 2017-01-30: qty 1

## 2017-01-30 NOTE — Progress Notes (Signed)
Patient here today as acute add on, patient reports diarrhea x 3 days.

## 2017-01-30 NOTE — Telephone Encounter (Signed)
Patient agrees to 130 appointment today

## 2017-01-30 NOTE — Telephone Encounter (Signed)
Patient called to report that she has had watery diarrhea since Saturday. She states she went at least 30 times Saturday and goes no less than 5 -6 times a day since then. She has tried Imodium with no results. She is afraid she is getting dehydrated. She is asking for different medicine for diarrhea. I spoke with her regarding her diet and she has been eating things like fresh strawberries and sweet and sour pork. I educated her on the BRAT diet and eating bland foods. She reports that she is trying to drink plenty of fluids. Please advise

## 2017-01-30 NOTE — Telephone Encounter (Signed)
Per Dr Grayland Ormond patient to see NP

## 2017-01-30 NOTE — Progress Notes (Signed)
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 Patient refused an MRI of the brain, but did have a head CT on January 02, 2017 that did not report metastatic disease. Treatment was delayed secondary to her broken arm.  Given her advanced age will dose reduce carboplatinum and give weekly Taxol. Patient will receive carboplatinum AUC 5 on day 1 and Taxol 80 mg/m on days 1, 8, and 15. This will be a 28 day cycle. Patient has indicated that if her quality of life is effected negatively or side effects are intolerable, she would likely discontinue treatment. Her PDL-1 level is 90%, therefore immunotherapy may be of benefit in the future. Hospice was previsouly discussed, but the patient is not ready for this at this time  Interval history- Patient was last seen by Dr. Kennith Gain on 01/25/2017 for hospital follow-up and consideration of further treatment of carbo and Taxol. Taxol was only given. Her weakness and fatigue have improved and she was nearly back to baseline. Her appetite was fair and she denied weight loss. She denied pain but continued to follow-up with orthopedics for broken arm.   Patient presents for diarrhea X 4 days and intermittent nausea.   Diarrhea: Patient complains of diarrhea. Onset of diarrhea was 4 days ago. Diarrhea is occurring approximately 5 times per day. Saturday she has greater then 30 episodes. Patient describes diarrhea as green/yellow, watery and fowl smelling. Diarrhea has  been associated with chemotherapy. Patient denies blood in stool, fever, illness in household contacts, recent antibiotic use, significant abdominal pain.  Previous visits for diarrhea: none. Evaluation to date: CBC and electrolytes, BUN and creatinine. Treatment to date: Imodium with no relief. She is occasionally incontinent.  Nausea: Patient complains of nausea. Onset of nausea was 4 days ago. Nausea is occurring intermittently throughout the day. Nausea has been associated with recent chemotherapy. Patient denies vomiting. Has no previous visits for nausea. No evaluation to date. Treatment to date: Zofran and Compazine with some relief.   ECOG FS:1 - Symptomatic but completely ambulatory  Review of systems- Review of Systems  Constitutional: Positive for malaise/fatigue. Negative for chills and fever.  HENT: Negative.   Eyes: Negative.   Respiratory: Negative.   Cardiovascular: Negative.  Negative for chest pain.  Gastrointestinal: Positive for diarrhea and nausea. Negative for blood in stool, constipation and vomiting.  Genitourinary: Negative.   Musculoskeletal: Negative.   Skin: Negative.   Neurological: Positive for weakness. Negative for dizziness and headaches.  Endo/Heme/Allergies: Negative.   Psychiatric/Behavioral: Negative.      Current treatment- Carbo/Taxol Cycle 2, Day 8  Allergies  Allergen Reactions  . Sulfa Antibiotics Other (See Comments)    Seizures   . Levaquin [Levofloxacin] Hives and Itching     Past Medical History:  Diagnosis Date  . COPD (chronic obstructive pulmonary disease) ([place])   . GU (gastric peptic ulcer)   . Lung cancer ([place])   . Malignant pleural effusion   . PAF (paroxysmal atrial fibrillation) ([place]) 10/2016   a. 10/2016 post thoracentesis ;  b. 10/2016 Echo: EF 65-70%, mild LVH;  c. Amio/Eliquis initiated (CHA2DSVASc = 3).  . Pneumonia   . Wrist fracture 1993   bilateral     Past Surgical History:  Procedure Laterality Date  .  ABDOMINAL HYSTERECTOMY    . CATARACT EXTRACTION W/ INTRAOCULAR LENS  IMPLANT, BILATERAL    . CHEST TUBE INSERTION Left 12/18/2016   Procedure: CHEST TUBE INSERTION;  Surgeon: [name], MD;  Location: [facility] ORS;  Service: General;  Laterality: Left;  . CHOLECYSTECTOMY    . CLOSED REDUCTION WRIST FRACTURE Left 01/04/2017   Procedure: CLOSED REDUCTION WRIST;  Surgeon: Thornton Park, MD;  Location: [facility] ORS;  Service: Orthopedics;  Laterality: Left;  . PORTACATH PLACEMENT Left 12/18/2016   Procedure: INSERTION PORT-A-CATH;  Surgeon: [name], MD;  Location: [facility] ORS;  Service: General;  Laterality: Left;  . REMOVAL OF PLEURAL DRAINAGE CATHETER N/A 01/02/2017   Procedure: REMOVAL OF PLEURAL DRAINAGE CATHETER;  Surgeon: [name], MD;  Location: [facility] ORS;  Service: Thoracic;  Laterality: N/A;  . TONSILLECTOMY      Social History   Socioeconomic History  . Marital status: Widowed    Spouse name: Not on file  . Number of children: Not on file  . Years of education: Not on file  . Highest education level: Not on file  Social Needs  . Financial resource strain: Not on file  . Food insecurity - worry: Not on file  . Food insecurity - inability: Not on file  . Transportation needs - medical: Not on file  . Transportation needs - non-medical: Not on file  Occupational History  . Not on file  Tobacco Use  . Smoking status: Former Smoker    Packs/day: 1.00    Years: 68.00    Pack years: 68.00    Last attempt to quit: 11/06/2016    Years since quitting: 0.2  . Smokeless tobacco: Never Used  Substance and Sexual Activity  . Alcohol use: No  . Drug use: No  . Sexual activity: No  Other Topics Concern  . Not on file  Social History Narrative  . Not on file    Family History  Problem Relation Age of Onset  . Hypertension Father   . Heart disease Father   . Heart attack Father   . Congestive Heart Failure Mother   . Stroke Mother   . Atrial fibrillation Sister       Current Outpatient Medications:  .  albuterol (PROVENTIL) (2.5 MG/3ML) 0.083% nebulizer solution, Take 3 mLs (2.5 mg total) by nebulization 2 (two) times daily., Disp: 75 mL, Rfl: 12 .  amiodarone (PACERONE) 200 MG tablet, Take 200 mg daily by mouth. , Disp: , Rfl:  .  apixaban (ELIQUIS) 5 MG TABS tablet, Take 1 tablet (5 mg total) by mouth 2 (two) times daily., Disp: 60 tablet, Rfl: 0 .  cetirizine (ZYRTEC) 10 MG tablet, Take 10 mg by mouth daily as needed for allergies., Disp: , Rfl:  .  cholecalciferol (VITAMIN D) 1000 units tablet, Take 1,000 Units by mouth daily., Disp: , Rfl:  .  diltiazem (CARDIZEM CD) 120 MG 24 hr capsule, Take 1 capsule (120 mg total) by mouth daily., Disp: , Rfl:  .  diphenoxylate-atropine (LOMOTIL) 2.5-0.025 MG tablet, Take 1 tablet 4 (four) times daily as needed by mouth for diarrhea or loose stools., Disp: 30 tablet, Rfl: 0 .  furosemide (LASIX) 20 MG tablet, Take 1 tablet by mouth daily as needed. For EDEMA, Disp: , Rfl:  3 .  HYDROcodone-acetaminophen (NORCO) 5-325 MG tablet, Take 1 tablet by mouth every 4 (four) hours as needed for moderate pain., Disp: 20 tablet, Rfl: 0 .  lidocaine-prilocaine (EMLA) cream, Apply to affected area once, Disp: 30 g, Rfl: 3 .  Multiple Vitamins-Minerals (MULTIVITAMIN ADULT PO), Take by mouth., Disp: , Rfl:  .  nystatin-triamcinolone ointment (MYCOLOG), APPLY TO AFFECTED AREA TWICE A DAY, Disp: , Rfl:  .  omeprazole (PRILOSEC) 20 MG capsule, Take 20 mg by mouth daily., Disp: , Rfl:  .  ondansetron (ZOFRAN) 8 MG tablet, Take 1 tablet (8 mg total) by mouth 2 (two) times daily as needed for refractory nausea / vomiting., Disp: 60 tablet, Rfl: 2 .  potassium chloride SA (K-DUR,KLOR-CON) 20 MEQ tablet, Take 20 mEq 2 (two) times daily by mouth., Disp: , Rfl:  .  prochlorperazine (COMPAZINE) 10 MG tablet, Take 1 tablet (10 mg total) by mouth every 6 (six) hours as needed (Nausea or vomiting)., Disp: 60 tablet, Rfl: 2 .  protein  supplement shake (PREMIER PROTEIN) LIQD, Take 325 mLs (11 oz total) by mouth 2 (two) times daily between meals., Disp: 60 Can, Rfl: 0  Physical exam:  Vitals:   01/30/17 1644  BP: 119/66  Pulse: 76  Resp: 20  Temp: 98.4 F (36.9 C)  TempSrc: Tympanic   Physical Exam  Constitutional: She is oriented to person, place, and time and well-developed, well-nourished, and in no distress. She appears dehydrated.  HENT:  Head: Normocephalic and atraumatic.  Eyes: Conjunctivae are normal. Pupils are equal, round, and reactive to light.  Neck: Normal range of motion.  Cardiovascular: Normal rate, regular rhythm and normal heart sounds.  Pulmonary/Chest: Effort normal and breath sounds normal.  Abdominal: Soft.  Musculoskeletal: Normal range of motion.  Neurological: She is alert and oriented to person, place, and time.  Skin: Skin is warm, dry and intact. There is pallor.     CMP Latest Ref Rng & Units 02/01/2017  Glucose 65 - 99 mg/dL 118(H)  BUN 6 - 20 mg/dL 9  Creatinine 0.44 - 1.00 mg/dL 0.76  Sodium 135 - 145 mmol/L 133(L)  Potassium 3.5 - 5.1 mmol/L 3.4(L)  Chloride 101 - 111 mmol/L 101  CO2 22 - 32 mmol/L 24  Calcium 8.9 - 10.3 mg/dL 8.2(L)  Total Protein 6.5 - 8.1 g/dL 5.7(L)  Total Bilirubin 0.3 - 1.2 mg/dL 0.6  Alkaline Phos 38 - 126 U/L 91  AST 15 - 41 U/L 16  ALT 14 - 54 U/L 20   CBC Latest Ref Rng & Units 02/01/2017  WBC 3.6 - 11.0 K/uL 1.3(LL)  Hemoglobin 12.0 - 16.0 g/dL 7.1(L)  Hematocrit 35.0 - 47.0 % 21.1(L)  Platelets 150 - 440 K/uL 76(L)    No images are attached to the encounter.  Dg Chest 2 View  Result Date: 01/15/2017 CLINICAL DATA:  Shortness of breath. Known lung malignancy. Removal of pleural drainage catheter 13 days ago. Former smoker. EXAM: CHEST  2 VIEW COMPARISON:  Chest x-ray of December 31, 2016 FINDINGS: The right lung is well-expanded and clear. On the left there is base small amount of pleural fluid and moderate amount of pleural thickening  noted inferiorly. The volume of fluid is not greatly changed since the previous study. Patchy parenchymal density is present in the lingula and left lower lobe and this also is stable. The heart is normal in size. The pulmonary vascularity is not engorged. The power port catheter tip projects over the midportion of the SVC.  There is calcification in the wall of the thoracic aorta. The bony thorax exhibits no acute abnormality. IMPRESSION: Fairly stable appearance of the chest with persistently increased pleuroparenchymal density in the lower 1/2 of the left lung. No pneumothorax nor large pleural effusion. Underlying COPD. Thoracic aortic atherosclerosis. Electronically Signed   By: David  Martinique M.D.   On: 01/15/2017 14:59   Dg Wrist Complete Left  Result Date: 01/02/2017 CLINICAL DATA:  Fall with bruising EXAM: LEFT WRIST - COMPLETE 3+ VIEW COMPARISON:  None. FINDINGS: Diffuse osteopenia. Comminuted, impacted intra-articular distal radius fracture. About 1/2 shaft diameter of dorsal displacement of distal fracture fragment and moderate dorsal angulation. Comminuted, impacted distal radius fracture with mild dorsal angulation. IMPRESSION: 1. Osteopenia 2. Comminuted, impacted intra-articular distal radius fracture with displacement and angulation. 3. Comminuted, slightly angulated distal ulna fracture. Electronically Signed   By: Donavan Foil M.D.   On: 01/02/2017 14:32   Ct Head Wo Contrast  Result Date: 01/02/2017 CLINICAL DATA:  Recent trip and fall with head injury, no neuro deficit, initial encounter EXAM: CT HEAD WITHOUT CONTRAST TECHNIQUE: Contiguous axial images were obtained from the base of the skull through the vertex without intravenous contrast. COMPARISON:  None. FINDINGS: Brain: Mild atrophic changes are noted. No findings to suggest acute hemorrhage, acute infarction or space-occupying mass lesion are noted. Vascular: No hyperdense vessel or unexpected calcification. Skull: Normal.  Negative for fracture or focal lesion. Sinuses/Orbits: No acute finding. Other: None. IMPRESSION: Mild atrophic changes without acute intracranial abnormality. Electronically Signed   By: Inez Catalina M.D.   On: 01/02/2017 14:18     Assessment and plan- Patient is a 83 y.o. female who presents with diarrhea X 4 days and intermittent nausea. On initial evaluation patient is sitting in wheelchair accompanied by granddaughter. She appears pale. Lungs are clear bilaterally. Hyperactive bowel sounds. Mucous membranes dry. Vital signs are normal. She is afebrile. Lab work revealed hyponatremia (133), neutropenia(0.9), anemia (7.4) and ANC (0.1). Will give 1 L normal saline, 8 mg IV Zofran, by mouth Lomotil and check stool for C. Difficile.  Final evaluation: Patient states "she feels much better" after receiving fluids, IV zofran and Lomotil. Patients color has returned and mucous membranes are moist. Vital signs remained stable. Patient had one episode of diarrhea during infusion. Stool sample negative for C. Difficile.   Final Diagnosis: Dehydration, nausea and diarrhea r/t recent chemotherapy. Non-infectious. Patient given prescription for Lomotil 2.5-0.025 tablets. She is to continue oral antiemetics at home. Return to clinic to see Dr. Grayland Ormond on Thursday for evaluation, labs and possible treatment. Patient is severely neutropenic. We discussed neutropenic precautions and how to protect herself.    Visit Diagnosis 1. Dehydration   2. Diarrhea, unspecified type   3. Nausea without vomiting     Patient expressed understanding and was in agreement with this plan. She also understands that She can call clinic at any time with any questions, concerns, or complaints.   Greater than 50% was spent in counseling and coordination of care with this patient including but not limited to discussion of the relevant topics above (See A&P) including, but not limited to diagnosis and management of acute and chronic  medical conditions.    Faythe Casa, AGNP-C [facility] [place]- [phone] Pager- [phone] 02/01/2017 1:39 PM

## 2017-01-31 ENCOUNTER — Other Ambulatory Visit: Payer: Self-pay

## 2017-01-31 ENCOUNTER — Telehealth: Payer: Self-pay | Admitting: *Deleted

## 2017-01-31 DIAGNOSIS — R197 Diarrhea, unspecified: Secondary | ICD-10-CM

## 2017-01-31 DIAGNOSIS — Z5111 Encounter for antineoplastic chemotherapy: Secondary | ICD-10-CM | POA: Diagnosis not present

## 2017-01-31 LAB — C DIFFICILE QUICK SCREEN W PCR REFLEX
C Diff antigen: NEGATIVE
C Diff interpretation: NOT DETECTED
C Diff toxin: NEGATIVE

## 2017-01-31 NOTE — Progress Notes (Signed)
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  INTERVAL HISTORY: Patient returns to clinic today for hospital follow-up and consideration of cycle 2, day 15 of carboplatinum and Taxol.  She continues to have diarrhea, but this has improved over the past 1-2 days.  She also has persistent weakness and fatigue and a decreased performance status.  She has a poor appetite. She has no neurologic complaints. She does not complain of rib or arm pain today. She has no chest pain, shortness of breath, cough, or hemoptysis. She denies any nausea, vomiting, constipation, or diarrhea. She has no urinary complaints. Patient offers no further specific complaints today.  REVIEW OF SYSTEMS:   Review of Systems  Constitutional: Positive for malaise/fatigue. Negative for fever and weight loss.  Respiratory: Negative.  Negative for cough and shortness of breath.   Cardiovascular: Negative.  Negative for chest pain and leg swelling.  Gastrointestinal: Positive for diarrhea. Negative for abdominal pain.  Genitourinary: Negative.   Musculoskeletal: Negative.  Negative for back pain, falls and joint pain.  Skin: Negative.  Negative for itching and rash.  Neurological: Positive for weakness. Negative for sensory change.  Psychiatric/Behavioral: Negative.  The patient is not nervous/anxious.     As per HPI. Otherwise, a complete review of systems is negative.  PAST MEDICAL HISTORY: Past Medical History:  Diagnosis Date  . COPD (chronic obstructive pulmonary disease) ([place])   . GU (gastric peptic ulcer)   . Lung cancer ([place])   . Malignant pleural effusion   . PAF (paroxysmal atrial fibrillation) ([place]) 10/2016   a. 10/2016 post  thoracentesis ; b. 10/2016 Echo: EF 65-70%, mild LVH;  c. Amio/Eliquis initiated (CHA2DSVASc = 3).  . Pneumonia   . Wrist fracture 1993   bilateral    PAST SURGICAL HISTORY: Past Surgical History:  Procedure Laterality Date  . ABDOMINAL HYSTERECTOMY    . CATARACT EXTRACTION W/ INTRAOCULAR LENS  IMPLANT, BILATERAL    . CHEST TUBE INSERTION Left 12/18/2016   Procedure: CHEST TUBE INSERTION;  Surgeon: [name], MD;  Location: [facility] ORS;  Service: General;  Laterality: Left;  . CHOLECYSTECTOMY    . CLOSED REDUCTION WRIST FRACTURE Left 01/04/2017   Procedure: CLOSED REDUCTION WRIST;  Surgeon: Thornton Park, MD;  Location: [facility] ORS;  Service: Orthopedics;  Laterality: Left;  . PORTACATH PLACEMENT Left 12/18/2016   Procedure: INSERTION PORT-A-CATH;  Surgeon: [name], MD;  Location: [facility] ORS;  Service: General;  Laterality: Left;  . REMOVAL OF PLEURAL DRAINAGE CATHETER N/A 01/02/2017   Procedure: REMOVAL OF PLEURAL DRAINAGE CATHETER;  Surgeon: [name], MD;  Location: [facility] ORS;  Service: Thoracic;  Laterality: N/A;  . TONSILLECTOMY      FAMILY HISTORY: Family History  Problem Relation Age of Onset  . Hypertension Father   . Heart disease Father   . Heart attack Father   . Congestive Heart Failure Mother   . Stroke Mother   . Atrial fibrillation Sister     ADVANCED DIRECTIVES (Y/N):  N  HEALTH MAINTENANCE: Social History   Tobacco Use  . Smoking status: Former Smoker    Packs/day: 1.00    Years: 68.00    Pack years: 68.00    Last attempt to quit: 11/06/2016    Years  since quitting: 0.2  . Smokeless tobacco: Never Used  Substance Use Topics  . Alcohol use: No  . Drug use: No     Colonoscopy:  PAP:  Bone density:  Lipid panel:  Allergies  Allergen Reactions  . Sulfa Antibiotics Other (See Comments)    Seizures   . Levaquin [Levofloxacin] Hives and Itching    Current Outpatient Medications  Medication Sig Dispense Refill  . albuterol (PROVENTIL)  (2.5 MG/3ML) 0.083% nebulizer solution Take 3 mLs (2.5 mg total) by nebulization 2 (two) times daily. 75 mL 12  . amiodarone (PACERONE) 200 MG tablet Take 200 mg daily by mouth.     Marland Kitchen apixaban (ELIQUIS) 5 MG TABS tablet Take 1 tablet (5 mg total) by mouth 2 (two) times daily. 60 tablet 0  . cetirizine (ZYRTEC) 10 MG tablet Take 10 mg by mouth daily as needed for allergies.    . cholecalciferol (VITAMIN D) 1000 units tablet Take 1,000 Units by mouth daily.    Marland Kitchen diltiazem (CARDIZEM CD) 120 MG 24 hr capsule Take 1 capsule (120 mg total) by mouth daily.    . diphenoxylate-atropine (LOMOTIL) 2.5-0.025 MG tablet Take 1 tablet 4 (four) times daily as needed by mouth for diarrhea or loose stools. 30 tablet 0  . furosemide (LASIX) 20 MG tablet Take 1 tablet by mouth daily as needed. For EDEMA  3  . HYDROcodone-acetaminophen (NORCO) 5-325 MG tablet Take 1 tablet by mouth every 4 (four) hours as needed for moderate pain. 20 tablet 0  . lidocaine-prilocaine (EMLA) cream Apply to affected area once 30 g 3  . Multiple Vitamins-Minerals (MULTIVITAMIN ADULT PO) Take by mouth.    . nystatin-triamcinolone ointment (MYCOLOG) APPLY TO AFFECTED AREA TWICE A DAY    . omeprazole (PRILOSEC) 20 MG capsule Take 20 mg by mouth daily.    . ondansetron (ZOFRAN) 8 MG tablet Take 1 tablet (8 mg total) by mouth 2 (two) times daily as needed for refractory nausea / vomiting. 60 tablet 2  . potassium chloride SA (K-DUR,KLOR-CON) 20 MEQ tablet Take 20 mEq 2 (two) times daily by mouth.    . prochlorperazine (COMPAZINE) 10 MG tablet Take 1 tablet (10 mg total) by mouth every 6 (six) hours as needed (Nausea or vomiting). 60 tablet 2  . protein supplement shake (PREMIER PROTEIN) LIQD Take 325 mLs (11 oz total) by mouth 2 (two) times daily between meals. 60 Can 0   No current facility-administered medications for this visit.     OBJECTIVE: Vitals:   02/01/17 0906  BP: (!) 94/56  Pulse: 67  Resp: 18  Temp: 98.7 F (37.1 C)      Body mass index is 28.33 kg/m.    ECOG FS:2 - Symptomatic, <50% confined to bed  General: Well-developed, well-nourished, no acute distress. Eyes: Pink conjunctiva, anicteric sclera. Lungs: Clear to auscultation bilaterally. Heart: Regular rate and rhythm. No rubs, murmurs, or gallops. Abdomen: Soft, nontender, nondistended. No organomegaly noted, normoactive bowel sounds. Musculoskeletal: No edema, cyanosis, or clubbing. Left arm in cast. Neuro: Alert, answering all questions appropriately. Cranial nerves grossly intact. Skin: Maculopapular rash noted, Improved. Psych: Normal affect.    LAB RESULTS:  Lab Results  Component Value Date   NA 133 (L) 02/01/2017   K 3.4 (L) 02/01/2017   CL 101 02/01/2017   CO2 24 02/01/2017   GLUCOSE 118 (H) 02/01/2017   BUN 9 02/01/2017   CREATININE 0.76 02/01/2017   CALCIUM 8.2 (L) 02/01/2017   PROT 5.7 (L) 02/01/2017  ALBUMIN 2.4 (L) 02/01/2017   AST 16 02/01/2017   ALT 20 02/01/2017   ALKPHOS 91 02/01/2017   BILITOT 0.6 02/01/2017   GFRNONAA >60 02/01/2017   GFRAA >60 02/01/2017    Lab Results  Component Value Date   WBC 1.3 (LL) 02/01/2017   NEUTROABS 0.6 (L) 02/01/2017   HGB 7.1 (L) 02/01/2017   HCT 21.1 (L) 02/01/2017   MCV 83.0 02/01/2017   PLT 76 (L) 02/01/2017     STUDIES: Dg Chest 2 View  Result Date: 01/15/2017 CLINICAL DATA:  Shortness of breath. Known lung malignancy. Removal of pleural drainage catheter 13 days ago. Former smoker. EXAM: CHEST  2 VIEW COMPARISON:  Chest x-ray of December 31, 2016 FINDINGS: The right lung is well-expanded and clear. On the left there is base small amount of pleural fluid and moderate amount of pleural thickening noted inferiorly. The volume of fluid is not greatly changed since the previous study. Patchy parenchymal density is present in the lingula and left lower lobe and this also is stable. The heart is normal in size. The pulmonary vascularity is not engorged. The power port catheter  tip projects over the midportion of the SVC. There is calcification in the wall of the thoracic aorta. The bony thorax exhibits no acute abnormality. IMPRESSION: Fairly stable appearance of the chest with persistently increased pleuroparenchymal density in the lower 1/2 of the left lung. No pneumothorax nor large pleural effusion. Underlying COPD. Thoracic aortic atherosclerosis. Electronically Signed   By: David  Martinique M.D.   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  PLAN:    1. 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 Patient refused an MRI of the brain, but did have a head CT on January 02, 2017 that did not report metastatic disease. Treatment was delayed secondary to her broken arm.  Given the multiple problems she has had with chemotherapy treatment, including declining performance status, will discontinue and proceed with second line Keytruda.  Patient will receive this every 3 weeks until intolerable side effects or progression of disease. Hospice was previsouly discussed, but the patient is not ready for this at this time.  Return to clinic in 2 weeks for consideration of cycle 1 of single agent Keytruda.   2. Broken arm: Continue follow-up with orthopedics as scheduled.   3. Pain: Patient does not complaints today.  Continue current narcotic regimen. 4.  Diarrhea: Improving.  C. difficile is negative.  Patient will receive 1 L of IV fluids today.  Continue OTC Imodium.  Discontinue chemotherapy as above. 5. Hyponatremia: Mid. 6. Hypokalemia: Continue oral potassium supplementation.   7.  Pancytopenia: Secondary to chemotherapy.  Discontinue as above and initiate Keytruda in 2 weeks.     Patient expressed understanding and was in agreement with this plan. She also understands that She can call clinic at any time with any questions, concerns, or complaints.   Cancer  Staging Squamous cell carcinoma lung, left ([place]) Staging form: Lung, AJCC 8th Edition - Clinical stage from 12/13/2016: Stage IVA (cT4, cN0, cM1a) - Signed by [name], MD on 12/13/2016   [name], MD   02/01/2017 4:29 PM

## 2017-01-31 NOTE — Telephone Encounter (Signed)
Received a call from family member not on her 62 list asking for results of Stool test today.i explained to her that I cannot discuss with her and she asked that I call patient with results. I spoke with daughter Shirlean Mylar then with granddaughter who reports that patient continues to have diarrhea 9 times since yesterday afternoon. And she is taking the lomotil. I went over dietary concerns with her and she told me that she just got back from store with some gatorade and powerade . Discussed BRAT diet no spices, no fresh fruit or veggiesShe has concerns that the Cdiff is neg and asked about possibility of a false negative. Discussed speaking with Dietician while in clinic tomorrow and was interested in this.

## 2017-01-31 NOTE — Telephone Encounter (Signed)
Thank you :)

## 2017-02-01 ENCOUNTER — Inpatient Hospital Stay: Payer: Medicare HMO

## 2017-02-01 ENCOUNTER — Other Ambulatory Visit: Payer: Self-pay | Admitting: *Deleted

## 2017-02-01 ENCOUNTER — Ambulatory Visit: Admission: RE | Admit: 2017-02-01 | Payer: Medicare HMO | Source: Ambulatory Visit | Admitting: Radiation Oncology

## 2017-02-01 ENCOUNTER — Inpatient Hospital Stay (HOSPITAL_BASED_OUTPATIENT_CLINIC_OR_DEPARTMENT_OTHER): Payer: Medicare HMO | Admitting: Oncology

## 2017-02-01 ENCOUNTER — Ambulatory Visit: Payer: Medicare HMO

## 2017-02-01 VITALS — BP 94/56 | HR 67 | Temp 98.7°F | Resp 18 | Wt 159.9 lb

## 2017-02-01 DIAGNOSIS — J910 Malignant pleural effusion: Secondary | ICD-10-CM

## 2017-02-01 DIAGNOSIS — R110 Nausea: Secondary | ICD-10-CM

## 2017-02-01 DIAGNOSIS — R197 Diarrhea, unspecified: Secondary | ICD-10-CM | POA: Diagnosis not present

## 2017-02-01 DIAGNOSIS — I480 Paroxysmal atrial fibrillation: Secondary | ICD-10-CM | POA: Diagnosis not present

## 2017-02-01 DIAGNOSIS — C3492 Malignant neoplasm of unspecified part of left bronchus or lung: Secondary | ICD-10-CM

## 2017-02-01 DIAGNOSIS — Z8711 Personal history of peptic ulcer disease: Secondary | ICD-10-CM | POA: Diagnosis not present

## 2017-02-01 DIAGNOSIS — Z8781 Personal history of (healed) traumatic fracture: Secondary | ICD-10-CM

## 2017-02-01 DIAGNOSIS — Z7901 Long term (current) use of anticoagulants: Secondary | ICD-10-CM

## 2017-02-01 DIAGNOSIS — Z5111 Encounter for antineoplastic chemotherapy: Secondary | ICD-10-CM | POA: Diagnosis not present

## 2017-02-01 DIAGNOSIS — D61810 Antineoplastic chemotherapy induced pancytopenia: Secondary | ICD-10-CM

## 2017-02-01 DIAGNOSIS — R630 Anorexia: Secondary | ICD-10-CM

## 2017-02-01 DIAGNOSIS — Z8701 Personal history of pneumonia (recurrent): Secondary | ICD-10-CM

## 2017-02-01 DIAGNOSIS — J449 Chronic obstructive pulmonary disease, unspecified: Secondary | ICD-10-CM | POA: Diagnosis not present

## 2017-02-01 DIAGNOSIS — E871 Hypo-osmolality and hyponatremia: Secondary | ICD-10-CM | POA: Diagnosis not present

## 2017-02-01 DIAGNOSIS — D649 Anemia, unspecified: Secondary | ICD-10-CM

## 2017-02-01 DIAGNOSIS — Z87891 Personal history of nicotine dependence: Secondary | ICD-10-CM

## 2017-02-01 DIAGNOSIS — E876 Hypokalemia: Secondary | ICD-10-CM

## 2017-02-01 DIAGNOSIS — Z79899 Other long term (current) drug therapy: Secondary | ICD-10-CM

## 2017-02-01 LAB — COMPREHENSIVE METABOLIC PANEL
ALT: 20 U/L (ref 14–54)
ANION GAP: 8 (ref 5–15)
AST: 16 U/L (ref 15–41)
Albumin: 2.4 g/dL — ABNORMAL LOW (ref 3.5–5.0)
Alkaline Phosphatase: 91 U/L (ref 38–126)
BILIRUBIN TOTAL: 0.6 mg/dL (ref 0.3–1.2)
BUN: 9 mg/dL (ref 6–20)
CHLORIDE: 101 mmol/L (ref 101–111)
CO2: 24 mmol/L (ref 22–32)
Calcium: 8.2 mg/dL — ABNORMAL LOW (ref 8.9–10.3)
Creatinine, Ser: 0.76 mg/dL (ref 0.44–1.00)
Glucose, Bld: 118 mg/dL — ABNORMAL HIGH (ref 65–99)
POTASSIUM: 3.4 mmol/L — AB (ref 3.5–5.1)
Sodium: 133 mmol/L — ABNORMAL LOW (ref 135–145)
TOTAL PROTEIN: 5.7 g/dL — AB (ref 6.5–8.1)

## 2017-02-01 LAB — CBC WITH DIFFERENTIAL/PLATELET
Basophils Absolute: 0 10*3/uL (ref 0–0.1)
Basophils Relative: 1 %
EOS PCT: 3 %
Eosinophils Absolute: 0 10*3/uL (ref 0–0.7)
HEMATOCRIT: 21.1 % — AB (ref 35.0–47.0)
Hemoglobin: 7.1 g/dL — ABNORMAL LOW (ref 12.0–16.0)
LYMPHS PCT: 44 %
Lymphs Abs: 0.6 10*3/uL — ABNORMAL LOW (ref 1.0–3.6)
MCH: 28 pg (ref 26.0–34.0)
MCHC: 33.7 g/dL (ref 32.0–36.0)
MCV: 83 fL (ref 80.0–100.0)
MONO ABS: 0.1 10*3/uL — AB (ref 0.2–0.9)
MONOS PCT: 10 %
NEUTROS ABS: 0.6 10*3/uL — AB (ref 1.4–6.5)
Neutrophils Relative %: 42 %
PLATELETS: 76 10*3/uL — AB (ref 150–440)
RBC: 2.55 MIL/uL — ABNORMAL LOW (ref 3.80–5.20)
RDW: 16.4 % — AB (ref 11.5–14.5)
WBC: 1.3 10*3/uL — CL (ref 3.6–11.0)

## 2017-02-01 MED ORDER — HEPARIN SOD (PORK) LOCK FLUSH 100 UNIT/ML IV SOLN
500.0000 [IU] | Freq: Once | INTRAVENOUS | Status: AC
  Administered 2017-02-01: 500 [IU] via INTRAVENOUS
  Filled 2017-02-01: qty 5

## 2017-02-01 MED ORDER — SODIUM CHLORIDE 0.9 % IV SOLN
Freq: Once | INTRAVENOUS | Status: AC
  Administered 2017-02-01: 10:00:00 via INTRAVENOUS
  Filled 2017-02-01: qty 1000

## 2017-02-01 MED ORDER — SODIUM CHLORIDE 0.9% FLUSH
10.0000 mL | Freq: Once | INTRAVENOUS | Status: AC
  Administered 2017-02-01: 10 mL via INTRAVENOUS
  Filled 2017-02-01: qty 10

## 2017-02-01 NOTE — Progress Notes (Signed)
DISCONTINUE OFF PATHWAY REGIMEN - Non-Small Cell Lung   [id number]:Carboplatin AUC=6 D1 + Paclitaxel 80 mg/m2 Weekly (Dose Dense) q21 Days:   A cycle is every 21 days:     Paclitaxel      Carboplatin   **Always confirm dose/schedule in your pharmacy ordering system**    REASON: Toxicities / Adverse Event PRIOR TREATMENT: Off Pathway: Carboplatin AUC=6 D1 + Paclitaxel 80 mg/m2 Weekly (Dose Dense) q21 Days TREATMENT RESPONSE: Unable to Evaluate  START ON PATHWAY REGIMEN - Non-Small Cell Lung     A cycle is 21 days:     Pembrolizumab   **Always confirm dose/schedule in your pharmacy ordering system**    Patient Characteristics: Stage IV Metastatic, Squamous, PS = 2, First Line, PD-L1 Expression Positive ? 50% (TPS) AJCC T Category: T4 Current Disease Status: Distant Metastases AJCC N Category: NX AJCC M Category: M1b AJCC 8 Stage Grouping: IVA Histology: Squamous Cell Line of therapy: First Line PD-L1 Expression Status: PD-L1 Positive ? 50% (TPS) Performance Status: PS = 2 Would you be surprised if this patient died  in the next year<= I would NOT be surprised if this patient died in the next year Intent of Therapy: Non-Curative / Palliative Intent, Discussed with Patient

## 2017-02-01 NOTE — Progress Notes (Signed)
Nutrition Follow-up:  Patient with lung cancer and followed by Dr. Grayland Ormond.  Met with patient and daughter in law today before MD appointment, briefly.  Patient has been having increased diarrhea since Saturday, 9 + stools per day.    Medications: reviewed  Labs: Na 133, K 3.4, glucose 118  Anthropometrics:   Weight stable today at 159 lb 14.4 oz same as weight on 11/8 but decreased overall   NUTRITION DIAGNOSIS: Inadequate oral intake currently due to diarrhea   MALNUTRITION DIAGNOSIS: continue to monitor   INTERVENTION:   Reviewed foods to eat with diarrhea and "Diarrhea" handout from [employer] provided.   Samples given of pedilyte with coupons for patient to try.     MONITORING, EVALUATION, GOAL: weight trends, intake   NEXT VISIT: as needed  Virgia Kelner B. Zenia Resides, [place], [place] Registered Dietitian [phone] (pager)

## 2017-02-01 NOTE — Telephone Encounter (Signed)
Connie requesting refill on medicines that we are not ordering. She will contact PCP

## 2017-02-01 NOTE — Progress Notes (Signed)
Patient is here for follow up, she has no major complaints today. She is drinking [employer] protein since she has a lousy appetite.   Redo the home health PT.

## 2017-02-05 ENCOUNTER — Telehealth: Payer: Self-pay | Admitting: *Deleted

## 2017-02-05 ENCOUNTER — Other Ambulatory Visit: Payer: Self-pay | Admitting: Nurse Practitioner

## 2017-02-05 DIAGNOSIS — T451X5A Adverse effect of antineoplastic and immunosuppressive drugs, initial encounter: Principal | ICD-10-CM

## 2017-02-05 DIAGNOSIS — K521 Toxic gastroenteritis and colitis: Secondary | ICD-10-CM

## 2017-02-05 MED ORDER — DIPHENOXYLATE-ATROPINE 2.5-0.025 MG PO TABS: 1.0000 | ORAL_TABLET | Freq: Four times a day (QID) | ORAL | 0 refills | Status: DC | PRN

## 2017-02-05 NOTE — Telephone Encounter (Signed)
Patient continues to have 3-4 watery stools per day and is almost out of Lomotil, Asking for a refill Please advise

## 2017-02-05 NOTE — Telephone Encounter (Signed)
Yes refill.  Thank you.

## 2017-02-13 ENCOUNTER — Other Ambulatory Visit: Payer: Self-pay | Admitting: *Deleted

## 2017-02-13 MED ORDER — ONDANSETRON HCL 8 MG PO TABS: 8.0000 mg | ORAL_TABLET | Freq: Two times a day (BID) | ORAL | 0 refills | Status: DC | PRN

## 2017-02-13 MED ORDER — OMEPRAZOLE 20 MG PO CPDR: 20.0000 mg | DELAYED_RELEASE_CAPSULE | Freq: Every day | ORAL | 2 refills | Status: DC

## 2017-02-13 NOTE — Progress Notes (Signed)
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  INTERVAL HISTORY: Patient returns to clinic today for further evaluation and initiation of Keytruda.  She currently feels well and is nearly back to her baseline.  Her appetite has significantly improved.  She does not complain of weakness or fatigue today. She has no neurologic complaints. She does not complain of rib or arm pain today. She has no chest pain, shortness of breath, cough, or hemoptysis. She denies any nausea, vomiting, constipation, or diarrhea. She has no urinary complaints. Patient offers no specific complaints today.  REVIEW OF SYSTEMS:   Review of Systems  Constitutional: Negative.  Negative for fever, malaise/fatigue and weight loss.  Respiratory: Negative.  Negative for cough and shortness of breath.   Cardiovascular: Negative.  Negative for chest pain and leg swelling.  Gastrointestinal: Negative.  Negative for abdominal pain and diarrhea.  Genitourinary: Negative.   Musculoskeletal: Negative.  Negative for back pain, falls and joint pain.  Skin: Negative.  Negative for itching and rash.  Neurological: Negative.  Negative for sensory change and weakness.  Psychiatric/Behavioral: Negative.  The patient is not nervous/anxious.     As per HPI. Otherwise, a complete review of systems is negative.  PAST MEDICAL HISTORY: Past Medical History:  Diagnosis Date  . COPD (chronic obstructive pulmonary disease) ([place])   . GU (gastric peptic ulcer)   . Lung cancer ([place])   . Malignant pleural effusion   . PAF (paroxysmal atrial fibrillation) ([place]) 10/2016   a. 10/2016 post thoracentesis ; b. 10/2016 Echo: EF 65-70%, mild LVH;  c. Amio/Eliquis initiated  (CHA2DSVASc = 3).  . Pneumonia   . Wrist fracture 1993   bilateral    PAST SURGICAL HISTORY: Past Surgical History:  Procedure Laterality Date  . ABDOMINAL HYSTERECTOMY    . CATARACT EXTRACTION W/ INTRAOCULAR LENS  IMPLANT, BILATERAL    . CHEST TUBE INSERTION Left 12/18/2016   Procedure: CHEST TUBE INSERTION;  Surgeon: [name], MD;  Location: [facility] ORS;  Service: General;  Laterality: Left;  . CHOLECYSTECTOMY    . CLOSED REDUCTION WRIST FRACTURE Left 01/04/2017   Procedure: CLOSED REDUCTION WRIST;  Surgeon: Thornton Park, MD;  Location: [facility] ORS;  Service: Orthopedics;  Laterality: Left;  . PORTACATH PLACEMENT Left 12/18/2016   Procedure: INSERTION PORT-A-CATH;  Surgeon: [name], MD;  Location: [facility] ORS;  Service: General;  Laterality: Left;  . REMOVAL OF PLEURAL DRAINAGE CATHETER N/A 01/02/2017   Procedure: REMOVAL OF PLEURAL DRAINAGE CATHETER;  Surgeon: [name], MD;  Location: [facility] ORS;  Service: Thoracic;  Laterality: N/A;  . TONSILLECTOMY      FAMILY HISTORY: Family History  Problem Relation Age of Onset  . Hypertension Father   . Heart disease Father   . Heart attack Father   . Congestive Heart Failure Mother   . Stroke Mother   . Atrial fibrillation Sister     ADVANCED DIRECTIVES (Y/N):  N  HEALTH MAINTENANCE: Social History   Tobacco Use  . Smoking status: Former Smoker    Packs/day: 1.00    Years: 68.00    Pack years: 68.00    Last attempt to quit: 11/06/2016    Years since quitting: 0.2  . Smokeless tobacco: Never Used  Substance Use  Topics  . Alcohol use: No  . Drug use: No     Colonoscopy:  PAP:  Bone density:  Lipid panel:  Allergies  Allergen Reactions  . Sulfa Antibiotics Other (See Comments)    Seizures   . Levaquin [Levofloxacin] Hives and Itching    Current Outpatient Medications  Medication Sig Dispense Refill  . albuterol (PROVENTIL) (2.5 MG/3ML) 0.083% nebulizer solution Take 3 mLs (2.5 mg total) by nebulization 2  (two) times daily. 75 mL 12  . amiodarone (PACERONE) 200 MG tablet Take 200 mg daily by mouth.     Marland Kitchen apixaban (ELIQUIS) 5 MG TABS tablet Take 1 tablet (5 mg total) by mouth 2 (two) times daily. 60 tablet 0  . cetirizine (ZYRTEC) 10 MG tablet Take 10 mg by mouth daily as needed for allergies.    . cholecalciferol (VITAMIN D) 1000 units tablet Take 1,000 Units by mouth daily.    Marland Kitchen diltiazem (CARDIZEM CD) 120 MG 24 hr capsule Take 1 capsule (120 mg total) by mouth daily.    . diphenoxylate-atropine (LOMOTIL) 2.5-0.025 MG tablet Take 1 tablet 4 (four) times daily as needed by mouth for diarrhea or loose stools. 120 tablet 0  . furosemide (LASIX) 20 MG tablet Take 1 tablet by mouth daily as needed. For EDEMA  3  . HYDROcodone-acetaminophen (NORCO) 5-325 MG tablet Take 1 tablet by mouth every 4 (four) hours as needed for moderate pain. 20 tablet 0  . Multiple Vitamins-Minerals (MULTIVITAMIN ADULT PO) Take by mouth.    . nystatin-triamcinolone ointment (MYCOLOG) APPLY TO AFFECTED AREA TWICE A DAY    . omeprazole (PRILOSEC) 20 MG capsule Take 1 capsule (20 mg total) by mouth daily. 90 capsule 2  . ondansetron (ZOFRAN) 8 MG tablet Take 1 tablet (8 mg total) by mouth 2 (two) times daily as needed for nausea or vomiting. for refractory nausea / vomiting 120 tablet 0  . potassium chloride SA (K-DUR,KLOR-CON) 20 MEQ tablet Take 20 mEq 2 (two) times daily by mouth.    . protein supplement shake (PREMIER PROTEIN) LIQD Take 325 mLs (11 oz total) by mouth 2 (two) times daily between meals. 60 Can 0   No current facility-administered medications for this visit.     OBJECTIVE: Vitals:   02/15/17 1059  BP: 105/68  Pulse: 67  Temp: (!) 97 F (36.1 C)     Body mass index is 27.97 kg/m.    ECOG FS:1 - Symptomatic but completely ambulatory  General: Well-developed, well-nourished, no acute distress. Eyes: Pink conjunctiva, anicteric sclera. Lungs: Clear to auscultation bilaterally. Heart: Regular rate and  rhythm. No rubs, murmurs, or gallops. Abdomen: Soft, nontender, nondistended. No organomegaly noted, normoactive bowel sounds. Musculoskeletal: No edema, cyanosis, or clubbing. Left arm in cast. Neuro: Alert, answering all questions appropriately. Cranial nerves grossly intact. Skin: Maculopapular rash noted, Improved. Psych: Normal affect.    LAB RESULTS:  Lab Results  Component Value Date   NA 135 02/15/2017   K 3.8 02/15/2017   CL 100 (L) 02/15/2017   CO2 26 02/15/2017   GLUCOSE 112 (H) 02/15/2017   BUN 11 02/15/2017   CREATININE 0.89 02/15/2017   CALCIUM 8.2 (L) 02/15/2017   PROT 5.9 (L) 02/15/2017   ALBUMIN 2.5 (L) 02/15/2017   AST 22 02/15/2017   ALT 9 (L) 02/15/2017   ALKPHOS 101 02/15/2017   BILITOT 0.4 02/15/2017   GFRNONAA 59 (L) 02/15/2017   GFRAA >60 02/15/2017    Lab Results  Component Value Date   WBC  10.5 02/15/2017   NEUTROABS 7.3 (H) 02/15/2017   HGB 8.9 (L) 02/15/2017   HCT 27.1 (L) 02/15/2017   MCV 87.7 02/15/2017   PLT 314 02/15/2017     STUDIES: No results found.  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  PLAN:    1. 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 Patient refused an MRI of the brain, but did have a head CT on January 02, 2017 that did not report metastatic disease. Treatment was delayed secondary to her broken arm.  Given the multiple problems she has had with chemotherapy treatment, including declining performance status, will discontinue and proceed with second line Keytruda.  Patient will receive this every 3 weeks until intolerable side effects or progression of disease. Hospice was previsouly discussed, but the patient is not ready for this at this time.  Proceed with cycle 1 of single agent Keytruda.  Return to clinic in 3 weeks for consideration of cycle 2. 2. Broken arm: Continue follow-up with orthopedics as scheduled.  Patient  reports that she likely will require surgery in the near future. 3. Pain: Patient does not complaints today.  Continue current narcotic regimen. 4.  Diarrhea: Resolved.  C. difficile is negative. Continue Lomotil as needed.  Discontinue chemotherapy as above. 5. Hyponatremia: Resolved. 6. Hypokalemia: Resolved. Continue oral potassium supplementation.   7.  Anemia: Secondary to chemotherapy.  Monitor.   Patient expressed understanding and was in agreement with this plan. She also understands that She can call clinic at any time with any questions, concerns, or complaints.   Cancer Staging Squamous cell carcinoma lung, left ([place]) Staging form: Lung, AJCC 8th Edition - Clinical stage from 12/13/2016: Stage IVA (cT4, cN0, cM1a) - Signed by [name], MD on 12/13/2016   [name], MD   02/17/2017 8:32 AM

## 2017-02-15 ENCOUNTER — Inpatient Hospital Stay: Payer: Medicare HMO

## 2017-02-15 ENCOUNTER — Inpatient Hospital Stay (HOSPITAL_BASED_OUTPATIENT_CLINIC_OR_DEPARTMENT_OTHER): Payer: Medicare HMO | Admitting: Oncology

## 2017-02-15 VITALS — BP 105/68 | HR 67 | Temp 97.0°F | Wt 157.9 lb

## 2017-02-15 DIAGNOSIS — Z8781 Personal history of (healed) traumatic fracture: Secondary | ICD-10-CM

## 2017-02-15 DIAGNOSIS — Z7901 Long term (current) use of anticoagulants: Secondary | ICD-10-CM

## 2017-02-15 DIAGNOSIS — C3492 Malignant neoplasm of unspecified part of left bronchus or lung: Secondary | ICD-10-CM

## 2017-02-15 DIAGNOSIS — R630 Anorexia: Secondary | ICD-10-CM | POA: Diagnosis not present

## 2017-02-15 DIAGNOSIS — J910 Malignant pleural effusion: Secondary | ICD-10-CM | POA: Diagnosis not present

## 2017-02-15 DIAGNOSIS — J449 Chronic obstructive pulmonary disease, unspecified: Secondary | ICD-10-CM

## 2017-02-15 DIAGNOSIS — Z5111 Encounter for antineoplastic chemotherapy: Secondary | ICD-10-CM | POA: Diagnosis not present

## 2017-02-15 DIAGNOSIS — D6481 Anemia due to antineoplastic chemotherapy: Secondary | ICD-10-CM | POA: Diagnosis not present

## 2017-02-15 DIAGNOSIS — I480 Paroxysmal atrial fibrillation: Secondary | ICD-10-CM

## 2017-02-15 DIAGNOSIS — Z8701 Personal history of pneumonia (recurrent): Secondary | ICD-10-CM | POA: Diagnosis not present

## 2017-02-15 DIAGNOSIS — Z8711 Personal history of peptic ulcer disease: Secondary | ICD-10-CM | POA: Diagnosis not present

## 2017-02-15 DIAGNOSIS — Z79899 Other long term (current) drug therapy: Secondary | ICD-10-CM

## 2017-02-15 DIAGNOSIS — Z87891 Personal history of nicotine dependence: Secondary | ICD-10-CM

## 2017-02-15 LAB — COMPREHENSIVE METABOLIC PANEL
ALT: 9 U/L — AB (ref 14–54)
ANION GAP: 9 (ref 5–15)
AST: 22 U/L (ref 15–41)
Albumin: 2.5 g/dL — ABNORMAL LOW (ref 3.5–5.0)
Alkaline Phosphatase: 101 U/L (ref 38–126)
BUN: 11 mg/dL (ref 6–20)
CHLORIDE: 100 mmol/L — AB (ref 101–111)
CO2: 26 mmol/L (ref 22–32)
CREATININE: 0.89 mg/dL (ref 0.44–1.00)
Calcium: 8.2 mg/dL — ABNORMAL LOW (ref 8.9–10.3)
GFR, EST NON AFRICAN AMERICAN: 59 mL/min — AB (ref >60)
Glucose, Bld: 112 mg/dL — ABNORMAL HIGH (ref 65–99)
POTASSIUM: 3.8 mmol/L (ref 3.5–5.1)
SODIUM: 135 mmol/L (ref 135–145)
Total Bilirubin: 0.4 mg/dL (ref 0.3–1.2)
Total Protein: 5.9 g/dL — ABNORMAL LOW (ref 6.5–8.1)

## 2017-02-15 LAB — CBC WITH DIFFERENTIAL/PLATELET
Basophils Absolute: 0.1 10*3/uL (ref 0–0.1)
Basophils Relative: 1 %
Eosinophils Absolute: 0.2 10*3/uL (ref 0–0.7)
Eosinophils Relative: 2 %
HEMATOCRIT: 27.1 % — AB (ref 35.0–47.0)
HEMOGLOBIN: 8.9 g/dL — AB (ref 12.0–16.0)
LYMPHS ABS: 2 10*3/uL (ref 1.0–3.6)
LYMPHS PCT: 19 %
MCH: 28.7 pg (ref 26.0–34.0)
MCHC: 32.7 g/dL (ref 32.0–36.0)
MCV: 87.7 fL (ref 80.0–100.0)
MONOS PCT: 9 %
Monocytes Absolute: 0.9 10*3/uL (ref 0.2–0.9)
NEUTROS ABS: 7.3 10*3/uL — AB (ref 1.4–6.5)
NEUTROS PCT: 69 %
Platelets: 314 10*3/uL (ref 150–440)
RBC: 3.09 MIL/uL — AB (ref 3.80–5.20)
RDW: 21.6 % — ABNORMAL HIGH (ref 11.5–14.5)
WBC: 10.5 10*3/uL (ref 3.6–11.0)

## 2017-02-15 MED ORDER — SODIUM CHLORIDE 0.9 % IV SOLN
Freq: Once | INTRAVENOUS | Status: AC
  Administered 2017-02-15: 12:00:00 via INTRAVENOUS
  Filled 2017-02-15: qty 1000

## 2017-02-15 MED ORDER — HEPARIN SOD (PORK) LOCK FLUSH 100 UNIT/ML IV SOLN
500.0000 [IU] | Freq: Once | INTRAVENOUS | Status: AC | PRN
  Administered 2017-02-15: 500 [IU]
  Filled 2017-02-15: qty 5

## 2017-02-15 MED ORDER — SODIUM CHLORIDE 0.9% FLUSH
10.0000 mL | INTRAVENOUS | Status: DC | PRN
  Administered 2017-02-15: 10 mL
  Filled 2017-02-15: qty 10

## 2017-02-15 MED ORDER — PEMBROLIZUMAB CHEMO INJECTION 100 MG/4ML
200.0000 mg | Freq: Once | INTRAVENOUS | Status: AC
  Administered 2017-02-15: 200 mg via INTRAVENOUS
  Filled 2017-02-15: qty 8

## 2017-02-16 LAB — THYROID PANEL WITH TSH
FREE THYROXINE INDEX: 2.9 (ref 1.2–4.9)
T3 Uptake Ratio: 31 % (ref 24–39)
T4, Total: 9.4 ug/dL (ref 4.5–12.0)
TSH: 2.24 u[IU]/mL (ref 0.450–4.500)

## 2017-02-26 ENCOUNTER — Ambulatory Visit: Payer: Medicare HMO | Admitting: Radiation Oncology

## 2017-03-05 NOTE — Progress Notes (Signed)
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  INTERVAL HISTORY: Patient returns to clinic today for further evaluation and consideration of cycle 2 of Keytruda.  She feels significantly improved since discontinuing chemotherapy and initiating immunotherapy.  Her appetite has significantly improved.  She does not complain of weakness or fatigue today. She has no neurologic complaints. She does not complain of rib or arm pain today. She has no chest pain, shortness of breath, cough, or hemoptysis. She denies any nausea, vomiting, constipation, or diarrhea. She has no urinary complaints. Patient offers no specific complaints today.  REVIEW OF SYSTEMS:   Review of Systems  Constitutional: Negative.  Negative for fever, malaise/fatigue and weight loss.  Respiratory: Negative.  Negative for cough and shortness of breath.   Cardiovascular: Negative.  Negative for chest pain and leg swelling.  Gastrointestinal: Negative.  Negative for abdominal pain and diarrhea.  Genitourinary: Negative.   Musculoskeletal: Negative.  Negative for back pain, falls and joint pain.  Skin: Negative.  Negative for itching and rash.  Neurological: Negative.  Negative for sensory change and weakness.  Psychiatric/Behavioral: Negative.  The patient is not nervous/anxious.     As per HPI. Otherwise, a complete review of systems is negative.  PAST MEDICAL HISTORY: Past Medical History:  Diagnosis Date  . COPD (chronic obstructive pulmonary disease) ([place])   . GU (gastric peptic ulcer)   . Lung cancer ([place])   . Malignant pleural effusion   . PAF (paroxysmal atrial fibrillation) ([place]) 10/2016   a. 10/2016 post thoracentesis ; b. 10/2016 Echo:  EF 65-70%, mild LVH;  c. Amio/Eliquis initiated (CHA2DSVASc = 3).  . Pneumonia   . Wrist fracture 1993   bilateral    PAST SURGICAL HISTORY: Past Surgical History:  Procedure Laterality Date  . ABDOMINAL HYSTERECTOMY    . CATARACT EXTRACTION W/ INTRAOCULAR LENS  IMPLANT, BILATERAL    . CHEST TUBE INSERTION Left 12/18/2016   Procedure: CHEST TUBE INSERTION;  Surgeon: [name], MD;  Location: [facility] ORS;  Service: General;  Laterality: Left;  . CHOLECYSTECTOMY    . CLOSED REDUCTION WRIST FRACTURE Left 01/04/2017   Procedure: CLOSED REDUCTION WRIST;  Surgeon: Thornton Park, MD;  Location: [facility] ORS;  Service: Orthopedics;  Laterality: Left;  . PORTACATH PLACEMENT Left 12/18/2016   Procedure: INSERTION PORT-A-CATH;  Surgeon: [name], MD;  Location: [facility] ORS;  Service: General;  Laterality: Left;  . REMOVAL OF PLEURAL DRAINAGE CATHETER N/A 01/02/2017   Procedure: REMOVAL OF PLEURAL DRAINAGE CATHETER;  Surgeon: [name], MD;  Location: [facility] ORS;  Service: Thoracic;  Laterality: N/A;  . TONSILLECTOMY      FAMILY HISTORY: Family History  Problem Relation Age of Onset  . Hypertension Father   . Heart disease Father   . Heart attack Father   . Congestive Heart Failure Mother   . Stroke Mother   . Atrial fibrillation Sister     ADVANCED DIRECTIVES (Y/N):  N  HEALTH MAINTENANCE: Social History   Tobacco Use  . Smoking status: Former Smoker    Packs/day: 1.00    Years: 68.00    Pack years: 68.00    Last attempt to quit: 11/06/2016    Years since quitting: 0.3  . Smokeless tobacco: Never Used  Substance Use Topics  . Alcohol use: No  . Drug use: No     Colonoscopy:  PAP:  Bone density:  Lipid panel:  Allergies  Allergen Reactions  . Sulfa Antibiotics Other (See Comments)    Seizures   . Levaquin [Levofloxacin] Hives and Itching    Current Outpatient Medications  Medication Sig Dispense Refill  . albuterol (PROVENTIL) (2.5 MG/3ML) 0.083% nebulizer  solution Take 3 mLs (2.5 mg total) by nebulization 2 (two) times daily. 75 mL 12  . amiodarone (PACERONE) 200 MG tablet Take 100 mg by mouth daily.     Marland Kitchen apixaban (ELIQUIS) 5 MG TABS tablet Take 1 tablet (5 mg total) by mouth 2 (two) times daily. 60 tablet 0  . cetirizine (ZYRTEC) 10 MG tablet Take 10 mg by mouth daily as needed for allergies.    . cholecalciferol (VITAMIN D) 1000 units tablet Take 1,000 Units by mouth daily.    Marland Kitchen diltiazem (CARDIZEM CD) 120 MG 24 hr capsule Take 1 capsule (120 mg total) by mouth daily.    . diphenoxylate-atropine (LOMOTIL) 2.5-0.025 MG tablet Take 1 tablet 4 (four) times daily as needed by mouth for diarrhea or loose stools. 120 tablet 0  . furosemide (LASIX) 20 MG tablet Take 1 tablet by mouth daily as needed. For EDEMA  3  . HYDROcodone-acetaminophen (NORCO) 5-325 MG tablet Take 1 tablet by mouth every 4 (four) hours as needed for moderate pain. 20 tablet 0  . Multiple Vitamins-Minerals (MULTIVITAMIN ADULT PO) Take by mouth.    . nystatin-triamcinolone ointment (MYCOLOG) APPLY TO AFFECTED AREA TWICE A DAY    . omeprazole (PRILOSEC) 20 MG capsule Take 1 capsule (20 mg total) by mouth daily. 90 capsule 2  . ondansetron (ZOFRAN) 8 MG tablet Take 1 tablet (8 mg total) by mouth 2 (two) times daily as needed for nausea or vomiting. for refractory nausea / vomiting 120 tablet 0  . potassium chloride SA (K-DUR,KLOR-CON) 20 MEQ tablet Take 20 mEq 2 (two) times daily by mouth.    . protein supplement shake (PREMIER PROTEIN) LIQD Take 325 mLs (11 oz total) by mouth 2 (two) times daily between meals. 60 Can 0   No current facility-administered medications for this visit.     OBJECTIVE: Vitals:   03/07/17 1158  BP: 104/62  Pulse: 67  Resp: 18  Temp: 98 F (36.7 C)     Body mass index is 27.92 kg/m.    ECOG FS:1 - Symptomatic but completely ambulatory  General: Well-developed, well-nourished, no acute distress. Eyes: Pink conjunctiva, anicteric sclera. Lungs:  Clear to auscultation bilaterally. Heart: Regular rate and rhythm. No rubs, murmurs, or gallops. Abdomen: Soft, nontender, nondistended. No organomegaly noted, normoactive bowel sounds. Musculoskeletal: No edema, cyanosis, or clubbing. Left arm in cast. Neuro: Alert, answering all questions appropriately. Cranial nerves grossly intact. Skin: Maculopapular rash noted, Improved. Psych: Normal affect.    LAB RESULTS:  Lab Results  Component Value Date   NA 134 (L) 03/07/2017   K 4.0 03/07/2017   CL 99 (L) 03/07/2017   CO2 26 03/07/2017   GLUCOSE 105 (H) 03/07/2017   BUN 12 03/07/2017   CREATININE 0.89 03/07/2017   CALCIUM 8.6 (L) 03/07/2017   PROT 6.4 (L) 03/07/2017   ALBUMIN 2.7 (L) 03/07/2017   AST 15 03/07/2017   ALT 9 (L) 03/07/2017   ALKPHOS 97 03/07/2017   BILITOT 0.5 03/07/2017   GFRNONAA 59 (L) 03/07/2017   GFRAA >60 03/07/2017    Lab Results  Component  Value Date   WBC 13.6 (H) 03/07/2017   NEUTROABS 10.0 (H) 03/07/2017   HGB 8.9 (L) 03/07/2017   HCT 27.8 (L) 03/07/2017   MCV 87.4 03/07/2017   PLT 379 03/07/2017     STUDIES: No results found.  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  PLAN:    1. 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 Patient refused an MRI of the brain, but did have a head CT on January 02, 2017 that did not report metastatic disease. Treatment was initially delayed secondary to her broken arm.  Given the multiple problems she has had with chemotherapy treatment, including declining performance status, this was discontinued and proceeded with second line Keytruda.  Patient will receive this every 3 weeks until intolerable side effects or progression of disease. Hospice was previsouly discussed, but the patient is not ready for this at this time.  Proceed with cycle 2 of single agent Keytruda.  Return to clinic in 3 weeks  for consideration of cycle 3. 2. Broken arm: Continue follow-up with orthopedics as scheduled.  Patient reports that she likely does not require surgery. 3. Pain: Patient does not complaint of this today.  Continue current narcotic regimen. 4.  Diarrhea: Resolved.  C. difficile is negative. Continue Lomotil as needed.  Discontinue chemotherapy as above. 5. Hyponatremia: Mild, monitor. 6. Hypokalemia: Resolved. Continue oral potassium supplementation.   7.  Anemia: Secondary to chemotherapy.  Monitor.   Patient expressed understanding and was in agreement with this plan. She also understands that She can call clinic at any time with any questions, concerns, or complaints.   Cancer Staging Squamous cell carcinoma lung, left ([place]) Staging form: Lung, AJCC 8th Edition - Clinical stage from 12/13/2016: Stage IVA (cT4, cN0, cM1a) - Signed by [name], MD on 12/13/2016   [name], MD   03/09/2017 11:02 AM

## 2017-03-07 ENCOUNTER — Inpatient Hospital Stay (HOSPITAL_BASED_OUTPATIENT_CLINIC_OR_DEPARTMENT_OTHER): Payer: Medicare HMO | Admitting: Oncology

## 2017-03-07 ENCOUNTER — Other Ambulatory Visit: Payer: Self-pay | Admitting: Oncology

## 2017-03-07 ENCOUNTER — Inpatient Hospital Stay: Payer: Medicare HMO

## 2017-03-07 ENCOUNTER — Inpatient Hospital Stay: Payer: Medicare HMO | Attending: Oncology

## 2017-03-07 VITALS — BP 104/62 | HR 67 | Temp 98.0°F | Resp 18 | Wt 157.6 lb

## 2017-03-07 DIAGNOSIS — I4891 Unspecified atrial fibrillation: Secondary | ICD-10-CM | POA: Diagnosis not present

## 2017-03-07 DIAGNOSIS — Z8781 Personal history of (healed) traumatic fracture: Secondary | ICD-10-CM

## 2017-03-07 DIAGNOSIS — Z9221 Personal history of antineoplastic chemotherapy: Secondary | ICD-10-CM | POA: Diagnosis not present

## 2017-03-07 DIAGNOSIS — J90 Pleural effusion, not elsewhere classified: Secondary | ICD-10-CM | POA: Diagnosis not present

## 2017-03-07 DIAGNOSIS — D6481 Anemia due to antineoplastic chemotherapy: Secondary | ICD-10-CM | POA: Diagnosis not present

## 2017-03-07 DIAGNOSIS — Z87891 Personal history of nicotine dependence: Secondary | ICD-10-CM | POA: Insufficient documentation

## 2017-03-07 DIAGNOSIS — C3492 Malignant neoplasm of unspecified part of left bronchus or lung: Secondary | ICD-10-CM

## 2017-03-07 DIAGNOSIS — J449 Chronic obstructive pulmonary disease, unspecified: Secondary | ICD-10-CM | POA: Diagnosis not present

## 2017-03-07 DIAGNOSIS — E871 Hypo-osmolality and hyponatremia: Secondary | ICD-10-CM

## 2017-03-07 DIAGNOSIS — Z8701 Personal history of pneumonia (recurrent): Secondary | ICD-10-CM | POA: Insufficient documentation

## 2017-03-07 DIAGNOSIS — Z5112 Encounter for antineoplastic immunotherapy: Secondary | ICD-10-CM | POA: Diagnosis present

## 2017-03-07 DIAGNOSIS — Z8711 Personal history of peptic ulcer disease: Secondary | ICD-10-CM

## 2017-03-07 LAB — CBC WITH DIFFERENTIAL/PLATELET
BASOS ABS: 0.1 10*3/uL (ref 0–0.1)
Basophils Relative: 1 %
Eosinophils Absolute: 0.9 10*3/uL — ABNORMAL HIGH (ref 0–0.7)
Eosinophils Relative: 7 %
HEMATOCRIT: 27.8 % — AB (ref 35.0–47.0)
Hemoglobin: 8.9 g/dL — ABNORMAL LOW (ref 12.0–16.0)
LYMPHS ABS: 1.6 10*3/uL (ref 1.0–3.6)
LYMPHS PCT: 12 %
MCH: 27.8 pg (ref 26.0–34.0)
MCHC: 31.9 g/dL — ABNORMAL LOW (ref 32.0–36.0)
MCV: 87.4 fL (ref 80.0–100.0)
MONO ABS: 1 10*3/uL — AB (ref 0.2–0.9)
Monocytes Relative: 8 %
NEUTROS ABS: 10 10*3/uL — AB (ref 1.4–6.5)
Neutrophils Relative %: 74 %
Platelets: 379 10*3/uL (ref 150–440)
RBC: 3.18 MIL/uL — AB (ref 3.80–5.20)
RDW: 18.6 % — ABNORMAL HIGH (ref 11.5–14.5)
WBC: 13.6 10*3/uL — AB (ref 3.6–11.0)

## 2017-03-07 LAB — COMPREHENSIVE METABOLIC PANEL
ALBUMIN: 2.7 g/dL — AB (ref 3.5–5.0)
ALK PHOS: 97 U/L (ref 38–126)
ALT: 9 U/L — ABNORMAL LOW (ref 14–54)
ANION GAP: 9 (ref 5–15)
AST: 15 U/L (ref 15–41)
BILIRUBIN TOTAL: 0.5 mg/dL (ref 0.3–1.2)
BUN: 12 mg/dL (ref 6–20)
CALCIUM: 8.6 mg/dL — AB (ref 8.9–10.3)
CO2: 26 mmol/L (ref 22–32)
Chloride: 99 mmol/L — ABNORMAL LOW (ref 101–111)
Creatinine, Ser: 0.89 mg/dL (ref 0.44–1.00)
GFR, EST NON AFRICAN AMERICAN: 59 mL/min — AB (ref >60)
Glucose, Bld: 105 mg/dL — ABNORMAL HIGH (ref 65–99)
POTASSIUM: 4 mmol/L (ref 3.5–5.1)
Sodium: 134 mmol/L — ABNORMAL LOW (ref 135–145)
TOTAL PROTEIN: 6.4 g/dL — AB (ref 6.5–8.1)

## 2017-03-07 MED ORDER — HEPARIN SOD (PORK) LOCK FLUSH 100 UNIT/ML IV SOLN
500.0000 [IU] | Freq: Once | INTRAVENOUS | Status: AC | PRN
  Administered 2017-03-07: 500 [IU]
  Filled 2017-03-07: qty 5

## 2017-03-07 MED ORDER — SODIUM CHLORIDE 0.9 % IV SOLN
Freq: Once | INTRAVENOUS | Status: AC
  Administered 2017-03-07: 13:00:00 via INTRAVENOUS
  Filled 2017-03-07: qty 1000

## 2017-03-07 MED ORDER — SODIUM CHLORIDE 0.9 % IV SOLN
200.0000 mg | Freq: Once | INTRAVENOUS | Status: AC
  Administered 2017-03-07: 200 mg via INTRAVENOUS
  Filled 2017-03-07: qty 8

## 2017-03-07 MED ORDER — SODIUM CHLORIDE 0.9% FLUSH
10.0000 mL | INTRAVENOUS | Status: DC | PRN
  Administered 2017-03-07: 10 mL
  Filled 2017-03-07: qty 10

## 2017-03-07 NOTE — Progress Notes (Signed)
Patient is here for follow up, she mentions pain around her port, she has lost her appetite but has taste. She had diarrhea but extremely thirsty. She has nausea but takes her medication and feels better.

## 2017-03-08 ENCOUNTER — Ambulatory Visit: Payer: Medicare HMO

## 2017-03-08 ENCOUNTER — Other Ambulatory Visit: Payer: Medicare HMO

## 2017-03-08 ENCOUNTER — Ambulatory Visit: Payer: Medicare HMO | Admitting: Oncology

## 2017-03-08 LAB — THYROID PANEL WITH TSH
Free Thyroxine Index: 3.4 (ref 1.2–4.9)
T3 UPTAKE RATIO: 33 % (ref 24–39)
T4 TOTAL: 10.2 ug/dL (ref 4.5–12.0)
TSH: 0.993 u[IU]/mL (ref 0.450–4.500)

## 2017-03-23 ENCOUNTER — Other Ambulatory Visit: Payer: Self-pay

## 2017-03-23 ENCOUNTER — Encounter: Payer: Self-pay | Admitting: Radiation Oncology

## 2017-03-23 ENCOUNTER — Ambulatory Visit
Admission: RE | Admit: 2017-03-23 | Discharge: 2017-03-23 | Disposition: A | Discharge status: Home / Self Care | Payer: Medicare HMO | Source: Ambulatory Visit | Attending: Radiation Oncology | Admitting: Radiation Oncology

## 2017-03-23 VITALS — BP 115/77 | HR 74 | Temp 98.0°F | Resp 18 | Wt 151.7 lb

## 2017-03-23 DIAGNOSIS — Z923 Personal history of irradiation: Secondary | ICD-10-CM | POA: Insufficient documentation

## 2017-03-23 DIAGNOSIS — C7951 Secondary malignant neoplasm of bone: Secondary | ICD-10-CM | POA: Insufficient documentation

## 2017-03-23 DIAGNOSIS — C3492 Malignant neoplasm of unspecified part of left bronchus or lung: Secondary | ICD-10-CM | POA: Insufficient documentation

## 2017-03-23 DIAGNOSIS — R110 Nausea: Secondary | ICD-10-CM | POA: Diagnosis not present

## 2017-03-23 NOTE — Progress Notes (Signed)
Radiation Oncology Follow up Note  Name: [name]   Date:   03/23/2017 MRN:  [id number] DOB: 04-04-1932    This 84 y.o. female presents to the clinic today for one-month follow-up status post single fraction palliative treatment to her left ribs for stage IV lung cancer.  REFERRING PROVIDER: Albina Billet, MD  HPI: Patient is a 84 year old female with stage IV lung cancer now 1 month out of palliative radiation therapy in a single fraction to her left ribs for stage IV lung cancer. She seen today and doing fairly well. She states the pain went away although recently has returned somewhat although not significantly. She is currently receiving Keytruda. And is tolerating that well without side effect. She states she is occasionally nauseated.  COMPLICATIONS OF TREATMENT: none  FOLLOW UP COMPLIANCE: keeps appointments   PHYSICAL EXAM:  BP 115/77   Pulse 74   Temp 98 F (36.7 C)   Resp 18   Wt 151 lb 10.8 oz (68.8 kg)   BMI 26.87 kg/m  Deep palpation of her left ribs does not elicit pain. Well-developed well-nourished patient in NAD. HEENT reveals PERLA, EOMI, discs not visualized.  Oral cavity is clear. No oral mucosal lesions are identified. Neck is clear without evidence of cervical or supraclavicular adenopathy. Lungs are clear to A&P. Cardiac examination is essentially unremarkable with regular rate and rhythm without murmur rub or thrill. Abdomen is benign with no organomegaly or masses noted. Motor sensory and DTR levels are equal and symmetric in the upper and lower extremities. Cranial nerves II through XII are grossly intact. Proprioception is intact. No peripheral adenopathy or edema is identified. No motor or sensory levels are noted. Crude visual fields are within normal range.  RADIOLOGY RESULTS: No current films for review  PLAN: Present time patient achieved good palliation of pain in her left rib cage. I will turn follow-up care over to medical oncology. I would be happy  to reevaluate the patient any time should further palliative treatment be indicated. Patient is to call with any concerns.  I would like to take this opportunity to thank you for allowing me to participate in the care of your patient.Noreene Filbert, MD

## 2017-03-25 NOTE — Progress Notes (Signed)
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  INTERVAL HISTORY: Patient returns to clinic today for further evaluation and consideration of cycle 3 of Keytruda.  Patient currently feels well and is asymptomatic.  She has a good appetite and no longer complains of weight loss. She does not complain of weakness or fatigue today. She has no neurologic complaints. She does not complain of rib or arm pain today. She has no chest pain, shortness of breath, cough, or hemoptysis. She denies any nausea, vomiting, constipation, or diarrhea. She has no urinary complaints. Patient offers no specific complaints today.  REVIEW OF SYSTEMS:   Review of Systems  Constitutional: Negative.  Negative for fever, malaise/fatigue and weight loss.  Respiratory: Negative.  Negative for cough and shortness of breath.   Cardiovascular: Negative.  Negative for chest pain and leg swelling.  Gastrointestinal: Negative.  Negative for abdominal pain and diarrhea.  Genitourinary: Negative.   Musculoskeletal: Negative.  Negative for back pain, falls and joint pain.  Skin: Negative.  Negative for itching and rash.  Neurological: Negative.  Negative for sensory change and weakness.  Psychiatric/Behavioral: Negative.  The patient is not nervous/anxious.     As per HPI. Otherwise, a complete review of systems is negative.  PAST MEDICAL HISTORY: Past Medical History:  Diagnosis Date  . COPD (chronic obstructive pulmonary disease) ([place])   . GU (gastric peptic ulcer)   . Lung cancer ([place])   . Malignant pleural effusion   . PAF (paroxysmal atrial fibrillation) ([place]) 10/2016   a. 10/2016 post thoracentesis ; b. 10/2016 Echo: EF 65-70%, mild LVH;  c.  Amio/Eliquis initiated (CHA2DSVASc = 3).  . Pneumonia   . Wrist fracture 1993   bilateral    PAST SURGICAL HISTORY: Past Surgical History:  Procedure Laterality Date  . ABDOMINAL HYSTERECTOMY    . CATARACT EXTRACTION W/ INTRAOCULAR LENS  IMPLANT, BILATERAL    . CHEST TUBE INSERTION Left 12/18/2016   Procedure: CHEST TUBE INSERTION;  Surgeon: [name], MD;  Location: [facility] ORS;  Service: General;  Laterality: Left;  . CHOLECYSTECTOMY    . CLOSED REDUCTION WRIST FRACTURE Left 01/04/2017   Procedure: CLOSED REDUCTION WRIST;  Surgeon: Thornton Park, MD;  Location: [facility] ORS;  Service: Orthopedics;  Laterality: Left;  . PORTACATH PLACEMENT Left 12/18/2016   Procedure: INSERTION PORT-A-CATH;  Surgeon: [name], MD;  Location: [facility] ORS;  Service: General;  Laterality: Left;  . REMOVAL OF PLEURAL DRAINAGE CATHETER N/A 01/02/2017   Procedure: REMOVAL OF PLEURAL DRAINAGE CATHETER;  Surgeon: [name], MD;  Location: [facility] ORS;  Service: Thoracic;  Laterality: N/A;  . TONSILLECTOMY      FAMILY HISTORY: Family History  Problem Relation Age of Onset  . Hypertension Father   . Heart disease Father   . Heart attack Father   . Congestive Heart Failure Mother   . Stroke Mother   . Atrial fibrillation Sister     ADVANCED DIRECTIVES (Y/N):  N  HEALTH MAINTENANCE: Social History   Tobacco Use  . Smoking status: Former Smoker    Packs/day: 1.00    Years: 68.00    Pack years: 68.00    Last attempt to quit: 11/06/2016    Years since quitting: 0.4  . Smokeless tobacco:  Never Used  Substance Use Topics  . Alcohol use: No  . Drug use: No     Colonoscopy:  PAP:  Bone density:  Lipid panel:  Allergies  Allergen Reactions  . Sulfa Antibiotics Other (See Comments)    Seizures   . Levaquin [Levofloxacin] Hives and Itching    Current Outpatient Medications  Medication Sig Dispense Refill  . albuterol (PROVENTIL) (2.5 MG/3ML) 0.083% nebulizer solution Take 3 mLs (2.5 mg  total) by nebulization 2 (two) times daily. 75 mL 12  . amiodarone (PACERONE) 200 MG tablet Take 100 mg by mouth daily.     Marland Kitchen apixaban (ELIQUIS) 5 MG TABS tablet Take 1 tablet (5 mg total) by mouth 2 (two) times daily. 60 tablet 0  . cetirizine (ZYRTEC) 10 MG tablet Take 10 mg by mouth daily as needed for allergies.    . cholecalciferol (VITAMIN D) 1000 units tablet Take 1,000 Units by mouth daily.    Marland Kitchen diltiazem (CARDIZEM CD) 120 MG 24 hr capsule Take 1 capsule (120 mg total) by mouth daily.    . diphenoxylate-atropine (LOMOTIL) 2.5-0.025 MG tablet Take 1 tablet 4 (four) times daily as needed by mouth for diarrhea or loose stools. 120 tablet 0  . furosemide (LASIX) 20 MG tablet Take 1 tablet by mouth daily as needed. For EDEMA  3  . HYDROcodone-acetaminophen (NORCO) 5-325 MG tablet Take 1 tablet by mouth every 4 (four) hours as needed for moderate pain. 30 tablet 0  . Multiple Vitamins-Minerals (MULTIVITAMIN ADULT PO) Take by mouth.    . nystatin-triamcinolone ointment (MYCOLOG) APPLY TO AFFECTED AREA TWICE A DAY    . omeprazole (PRILOSEC) 20 MG capsule Take 1 capsule (20 mg total) by mouth daily. 90 capsule 2  . ondansetron (ZOFRAN) 8 MG tablet Take 1 tablet (8 mg total) by mouth 2 (two) times daily as needed for nausea or vomiting. for refractory nausea / vomiting 120 tablet 0  . potassium chloride SA (K-DUR,KLOR-CON) 20 MEQ tablet Take 20 mEq 2 (two) times daily by mouth.    . protein supplement shake (PREMIER PROTEIN) LIQD Take 325 mLs (11 oz total) by mouth 2 (two) times daily between meals. 60 Can 0  . prochlorperazine (COMPAZINE) 10 MG tablet Take 1 tablet (10 mg total) by mouth every 6 (six) hours as needed (Nausea or vomiting). 60 tablet 2   No current facility-administered medications for this visit.     OBJECTIVE: Vitals:   03/28/17 1146  BP: 122/73  Pulse: 69  Resp: 18  Temp: (!) 97.3 F (36.3 C)     Body mass index is 27.28 kg/m.    ECOG FS:1 - Symptomatic but completely  ambulatory  General: Well-developed, well-nourished, no acute distress. Eyes: Pink conjunctiva, anicteric sclera. Lungs: Clear to auscultation bilaterally. Heart: Regular rate and rhythm. No rubs, murmurs, or gallops. Abdomen: Soft, nontender, nondistended. No organomegaly noted, normoactive bowel sounds. Musculoskeletal: No edema, cyanosis, or clubbing. Left arm in cast. Neuro: Alert, answering all questions appropriately. Cranial nerves grossly intact. Skin: Maculopapular rash noted, Improved. Psych: Normal affect.    LAB RESULTS:  Lab Results  Component Value Date   NA 137 03/28/2017   K 3.7 03/28/2017   CL 103 03/28/2017   CO2 25 03/28/2017   GLUCOSE 110 (H) 03/28/2017   BUN 11 03/28/2017   CREATININE 0.91 03/28/2017   CALCIUM 8.9 03/28/2017   PROT 6.8 03/28/2017   ALBUMIN 3.1 (L) 03/28/2017   AST 17 03/28/2017   ALT 8 (L) 03/28/2017  ALKPHOS 81 03/28/2017   BILITOT 0.4 03/28/2017   GFRNONAA 57 (L) 03/28/2017   GFRAA >60 03/28/2017    Lab Results  Component Value Date   WBC 11.1 (H) 03/28/2017   NEUTROABS 7.5 (H) 03/28/2017   HGB 9.5 (L) 03/28/2017   HCT 29.5 (L) 03/28/2017   MCV 85.6 03/28/2017   PLT 280 03/28/2017     STUDIES: No results found.  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  PLAN:    1. 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 Patient refused an MRI of the brain, but did have a head CT on January 02, 2017 that did not report metastatic disease. Treatment was initially delayed secondary to her broken arm.  Given the multiple problems she has had with chemotherapy treatment, including declining performance status, this was discontinued and proceeded with second line Keytruda.  Patient will receive this every 3 weeks until intolerable side effects or progression of disease. Hospice was previsouly discussed, but the patient is  not ready for this at this time.  Proceed with cycle 3 of single agent Keytruda.  Return to clinic in 3 weeks for consideration of cycle 4. 2. Broken arm: Continue follow-up with orthopedics as scheduled.  Patient reports that she likely does not require surgery. 3. Pain: Patient does not complain of this today.  Continue current narcotic regimen. 4.  Diarrhea: Resolved.  C. difficile is negative. Continue Lomotil as needed.  Discontinue chemotherapy as above. 5. Hyponatremia: Resolved. 6. Hypokalemia: Resolved. Continue oral potassium supplementation.   7.  Anemia: Mild, monitor.   Patient expressed understanding and was in agreement with this plan. She also understands that She can call clinic at any time with any questions, concerns, or complaints.   Cancer Staging Squamous cell carcinoma lung, left ([place]) Staging form: Lung, AJCC 8th Edition - Clinical stage from 12/13/2016: Stage IVA (cT4, cN0, cM1a) - Signed by [name], MD on 12/13/2016   [name], MD   04/01/2017 8:28 AM

## 2017-03-28 ENCOUNTER — Inpatient Hospital Stay (HOSPITAL_BASED_OUTPATIENT_CLINIC_OR_DEPARTMENT_OTHER): Payer: Medicare HMO | Admitting: Oncology

## 2017-03-28 ENCOUNTER — Inpatient Hospital Stay: Payer: Medicare HMO

## 2017-03-28 ENCOUNTER — Ambulatory Visit: Payer: Medicare HMO | Admitting: Oncology

## 2017-03-28 ENCOUNTER — Ambulatory Visit: Payer: Medicare HMO

## 2017-03-28 ENCOUNTER — Other Ambulatory Visit: Payer: Medicare HMO

## 2017-03-28 ENCOUNTER — Inpatient Hospital Stay: Payer: Medicare HMO | Attending: Oncology

## 2017-03-28 VITALS — BP 122/73 | HR 69 | Temp 97.3°F | Resp 18 | Wt 154.0 lb

## 2017-03-28 DIAGNOSIS — C3492 Malignant neoplasm of unspecified part of left bronchus or lung: Secondary | ICD-10-CM

## 2017-03-28 DIAGNOSIS — Z79899 Other long term (current) drug therapy: Secondary | ICD-10-CM

## 2017-03-28 DIAGNOSIS — Z8711 Personal history of peptic ulcer disease: Secondary | ICD-10-CM

## 2017-03-28 DIAGNOSIS — D649 Anemia, unspecified: Secondary | ICD-10-CM

## 2017-03-28 DIAGNOSIS — I4891 Unspecified atrial fibrillation: Secondary | ICD-10-CM

## 2017-03-28 DIAGNOSIS — Z5112 Encounter for antineoplastic immunotherapy: Secondary | ICD-10-CM | POA: Diagnosis present

## 2017-03-28 DIAGNOSIS — Z7901 Long term (current) use of anticoagulants: Secondary | ICD-10-CM | POA: Diagnosis not present

## 2017-03-28 DIAGNOSIS — C3412 Malignant neoplasm of upper lobe, left bronchus or lung: Secondary | ICD-10-CM | POA: Insufficient documentation

## 2017-03-28 DIAGNOSIS — Z87891 Personal history of nicotine dependence: Secondary | ICD-10-CM

## 2017-03-28 LAB — COMPREHENSIVE METABOLIC PANEL
ALBUMIN: 3.1 g/dL — AB (ref 3.5–5.0)
ALT: 8 U/L — AB (ref 14–54)
AST: 17 U/L (ref 15–41)
Alkaline Phosphatase: 81 U/L (ref 38–126)
Anion gap: 9 (ref 5–15)
BUN: 11 mg/dL (ref 6–20)
CHLORIDE: 103 mmol/L (ref 101–111)
CO2: 25 mmol/L (ref 22–32)
CREATININE: 0.91 mg/dL (ref 0.44–1.00)
Calcium: 8.9 mg/dL (ref 8.9–10.3)
GFR calc Af Amer: >60 mL/min (ref >60)
GFR calc non Af Amer: 57 mL/min — ABNORMAL LOW (ref >60)
GLUCOSE: 110 mg/dL — AB (ref 65–99)
POTASSIUM: 3.7 mmol/L (ref 3.5–5.1)
Sodium: 137 mmol/L (ref 135–145)
Total Bilirubin: 0.4 mg/dL (ref 0.3–1.2)
Total Protein: 6.8 g/dL (ref 6.5–8.1)

## 2017-03-28 LAB — CBC WITH DIFFERENTIAL/PLATELET
Basophils Absolute: 0.1 10*3/uL (ref 0–0.1)
Basophils Relative: 1 %
EOS ABS: 1.1 10*3/uL — AB (ref 0–0.7)
Eosinophils Relative: 10 %
HCT: 29.5 % — ABNORMAL LOW (ref 35.0–47.0)
Hemoglobin: 9.5 g/dL — ABNORMAL LOW (ref 12.0–16.0)
LYMPHS ABS: 1.7 10*3/uL (ref 1.0–3.6)
LYMPHS PCT: 15 %
MCH: 27.7 pg (ref 26.0–34.0)
MCHC: 32.3 g/dL (ref 32.0–36.0)
MCV: 85.6 fL (ref 80.0–100.0)
MONO ABS: 0.8 10*3/uL (ref 0.2–0.9)
MONOS PCT: 7 %
Neutro Abs: 7.5 10*3/uL — ABNORMAL HIGH (ref 1.4–6.5)
Neutrophils Relative %: 67 %
PLATELETS: 280 10*3/uL (ref 150–440)
RBC: 3.44 MIL/uL — ABNORMAL LOW (ref 3.80–5.20)
RDW: 17 % — AB (ref 11.5–14.5)
WBC: 11.1 10*3/uL — ABNORMAL HIGH (ref 3.6–11.0)

## 2017-03-28 MED ORDER — HEPARIN SOD (PORK) LOCK FLUSH 100 UNIT/ML IV SOLN
500.0000 [IU] | Freq: Once | INTRAVENOUS | Status: AC | PRN
  Administered 2017-03-28: 500 [IU]

## 2017-03-28 MED ORDER — SODIUM CHLORIDE 0.9 % IV SOLN
Freq: Once | INTRAVENOUS | Status: AC
  Administered 2017-03-28: 12:00:00 via INTRAVENOUS
  Filled 2017-03-28: qty 1000

## 2017-03-28 MED ORDER — HYDROCODONE-ACETAMINOPHEN 5-325 MG PO TABS: 1.0000 | ORAL_TABLET | ORAL | 0 refills | Status: DC | PRN

## 2017-03-28 MED ORDER — PROCHLORPERAZINE MALEATE 10 MG PO TABS: 10.0000 mg | ORAL_TABLET | Freq: Four times a day (QID) | ORAL | 2 refills | Status: DC | PRN

## 2017-03-28 MED ORDER — SODIUM CHLORIDE 0.9 % IV SOLN
200.0000 mg | Freq: Once | INTRAVENOUS | Status: AC
  Administered 2017-03-28: 200 mg via INTRAVENOUS
  Filled 2017-03-28: qty 8

## 2017-03-29 LAB — THYROID PANEL WITH TSH
Free Thyroxine Index: 2.8 (ref 1.2–4.9)
T3 Uptake Ratio: 29 % (ref 24–39)
T4, Total: 9.8 ug/dL (ref 4.5–12.0)
TSH: 1.01 u[IU]/mL (ref 0.450–4.500)

## 2017-04-03 ENCOUNTER — Other Ambulatory Visit: Payer: Self-pay | Admitting: Oncology

## 2017-04-15 NOTE — Progress Notes (Signed)
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  INTERVAL HISTORY: Patient returns to clinic today for further evaluation and consideration of cycle 4 of Keytruda.  She is tolerating her treatments well without significant side effects.  She currently feels well and is asymptomatic.  She has a good appetite and no longer complains of weight loss. She does not complain of weakness or fatigue today. She has no neurologic complaints. She does not complain of rib or arm pain today. She has no chest pain, shortness of breath, cough, or hemoptysis. She denies any nausea, vomiting, constipation, or diarrhea. She has no urinary complaints. Patient offers no specific complaints today.  REVIEW OF SYSTEMS:   Review of Systems  Constitutional: Negative.  Negative for fever, malaise/fatigue and weight loss.  Respiratory: Negative.  Negative for cough and shortness of breath.   Cardiovascular: Negative.  Negative for chest pain and leg swelling.  Gastrointestinal: Negative.  Negative for abdominal pain and diarrhea.  Genitourinary: Negative.   Musculoskeletal: Negative.  Negative for back pain, falls and joint pain.  Skin: Negative.  Negative for itching and rash.  Neurological: Negative.  Negative for sensory change and weakness.  Psychiatric/Behavioral: Negative.  The patient is not nervous/anxious.     As per HPI. Otherwise, a complete review of systems is negative.  PAST MEDICAL HISTORY: Past Medical History:  Diagnosis Date  . COPD (chronic obstructive pulmonary disease) ([place])   . GU (gastric peptic ulcer)   . Lung cancer ([place])   . Malignant pleural effusion   . PAF (paroxysmal atrial fibrillation) ([place]) 10/2016   a.  10/2016 post thoracentesis ; b. 10/2016 Echo: EF 65-70%, mild LVH;  c. Amio/Eliquis initiated (CHA2DSVASc = 3).  . Pneumonia   . Wrist fracture 1993   bilateral    PAST SURGICAL HISTORY: Past Surgical History:  Procedure Laterality Date  . ABDOMINAL HYSTERECTOMY    . CATARACT EXTRACTION W/ INTRAOCULAR LENS  IMPLANT, BILATERAL    . CHEST TUBE INSERTION Left 12/18/2016   Procedure: CHEST TUBE INSERTION;  Surgeon: [name], MD;  Location: [facility] ORS;  Service: General;  Laterality: Left;  . CHOLECYSTECTOMY    . CLOSED REDUCTION WRIST FRACTURE Left 01/04/2017   Procedure: CLOSED REDUCTION WRIST;  Surgeon: Thornton Park, MD;  Location: [facility] OR[facility];  Service: Orthopedics;  Laterality: Left;  . PORTACATH PLACEMENT Left 12/18/2016   Procedure: INSERTION PORT-A-CATH;  Surgeon: [name], MD;  Location: [facility] OR[facility];  Service: General;  Laterality: Left;  . REMOVAL OF PLEURAL DRAINAGE CATHETER N/A 01/02/2017   Procedure: REMOVAL OF PLEURAL DRAINAGE CATHETER;  Surgeon: [name], MD;  Location: [facility] ORS;  Service: Thoracic;  Laterality: N/A;  . TONSILLECTOMY      FAMILY HISTORY: Family History  Problem Relation Age of Onset  . Hypertension Father   . Heart disease Father   . Heart attack Father   . Congestive Heart Failure Mother   . Stroke Mother   . Atrial fibrillation Sister     ADVANCED DIRECTIVES (Y/N):  N  HEALTH MAINTENANCE: Social History   Tobacco Use  . Smoking status: Former Smoker    Packs/day: 1.00    Years: 68.00    Pack years: 68.00    Last attempt to quit: 11/06/2016  Years since quitting: 0.4  . Smokeless tobacco: Never Used  Substance Use Topics  . Alcohol use: No  . Drug use: No     Colonoscopy:  PAP:  Bone density:  Lipid panel:  Allergies  Allergen Reactions  . Sulfa Antibiotics Other (See Comments)    Seizures   . Levaquin [Levofloxacin] Hives and Itching    Current Outpatient Medications  Medication Sig Dispense Refill  . albuterol  (PROVENTIL) (2.5 MG/3ML) 0.083% nebulizer solution Take 3 mLs (2.5 mg total) by nebulization 2 (two) times daily. 75 mL 12  . amiodarone (PACERONE) 200 MG tablet Take 100 mg by mouth daily.     Marland Kitchen apixaban (ELIQUIS) 5 MG TABS tablet Take 1 tablet (5 mg total) by mouth 2 (two) times daily. 60 tablet 0  . cetirizine (ZYRTEC) 10 MG tablet Take 10 mg by mouth daily as needed for allergies.    . cholecalciferol (VITAMIN D) 1000 units tablet Take 1,000 Units by mouth daily.    Marland Kitchen diltiazem (CARDIZEM CD) 120 MG 24 hr capsule Take 1 capsule (120 mg total) by mouth daily.    . diphenoxylate-atropine (LOMOTIL) 2.5-0.025 MG tablet Take 1 tablet 4 (four) times daily as needed by mouth for diarrhea or loose stools. 120 tablet 0  . furosemide (LASIX) 20 MG tablet Take 1 tablet by mouth daily as needed. For EDEMA  3  . HYDROcodone-acetaminophen (NORCO) 5-325 MG tablet Take 1 tablet by mouth every 4 (four) hours as needed for moderate pain. 30 tablet 0  . Multiple Vitamins-Minerals (MULTIVITAMIN ADULT PO) Take by mouth.    . nystatin-triamcinolone ointment (MYCOLOG) APPLY TO AFFECTED AREA TWICE A DAY    . omeprazole (PRILOSEC) 20 MG capsule Take 1 capsule (20 mg total) by mouth daily. 90 capsule 2  . ondansetron (ZOFRAN) 8 MG tablet TAKE 1 TABLET TWO TIMES DAILY AS NEEDED FOR REFRACTORY NAUSEA OR VOMITING.  120 tablet 0  . potassium chloride SA (K-DUR,KLOR-CON) 20 MEQ tablet Take 20 mEq 2 (two) times daily by mouth.    . prochlorperazine (COMPAZINE) 10 MG tablet Take 1 tablet (10 mg total) by mouth every 6 (six) hours as needed (Nausea or vomiting). 60 tablet 2  . protein supplement shake (PREMIER PROTEIN) LIQD Take 325 mLs (11 oz total) by mouth 2 (two) times daily between meals. 60 Can 0   No current facility-administered medications for this visit.     OBJECTIVE: Vitals:   04/18/17 1054  BP: 131/66  Pulse: 69  Resp: 18  Temp: (!) 97 F (36.1 C)     Body mass index is 27.74 kg/m.    ECOG FS:1 -  Symptomatic but completely ambulatory  General: Well-developed, well-nourished, no acute distress. Eyes: Pink conjunctiva, anicteric sclera. Lungs: Clear to auscultation bilaterally. Heart: Regular rate and rhythm. No rubs, murmurs, or gallops. Abdomen: Soft, nontender, nondistended. No organomegaly noted, normoactive bowel sounds. Musculoskeletal: No edema, cyanosis, or clubbing. Left arm in cast. Neuro: Alert, answering all questions appropriately. Cranial nerves grossly intact. Skin: Maculopapular rash noted, Improved. Psych: Normal affect.    LAB RESULTS:  Lab Results  Component Value Date   NA 138 04/18/2017   K 3.4 (L) 04/18/2017   CL 106 04/18/2017   CO2 22 04/18/2017   GLUCOSE 134 (H) 04/18/2017   BUN 14 04/18/2017   CREATININE 0.85 04/18/2017   CALCIUM 8.8 (L) 04/18/2017   PROT 6.4 (L) 04/18/2017   ALBUMIN 3.0 (L) 04/18/2017   AST 19 04/18/2017   ALT 9 (L)  04/18/2017   ALKPHOS 81 04/18/2017   BILITOT 0.4 04/18/2017   GFRNONAA >60 04/18/2017   GFRAA >60 04/18/2017    Lab Results  Component Value Date   WBC 10.6 04/18/2017   NEUTROABS 7.6 (H) 04/18/2017   HGB 9.8 (L) 04/18/2017   HCT 30.7 (L) 04/18/2017   MCV 83.8 04/18/2017   PLT 258 04/18/2017     STUDIES: No results found.  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  PLAN:    1. 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 Patient refused an MRI of the brain, but did have a head CT on January 02, 2017 that did not report metastatic disease. Treatment was initially delayed secondary to her broken arm.  Given the multiple problems she has had with chemotherapy treatment, including declining performance status, this was discontinued and proceeded with second line Keytruda.  Patient will receive this every 3 weeks until intolerable side effects or progression of disease. Proceed with cycle 4 of  single agent Keytruda.  Return to clinic in 3 weeks for consideration of cycle 5.  Will reimage with PET scan prior to next treatment. 2. Broken arm: Continue follow-up with orthopedics as scheduled.  Patient reports that she likely does not require surgery. 3. Pain: Patient does not complain of this today.  Continue current narcotic regimen. 4.  Diarrhea: Resolved.  C. difficile is negative. Continue Lomotil as needed.  5. Hyponatremia: Resolved. 6. Hypokalemia: Resolved. Continue oral potassium supplementation.   7.  Anemia: Mild, monitor.   Patient expressed understanding and was in agreement with this plan. She also understands that She can call clinic at any time with any questions, concerns, or complaints.   Cancer Staging Squamous cell carcinoma lung, left ([place]) Staging form: Lung, AJCC 8th Edition - Clinical stage from 12/13/2016: Stage IVA (cT4, cN0, cM1a) - Signed by [name], MD on 12/13/2016   [name], MD   04/18/2017 11:06 AM

## 2017-04-18 ENCOUNTER — Inpatient Hospital Stay (HOSPITAL_BASED_OUTPATIENT_CLINIC_OR_DEPARTMENT_OTHER): Payer: Medicare HMO | Admitting: Oncology

## 2017-04-18 ENCOUNTER — Inpatient Hospital Stay: Payer: Medicare HMO

## 2017-04-18 VITALS — BP 131/66 | HR 69 | Temp 97.0°F | Resp 18 | Wt 156.6 lb

## 2017-04-18 DIAGNOSIS — Z7901 Long term (current) use of anticoagulants: Secondary | ICD-10-CM

## 2017-04-18 DIAGNOSIS — Z87891 Personal history of nicotine dependence: Secondary | ICD-10-CM

## 2017-04-18 DIAGNOSIS — C3492 Malignant neoplasm of unspecified part of left bronchus or lung: Secondary | ICD-10-CM | POA: Diagnosis not present

## 2017-04-18 DIAGNOSIS — I4891 Unspecified atrial fibrillation: Secondary | ICD-10-CM

## 2017-04-18 DIAGNOSIS — Z79899 Other long term (current) drug therapy: Secondary | ICD-10-CM

## 2017-04-18 DIAGNOSIS — D649 Anemia, unspecified: Secondary | ICD-10-CM

## 2017-04-18 DIAGNOSIS — Z5112 Encounter for antineoplastic immunotherapy: Secondary | ICD-10-CM | POA: Diagnosis not present

## 2017-04-18 DIAGNOSIS — Z8711 Personal history of peptic ulcer disease: Secondary | ICD-10-CM | POA: Diagnosis not present

## 2017-04-18 LAB — CBC WITH DIFFERENTIAL/PLATELET
BASOS PCT: 1 %
Basophils Absolute: 0.1 10*3/uL (ref 0–0.1)
EOS ABS: 0.9 10*3/uL — AB (ref 0–0.7)
Eosinophils Relative: 8 %
HEMATOCRIT: 30.7 % — AB (ref 35.0–47.0)
HEMOGLOBIN: 9.8 g/dL — AB (ref 12.0–16.0)
LYMPHS ABS: 1.3 10*3/uL (ref 1.0–3.6)
Lymphocytes Relative: 12 %
MCH: 26.9 pg (ref 26.0–34.0)
MCHC: 32.1 g/dL (ref 32.0–36.0)
MCV: 83.8 fL (ref 80.0–100.0)
Monocytes Absolute: 0.8 10*3/uL (ref 0.2–0.9)
Monocytes Relative: 7 %
NEUTROS ABS: 7.6 10*3/uL — AB (ref 1.4–6.5)
NEUTROS PCT: 72 %
Platelets: 258 10*3/uL (ref 150–440)
RBC: 3.66 MIL/uL — AB (ref 3.80–5.20)
RDW: 15.7 % — ABNORMAL HIGH (ref 11.5–14.5)
WBC: 10.6 10*3/uL (ref 3.6–11.0)

## 2017-04-18 LAB — COMPREHENSIVE METABOLIC PANEL
ALT: 9 U/L — AB (ref 14–54)
AST: 19 U/L (ref 15–41)
Albumin: 3 g/dL — ABNORMAL LOW (ref 3.5–5.0)
Alkaline Phosphatase: 81 U/L (ref 38–126)
Anion gap: 10 (ref 5–15)
BUN: 14 mg/dL (ref 6–20)
CALCIUM: 8.8 mg/dL — AB (ref 8.9–10.3)
CHLORIDE: 106 mmol/L (ref 101–111)
CO2: 22 mmol/L (ref 22–32)
CREATININE: 0.85 mg/dL (ref 0.44–1.00)
GFR calc non Af Amer: >60 mL/min (ref >60)
Glucose, Bld: 134 mg/dL — ABNORMAL HIGH (ref 65–99)
Potassium: 3.4 mmol/L — ABNORMAL LOW (ref 3.5–5.1)
SODIUM: 138 mmol/L (ref 135–145)
Total Bilirubin: 0.4 mg/dL (ref 0.3–1.2)
Total Protein: 6.4 g/dL — ABNORMAL LOW (ref 6.5–8.1)

## 2017-04-18 MED ORDER — SODIUM CHLORIDE 0.9 % IV SOLN
200.0000 mg | Freq: Once | INTRAVENOUS | Status: AC
  Administered 2017-04-18: 200 mg via INTRAVENOUS
  Filled 2017-04-18: qty 8

## 2017-04-18 MED ORDER — HEPARIN SOD (PORK) LOCK FLUSH 100 UNIT/ML IV SOLN
500.0000 [IU] | Freq: Once | INTRAVENOUS | Status: AC | PRN
  Administered 2017-04-18: 500 [IU]
  Filled 2017-04-18: qty 5

## 2017-04-18 MED ORDER — SODIUM CHLORIDE 0.9% FLUSH
10.0000 mL | INTRAVENOUS | Status: DC | PRN
  Administered 2017-04-18: 10 mL
  Filled 2017-04-18: qty 10

## 2017-04-18 MED ORDER — SODIUM CHLORIDE 0.9 % IV SOLN
Freq: Once | INTRAVENOUS | Status: AC
  Administered 2017-04-18: 12:00:00 via INTRAVENOUS
  Filled 2017-04-18: qty 1000

## 2017-04-19 LAB — THYROID PANEL WITH TSH
Free Thyroxine Index: 2.6 (ref 1.2–4.9)
T3 UPTAKE RATIO: 28 % (ref 24–39)
T4, Total: 9.2 ug/dL (ref 4.5–12.0)
TSH: 1.82 u[IU]/mL (ref 0.450–4.500)

## 2017-05-04 ENCOUNTER — Ambulatory Visit
Admission: RE | Admit: 2017-05-04 | Discharge: 2017-05-04 | Disposition: A | Discharge status: Home / Self Care | Payer: Medicare HMO | Source: Ambulatory Visit | Attending: Oncology | Admitting: Oncology

## 2017-05-04 DIAGNOSIS — C3492 Malignant neoplasm of unspecified part of left bronchus or lung: Secondary | ICD-10-CM | POA: Insufficient documentation

## 2017-05-04 DIAGNOSIS — J90 Pleural effusion, not elsewhere classified: Secondary | ICD-10-CM | POA: Insufficient documentation

## 2017-05-04 DIAGNOSIS — D3501 Benign neoplasm of right adrenal gland: Secondary | ICD-10-CM | POA: Insufficient documentation

## 2017-05-04 DIAGNOSIS — E041 Nontoxic single thyroid nodule: Secondary | ICD-10-CM | POA: Diagnosis not present

## 2017-05-04 DIAGNOSIS — J320 Chronic maxillary sinusitis: Secondary | ICD-10-CM | POA: Diagnosis not present

## 2017-05-04 DIAGNOSIS — K5730 Diverticulosis of large intestine without perforation or abscess without bleeding: Secondary | ICD-10-CM | POA: Diagnosis not present

## 2017-05-04 DIAGNOSIS — I700 Atherosclerosis of aorta: Secondary | ICD-10-CM | POA: Insufficient documentation

## 2017-05-04 DIAGNOSIS — I2510 Atherosclerotic heart disease of native coronary artery without angina pectoris: Secondary | ICD-10-CM | POA: Diagnosis not present

## 2017-05-04 LAB — GLUCOSE, CAPILLARY: Glucose-Capillary: 104 mg/dL — ABNORMAL HIGH (ref 65–99)

## 2017-05-04 MED ORDER — FLUDEOXYGLUCOSE F - 18 (FDG) INJECTION
12.3900 | Freq: Once | INTRAVENOUS | Status: AC | PRN
  Administered 2017-05-04: 12.39 via INTRAVENOUS

## 2017-05-06 NOTE — Progress Notes (Signed)
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  INTERVAL HISTORY: Patient returns to clinic today for further evaluation and consideration of cycle 6 of Keytruda and discussion of her imaging results.  She continues to tolerate her treatments well without significant side effects.  She currently feels well and is asymptomatic.  She has a good appetite and is gaining weight. She does not complain of weakness or fatigue today. She has no neurologic complaints. She does not complain of rib or arm pain today. She has no chest pain, shortness of breath, cough, or hemoptysis. She denies any nausea, vomiting, constipation, or diarrhea. She has no urinary complaints. Patient offers no specific complaints today.  REVIEW OF SYSTEMS:   Review of Systems  Constitutional: Negative.  Negative for fever, malaise/fatigue and weight loss.  Respiratory: Negative.  Negative for cough and shortness of breath.   Cardiovascular: Negative.  Negative for chest pain and leg swelling.  Gastrointestinal: Negative.  Negative for abdominal pain and diarrhea.  Genitourinary: Negative.   Musculoskeletal: Negative.  Negative for back pain, falls and joint pain.  Skin: Negative.  Negative for itching and rash.  Neurological: Negative.  Negative for sensory change and weakness.  Psychiatric/Behavioral: Negative.  The patient is not nervous/anxious.     As per HPI. Otherwise, a complete review of systems is negative.  PAST MEDICAL HISTORY: Past Medical History:  Diagnosis Date  . COPD (chronic obstructive pulmonary disease) ([place])   . GU (gastric peptic ulcer)   . Lung cancer ([place])   . Malignant pleural effusion   . PAF (paroxysmal atrial  fibrillation) ([place]) 10/2016   a. 10/2016 post thoracentesis ; b. 10/2016 Echo: EF 65-70%, mild LVH;  c. Amio/Eliquis initiated (CHA2DSVASc = 3).  . Pneumonia   . Wrist fracture 1993   bilateral    PAST SURGICAL HISTORY: Past Surgical History:  Procedure Laterality Date  . ABDOMINAL HYSTERECTOMY    . CATARACT EXTRACTION W/ INTRAOCULAR LENS  IMPLANT, BILATERAL    . CHEST TUBE INSERTION Left 12/18/2016   Procedure: CHEST TUBE INSERTION;  Surgeon: [name], MD;  Location: [facility] ORS;  Service: General;  Laterality: Left;  . CHOLECYSTECTOMY    . CLOSED REDUCTION WRIST FRACTURE Left 01/04/2017   Procedure: CLOSED REDUCTION WRIST;  Surgeon: Thornton Park, MD;  Location: [facility] ORS;  Service: Orthopedics;  Laterality: Left;  . PORTACATH PLACEMENT Left 12/18/2016   Procedure: INSERTION PORT-A-CATH;  Surgeon: [name], MD;  Location: [facility] ORS;  Service: General;  Laterality: Left;  . REMOVAL OF PLEURAL DRAINAGE CATHETER N/A 01/02/2017   Procedure: REMOVAL OF PLEURAL DRAINAGE CATHETER;  Surgeon: [name], MD;  Location: [facility] ORS;  Service: Thoracic;  Laterality: N/A;  . TONSILLECTOMY      FAMILY HISTORY: Family History  Problem Relation Age of Onset  . Hypertension Father   . Heart disease Father   . Heart attack Father   . Congestive Heart Failure Mother   . Stroke Mother   . Atrial fibrillation Sister     ADVANCED DIRECTIVES (Y/N):  N  HEALTH MAINTENANCE: Social History   Tobacco Use  . Smoking status: Former Smoker    Packs/day: 1.00    Years: 68.00    Pack years: 68.00    Last  attempt to quit: 11/06/2016    Years since quitting: 0.5  . Smokeless tobacco: Never Used  Substance Use Topics  . Alcohol use: No  . Drug use: No     Colonoscopy:  PAP:  Bone density:  Lipid panel:  Allergies  Allergen Reactions  . Sulfa Antibiotics Other (See Comments)    Seizures   . Levaquin [Levofloxacin] Hives and Itching    Current Outpatient Medications  Medication  Sig Dispense Refill  . albuterol (PROVENTIL) (2.5 MG/3ML) 0.083% nebulizer solution Take 3 mLs (2.5 mg total) by nebulization 2 (two) times daily. 75 mL 12  . amiodarone (PACERONE) 200 MG tablet Take 100 mg by mouth daily.     Marland Kitchen apixaban (ELIQUIS) 5 MG TABS tablet Take 1 tablet (5 mg total) by mouth 2 (two) times daily. 60 tablet 0  . BIOTIN PO Take by mouth daily.    . cetirizine (ZYRTEC) 10 MG tablet Take 10 mg by mouth daily as needed for allergies.    . cholecalciferol (VITAMIN D) 1000 units tablet Take 1,000 Units by mouth daily.    Marland Kitchen diltiazem (CARDIZEM CD) 120 MG 24 hr capsule Take 1 capsule (120 mg total) by mouth daily.    . diphenoxylate-atropine (LOMOTIL) 2.5-0.025 MG tablet Take 1 tablet 4 (four) times daily as needed by mouth for diarrhea or loose stools. 120 tablet 0  . furosemide (LASIX) 20 MG tablet Take 1 tablet by mouth daily as needed. For EDEMA  3  . HYDROcodone-acetaminophen (NORCO) 5-325 MG tablet Take 1 tablet by mouth every 4 (four) hours as needed for moderate pain. 30 tablet 0  . Multiple Vitamins-Minerals (MULTIVITAMIN ADULT PO) Take by mouth.    Marland Kitchen omeprazole (PRILOSEC) 20 MG capsule Take 1 capsule (20 mg total) by mouth daily. 90 capsule 2  . ondansetron (ZOFRAN) 8 MG tablet TAKE 1 TABLET TWO TIMES DAILY AS NEEDED FOR REFRACTORY NAUSEA OR VOMITING.  120 tablet 0  . potassium chloride SA (K-DUR,KLOR-CON) 20 MEQ tablet Take 20 mEq 2 (two) times daily by mouth.    . prochlorperazine (COMPAZINE) 10 MG tablet Take 1 tablet (10 mg total) by mouth every 6 (six) hours as needed (Nausea or vomiting). 60 tablet 2   No current facility-administered medications for this visit.     OBJECTIVE: Vitals:   05/09/17 1043  BP: 134/77  Pulse: 69  Resp: 18  Temp: 97.9 F (36.6 C)  SpO2: 97%     Body mass index is 27.81 kg/m.    ECOG FS:0 - Asymptomatic  General: Well-developed, well-nourished, no acute distress. Eyes: Pink conjunctiva, anicteric sclera. Lungs: Clear to  auscultation bilaterally. Heart: Regular rate and rhythm. No rubs, murmurs, or gallops. Abdomen: Soft, nontender, nondistended. No organomegaly noted, normoactive bowel sounds. Musculoskeletal: No edema, cyanosis, or clubbing. Left arm in cast. Neuro: Alert, answering all questions appropriately. Cranial nerves grossly intact. Skin: Maculopapular rash noted, Improved. Psych: Normal affect.    LAB RESULTS:  Lab Results  Component Value Date   NA 140 05/09/2017   K 3.7 05/09/2017   CL 106 05/09/2017   CO2 26 05/09/2017   GLUCOSE 112 (H) 05/09/2017   BUN 18 05/09/2017   CREATININE 0.88 05/09/2017   CALCIUM 8.9 05/09/2017   PROT 6.9 05/09/2017   ALBUMIN 3.3 (L) 05/09/2017   AST 16 05/09/2017   ALT 11 (L) 05/09/2017   ALKPHOS 90 05/09/2017   BILITOT 0.4 05/09/2017   GFRNONAA 59 (L) 05/09/2017   GFRAA >60 05/09/2017    Lab  Results  Component Value Date   WBC 13.3 (H) 05/09/2017   NEUTROABS 9.5 (H) 05/09/2017   HGB 10.3 (L) 05/09/2017   HCT 32.0 (L) 05/09/2017   MCV 81.5 05/09/2017   PLT 240 05/09/2017     STUDIES: Nm Pet Image Restag (ps) Skull Base To Thigh  Result Date: 05/04/2017 CLINICAL DATA:  Subsequent treatment strategy for squamous cell carcinoma of the left lung. Current immunotherapy. EXAM: NUCLEAR MEDICINE PET SKULL BASE TO THIGH TECHNIQUE: 12.4 mCi F-18 FDG was injected intravenously. Full-ring PET imaging was performed from the skull base to thigh after the radiotracer. CT data was obtained and used for attenuation correction and anatomic localization. FASTING BLOOD GLUCOSE:  Value: 104 mg/dl COMPARISON:  Multiple exams, including 11/29/2016 FINDINGS: NECK No hypermetabolic lymph nodes in the neck. Chronic right maxillary sinusitis. CHEST A mass or lymph node anterior to the descending thoracic aorta measures 2.4 cm in short axis on image 95/4 (formerly 1.7 cm) with maximum SUV 37.5 (formerly 15.5). A pleural-based mass invading the left fourth rib anteriorly and  extending into the chest wall measures 4.0 by 3.2 cm on image 92/4 (previously pleural-based and measuring 1.9 by 0.8 cm). Maximum SUV is 15.3 (formerly 8.9). Nodule or lymph node inseparable from the left infrahilar vascular structures has a maximum SUV of 8.3 (formerly 10.4). There is peripheral airspace opacity in left lower lobe with some mild accentuation of activity. Accentuated activity along the left pleural space suspicious for left malignant pleural effusion. Faintly hypermetabolic left axillary and AP window lymph nodes are observed. A right lower paratracheal node measures 1.9 cm in short axis on image 71/4 (formerly 1.3 cm) with maximum SUV 3.0 today. Mild diffuse interstitial accentuation. Coronary, aortic arch, and branch vessel atherosclerotic vascular disease. Left Port-A-Cath tip: Brachiocephalic vein. Left posterior thyroid nodule 2.0 cm in long axis, not currently hypermetabolic. ABDOMEN/PELVIS No abnormal hypermetabolic activity within the liver, pancreas, adrenal glands, or spleen. No hypermetabolic lymph nodes in the abdomen or pelvis. Small bilateral adrenal adenomas. Prior cholecystectomy. Aortoiliac atherosclerotic vascular disease. Left mid kidney peripelvic cyst. Sigmoid diverticulosis. SKELETON Deformity of the left tenth rib posterolaterally along a 2.3 cm segment corresponding to prior lytic lesion. Maximum SUV at this site 3.1, previously 12.3. The pleural-based lesion invading the left fourth rib is worsened as noted in the chest section above. A small subcutaneous lesion in the right upper lateral thigh is triangular in shape and has a maximum SUV of 2.3, previously 2.9. IMPRESSION: 1. Mixed appearance. Some of the previous lesions are significantly improved, such as the left posteromedial pleural mass; the left pleural mass which previously invaded the left tenth rib; and left infrahilar mass. However, other lesions are markedly worsened, including the pleural based lesion now  invading the left anterior fourth rib, and the lower periaortic node which is greatly increased in size and maximum SUV. 2. Accentuated activity along the trace left pleural effusion suspicious for malignant effusion. 3. Small essentially stable focus of subcutaneous accentuated metabolic activity in the right upper lateral thigh, probably benign. Assuming that this is indeed benign, there no findings of extra thoracic metastatic disease. 4. Other imaging findings of potential clinical significance: Chronic right maxillary sinusitis. Mild diffuse nonspecific interstitial accentuation in the lungs. Aortic Atherosclerosis (ICD10-I70.0). Coronary atherosclerosis. Benign-appearing left thyroid nodule. Sigmoid diverticulosis. Small bilateral adrenal adenomas. Electronically Signed   By: Van Clines M.D.   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  PLAN:    1. 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  Overall she has improvement of disease burden, but the pleural-based lesion in her left anterior fourth rib appears to be worse.  Will discuss further at cancer conference tomorrow.  Patient refused an MRI of the brain, but did have a head CT on January 02, 2017 that did not report metastatic disease. Treatment was initially delayed secondary to her broken arm.  Given the multiple problems she has had with chemotherapy treatment, including declining performance status, this was discontinued and proceeded with second line Keytruda.  Patient will receive this every 3 weeks until intolerable side effects or progression of disease. Proceed with cycle 5 of single agent Keytruda.  Return to clinic in 3 weeks for consideration of cycle 6.   2. Broken arm: Continue follow-up with orthopedics as scheduled.  Patient reports that she likely does not require surgery. 3. Pain:  Patient does not complain of this today.  Continue current narcotic regimen. 4.  Anemia: Mild, monitor.  Approximately 30 minutes was spent in discussion of which greater than 50% was consultation.  Patient expressed understanding and was in agreement with this plan. She also understands that She can call clinic at any time with any questions, concerns, or complaints.   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  INTERVAL HISTORY: Patient returns to clinic today for further evaluation and consideration of cycle 4 of Keytruda.  She is tolerating her treatments well without significant side effects.  She currently feels well and is asymptomatic.  She has a good appetite and no longer complains of weight loss. She does not complain of weakness or fatigue today. She has no neurologic complaints. She does not complain of rib or arm pain today. She has no chest pain, shortness of breath, cough, or hemoptysis. She denies any nausea, vomiting, constipation, or diarrhea. She has no urinary complaints. Patient offers no specific complaints today.  REVIEW OF SYSTEMS:   Review of Systems  Constitutional: Negative.  Negative for fever, malaise/fatigue and weight loss.  Respiratory: Negative.  Negative for cough and shortness of breath.   Cardiovascular: Negative.  Negative for chest pain and leg swelling.  Gastrointestinal: Negative.  Negative for abdominal pain and  diarrhea.  Genitourinary: Negative.   Musculoskeletal: Negative.  Negative for back pain, falls and joint pain.  Skin: Negative.  Negative for itching and rash.  Neurological: Negative.  Negative for sensory change and weakness.  Psychiatric/Behavioral: Negative.  The patient is not nervous/anxious.     As per HPI. Otherwise, a complete review of systems is negative.  PAST MEDICAL HISTORY: Past Medical History:  Diagnosis Date  . COPD (chronic obstructive pulmonary disease) ([place])   . GU (gastric peptic ulcer)   . Lung cancer ([place])   . Malignant pleural effusion   . PAF (paroxysmal atrial fibrillation) ([place]) 10/2016  a. 10/2016 post thoracentesis ; b. 10/2016 Echo: EF 65-70%, mild LVH;  c. Amio/Eliquis initiated (CHA2DSVASc = 3).  . Pneumonia   . Wrist fracture 1993   bilateral    PAST SURGICAL HISTORY: Past Surgical History:  Procedure Laterality Date  . ABDOMINAL HYSTERECTOMY    . CATARACT EXTRACTION W/ INTRAOCULAR LENS  IMPLANT, BILATERAL    . CHEST TUBE INSERTION Left 12/18/2016   Procedure: CHEST TUBE INSERTION;  Surgeon: [name], MD;  Location: [facility] ORS;  Service: General;  Laterality: Left;  . CHOLECYSTECTOMY    . CLOSED REDUCTION WRIST FRACTURE Left 01/04/2017   Procedure: CLOSED REDUCTION WRIST;  Surgeon: Thornton Park, MD;  Location: [facility] ORS;  Service: Orthopedics;  Laterality: Left;  . PORTACATH PLACEMENT Left 12/18/2016   Procedure: INSERTION PORT-A-CATH;  Surgeon: [name], MD;  Location: [facility] ORS;  Service: General;  Laterality: Left;  . REMOVAL OF PLEURAL DRAINAGE CATHETER N/A 01/02/2017   Procedure: REMOVAL OF PLEURAL DRAINAGE CATHETER;  Surgeon: [name], MD;  Location: [facility] ORS;  Service: Thoracic;  Laterality: N/A;  . TONSILLECTOMY      FAMILY HISTORY: Family History  Problem Relation Age of Onset  . Hypertension Father   . Heart disease Father   . Heart attack Father   . Congestive Heart Failure Mother   . Stroke Mother   . Atrial  fibrillation Sister     ADVANCED DIRECTIVES (Y/N):  N  HEALTH MAINTENANCE: Social History   Tobacco Use  . Smoking status: Former Smoker    Packs/day: 1.00    Years: 68.00    Pack years: 68.00    Last attempt to quit: 11/06/2016    Years since quitting: 0.5  . Smokeless tobacco: Never Used  Substance Use Topics  . Alcohol use: No  . Drug use: No     Colonoscopy:  PAP:  Bone density:  Lipid panel:  Allergies  Allergen Reactions  . Sulfa Antibiotics Other (See Comments)    Seizures   . Levaquin [Levofloxacin] Hives and Itching    Current Outpatient Medications  Medication Sig Dispense Refill  . albuterol (PROVENTIL) (2.5 MG/3ML) 0.083% nebulizer solution Take 3 mLs (2.5 mg total) by nebulization 2 (two) times daily. 75 mL 12  . amiodarone (PACERONE) 200 MG tablet Take 100 mg by mouth daily.     Marland Kitchen apixaban (ELIQUIS) 5 MG TABS tablet Take 1 tablet (5 mg total) by mouth 2 (two) times daily. 60 tablet 0  . BIOTIN PO Take by mouth daily.    . cetirizine (ZYRTEC) 10 MG tablet Take 10 mg by mouth daily as needed for allergies.    . cholecalciferol (VITAMIN D) 1000 units tablet Take 1,000 Units by mouth daily.    Marland Kitchen diltiazem (CARDIZEM CD) 120 MG 24 hr capsule Take 1 capsule (120 mg total) by mouth daily.    . diphenoxylate-atropine (LOMOTIL) 2.5-0.025 MG tablet Take 1 tablet 4 (four) times daily as needed by mouth for diarrhea or loose stools. 120 tablet 0  . furosemide (LASIX) 20 MG tablet Take 1 tablet by mouth daily as needed. For EDEMA  3  . HYDROcodone-acetaminophen (NORCO) 5-325 MG tablet Take 1 tablet by mouth every 4 (four) hours as needed for moderate pain. 30 tablet 0  . Multiple Vitamins-Minerals (MULTIVITAMIN ADULT PO) Take by mouth.    Marland Kitchen omeprazole (PRILOSEC) 20 MG capsule Take 1 capsule (20 mg total) by mouth daily. 90 capsule 2  . ondansetron (ZOFRAN) 8 MG tablet TAKE 1 TABLET TWO TIMES DAILY  AS NEEDED FOR REFRACTORY NAUSEA OR VOMITING.  120 tablet 0  . potassium  chloride SA (K-DUR,KLOR-CON) 20 MEQ tablet Take 20 mEq 2 (two) times daily by mouth.    . prochlorperazine (COMPAZINE) 10 MG tablet Take 1 tablet (10 mg total) by mouth every 6 (six) hours as needed (Nausea or vomiting). 60 tablet 2   No current facility-administered medications for this visit.     OBJECTIVE: Vitals:   05/09/17 1043  BP: 134/77  Pulse: 69  Resp: 18  Temp: 97.9 F (36.6 C)  SpO2: 97%     Body mass index is 27.81 kg/m.    ECOG FS:1 - Symptomatic but completely ambulatory  General: Well-developed, well-nourished, no acute distress. Eyes: Pink conjunctiva, anicteric sclera. Lungs: Clear to auscultation bilaterally. Heart: Regular rate and rhythm. No rubs, murmurs, or gallops. Abdomen: Soft, nontender, nondistended. No organomegaly noted, normoactive bowel sounds. Musculoskeletal: No edema, cyanosis, or clubbing. Left arm in cast. Neuro: Alert, answering all questions appropriately. Cranial nerves grossly intact. Skin: Maculopapular rash noted, Improved. Psych: Normal affect.    LAB RESULTS:  Lab Results  Component Value Date   NA 140 05/09/2017   K 3.7 05/09/2017   CL 106 05/09/2017   CO2 26 05/09/2017   GLUCOSE 112 (H) 05/09/2017   BUN 18 05/09/2017   CREATININE 0.88 05/09/2017   CALCIUM 8.9 05/09/2017   PROT 6.9 05/09/2017   ALBUMIN 3.3 (L) 05/09/2017   AST 16 05/09/2017   ALT 11 (L) 05/09/2017   ALKPHOS 90 05/09/2017   BILITOT 0.4 05/09/2017   GFRNONAA 59 (L) 05/09/2017   GFRAA >60 05/09/2017    Lab Results  Component Value Date   WBC 13.3 (H) 05/09/2017   NEUTROABS 9.5 (H) 05/09/2017   HGB 10.3 (L) 05/09/2017   HCT 32.0 (L) 05/09/2017   MCV 81.5 05/09/2017   PLT 240 05/09/2017     STUDIES: Nm Pet Image Restag (ps) Skull Base To Thigh  Result Date: 05/04/2017 CLINICAL DATA:  Subsequent treatment strategy for squamous cell carcinoma of the left lung. Current immunotherapy. EXAM: NUCLEAR MEDICINE PET SKULL BASE TO THIGH TECHNIQUE: 12.4  mCi F-18 FDG was injected intravenously. Full-ring PET imaging was performed from the skull base to thigh after the radiotracer. CT data was obtained and used for attenuation correction and anatomic localization. FASTING BLOOD GLUCOSE:  Value: 104 mg/dl COMPARISON:  Multiple exams, including 11/29/2016 FINDINGS: NECK No hypermetabolic lymph nodes in the neck. Chronic right maxillary sinusitis. CHEST A mass or lymph node anterior to the descending thoracic aorta measures 2.4 cm in short axis on image 95/4 (formerly 1.7 cm) with maximum SUV 37.5 (formerly 15.5). A pleural-based mass invading the left fourth rib anteriorly and extending into the chest wall measures 4.0 by 3.2 cm on image 92/4 (previously pleural-based and measuring 1.9 by 0.8 cm). Maximum SUV is 15.3 (formerly 8.9). Nodule or lymph node inseparable from the left infrahilar vascular structures has a maximum SUV of 8.3 (formerly 10.4). There is peripheral airspace opacity in left lower lobe with some mild accentuation of activity. Accentuated activity along the left pleural space suspicious for left malignant pleural effusion. Faintly hypermetabolic left axillary and AP window lymph nodes are observed. A right lower paratracheal node measures 1.9 cm in short axis on image 71/4 (formerly 1.3 cm) with maximum SUV 3.0 today. Mild diffuse interstitial accentuation. Coronary, aortic arch, and branch vessel atherosclerotic vascular disease. Left Port-A-Cath tip: Brachiocephalic vein. Left posterior thyroid nodule 2.0 cm in long axis, not currently hypermetabolic.  ABDOMEN/PELVIS No abnormal hypermetabolic activity within the liver, pancreas, adrenal glands, or spleen. No hypermetabolic lymph nodes in the abdomen or pelvis. Small bilateral adrenal adenomas. Prior cholecystectomy. Aortoiliac atherosclerotic vascular disease. Left mid kidney peripelvic cyst. Sigmoid diverticulosis. SKELETON Deformity of the left tenth rib posterolaterally along a 2.3 cm segment  corresponding to prior lytic lesion. Maximum SUV at this site 3.1, previously 12.3. The pleural-based lesion invading the left fourth rib is worsened as noted in the chest section above. A small subcutaneous lesion in the right upper lateral thigh is triangular in shape and has a maximum SUV of 2.3, previously 2.9. IMPRESSION: 1. Mixed appearance. Some of the previous lesions are significantly improved, such as the left posteromedial pleural mass; the left pleural mass which previously invaded the left tenth rib; and left infrahilar mass. However, other lesions are markedly worsened, including the pleural based lesion now invading the left anterior fourth rib, and the lower periaortic node which is greatly increased in size and maximum SUV. 2. Accentuated activity along the trace left pleural effusion suspicious for malignant effusion. 3. Small essentially stable focus of subcutaneous accentuated metabolic activity in the right upper lateral thigh, probably benign. Assuming that this is indeed benign, there no findings of extra thoracic metastatic disease. 4. Other imaging findings of potential clinical significance: Chronic right maxillary sinusitis. Mild diffuse nonspecific interstitial accentuation in the lungs. Aortic Atherosclerosis (ICD10-I70.0). Coronary atherosclerosis. Benign-appearing left thyroid nodule. Sigmoid diverticulosis. Small bilateral adrenal adenomas. Electronically Signed   By: Van Clines M.D.   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  PLAN:    1. 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 Patient refused an MRI of the brain, but did have a head CT on January 02, 2017 that did not report metastatic disease. Treatment was initially delayed secondary to her broken arm.  Given the multiple problems she has had with chemotherapy treatment,  including declining performance status, this was discontinued and proceeded with second line Keytruda.  Patient will receive this every 3 weeks until intolerable side effects or progression of disease. Proceed with cycle 4 of single agent Keytruda.  Return to clinic in 3 weeks for consideration of cycle 5.  Will reimage with PET scan prior to next treatment. 2. Broken arm: Continue follow-up with orthopedics as scheduled.  Patient reports that she likely does not require surgery. 3. Pain: Patient does not complain of this today.  Continue current narcotic regimen. 4.  Diarrhea: Resolved.  C. difficile is negative. Continue Lomotil as needed.  5. Hyponatremia: Resolved. 6. Hypokalemia: Resolved. Continue oral potassium supplementation.   7.  Anemia: Mild, monitor.   Patient expressed understanding and was in agreement with this plan. She also understands that She can call clinic at any time with any questions, concerns, or complaints.   Cancer Staging Squamous cell carcinoma lung, left ([place]) Staging form: Lung, AJCC 8th Edition - Clinical stage from 12/13/2016: Stage IVA (cT4, cN0, cM1a) - Signed by [name], MD on 12/13/2016   [name], MD   05/09/2017 1:10 PM

## 2017-05-09 ENCOUNTER — Inpatient Hospital Stay: Payer: Medicare HMO | Attending: Oncology

## 2017-05-09 ENCOUNTER — Inpatient Hospital Stay (HOSPITAL_BASED_OUTPATIENT_CLINIC_OR_DEPARTMENT_OTHER): Payer: Medicare HMO | Admitting: Oncology

## 2017-05-09 ENCOUNTER — Inpatient Hospital Stay: Payer: Medicare HMO

## 2017-05-09 ENCOUNTER — Encounter: Payer: Self-pay | Admitting: Oncology

## 2017-05-09 VITALS — BP 134/77 | HR 69 | Temp 97.9°F | Resp 18 | Wt 157.0 lb

## 2017-05-09 DIAGNOSIS — Z87891 Personal history of nicotine dependence: Secondary | ICD-10-CM | POA: Diagnosis not present

## 2017-05-09 DIAGNOSIS — I700 Atherosclerosis of aorta: Secondary | ICD-10-CM | POA: Diagnosis not present

## 2017-05-09 DIAGNOSIS — Z5112 Encounter for antineoplastic immunotherapy: Secondary | ICD-10-CM | POA: Diagnosis not present

## 2017-05-09 DIAGNOSIS — C3492 Malignant neoplasm of unspecified part of left bronchus or lung: Secondary | ICD-10-CM

## 2017-05-09 DIAGNOSIS — E041 Nontoxic single thyroid nodule: Secondary | ICD-10-CM

## 2017-05-09 DIAGNOSIS — Z79899 Other long term (current) drug therapy: Secondary | ICD-10-CM

## 2017-05-09 DIAGNOSIS — K5730 Diverticulosis of large intestine without perforation or abscess without bleeding: Secondary | ICD-10-CM

## 2017-05-09 DIAGNOSIS — D3501 Benign neoplasm of right adrenal gland: Secondary | ICD-10-CM

## 2017-05-09 DIAGNOSIS — J320 Chronic maxillary sinusitis: Secondary | ICD-10-CM

## 2017-05-09 DIAGNOSIS — D649 Anemia, unspecified: Secondary | ICD-10-CM | POA: Insufficient documentation

## 2017-05-09 LAB — CBC WITH DIFFERENTIAL/PLATELET
BASOS ABS: 0.1 10*3/uL (ref 0–0.1)
BASOS PCT: 1 %
Eosinophils Absolute: 1.4 10*3/uL — ABNORMAL HIGH (ref 0–0.7)
Eosinophils Relative: 10 %
HEMATOCRIT: 32 % — AB (ref 35.0–47.0)
Hemoglobin: 10.3 g/dL — ABNORMAL LOW (ref 12.0–16.0)
LYMPHS PCT: 12 %
Lymphs Abs: 1.6 10*3/uL (ref 1.0–3.6)
MCH: 26.1 pg (ref 26.0–34.0)
MCHC: 32.1 g/dL (ref 32.0–36.0)
MCV: 81.5 fL (ref 80.0–100.0)
MONO ABS: 0.8 10*3/uL (ref 0.2–0.9)
Monocytes Relative: 6 %
NEUTROS ABS: 9.5 10*3/uL — AB (ref 1.4–6.5)
NEUTROS PCT: 71 %
PLATELETS: 240 10*3/uL (ref 150–440)
RBC: 3.92 MIL/uL (ref 3.80–5.20)
RDW: 15.7 % — AB (ref 11.5–14.5)
WBC: 13.3 10*3/uL — ABNORMAL HIGH (ref 3.6–11.0)

## 2017-05-09 LAB — COMPREHENSIVE METABOLIC PANEL
ALBUMIN: 3.3 g/dL — AB (ref 3.5–5.0)
ALK PHOS: 90 U/L (ref 38–126)
ALT: 11 U/L — ABNORMAL LOW (ref 14–54)
ANION GAP: 8 (ref 5–15)
AST: 16 U/L (ref 15–41)
BILIRUBIN TOTAL: 0.4 mg/dL (ref 0.3–1.2)
BUN: 18 mg/dL (ref 6–20)
CALCIUM: 8.9 mg/dL (ref 8.9–10.3)
CO2: 26 mmol/L (ref 22–32)
Chloride: 106 mmol/L (ref 101–111)
Creatinine, Ser: 0.88 mg/dL (ref 0.44–1.00)
GFR, EST NON AFRICAN AMERICAN: 59 mL/min — AB (ref >60)
Glucose, Bld: 112 mg/dL — ABNORMAL HIGH (ref 65–99)
POTASSIUM: 3.7 mmol/L (ref 3.5–5.1)
Sodium: 140 mmol/L (ref 135–145)
TOTAL PROTEIN: 6.9 g/dL (ref 6.5–8.1)

## 2017-05-09 MED ORDER — HEPARIN SOD (PORK) LOCK FLUSH 100 UNIT/ML IV SOLN
500.0000 [IU] | Freq: Once | INTRAVENOUS | Status: AC
  Administered 2017-05-09: 500 [IU] via INTRAVENOUS

## 2017-05-09 MED ORDER — SODIUM CHLORIDE 0.9 % IV SOLN
200.0000 mg | Freq: Once | INTRAVENOUS | Status: AC
  Administered 2017-05-09: 200 mg via INTRAVENOUS
  Filled 2017-05-09: qty 8

## 2017-05-09 MED ORDER — SODIUM CHLORIDE 0.9 % IV SOLN
Freq: Once | INTRAVENOUS | Status: AC
  Administered 2017-05-09: 11:00:00 via INTRAVENOUS
  Filled 2017-05-09: qty 1000

## 2017-05-10 LAB — THYROID PANEL WITH TSH
FREE THYROXINE INDEX: 3 (ref 1.2–4.9)
T3 UPTAKE RATIO: 32 % (ref 24–39)
T4 TOTAL: 9.5 ug/dL (ref 4.5–12.0)
TSH: 2.46 u[IU]/mL (ref 0.450–4.500)

## 2017-05-15 ENCOUNTER — Encounter: Payer: Self-pay | Admitting: Oncology

## 2017-05-25 ENCOUNTER — Other Ambulatory Visit: Payer: Self-pay

## 2017-05-25 ENCOUNTER — Ambulatory Visit
Admission: RE | Admit: 2017-05-25 | Discharge: 2017-05-25 | Disposition: A | Discharge status: Home / Self Care | Payer: Medicare HMO | Source: Ambulatory Visit | Attending: Radiation Oncology | Admitting: Radiation Oncology

## 2017-05-25 ENCOUNTER — Encounter: Payer: Self-pay | Admitting: Radiation Oncology

## 2017-05-25 VITALS — BP 128/74 | HR 70 | Temp 98.0°F | Resp 18 | Wt 158.2 lb

## 2017-05-25 DIAGNOSIS — C3492 Malignant neoplasm of unspecified part of left bronchus or lung: Secondary | ICD-10-CM

## 2017-05-25 NOTE — Progress Notes (Signed)
Radiation Oncology old patient new area Follow up Note  Name: [name]   Date:   05/25/2017 MRN:  [id number] DOB: May 12, 1932    This 84 y.o. female presents to the clinic today for reevaluation inpatient stage IV lung cancer previously treated to single fraction palliative treatment to her left ribs for stage IV lung cancer.  REFERRING PROVIDER: Albina Billet, MD  HPI: Patient is an 84 year old female received single fraction radiation therapy to her left ribs for palliation approximately 4 months prior.. She is currently on Keytruda and is asymptomatic as far as her stage IV lung cancer is concerned she specifically denies rib pain cough or hemoptysis. Unfortunately recent PET CT scan demonstrated progressive disease in the area of the left fourth rib as well as lower periaortic lymph node. I've asked to evaluate the patient for possible palliative treatment to these 2 areas. She's having no dysphagia she states her appetite is good no problems with her weight.  COMPLICATIONS OF TREATMENT: none  FOLLOW UP COMPLIANCE: keeps appointments   PHYSICAL EXAM:  BP 128/74   Pulse 70   Temp 98 F (36.7 C)   Resp 18   Wt 158 lb 2.9 oz (71.7 kg)   BMI 28.02 kg/m  Well-developed well-nourished patient in NAD. HEENT reveals PERLA, EOMI, discs not visualized.  Oral cavity is clear. No oral mucosal lesions are identified. Neck is clear without evidence of cervical or supraclavicular adenopathy. Lungs are clear to A&P. Cardiac examination is essentially unremarkable with regular rate and rhythm without murmur rub or thrill. Abdomen is benign with no organomegaly or masses noted. Motor sensory and DTR levels are equal and symmetric in the upper and lower extremities. Cranial nerves II through XII are grossly intact. Proprioception is intact. No peripheral adenopathy or edema is identified. No motor or sensory levels are noted. Crude visual fields are within normal range.  RADIOLOGY RESULTS: PET CT scan  reviewed and compatible with the above-stated findings  PLAN: At this time I to include both treatments and a single course of palliative radiation therapy. I would plan on delivering 4000 cGy over 4 weeks possibly using I am RT treatment planning and delivery to incorporate both areas of involvement. Risks and benefits of treatment including skin reaction fatigue possible radiation esophagitis and inclusion of normal lung volume all were discussed in detail with the patient and her daughter. I personally set up and ordered CT simulation in about 2 weeks since the patient is desired to go on vacation shortly after that. Patient will continue her immunotherapy with Keytruda.There will be extra effort by both professional staff as well as technical staff to coordinate and manage concurrent chemoradiation and ensuing side effects during her treatments.  I would like to take this opportunity to thank you for allowing me to participate in the care of your patient.Noreene Filbert, MD

## 2017-05-27 NOTE — Progress Notes (Signed)
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  INTERVAL HISTORY: Patient returns to clinic today for further evaluation and consideration of cycle 6 of Keytruda.  She has not initiated palliative XRT.  She continues to tolerate her treatments well without significant side effects.  She currently feels well and is asymptomatic.  She has a good appetite and is gaining weight. She does not complain of weakness or fatigue today. She has no neurologic complaints. She does not complain of rib or arm pain today. She has no chest pain, shortness of breath, cough, or hemoptysis. She denies any nausea, vomiting, constipation, or diarrhea. She has no urinary complaints. Patient offers no specific complaints today.  REVIEW OF SYSTEMS:   Review of Systems  Constitutional: Negative.  Negative for fever, malaise/fatigue and weight loss.  Respiratory: Negative.  Negative for cough and shortness of breath.   Cardiovascular: Negative.  Negative for chest pain and leg swelling.  Gastrointestinal: Negative.  Negative for abdominal pain and diarrhea.  Genitourinary: Negative.   Musculoskeletal: Negative.  Negative for back pain, falls and joint pain.  Skin: Negative.  Negative for itching and rash.  Neurological: Negative.  Negative for sensory change and weakness.  Psychiatric/Behavioral: Negative.  The patient is not nervous/anxious.     As per HPI. Otherwise, a complete review of systems is negative.  PAST MEDICAL HISTORY: Past Medical History:  Diagnosis Date  . COPD (chronic obstructive pulmonary disease) ([place])   . GU (gastric peptic ulcer)   . Lung cancer ([place])   . Malignant pleural effusion   . PAF (paroxysmal atrial  fibrillation) ([place]) 10/2016   a. 10/2016 post thoracentesis ; b. 10/2016 Echo: EF 65-70%, mild LVH;  c. Amio/Eliquis initiated (CHA2DSVASc = 3).  . Pneumonia   . Wrist fracture 1993   bilateral    PAST SURGICAL HISTORY: Past Surgical History:  Procedure Laterality Date  . ABDOMINAL HYSTERECTOMY    . CATARACT EXTRACTION W/ INTRAOCULAR LENS  IMPLANT, BILATERAL    . CHEST TUBE INSERTION Left 12/18/2016   Procedure: CHEST TUBE INSERTION;  Surgeon: [name], MD;  Location: [facility] ORS;  Service: General;  Laterality: Left;  . CHOLECYSTECTOMY    . CLOSED REDUCTION WRIST FRACTURE Left 01/04/2017   Procedure: CLOSED REDUCTION WRIST;  Surgeon: Thornton Park, MD;  Location: [facility] ORS;  Service: Orthopedics;  Laterality: Left;  . PORTACATH PLACEMENT Left 12/18/2016   Procedure: INSERTION PORT-A-CATH;  Surgeon: [name], MD;  Location: [facility] ORS;  Service: General;  Laterality: Left;  . REMOVAL OF PLEURAL DRAINAGE CATHETER N/A 01/02/2017   Procedure: REMOVAL OF PLEURAL DRAINAGE CATHETER;  Surgeon: [name], MD;  Location: [facility] ORS;  Service: Thoracic;  Laterality: N/A;  . TONSILLECTOMY      FAMILY HISTORY: Family History  Problem Relation Age of Onset  . Hypertension Father   . Heart disease Father   . Heart attack Father   . Congestive Heart Failure Mother   . Stroke Mother   . Atrial fibrillation Sister     ADVANCED DIRECTIVES (Y/N):  N  HEALTH MAINTENANCE: Social History   Tobacco Use  . Smoking status: Former Smoker    Packs/day: 1.00    Years: 68.00    Pack years: 68.00  Last attempt to quit: 11/06/2016    Years since quitting: 0.5  . Smokeless tobacco: Never Used  Substance Use Topics  . Alcohol use: No  . Drug use: No     Colonoscopy:  PAP:  Bone density:  Lipid panel:  Allergies  Allergen Reactions  . Sulfa Antibiotics Other (See Comments)    Seizures   . Levaquin [Levofloxacin] Hives and Itching    Current Outpatient Medications  Medication  Sig Dispense Refill  . albuterol (PROVENTIL) (2.5 MG/3ML) 0.083% nebulizer solution Take 3 mLs (2.5 mg total) by nebulization 2 (two) times daily. 75 mL 12  . apixaban (ELIQUIS) 5 MG TABS tablet Take 1 tablet (5 mg total) by mouth 2 (two) times daily. 60 tablet 0  . BIOTIN PO Take by mouth daily.    . cetirizine (ZYRTEC) 10 MG tablet Take 10 mg by mouth daily as needed for allergies.    . cholecalciferol (VITAMIN D) 1000 units tablet Take 1,000 Units by mouth daily.    Marland Kitchen diltiazem (CARDIZEM CD) 120 MG 24 hr capsule Take 1 capsule (120 mg total) by mouth daily.    . diphenoxylate-atropine (LOMOTIL) 2.5-0.025 MG tablet Take 1 tablet 4 (four) times daily as needed by mouth for diarrhea or loose stools. 120 tablet 0  . furosemide (LASIX) 20 MG tablet Take 1 tablet by mouth daily as needed. For EDEMA  3  . HYDROcodone-acetaminophen (NORCO) 5-325 MG tablet Take 1 tablet by mouth every 4 (four) hours as needed for moderate pain. 30 tablet 0  . Multiple Vitamins-Minerals (MULTIVITAMIN ADULT PO) Take by mouth.    Marland Kitchen omeprazole (PRILOSEC) 20 MG capsule Take 1 capsule (20 mg total) by mouth daily. 90 capsule 2  . ondansetron (ZOFRAN) 8 MG tablet TAKE 1 TABLET TWO TIMES DAILY AS NEEDED FOR REFRACTORY NAUSEA OR VOMITING.  120 tablet 0  . potassium chloride SA (K-DUR,KLOR-CON) 20 MEQ tablet Take 20 mEq 2 (two) times daily by mouth.    . prochlorperazine (COMPAZINE) 10 MG tablet Take 1 tablet (10 mg total) by mouth every 6 (six) hours as needed (Nausea or vomiting). 60 tablet 2   No current facility-administered medications for this visit.     OBJECTIVE: Vitals:   05/30/17 1039 05/30/17 1045  BP:  130/75  Pulse:  64  Resp: 12   Temp:  (!) 97 F (36.1 C)     Body mass index is 28.02 kg/m.    ECOG FS:0 - Asymptomatic  General: Well-developed, well-nourished, no acute distress. Eyes: Pink conjunctiva, anicteric sclera. Lungs: Clear to auscultation bilaterally. Heart: Regular rate and rhythm. No rubs,  murmurs, or gallops. Abdomen: Soft, nontender, nondistended. No organomegaly noted, normoactive bowel sounds. Musculoskeletal: No edema, cyanosis, or clubbing. Left arm in cast. Neuro: Alert, answering all questions appropriately. Cranial nerves grossly intact. Skin: Maculopapular rash noted, Improved. Psych: Normal affect.    LAB RESULTS:  Lab Results  Component Value Date   NA 138 05/30/2017   K 3.6 05/30/2017   CL 105 05/30/2017   CO2 24 05/30/2017   GLUCOSE 121 (H) 05/30/2017   BUN 21 (H) 05/30/2017   CREATININE 0.95 05/30/2017   CALCIUM 8.8 (L) 05/30/2017   PROT 6.7 05/30/2017   ALBUMIN 3.2 (L) 05/30/2017   AST 15 05/30/2017   ALT 7 (L) 05/30/2017   ALKPHOS 78 05/30/2017   BILITOT 0.4 05/30/2017   GFRNONAA 54 (L) 05/30/2017   GFRAA >60 05/30/2017    Lab Results  Component Value Date   WBC 12.3 (  H) 05/30/2017   NEUTROABS 8.5 (H) 05/30/2017   HGB 10.0 (L) 05/30/2017   HCT 30.9 (L) 05/30/2017   MCV 79.2 (L) 05/30/2017   PLT 242 05/30/2017     STUDIES: Nm Pet Image Restag (ps) Skull Base To Thigh  Result Date: 05/04/2017 CLINICAL DATA:  Subsequent treatment strategy for squamous cell carcinoma of the left lung. Current immunotherapy. EXAM: NUCLEAR MEDICINE PET SKULL BASE TO THIGH TECHNIQUE: 12.4 mCi F-18 FDG was injected intravenously. Full-ring PET imaging was performed from the skull base to thigh after the radiotracer. CT data was obtained and used for attenuation correction and anatomic localization. FASTING BLOOD GLUCOSE:  Value: 104 mg/dl COMPARISON:  Multiple exams, including 11/29/2016 FINDINGS: NECK No hypermetabolic lymph nodes in the neck. Chronic right maxillary sinusitis. CHEST A mass or lymph node anterior to the descending thoracic aorta measures 2.4 cm in short axis on image 95/4 (formerly 1.7 cm) with maximum SUV 37.5 (formerly 15.5). A pleural-based mass invading the left fourth rib anteriorly and extending into the chest wall measures 4.0 by 3.2 cm on  image 92/4 (previously pleural-based and measuring 1.9 by 0.8 cm). Maximum SUV is 15.3 (formerly 8.9). Nodule or lymph node inseparable from the left infrahilar vascular structures has a maximum SUV of 8.3 (formerly 10.4). There is peripheral airspace opacity in left lower lobe with some mild accentuation of activity. Accentuated activity along the left pleural space suspicious for left malignant pleural effusion. Faintly hypermetabolic left axillary and AP window lymph nodes are observed. A right lower paratracheal node measures 1.9 cm in short axis on image 71/4 (formerly 1.3 cm) with maximum SUV 3.0 today. Mild diffuse interstitial accentuation. Coronary, aortic arch, and branch vessel atherosclerotic vascular disease. Left Port-A-Cath tip: Brachiocephalic vein. Left posterior thyroid nodule 2.0 cm in long axis, not currently hypermetabolic. ABDOMEN/PELVIS No abnormal hypermetabolic activity within the liver, pancreas, adrenal glands, or spleen. No hypermetabolic lymph nodes in the abdomen or pelvis. Small bilateral adrenal adenomas. Prior cholecystectomy. Aortoiliac atherosclerotic vascular disease. Left mid kidney peripelvic cyst. Sigmoid diverticulosis. SKELETON Deformity of the left tenth rib posterolaterally along a 2.3 cm segment corresponding to prior lytic lesion. Maximum SUV at this site 3.1, previously 12.3. The pleural-based lesion invading the left fourth rib is worsened as noted in the chest section above. A small subcutaneous lesion in the right upper lateral thigh is triangular in shape and has a maximum SUV of 2.3, previously 2.9. IMPRESSION: 1. Mixed appearance. Some of the previous lesions are significantly improved, such as the left posteromedial pleural mass; the left pleural mass which previously invaded the left tenth rib; and left infrahilar mass. However, other lesions are markedly worsened, including the pleural based lesion now invading the left anterior fourth rib, and the lower  periaortic node which is greatly increased in size and maximum SUV. 2. Accentuated activity along the trace left pleural effusion suspicious for malignant effusion. 3. Small essentially stable focus of subcutaneous accentuated metabolic activity in the right upper lateral thigh, probably benign. Assuming that this is indeed benign, there no findings of extra thoracic metastatic disease. 4. Other imaging findings of potential clinical significance: Chronic right maxillary sinusitis. Mild diffuse nonspecific interstitial accentuation in the lungs. Aortic Atherosclerosis (ICD10-I70.0). Coronary atherosclerosis. Benign-appearing left thyroid nodule. Sigmoid diverticulosis. Small bilateral adrenal adenomas. Electronically Signed   By: Van Clines M.D.   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  PLAN:    1. Stage IVA  squamous cell carcinoma of the left lung: PET scan from May 04, 2017 reviewed independently and reported as above.  Overall she has improvement of disease burden, but the pleural-based lesion in her left anterior fourth rib appears to be worse.  Because of this, patient scheduled to initiate palliative XRT in the near future. Patient refused an MRI of the brain, but did have a head CT on January 02, 2017 that did not report metastatic disease. Treatment was initially delayed secondary to her broken arm.  Given the multiple problems she has had with chemotherapy treatment, including declining performance status, this was discontinued and proceeded with second line Keytruda.  Patient will receive this every 3 weeks until intolerable side effects or progression of disease. Proceed with cycle 6 of single agent Keytruda.  Return to clinic in 3 weeks for consideration of cycle 6.   2. Broken arm: Continue follow-up with orthopedics as scheduled.  Patient reports that she likely does not require surgery. 3. Pain: Patient does not complain  of this today.  Continue current narcotic regimen. 4.  Anemia: Mild, monitor.  Approximately 30 minutes was spent in discussion of which greater than 50% was consultation.  Patient expressed understanding and was in agreement with this plan. She also understands that She can call clinic at any time with any questions, concerns, or complaints.   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  INTERVAL HISTORY: Patient returns to clinic today for further evaluation and consideration of cycle 4 of Keytruda.  She is tolerating her treatments well without significant side effects.  She currently feels well and is asymptomatic.  She has a good appetite and no longer complains of weight loss. She does not complain of weakness or fatigue today. She has no neurologic complaints. She does not complain of rib or arm pain today. She has no chest pain, shortness of breath, cough, or hemoptysis. She denies any nausea, vomiting, constipation, or diarrhea. She has no urinary complaints. Patient offers no specific complaints today.  REVIEW OF SYSTEMS:   Review of Systems  Constitutional: Negative.  Negative for fever, malaise/fatigue and weight loss.  Respiratory: Negative.  Negative for cough and shortness of breath.   Cardiovascular: Negative.  Negative for chest pain and leg swelling.  Gastrointestinal: Negative.  Negative for abdominal pain and diarrhea.  Genitourinary:  Negative.   Musculoskeletal: Negative.  Negative for back pain, falls and joint pain.  Skin: Negative.  Negative for itching and rash.  Neurological: Negative.  Negative for sensory change and weakness.  Psychiatric/Behavioral: Negative.  The patient is not nervous/anxious.     As per HPI. Otherwise, a complete review of systems is negative.  PAST MEDICAL HISTORY: Past Medical History:  Diagnosis Date  . COPD (chronic obstructive pulmonary disease) ([place])   . GU (gastric peptic ulcer)   . Lung cancer ([place])   . Malignant pleural effusion   . PAF (paroxysmal atrial fibrillation) ([place]) 10/2016   a.  10/2016 post thoracentesis ; b. 10/2016 Echo: EF 65-70%, mild LVH;  c. Amio/Eliquis initiated (CHA2DSVASc = 3).  . Pneumonia   . Wrist fracture 1993   bilateral    PAST SURGICAL HISTORY: Past Surgical History:  Procedure Laterality Date  . ABDOMINAL HYSTERECTOMY    . CATARACT EXTRACTION W/ INTRAOCULAR LENS  IMPLANT, BILATERAL    . CHEST TUBE INSERTION Left 12/18/2016   Procedure: CHEST TUBE INSERTION;  Surgeon: [name], MD;  Location: [facility] ORS;  Service: General;  Laterality: Left;  . CHOLECYSTECTOMY    . CLOSED REDUCTION WRIST FRACTURE Left 01/04/2017   Procedure: CLOSED REDUCTION WRIST;  Surgeon: Thornton Park, MD;  Location: [facility] ORS;  Service: Orthopedics;  Laterality: Left;  . PORTACATH PLACEMENT Left 12/18/2016   Procedure: INSERTION PORT-A-CATH;  Surgeon: [name], MD;  Location: [facility] ORS;  Service: General;  Laterality: Left;  . REMOVAL OF PLEURAL DRAINAGE CATHETER N/A 01/02/2017   Procedure: REMOVAL OF PLEURAL DRAINAGE CATHETER;  Surgeon: [name], MD;  Location: [facility] ORS;  Service: Thoracic;  Laterality: N/A;  . TONSILLECTOMY      FAMILY HISTORY: Family History  Problem Relation Age of Onset  . Hypertension Father   . Heart disease Father   . Heart attack Father   . Congestive Heart Failure Mother   . Stroke Mother   . Atrial fibrillation Sister      ADVANCED DIRECTIVES (Y/N):  N  HEALTH MAINTENANCE: Social History   Tobacco Use  . Smoking status: Former Smoker    Packs/day: 1.00    Years: 68.00    Pack years: 68.00    Last attempt to quit: 11/06/2016    Years since quitting: 0.5  . Smokeless tobacco: Never Used  Substance Use Topics  . Alcohol use: No  . Drug use: No     Colonoscopy:  PAP:  Bone density:  Lipid panel:  Allergies  Allergen Reactions  . Sulfa Antibiotics Other (See Comments)    Seizures   . Levaquin [Levofloxacin] Hives and Itching    Current Outpatient Medications  Medication Sig Dispense Refill  . albuterol (PROVENTIL) (2.5 MG/3ML) 0.083% nebulizer solution Take 3 mLs (2.5 mg total) by nebulization 2 (two) times daily. 75 mL 12  . apixaban (ELIQUIS) 5 MG TABS tablet Take 1 tablet (5 mg total) by mouth 2 (two) times daily. 60 tablet 0  . BIOTIN PO Take by mouth daily.    . cetirizine (ZYRTEC) 10 MG tablet Take 10 mg by mouth daily as needed for allergies.    . cholecalciferol (VITAMIN D) 1000 units tablet Take 1,000 Units by mouth daily.    Marland Kitchen diltiazem (CARDIZEM CD) 120 MG 24 hr capsule Take 1 capsule (120 mg total) by mouth daily.    . diphenoxylate-atropine (LOMOTIL) 2.5-0.025 MG tablet Take 1 tablet 4 (four) times daily as needed by mouth for diarrhea or loose stools. 120 tablet 0  . furosemide (LASIX) 20 MG tablet Take 1 tablet by mouth daily as needed. For EDEMA  3  . HYDROcodone-acetaminophen (NORCO) 5-325 MG tablet Take 1 tablet by mouth every 4 (four) hours as needed for moderate pain. 30 tablet 0  . Multiple Vitamins-Minerals (MULTIVITAMIN ADULT PO) Take by mouth.    Marland Kitchen omeprazole (PRILOSEC) 20 MG capsule Take 1 capsule (20 mg total) by mouth daily. 90 capsule 2  . ondansetron (ZOFRAN) 8 MG tablet TAKE 1 TABLET TWO TIMES DAILY AS NEEDED FOR REFRACTORY NAUSEA OR VOMITING.  120 tablet 0  . potassium chloride SA (K-DUR,KLOR-CON)  20 MEQ tablet Take 20 mEq 2 (two) times daily by mouth.    .  prochlorperazine (COMPAZINE) 10 MG tablet Take 1 tablet (10 mg total) by mouth every 6 (six) hours as needed (Nausea or vomiting). 60 tablet 2   No current facility-administered medications for this visit.     OBJECTIVE: Vitals:   05/30/17 1039 05/30/17 1045  BP:  130/75  Pulse:  64  Resp: 12   Temp:  (!) 97 F (36.1 C)     Body mass index is 28.02 kg/m.    ECOG FS:1 - Symptomatic but completely ambulatory  General: Well-developed, well-nourished, no acute distress. Eyes: Pink conjunctiva, anicteric sclera. Lungs: Clear to auscultation bilaterally. Heart: Regular rate and rhythm. No rubs, murmurs, or gallops. Abdomen: Soft, nontender, nondistended. No organomegaly noted, normoactive bowel sounds. Musculoskeletal: No edema, cyanosis, or clubbing. Left arm in cast. Neuro: Alert, answering all questions appropriately. Cranial nerves grossly intact. Skin: Maculopapular rash noted, Improved. Psych: Normal affect.    LAB RESULTS:  Lab Results  Component Value Date   NA 138 05/30/2017   K 3.6 05/30/2017   CL 105 05/30/2017   CO2 24 05/30/2017   GLUCOSE 121 (H) 05/30/2017   BUN 21 (H) 05/30/2017   CREATININE 0.95 05/30/2017   CALCIUM 8.8 (L) 05/30/2017   PROT 6.7 05/30/2017   ALBUMIN 3.2 (L) 05/30/2017   AST 15 05/30/2017   ALT 7 (L) 05/30/2017   ALKPHOS 78 05/30/2017   BILITOT 0.4 05/30/2017   GFRNONAA 54 (L) 05/30/2017   GFRAA >60 05/30/2017    Lab Results  Component Value Date   WBC 12.3 (H) 05/30/2017   NEUTROABS 8.5 (H) 05/30/2017   HGB 10.0 (L) 05/30/2017   HCT 30.9 (L) 05/30/2017   MCV 79.2 (L) 05/30/2017   PLT 242 05/30/2017     STUDIES: Nm Pet Image Restag (ps) Skull Base To Thigh  Result Date: 05/04/2017 CLINICAL DATA:  Subsequent treatment strategy for squamous cell carcinoma of the left lung. Current immunotherapy. EXAM: NUCLEAR MEDICINE PET SKULL BASE TO THIGH TECHNIQUE: 12.4 mCi F-18 FDG was injected intravenously. Full-ring PET imaging was  performed from the skull base to thigh after the radiotracer. CT data was obtained and used for attenuation correction and anatomic localization. FASTING BLOOD GLUCOSE:  Value: 104 mg/dl COMPARISON:  Multiple exams, including 11/29/2016 FINDINGS: NECK No hypermetabolic lymph nodes in the neck. Chronic right maxillary sinusitis. CHEST A mass or lymph node anterior to the descending thoracic aorta measures 2.4 cm in short axis on image 95/4 (formerly 1.7 cm) with maximum SUV 37.5 (formerly 15.5). A pleural-based mass invading the left fourth rib anteriorly and extending into the chest wall measures 4.0 by 3.2 cm on image 92/4 (previously pleural-based and measuring 1.9 by 0.8 cm). Maximum SUV is 15.3 (formerly 8.9). Nodule or lymph node inseparable from the left infrahilar vascular structures has a maximum SUV of 8.3 (formerly 10.4). There is peripheral airspace opacity in left lower lobe with some mild accentuation of activity. Accentuated activity along the left pleural space suspicious for left malignant pleural effusion. Faintly hypermetabolic left axillary and AP window lymph nodes are observed. A right lower paratracheal node measures 1.9 cm in short axis on image 71/4 (formerly 1.3 cm) with maximum SUV 3.0 today. Mild diffuse interstitial accentuation. Coronary, aortic arch, and branch vessel atherosclerotic vascular disease. Left Port-A-Cath tip: Brachiocephalic vein. Left posterior thyroid nodule 2.0 cm in long axis, not currently hypermetabolic. ABDOMEN/PELVIS No abnormal hypermetabolic activity within the liver, pancreas, adrenal  glands, or spleen. No hypermetabolic lymph nodes in the abdomen or pelvis. Small bilateral adrenal adenomas. Prior cholecystectomy. Aortoiliac atherosclerotic vascular disease. Left mid kidney peripelvic cyst. Sigmoid diverticulosis. SKELETON Deformity of the left tenth rib posterolaterally along a 2.3 cm segment corresponding to prior lytic lesion. Maximum SUV at this site 3.1,  previously 12.3. The pleural-based lesion invading the left fourth rib is worsened as noted in the chest section above. A small subcutaneous lesion in the right upper lateral thigh is triangular in shape and has a maximum SUV of 2.3, previously 2.9. IMPRESSION: 1. Mixed appearance. Some of the previous lesions are significantly improved, such as the left posteromedial pleural mass; the left pleural mass which previously invaded the left tenth rib; and left infrahilar mass. However, other lesions are markedly worsened, including the pleural based lesion now invading the left anterior fourth rib, and the lower periaortic node which is greatly increased in size and maximum SUV. 2. Accentuated activity along the trace left pleural effusion suspicious for malignant effusion. 3. Small essentially stable focus of subcutaneous accentuated metabolic activity in the right upper lateral thigh, probably benign. Assuming that this is indeed benign, there no findings of extra thoracic metastatic disease. 4. Other imaging findings of potential clinical significance: Chronic right maxillary sinusitis. Mild diffuse nonspecific interstitial accentuation in the lungs. Aortic Atherosclerosis (ICD10-I70.0). Coronary atherosclerosis. Benign-appearing left thyroid nodule. Sigmoid diverticulosis. Small bilateral adrenal adenomas. Electronically Signed   By: Van Clines M.D.   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  PLAN:    1. 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  Overall she has improvement of disease burden, but the pleural-based lesion in her left anterior fourth rib appears to be worse.  Will discuss further at cancer conference tomorrow.  Patient refused an MRI of the brain, but did have a head CT on January 02, 2017 that did not report metastatic disease. Treatment was  initially delayed secondary to her broken arm.  Given the multiple problems she has had with chemotherapy treatment, including declining performance status, this was discontinued and proceeded with second line Keytruda.  Patient will receive this every 3 weeks until intolerable side effects or progression of disease. Proceed with cycle 6 of single agent Keytruda.    Patient is going on a trip, therefore she will return to clinic in 4 weeks for consideration of cycle 7.   2. Broken arm: Continue follow-up with orthopedics as scheduled.  Patient reports that she likely does not require surgery. 3. Pain: Patient does not complain of this today.  Continue current narcotic regimen. 4.  Anemia: Mild, monitor.  Approximately 30 minutes was spent in discussion of which greater than 50% was consultation.   Patient expressed understanding and was in agreement with this plan. She also understands that She can call clinic at any time with any questions, concerns, or complaints.   Cancer Staging Squamous cell carcinoma lung, left ([place]) Staging form: Lung, AJCC 8th Edition - Clinical stage from 12/13/2016: Stage IVA (cT4, cN0, cM1a) - Signed by [name], MD on 12/13/2016   [name], MD   06/02/2017 8:21 AM

## 2017-05-28 ENCOUNTER — Institutional Professional Consult (permissible substitution): Payer: Medicare HMO | Admitting: Radiation Oncology

## 2017-05-29 ENCOUNTER — Institutional Professional Consult (permissible substitution): Payer: Medicare HMO | Admitting: Radiation Oncology

## 2017-05-30 ENCOUNTER — Inpatient Hospital Stay (HOSPITAL_BASED_OUTPATIENT_CLINIC_OR_DEPARTMENT_OTHER): Payer: Medicare HMO | Admitting: Oncology

## 2017-05-30 ENCOUNTER — Encounter: Payer: Self-pay | Admitting: Oncology

## 2017-05-30 ENCOUNTER — Inpatient Hospital Stay: Payer: Medicare HMO | Attending: Internal Medicine

## 2017-05-30 ENCOUNTER — Inpatient Hospital Stay: Payer: Medicare HMO

## 2017-05-30 ENCOUNTER — Other Ambulatory Visit: Payer: Self-pay

## 2017-05-30 VITALS — BP 130/75 | HR 64 | Temp 97.0°F | Resp 12 | Ht 63.0 in | Wt 158.2 lb

## 2017-05-30 DIAGNOSIS — G893 Neoplasm related pain (acute) (chronic): Secondary | ICD-10-CM | POA: Diagnosis not present

## 2017-05-30 DIAGNOSIS — Z79899 Other long term (current) drug therapy: Secondary | ICD-10-CM | POA: Insufficient documentation

## 2017-05-30 DIAGNOSIS — Z5112 Encounter for antineoplastic immunotherapy: Secondary | ICD-10-CM | POA: Insufficient documentation

## 2017-05-30 DIAGNOSIS — D649 Anemia, unspecified: Secondary | ICD-10-CM

## 2017-05-30 DIAGNOSIS — I480 Paroxysmal atrial fibrillation: Secondary | ICD-10-CM | POA: Diagnosis not present

## 2017-05-30 DIAGNOSIS — C3492 Malignant neoplasm of unspecified part of left bronchus or lung: Secondary | ICD-10-CM

## 2017-05-30 DIAGNOSIS — Z7901 Long term (current) use of anticoagulants: Secondary | ICD-10-CM | POA: Diagnosis not present

## 2017-05-30 DIAGNOSIS — Z87891 Personal history of nicotine dependence: Secondary | ICD-10-CM

## 2017-05-30 LAB — COMPREHENSIVE METABOLIC PANEL
ALBUMIN: 3.2 g/dL — AB (ref 3.5–5.0)
ALK PHOS: 78 U/L (ref 38–126)
ALT: 7 U/L — AB (ref 14–54)
AST: 15 U/L (ref 15–41)
Anion gap: 9 (ref 5–15)
BUN: 21 mg/dL — AB (ref 6–20)
CO2: 24 mmol/L (ref 22–32)
CREATININE: 0.95 mg/dL (ref 0.44–1.00)
Calcium: 8.8 mg/dL — ABNORMAL LOW (ref 8.9–10.3)
Chloride: 105 mmol/L (ref 101–111)
GFR calc Af Amer: >60 mL/min (ref >60)
GFR calc non Af Amer: 54 mL/min — ABNORMAL LOW (ref >60)
GLUCOSE: 121 mg/dL — AB (ref 65–99)
Potassium: 3.6 mmol/L (ref 3.5–5.1)
SODIUM: 138 mmol/L (ref 135–145)
Total Bilirubin: 0.4 mg/dL (ref 0.3–1.2)
Total Protein: 6.7 g/dL (ref 6.5–8.1)

## 2017-05-30 LAB — CBC WITH DIFFERENTIAL/PLATELET
Basophils Absolute: 0.1 10*3/uL (ref 0–0.1)
Basophils Relative: 1 %
EOS ABS: 1.2 10*3/uL — AB (ref 0–0.7)
Eosinophils Relative: 10 %
HEMATOCRIT: 30.9 % — AB (ref 35.0–47.0)
Hemoglobin: 10 g/dL — ABNORMAL LOW (ref 12.0–16.0)
Lymphocytes Relative: 12 %
Lymphs Abs: 1.5 10*3/uL (ref 1.0–3.6)
MCH: 25.8 pg — ABNORMAL LOW (ref 26.0–34.0)
MCHC: 32.6 g/dL (ref 32.0–36.0)
MCV: 79.2 fL — ABNORMAL LOW (ref 80.0–100.0)
MONO ABS: 1 10*3/uL — AB (ref 0.2–0.9)
MONOS PCT: 8 %
Neutro Abs: 8.5 10*3/uL — ABNORMAL HIGH (ref 1.4–6.5)
Neutrophils Relative %: 69 %
Platelets: 242 10*3/uL (ref 150–440)
RBC: 3.9 MIL/uL (ref 3.80–5.20)
RDW: 15.5 % — AB (ref 11.5–14.5)
WBC: 12.3 10*3/uL — ABNORMAL HIGH (ref 3.6–11.0)

## 2017-05-30 MED ORDER — SODIUM CHLORIDE 0.9 % IV SOLN
Freq: Once | INTRAVENOUS | Status: AC
  Administered 2017-05-30: 12:00:00 via INTRAVENOUS
  Filled 2017-05-30: qty 1000

## 2017-05-30 MED ORDER — SODIUM CHLORIDE 0.9% FLUSH
10.0000 mL | INTRAVENOUS | Status: DC | PRN
  Filled 2017-05-30: qty 10

## 2017-05-30 MED ORDER — HEPARIN SOD (PORK) LOCK FLUSH 100 UNIT/ML IV SOLN
500.0000 [IU] | Freq: Once | INTRAVENOUS | Status: AC | PRN
  Administered 2017-05-30: 500 [IU]

## 2017-05-30 MED ORDER — SODIUM CHLORIDE 0.9 % IV SOLN
200.0000 mg | Freq: Once | INTRAVENOUS | Status: AC
  Administered 2017-05-30: 200 mg via INTRAVENOUS
  Filled 2017-05-30: qty 8

## 2017-05-30 NOTE — Progress Notes (Signed)
Patient here for follow up she is scheduled to start radiation

## 2017-05-31 LAB — THYROID PANEL WITH TSH
FREE THYROXINE INDEX: 2.5 (ref 1.2–4.9)
T3 Uptake Ratio: 27 % (ref 24–39)
T4, Total: 9.1 ug/dL (ref 4.5–12.0)
TSH: 2.42 u[IU]/mL (ref 0.450–4.500)

## 2017-06-06 ENCOUNTER — Ambulatory Visit
Admission: RE | Admit: 2017-06-06 | Discharge: 2017-06-06 | Disposition: A | Discharge status: Home / Self Care | Payer: Medicare HMO | Source: Ambulatory Visit | Attending: Radiation Oncology | Admitting: Radiation Oncology

## 2017-06-06 DIAGNOSIS — D649 Anemia, unspecified: Secondary | ICD-10-CM | POA: Diagnosis not present

## 2017-06-06 DIAGNOSIS — Z510 Encounter for antineoplastic radiation therapy: Secondary | ICD-10-CM | POA: Insufficient documentation

## 2017-06-06 DIAGNOSIS — C3492 Malignant neoplasm of unspecified part of left bronchus or lung: Secondary | ICD-10-CM | POA: Diagnosis not present

## 2017-06-11 DIAGNOSIS — Z510 Encounter for antineoplastic radiation therapy: Secondary | ICD-10-CM | POA: Diagnosis not present

## 2017-06-12 ENCOUNTER — Telehealth: Payer: Self-pay | Admitting: Internal Medicine

## 2017-06-12 NOTE — Telephone Encounter (Signed)
Spoke to pt grand daughter- re: new chest  Pressure inn addition to left chronic rib paiin; reocmmend eval in ER tonite; [as per family- reluctant]. If does not go to ER tonite- to call office in AM to see NP/symtom management clinic- understanding the risk of possible cardiac etiology of pain.

## 2017-06-13 NOTE — Telephone Encounter (Signed)
I attempted to call patient and granddaughter and did not get an answer at either number, I did leave a message on granddaughters voice mail to return my call

## 2017-06-13 NOTE — Telephone Encounter (Signed)
Granddaughter called and states that patient is still in bed and does not want to schedule appointment to be seen until after she get up and sees how she is doing. She will call me back and let me know

## 2017-06-13 NOTE — Telephone Encounter (Signed)
Thanks.  Will f/u today.

## 2017-06-22 ENCOUNTER — Other Ambulatory Visit: Payer: Self-pay | Admitting: *Deleted

## 2017-06-22 MED ORDER — HYDROCODONE-ACETAMINOPHEN 5-325 MG PO TABS: 1.0000 | ORAL_TABLET | ORAL | 0 refills | Status: DC | PRN

## 2017-06-24 NOTE — Progress Notes (Signed)
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  INTERVAL HISTORY: Patient returns to clinic today for further evaluation and consideration of cycle 7 of Keytruda.  She has initiated palliative XRT and is tolerating it well.  She continues to tolerate Keytruda without significant side effects.  She currently feels well and is asymptomatic.  She has a good appetite. She does not complain of weakness or fatigue today. She has no neurologic complaints.  She denies any pain.  She has no chest pain, shortness of breath, cough, or hemoptysis. She denies any nausea, vomiting, constipation, or diarrhea. She has no urinary complaints. Patient offers no specific complaints today.  REVIEW OF SYSTEMS:   Review of Systems  Constitutional: Negative.  Negative for fever, malaise/fatigue and weight loss.  Respiratory: Negative.  Negative for cough, hemoptysis and shortness of breath.   Cardiovascular: Negative.  Negative for chest pain and leg swelling.  Gastrointestinal: Negative.  Negative for abdominal pain and diarrhea.  Genitourinary: Negative.  Negative for flank pain.  Musculoskeletal: Negative.  Negative for back pain, falls and joint pain.  Skin: Negative.  Negative for itching and rash.  Neurological: Negative.  Negative for sensory change and weakness.  Psychiatric/Behavioral: Negative.  The patient is not nervous/anxious and does not have insomnia.     As per HPI. Otherwise, a complete review of systems is negative.  PAST MEDICAL HISTORY: Past Medical History:  Diagnosis Date  . COPD (chronic obstructive pulmonary disease) ([place])   . GU (gastric peptic ulcer)   . Lung cancer ([place])   . Malignant pleural effusion     . PAF (paroxysmal atrial fibrillation) ([place]) 10/2016   a. 10/2016 post thoracentesis ; b. 10/2016 Echo: EF 65-70%, mild LVH;  c. Amio/Eliquis initiated (CHA2DSVASc = 3).  . Pneumonia   . Wrist fracture 1993   bilateral    PAST SURGICAL HISTORY: Past Surgical History:  Procedure Laterality Date  . ABDOMINAL HYSTERECTOMY    . CATARACT EXTRACTION W/ INTRAOCULAR LENS  IMPLANT, BILATERAL    . CHEST TUBE INSERTION Left 12/18/2016   Procedure: CHEST TUBE INSERTION;  Surgeon: [name], MD;  Location: [facility] ORS;  Service: General;  Laterality: Left;  . CHOLECYSTECTOMY    . CLOSED REDUCTION WRIST FRACTURE Left 01/04/2017   Procedure: CLOSED REDUCTION WRIST;  Surgeon: Thornton Park, MD;  Location: [facility] ORS;  Service: Orthopedics;  Laterality: Left;  . PORTACATH PLACEMENT Left 12/18/2016   Procedure: INSERTION PORT-A-CATH;  Surgeon: [name], MD;  Location: [facility] ORS;  Service: General;  Laterality: Left;  . REMOVAL OF PLEURAL DRAINAGE CATHETER N/A 01/02/2017   Procedure: REMOVAL OF PLEURAL DRAINAGE CATHETER;  Surgeon: [name], MD;  Location: [facility] ORS;  Service: Thoracic;  Laterality: N/A;  . TONSILLECTOMY      FAMILY HISTORY: Family History  Problem Relation Age of Onset  . Hypertension Father   . Heart disease Father   . Heart attack Father   . Congestive Heart Failure Mother   . Stroke Mother   . Atrial fibrillation Sister     ADVANCED DIRECTIVES (Y/N):  N  HEALTH MAINTENANCE: Social History   Tobacco Use  . Smoking status: Former Smoker    Packs/day: 1.00    Years: 68.00    Pack  years: 68.00    Last attempt to quit: 11/06/2016    Years since quitting: 0.6  . Smokeless tobacco: Never Used  Substance Use Topics  . Alcohol use: No  . Drug use: No     Colonoscopy:  PAP:  Bone density:  Lipid panel:  Allergies  Allergen Reactions  . Sulfa Antibiotics Other (See Comments)    Seizures   . Levaquin [Levofloxacin] Hives and Itching    Current Outpatient  Medications  Medication Sig Dispense Refill  . apixaban (ELIQUIS) 5 MG TABS tablet Take 1 tablet (5 mg total) by mouth 2 (two) times daily. 60 tablet 0  . BIOTIN PO Take by mouth daily.    . cetirizine (ZYRTEC) 10 MG tablet Take 10 mg by mouth daily as needed for allergies.    . cholecalciferol (VITAMIN D) 1000 units tablet Take 1,000 Units by mouth daily.    Marland Kitchen diltiazem (CARDIZEM CD) 120 MG 24 hr capsule Take 1 capsule (120 mg total) by mouth daily.    . furosemide (LASIX) 20 MG tablet Take 1 tablet by mouth daily as needed. For EDEMA  3  . lidocaine-prilocaine (EMLA) cream APPLY TO AFFECTED AREA ONCE  3  . Multiple Vitamins-Minerals (MULTIVITAMIN ADULT PO) Take by mouth.    Marland Kitchen omeprazole (PRILOSEC) 20 MG capsule Take 1 capsule (20 mg total) by mouth daily. 90 capsule 2  . potassium chloride SA (K-DUR,KLOR-CON) 20 MEQ tablet Take 20 mEq 2 (two) times daily by mouth.    Marland Kitchen albuterol (PROVENTIL) (2.5 MG/3ML) 0.083% nebulizer solution Take 3 mLs (2.5 mg total) by nebulization 2 (two) times daily. (Patient not taking: Reported on 06/27/2017) 75 mL 12  . diphenoxylate-atropine (LOMOTIL) 2.5-0.025 MG tablet Take 1 tablet 4 (four) times daily as needed by mouth for diarrhea or loose stools. (Patient not taking: Reported on 06/27/2017) 120 tablet 0  . HYDROcodone-acetaminophen (NORCO) 5-325 MG tablet Take 1 tablet by mouth every 4 (four) hours as needed for moderate pain. (Patient not taking: Reported on 06/27/2017) 30 tablet 0  . ondansetron (ZOFRAN) 8 MG tablet TAKE 1 TABLET TWO TIMES DAILY AS NEEDED FOR REFRACTORY NAUSEA OR VOMITING.  (Patient not taking: Reported on 06/27/2017) 120 tablet 0  . prochlorperazine (COMPAZINE) 10 MG tablet Take 1 tablet (10 mg total) by mouth every 6 (six) hours as needed (Nausea or vomiting). (Patient not taking: Reported on 06/27/2017) 60 tablet 2   No current facility-administered medications for this visit.     OBJECTIVE: Vitals:   06/27/17 1110  BP: 134/63  Pulse: 72    Resp: 18  Temp: (!) 95.5 F (35.3 C)     Body mass index is 28.11 kg/m.    ECOG FS:0 - Asymptomatic  General: Well-developed, well-nourished, no acute distress. Eyes: Pink conjunctiva, anicteric sclera. Lungs: Clear to auscultation bilaterally. Heart: Regular rate and rhythm. No rubs, murmurs, or gallops. Abdomen: Soft, nontender, nondistended. No organomegaly noted, normoactive bowel sounds. Musculoskeletal: No edema, cyanosis, or clubbing.  Left wrist in splint. Neuro: Alert, answering all questions appropriately. Cranial nerves grossly intact. Skin: No rashes or petechiae noted. Psych: Normal affect.  LAB RESULTS:  Lab Results  Component Value Date   NA 136 06/27/2017   K 4.0 06/27/2017   CL 104 06/27/2017   CO2 24 06/27/2017   GLUCOSE 109 (H) 06/27/2017   BUN 18 06/27/2017   CREATININE 0.91 06/27/2017   CALCIUM 9.1 06/27/2017   PROT 7.2 06/27/2017   ALBUMIN 3.4 (L) 06/27/2017   AST 14 (L)  06/27/2017   ALT 8 (L) 06/27/2017   ALKPHOS 89 06/27/2017   BILITOT 0.5 06/27/2017   GFRNONAA 57 (L) 06/27/2017   GFRAA >60 06/27/2017    Lab Results  Component Value Date   WBC 15.2 (H) 06/27/2017   NEUTROABS 10.5 (H) 06/27/2017   HGB 10.4 (L) 06/27/2017   HCT 31.3 (L) 06/27/2017   MCV 77.5 (L) 06/27/2017   PLT 240 06/27/2017     STUDIES: No results found.  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  PLAN:    1. 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  Patient has now initiated XRT to this lesion. Patient refused an MRI of the brain, but did have a head CT on January 02, 2017 that did not report metastatic disease. Treatment was initially delayed secondary to her broken arm.  Given the multiple problems she has had with chemotherapy treatment,  including declining performance status, this was discontinued and proceeded with second line Keytruda.  Patient will receive this every 3 weeks until intolerable side effects or progression of disease. Proceed with cycle 7 of single agent Keytruda.  Return to clinic in 3 weeks for consideration of cycle 8.   2. Broken arm: Resolved.  Patient does not report any further follow-up with orthopedics. 3. Pain: Patient does not complain of this today.  Continue current narcotic regimen. 4.  Leukocytosis: Likely reactive, monitor. 5.  Hemoglobin: Decreased, but stable.  Monitor.   Patient expressed understanding and was in agreement with this plan. She also understands that She can call clinic at any time with any questions, concerns, or complaints.   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  INTERVAL HISTORY: Patient returns to clinic today for further evaluation and consideration of cycle 4 of Keytruda.  She is tolerating her treatments well without significant side effects.  She currently feels well and is asymptomatic.  She has a good appetite and no longer complains of weight loss. She does not complain of weakness or fatigue today. She has no neurologic complaints. She does not complain of rib or arm pain today. She has no chest pain, shortness of breath, cough, or hemoptysis. She denies any nausea, vomiting, constipation, or diarrhea. She has no urinary  complaints. Patient offers no specific complaints today.  REVIEW OF SYSTEMS:   Review of Systems  Constitutional: Negative.  Negative for fever, malaise/fatigue and weight loss.  Respiratory: Negative.  Negative for cough and shortness of breath.   Cardiovascular: Negative.  Negative for chest pain and leg swelling.  Gastrointestinal: Negative.  Negative for abdominal pain and diarrhea.  Genitourinary: Negative.   Musculoskeletal: Negative.  Negative for back pain, falls and joint pain.  Skin: Negative.  Negative for itching and rash.  Neurological: Negative.  Negative for sensory change and weakness.  Psychiatric/Behavioral: Negative.  The patient is not nervous/anxious.     As per HPI. Otherwise, a complete review of systems is negative.  PAST MEDICAL HISTORY: Past Medical History:  Diagnosis Date  . COPD (chronic obstructive pulmonary disease) ([place])   . GU (gastric peptic ulcer)   . Lung cancer ([place])   . Malignant pleural effusion   . PAF (paroxysmal atrial fibrillation) ([place]) 10/2016   a. 10/2016 post thoracentesis ; b. 10/2016 Echo: EF 65-70%, mild LVH;  c. Amio/Eliquis initiated (CHA2DSVASc = 3).  . Pneumonia   . Wrist fracture 1993   bilateral    PAST SURGICAL HISTORY: Past Surgical History:  Procedure Laterality Date  . ABDOMINAL HYSTERECTOMY    . CATARACT EXTRACTION W/ INTRAOCULAR LENS  IMPLANT, BILATERAL    . CHEST TUBE INSERTION Left 12/18/2016   Procedure: CHEST TUBE INSERTION;  Surgeon: [name], MD;  Location: [facility] ORS;  Service: General;  Laterality: Left;  . CHOLECYSTECTOMY    . CLOSED REDUCTION WRIST FRACTURE Left 01/04/2017   Procedure: CLOSED REDUCTION WRIST;  Surgeon: Thornton Park, MD;  Location: [facility] ORS;  Service: Orthopedics;  Laterality: Left;  . PORTACATH PLACEMENT Left 12/18/2016   Procedure: INSERTION PORT-A-CATH;  Surgeon: [name], MD;  Location: [facility] ORS;  Service: General;  Laterality: Left;  . REMOVAL OF PLEURAL DRAINAGE CATHETER  N/A 01/02/2017   Procedure: REMOVAL OF PLEURAL DRAINAGE CATHETER;  Surgeon: [name], MD;  Location: [facility] ORS;  Service: Thoracic;  Laterality: N/A;  . TONSILLECTOMY      FAMILY HISTORY: Family History  Problem Relation Age of Onset  . Hypertension Father   . Heart disease Father   . Heart attack Father   . Congestive Heart Failure Mother   . Stroke Mother   . Atrial fibrillation Sister     ADVANCED DIRECTIVES (Y/N):  N  HEALTH MAINTENANCE: Social History   Tobacco Use  . Smoking status: Former Smoker    Packs/day: 1.00    Years: 68.00    Pack years: 68.00    Last attempt to quit: 11/06/2016    Years since quitting: 0.6  . Smokeless tobacco: Never Used  Substance Use Topics  . Alcohol use: No  . Drug use: No     Colonoscopy:  PAP:  Bone density:  Lipid panel:  Allergies  Allergen Reactions  . Sulfa Antibiotics Other (See Comments)    Seizures   . Levaquin [Levofloxacin] Hives and Itching    Current Outpatient Medications  Medication Sig Dispense Refill  . apixaban (ELIQUIS) 5 MG TABS tablet Take 1 tablet (5 mg total) by mouth 2 (two) times daily. 60 tablet 0  . BIOTIN PO Take by mouth daily.    . cetirizine (ZYRTEC) 10 MG tablet Take 10 mg by mouth daily as needed for allergies.    . cholecalciferol (VITAMIN D) 1000 units tablet Take 1,000 Units by mouth daily.    Marland Kitchen diltiazem (CARDIZEM CD) 120 MG 24 hr capsule Take 1 capsule (120 mg total) by mouth daily.    . furosemide (LASIX) 20 MG tablet Take 1 tablet by mouth daily as needed. For EDEMA  3  . lidocaine-prilocaine (EMLA) cream APPLY TO AFFECTED AREA ONCE  3  . Multiple Vitamins-Minerals (MULTIVITAMIN ADULT PO) Take by mouth.    Marland Kitchen omeprazole (PRILOSEC) 20 MG capsule Take 1 capsule (20 mg total) by mouth daily. 90 capsule 2  . potassium chloride SA (K-DUR,KLOR-CON) 20 MEQ tablet Take 20 mEq 2 (  two) times daily by mouth.    Marland Kitchen albuterol (PROVENTIL) (2.5 MG/3ML) 0.083% nebulizer solution Take 3 mLs (2.5 mg  total) by nebulization 2 (two) times daily. (Patient not taking: Reported on 06/27/2017) 75 mL 12  . diphenoxylate-atropine (LOMOTIL) 2.5-0.025 MG tablet Take 1 tablet 4 (four) times daily as needed by mouth for diarrhea or loose stools. (Patient not taking: Reported on 06/27/2017) 120 tablet 0  . HYDROcodone-acetaminophen (NORCO) 5-325 MG tablet Take 1 tablet by mouth every 4 (four) hours as needed for moderate pain. (Patient not taking: Reported on 06/27/2017) 30 tablet 0  . ondansetron (ZOFRAN) 8 MG tablet TAKE 1 TABLET TWO TIMES DAILY AS NEEDED FOR REFRACTORY NAUSEA OR VOMITING.  (Patient not taking: Reported on 06/27/2017) 120 tablet 0  . prochlorperazine (COMPAZINE) 10 MG tablet Take 1 tablet (10 mg total) by mouth every 6 (six) hours as needed (Nausea or vomiting). (Patient not taking: Reported on 06/27/2017) 60 tablet 2   No current facility-administered medications for this visit.     OBJECTIVE: Vitals:   06/27/17 1110  BP: 134/63  Pulse: 72  Resp: 18  Temp: (!) 95.5 F (35.3 C)     Body mass index is 28.11 kg/m.    ECOG FS:1 - Symptomatic but completely ambulatory  General: Well-developed, well-nourished, no acute distress. Eyes: Pink conjunctiva, anicteric sclera. Lungs: Clear to auscultation bilaterally. Heart: Regular rate and rhythm. No rubs, murmurs, or gallops. Abdomen: Soft, nontender, nondistended. No organomegaly noted, normoactive bowel sounds. Musculoskeletal: No edema, cyanosis, or clubbing. Left arm in cast. Neuro: Alert, answering all questions appropriately. Cranial nerves grossly intact. Skin: Maculopapular rash noted, Improved. Psych: Normal affect.    LAB RESULTS:  Lab Results  Component Value Date   NA 136 06/27/2017   K 4.0 06/27/2017   CL 104 06/27/2017   CO2 24 06/27/2017   GLUCOSE 109 (H) 06/27/2017   BUN 18 06/27/2017   CREATININE 0.91 06/27/2017   CALCIUM 9.1 06/27/2017   PROT 7.2 06/27/2017   ALBUMIN 3.4 (L) 06/27/2017   AST 14 (L)  06/27/2017   ALT 8 (L) 06/27/2017   ALKPHOS 89 06/27/2017   BILITOT 0.5 06/27/2017   GFRNONAA 57 (L) 06/27/2017   GFRAA >60 06/27/2017    Lab Results  Component Value Date   WBC 15.2 (H) 06/27/2017   NEUTROABS 10.5 (H) 06/27/2017   HGB 10.4 (L) 06/27/2017   HCT 31.3 (L) 06/27/2017   MCV 77.5 (L) 06/27/2017   PLT 240 06/27/2017     STUDIES: No results found.  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  PLAN:    1. 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  Overall she has improvement of disease burden, but the pleural-based lesion in her left anterior fourth rib appears to be worse.  Will discuss further at cancer conference tomorrow.  Patient refused an MRI of the brain, but did have a head CT on January 02, 2017 that did not report metastatic disease. Treatment was initially delayed secondary to her broken arm.  Given the multiple problems she has had with chemotherapy treatment, including declining performance status, this was discontinued and proceeded with second line Keytruda.  Patient will receive this every 3 weeks until intolerable side effects or progression of disease. Proceed with cycle 6 of single agent Keytruda.    Patient is going on a trip, therefore she will return to clinic in  4 weeks for consideration of cycle 7.   2. Broken arm: Continue follow-up with orthopedics as scheduled.  Patient reports that she likely does not require surgery. 3. Pain: Patient does not complain of this today.  Continue current narcotic regimen. 4.  Anemia: Mild, monitor.  Approximately 30 minutes was spent in discussion of which greater than 50% was consultation.   Patient expressed understanding and was in agreement with this plan. She also understands that She can call clinic at any time with any questions, concerns, or complaints.   Cancer Staging Squamous cell  carcinoma lung, left ([place]) Staging form: Lung, AJCC 8th Edition - Clinical stage from 12/13/2016: Stage IVA (cT4, cN0, cM1a) - Signed by [name], MD on 12/13/2016   [name], MD   06/29/2017 1:36 PM

## 2017-06-25 ENCOUNTER — Ambulatory Visit
Admission: RE | Admit: 2017-06-25 | Discharge: 2017-06-25 | Disposition: A | Discharge status: Home / Self Care | Payer: Medicare HMO | Source: Ambulatory Visit | Attending: Radiation Oncology | Admitting: Radiation Oncology

## 2017-06-25 DIAGNOSIS — Z510 Encounter for antineoplastic radiation therapy: Secondary | ICD-10-CM | POA: Insufficient documentation

## 2017-06-25 DIAGNOSIS — C3492 Malignant neoplasm of unspecified part of left bronchus or lung: Secondary | ICD-10-CM | POA: Insufficient documentation

## 2017-06-26 ENCOUNTER — Other Ambulatory Visit: Payer: Self-pay | Admitting: Oncology

## 2017-06-26 ENCOUNTER — Ambulatory Visit
Admission: RE | Admit: 2017-06-26 | Discharge: 2017-06-26 | Disposition: A | Discharge status: Home / Self Care | Payer: Medicare HMO | Source: Ambulatory Visit | Attending: Radiation Oncology | Admitting: Radiation Oncology

## 2017-06-26 DIAGNOSIS — Z510 Encounter for antineoplastic radiation therapy: Secondary | ICD-10-CM | POA: Diagnosis not present

## 2017-06-26 DIAGNOSIS — C3492 Malignant neoplasm of unspecified part of left bronchus or lung: Secondary | ICD-10-CM | POA: Diagnosis not present

## 2017-06-27 ENCOUNTER — Other Ambulatory Visit: Payer: Self-pay

## 2017-06-27 ENCOUNTER — Ambulatory Visit
Admission: RE | Admit: 2017-06-27 | Discharge: 2017-06-27 | Disposition: A | Discharge status: Home / Self Care | Payer: Medicare HMO | Source: Ambulatory Visit | Attending: Radiation Oncology | Admitting: Radiation Oncology

## 2017-06-27 ENCOUNTER — Inpatient Hospital Stay: Payer: Medicare HMO

## 2017-06-27 ENCOUNTER — Inpatient Hospital Stay: Payer: Medicare HMO | Attending: Oncology

## 2017-06-27 ENCOUNTER — Inpatient Hospital Stay (HOSPITAL_BASED_OUTPATIENT_CLINIC_OR_DEPARTMENT_OTHER): Payer: Medicare HMO | Admitting: Oncology

## 2017-06-27 VITALS — BP 134/63 | HR 72 | Temp 95.5°F | Resp 18 | Wt 158.7 lb

## 2017-06-27 DIAGNOSIS — R197 Diarrhea, unspecified: Secondary | ICD-10-CM | POA: Insufficient documentation

## 2017-06-27 DIAGNOSIS — Z79899 Other long term (current) drug therapy: Secondary | ICD-10-CM | POA: Diagnosis not present

## 2017-06-27 DIAGNOSIS — Z87891 Personal history of nicotine dependence: Secondary | ICD-10-CM | POA: Diagnosis not present

## 2017-06-27 DIAGNOSIS — Z510 Encounter for antineoplastic radiation therapy: Secondary | ICD-10-CM | POA: Diagnosis not present

## 2017-06-27 DIAGNOSIS — Z5112 Encounter for antineoplastic immunotherapy: Secondary | ICD-10-CM | POA: Insufficient documentation

## 2017-06-27 DIAGNOSIS — R42 Dizziness and giddiness: Secondary | ICD-10-CM | POA: Insufficient documentation

## 2017-06-27 DIAGNOSIS — C3492 Malignant neoplasm of unspecified part of left bronchus or lung: Secondary | ICD-10-CM

## 2017-06-27 DIAGNOSIS — I4891 Unspecified atrial fibrillation: Secondary | ICD-10-CM | POA: Diagnosis not present

## 2017-06-27 DIAGNOSIS — I951 Orthostatic hypotension: Secondary | ICD-10-CM | POA: Diagnosis not present

## 2017-06-27 DIAGNOSIS — R110 Nausea: Secondary | ICD-10-CM | POA: Insufficient documentation

## 2017-06-27 DIAGNOSIS — D649 Anemia, unspecified: Secondary | ICD-10-CM | POA: Diagnosis not present

## 2017-06-27 DIAGNOSIS — Z7901 Long term (current) use of anticoagulants: Secondary | ICD-10-CM | POA: Diagnosis not present

## 2017-06-27 LAB — CBC WITH DIFFERENTIAL/PLATELET
Basophils Absolute: 0.1 10*3/uL (ref 0–0.1)
Basophils Relative: 1 %
EOS ABS: 1.6 10*3/uL — AB (ref 0–0.7)
EOS PCT: 11 %
HCT: 31.3 % — ABNORMAL LOW (ref 35.0–47.0)
HEMOGLOBIN: 10.4 g/dL — AB (ref 12.0–16.0)
LYMPHS ABS: 1.7 10*3/uL (ref 1.0–3.6)
LYMPHS PCT: 11 %
MCH: 25.7 pg — AB (ref 26.0–34.0)
MCHC: 33.1 g/dL (ref 32.0–36.0)
MCV: 77.5 fL — AB (ref 80.0–100.0)
MONOS PCT: 8 %
Monocytes Absolute: 1.3 10*3/uL — ABNORMAL HIGH (ref 0.2–0.9)
Neutro Abs: 10.5 10*3/uL — ABNORMAL HIGH (ref 1.4–6.5)
Neutrophils Relative %: 69 %
PLATELETS: 240 10*3/uL (ref 150–440)
RBC: 4.04 MIL/uL (ref 3.80–5.20)
RDW: 16 % — ABNORMAL HIGH (ref 11.5–14.5)
WBC: 15.2 10*3/uL — ABNORMAL HIGH (ref 3.6–11.0)

## 2017-06-27 LAB — COMPREHENSIVE METABOLIC PANEL
ALBUMIN: 3.4 g/dL — AB (ref 3.5–5.0)
ALT: 8 U/L — AB (ref 14–54)
AST: 14 U/L — AB (ref 15–41)
Alkaline Phosphatase: 89 U/L (ref 38–126)
Anion gap: 8 (ref 5–15)
BUN: 18 mg/dL (ref 6–20)
CHLORIDE: 104 mmol/L (ref 101–111)
CO2: 24 mmol/L (ref 22–32)
CREATININE: 0.91 mg/dL (ref 0.44–1.00)
Calcium: 9.1 mg/dL (ref 8.9–10.3)
GFR calc Af Amer: >60 mL/min (ref >60)
GFR calc non Af Amer: 57 mL/min — ABNORMAL LOW (ref >60)
Glucose, Bld: 109 mg/dL — ABNORMAL HIGH (ref 65–99)
POTASSIUM: 4 mmol/L (ref 3.5–5.1)
SODIUM: 136 mmol/L (ref 135–145)
Total Bilirubin: 0.5 mg/dL (ref 0.3–1.2)
Total Protein: 7.2 g/dL (ref 6.5–8.1)

## 2017-06-27 MED ORDER — SODIUM CHLORIDE 0.9 % IV SOLN
Freq: Once | INTRAVENOUS | Status: AC
  Administered 2017-06-27: 12:00:00 via INTRAVENOUS
  Filled 2017-06-27: qty 1000

## 2017-06-27 MED ORDER — HEPARIN SOD (PORK) LOCK FLUSH 100 UNIT/ML IV SOLN
500.0000 [IU] | Freq: Once | INTRAVENOUS | Status: AC
  Administered 2017-06-27: 500 [IU] via INTRAVENOUS
  Filled 2017-06-27: qty 5

## 2017-06-27 MED ORDER — SODIUM CHLORIDE 0.9 % IV SOLN
200.0000 mg | Freq: Once | INTRAVENOUS | Status: AC
  Administered 2017-06-27: 200 mg via INTRAVENOUS
  Filled 2017-06-27: qty 8

## 2017-06-27 NOTE — Progress Notes (Signed)
Here for follow up. Stated " im feeling pretty good and am proud Im walking in and not in a wheel chair"

## 2017-06-28 ENCOUNTER — Ambulatory Visit
Admission: RE | Admit: 2017-06-28 | Discharge: 2017-06-28 | Disposition: A | Discharge status: Home / Self Care | Payer: Medicare HMO | Source: Ambulatory Visit | Attending: Radiation Oncology | Admitting: Radiation Oncology

## 2017-06-28 DIAGNOSIS — Z510 Encounter for antineoplastic radiation therapy: Secondary | ICD-10-CM | POA: Diagnosis not present

## 2017-06-28 LAB — THYROID PANEL WITH TSH
Free Thyroxine Index: 2.8 (ref 1.2–4.9)
T3 Uptake Ratio: 29 % (ref 24–39)
T4, Total: 9.8 ug/dL (ref 4.5–12.0)
TSH: 1.1 u[IU]/mL (ref 0.450–4.500)

## 2017-06-29 ENCOUNTER — Ambulatory Visit
Admission: RE | Admit: 2017-06-29 | Discharge: 2017-06-29 | Disposition: A | Discharge status: Home / Self Care | Payer: Medicare HMO | Source: Ambulatory Visit | Attending: Radiation Oncology | Admitting: Radiation Oncology

## 2017-06-29 DIAGNOSIS — Z510 Encounter for antineoplastic radiation therapy: Secondary | ICD-10-CM | POA: Diagnosis not present

## 2017-07-02 ENCOUNTER — Ambulatory Visit
Admission: RE | Admit: 2017-07-02 | Discharge: 2017-07-02 | Disposition: A | Discharge status: Home / Self Care | Payer: Medicare HMO | Source: Ambulatory Visit | Attending: Radiation Oncology | Admitting: Radiation Oncology

## 2017-07-02 DIAGNOSIS — Z510 Encounter for antineoplastic radiation therapy: Secondary | ICD-10-CM | POA: Diagnosis not present

## 2017-07-03 ENCOUNTER — Encounter: Payer: Medicare HMO | Admitting: Nurse Practitioner

## 2017-07-03 ENCOUNTER — Other Ambulatory Visit: Payer: Self-pay | Admitting: *Deleted

## 2017-07-03 ENCOUNTER — Ambulatory Visit
Admission: RE | Admit: 2017-07-03 | Discharge: 2017-07-03 | Disposition: A | Discharge status: Home / Self Care | Payer: Medicare HMO | Source: Ambulatory Visit | Attending: Radiation Oncology | Admitting: Radiation Oncology

## 2017-07-03 ENCOUNTER — Other Ambulatory Visit: Payer: Medicare HMO

## 2017-07-03 DIAGNOSIS — Z510 Encounter for antineoplastic radiation therapy: Secondary | ICD-10-CM | POA: Diagnosis not present

## 2017-07-04 ENCOUNTER — Encounter: Payer: Self-pay | Admitting: Oncology

## 2017-07-04 ENCOUNTER — Inpatient Hospital Stay (HOSPITAL_BASED_OUTPATIENT_CLINIC_OR_DEPARTMENT_OTHER): Payer: Medicare HMO | Admitting: Oncology

## 2017-07-04 ENCOUNTER — Ambulatory Visit
Admission: RE | Admit: 2017-07-04 | Discharge: 2017-07-04 | Disposition: A | Discharge status: Home / Self Care | Payer: Medicare HMO | Source: Ambulatory Visit | Attending: Radiation Oncology | Admitting: Radiation Oncology

## 2017-07-04 ENCOUNTER — Inpatient Hospital Stay: Payer: Medicare HMO

## 2017-07-04 VITALS — BP 138/79

## 2017-07-04 VITALS — BP 139/73 | HR 73 | Temp 97.9°F | Resp 16 | Wt 158.0 lb

## 2017-07-04 DIAGNOSIS — R197 Diarrhea, unspecified: Secondary | ICD-10-CM | POA: Diagnosis not present

## 2017-07-04 DIAGNOSIS — Z87891 Personal history of nicotine dependence: Secondary | ICD-10-CM | POA: Diagnosis not present

## 2017-07-04 DIAGNOSIS — C3492 Malignant neoplasm of unspecified part of left bronchus or lung: Secondary | ICD-10-CM

## 2017-07-04 DIAGNOSIS — Z79899 Other long term (current) drug therapy: Secondary | ICD-10-CM

## 2017-07-04 DIAGNOSIS — R42 Dizziness and giddiness: Secondary | ICD-10-CM | POA: Diagnosis not present

## 2017-07-04 DIAGNOSIS — E860 Dehydration: Secondary | ICD-10-CM

## 2017-07-04 DIAGNOSIS — R110 Nausea: Secondary | ICD-10-CM | POA: Diagnosis not present

## 2017-07-04 DIAGNOSIS — Z510 Encounter for antineoplastic radiation therapy: Secondary | ICD-10-CM | POA: Diagnosis not present

## 2017-07-04 LAB — CBC WITH DIFFERENTIAL/PLATELET
BASOS ABS: 0.1 10*3/uL (ref 0–0.1)
Basophils Relative: 1 %
EOS PCT: 11 %
Eosinophils Absolute: 1.3 10*3/uL — ABNORMAL HIGH (ref 0–0.7)
HCT: 29.3 % — ABNORMAL LOW (ref 35.0–47.0)
Hemoglobin: 9.7 g/dL — ABNORMAL LOW (ref 12.0–16.0)
Lymphocytes Relative: 8 %
Lymphs Abs: 1 10*3/uL (ref 1.0–3.6)
MCH: 25.6 pg — ABNORMAL LOW (ref 26.0–34.0)
MCHC: 33.2 g/dL (ref 32.0–36.0)
MCV: 77.2 fL — AB (ref 80.0–100.0)
MONO ABS: 1 10*3/uL — AB (ref 0.2–0.9)
Monocytes Relative: 8 %
Neutro Abs: 9.2 10*3/uL — ABNORMAL HIGH (ref 1.4–6.5)
Neutrophils Relative %: 72 %
PLATELETS: 248 10*3/uL (ref 150–440)
RBC: 3.79 MIL/uL — ABNORMAL LOW (ref 3.80–5.20)
RDW: 15.7 % — AB (ref 11.5–14.5)
WBC: 12.5 10*3/uL — ABNORMAL HIGH (ref 3.6–11.0)

## 2017-07-04 LAB — COMPREHENSIVE METABOLIC PANEL
ALK PHOS: 88 U/L (ref 38–126)
ALT: 7 U/L — AB (ref 14–54)
AST: 13 U/L — AB (ref 15–41)
Albumin: 3.2 g/dL — ABNORMAL LOW (ref 3.5–5.0)
Anion gap: 9 (ref 5–15)
BILIRUBIN TOTAL: 0.5 mg/dL (ref 0.3–1.2)
BUN: 18 mg/dL (ref 6–20)
CALCIUM: 8.8 mg/dL — AB (ref 8.9–10.3)
CO2: 24 mmol/L (ref 22–32)
CREATININE: 0.87 mg/dL (ref 0.44–1.00)
Chloride: 103 mmol/L (ref 101–111)
GFR calc Af Amer: >60 mL/min (ref >60)
GFR, EST NON AFRICAN AMERICAN: 60 mL/min — AB (ref >60)
Glucose, Bld: 110 mg/dL — ABNORMAL HIGH (ref 65–99)
Potassium: 4.2 mmol/L (ref 3.5–5.1)
Sodium: 136 mmol/L (ref 135–145)
TOTAL PROTEIN: 6.7 g/dL (ref 6.5–8.1)

## 2017-07-04 LAB — MAGNESIUM: MAGNESIUM: 1.9 mg/dL (ref 1.7–2.4)

## 2017-07-04 MED ORDER — HEPARIN SOD (PORK) LOCK FLUSH 100 UNIT/ML IV SOLN
500.0000 [IU] | Freq: Once | INTRAVENOUS | Status: AC
  Administered 2017-07-04: 500 [IU] via INTRAVENOUS

## 2017-07-04 MED ORDER — SODIUM CHLORIDE 0.9 % IV SOLN
INTRAVENOUS | Status: DC
  Administered 2017-07-04: 11:00:00 via INTRAVENOUS
  Filled 2017-07-04 (×2): qty 1000

## 2017-07-04 MED ORDER — SODIUM CHLORIDE 0.9% FLUSH
10.0000 mL | INTRAVENOUS | Status: DC | PRN
  Administered 2017-07-04: 10 mL via INTRAVENOUS
  Filled 2017-07-04: qty 10

## 2017-07-04 NOTE — Progress Notes (Signed)
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 Grayland Ormond primary medical oncologist on 06/27/2017 for evaluation and consideration of cycle 7 of Keytruda. She had initiated palliative radiation and was tolerating well. She denied any significant side effects of Keytruda.  Patient with initial presentation to the emergency room in August 2018 for increasing shortness of breath and chest tightness. Subsequent CT scan revealed multiple lung masses and left-sided pleural effusion concerning for malignancy. Had thoracentesis with 1.7 L of fluid removed. PET scan and biopsy performed confirming stage IV squamous cell carcinoma. Patient was agreeable to start palliative chemotherapy with carbo/Taxol. Patient ultimately received 2 cycles of carbo/Taxol completing in November 2018. Began [place] in November 2018.   Most recent  PET scan from February 2019 revealed overall improvement of disease burden but pleural-based lesion in her left fourth rib appeared to be worse. Patient began radiation to this lesion. Recent CT of the head (refused MRI) did not reveal metastatic disease. Chemotherapy was postponed several times due to multiple plot problems including a broken arm and a significant decline in performance status that it was his continued altogether. Second line Beryle Flock was started. This will be given every 3 weeks until progression of disease.  Interval history-   Patient presents with dizziness .   The dizziness has been present for 5 days.  The patient  describes the symptoms as lightheadedness.  Symptoms are exacerbated by rising from supine position and bending  The patient also complains of nausea X 1 and diarrhea X 1 episode. Patient took 1 dose of Lomotil and 1 dose of Zofran with relief from both symptoms.  Patient denies tinnitus.  She has been treated with nothing. She knows to stand and turn slowly.   Previous work up has been nothing.   ECOG FS:1 - Symptomatic but completely ambulatory  Review of systems- Review of Systems  Constitutional: Negative.  Negative for chills, fever, malaise/fatigue and weight loss.  HENT: Negative for congestion and ear pain.   Eyes: Negative.  Negative for blurred vision and double vision.  Respiratory: Negative.  Negative for cough, sputum production and shortness of breath.   Cardiovascular: Negative.  Negative for chest pain, palpitations and leg swelling.  Gastrointestinal: Positive for diarrhea (1 episode) and nausea (1 episode). Negative for abdominal pain, constipation and vomiting.  Genitourinary: Negative for dysuria, frequency and urgency.  Musculoskeletal: Negative for back pain and falls.  Skin: Negative.  Negative for rash.  Neurological: Positive for dizziness (X 5 days). Negative for weakness and headaches.  Endo/Heme/Allergies: Negative.  Does not bruise/bleed easily.  Psychiatric/Behavioral: Negative.  Negative for depression. The patient is not nervous/anxious and does not have insomnia.      Current treatment- Keytruda  Allergies  Allergen Reactions  . Sulfa Antibiotics Other (See Comments)    Seizures   . Levaquin [Levofloxacin] Hives and Itching     Past Medical History:  Diagnosis Date  . COPD (chronic obstructive pulmonary disease) ([place])   . GU (gastric peptic ulcer)   . Lung cancer ([place])   . Malignant  pleural effusion   . PAF (paroxysmal atrial fibrillation) ([place]) 10/2016   a. 10/2016 post thoracentesis ; b. 10/2016 Echo: EF 65-70%, mild LVH;  c. Amio/Eliquis  initiated (CHA2DSVASc = 3).  . Pneumonia   . Wrist fracture 1993   bilateral     Past Surgical History:  Procedure Laterality Date  . ABDOMINAL HYSTERECTOMY    . CATARACT EXTRACTION W/ INTRAOCULAR LENS  IMPLANT, BILATERAL    . CHEST TUBE INSERTION Left 12/18/2016   Procedure: CHEST TUBE INSERTION;  Surgeon: [name], MD;  Location: [facility] ORS;  Service: General;  Laterality: Left;  . CHOLECYSTECTOMY    . CLOSED REDUCTION WRIST FRACTURE Left 01/04/2017   Procedure: CLOSED REDUCTION WRIST;  Surgeon: Thornton Park, MD;  Location: [facility] ORS;  Service: Orthopedics;  Laterality: Left;  . PORTACATH PLACEMENT Left 12/18/2016   Procedure: INSERTION PORT-A-CATH;  Surgeon: [name], MD;  Location: [facility] ORS;  Service: General;  Laterality: Left;  . REMOVAL OF PLEURAL DRAINAGE CATHETER N/A 01/02/2017   Procedure: REMOVAL OF PLEURAL DRAINAGE CATHETER;  Surgeon: [name], MD;  Location: [facility] ORS;  Service: Thoracic;  Laterality: N/A;  . TONSILLECTOMY      Social History   Socioeconomic History  . Marital status: Widowed    Spouse name: Not on file  . Number of children: Not on file  . Years of education: Not on file  . Highest education level: Not on file  Occupational History  . Not on file  Social Needs  . Financial resource strain: Not on file  . Food insecurity:    Worry: Not on file    Inability: Not on file  . Transportation needs:    Medical: Not on file    Non-medical: Not on file  Tobacco Use  . Smoking status: Former Smoker    Packs/day: 1.00    Years: 68.00    Pack years: 68.00    Last attempt to quit: 11/06/2016    Years since quitting: 0.6  . Smokeless tobacco: Never Used  Substance and Sexual Activity  . Alcohol use: No  . Drug use: No  . Sexual activity: Never  Lifestyle  . Physical activity:    Days per week: Not on file    Minutes per session: Not on file  . Stress: Not on file  Relationships  . Social connections:    Talks on phone: Not on  file    Gets together: Not on file    Attends religious service: Not on file    Active member of club or organization: Not on file    Attends meetings of clubs or organizations: Not on file    Relationship status: Not on file  . Intimate partner violence:    Fear of current or ex partner: Not on file    Emotionally abused: Not on file    Physically abused: Not on file    Forced sexual activity: Not on file  Other Topics Concern  . Not on file  Social History Narrative  . Not on file    Family History  Problem Relation Age of Onset  . Hypertension Father   . Heart disease Father   . Heart attack Father   . Congestive Heart Failure Mother   . Stroke Mother   . Atrial fibrillation Sister      Current Outpatient Medications:  .  apixaban (ELIQUIS) 5 MG TABS tablet, Take 1 tablet (5 mg total) by mouth 2 (two) times daily., Disp: 60 tablet, Rfl: 0 .  BIOTIN PO, Take by mouth daily., Disp: , Rfl:  .  cetirizine (ZYRTEC) 10 MG tablet, Take 10 mg by mouth daily as needed for allergies., Disp: , Rfl:  .  cholecalciferol (VITAMIN D) 1000 units tablet, Take 1,000 Units by mouth daily., Disp: , Rfl:  .  diltiazem (CARDIZEM CD) 120 MG 24 hr capsule, Take 1 capsule (120 mg total) by mouth daily., Disp: , Rfl:  .  diphenoxylate-atropine (LOMOTIL) 2.5-0.025 MG tablet, Take 1 tablet 4 (four) times daily as needed by mouth for diarrhea or loose stools., Disp: 120 tablet, Rfl: 0 .  furosemide (LASIX) 20 MG tablet, Take 1 tablet by mouth daily as needed. For EDEMA, Disp: , Rfl: 3 .  HYDROcodone-acetaminophen (NORCO) 5-325 MG tablet, Take 1 tablet by mouth every 4 (four) hours as needed for moderate pain., Disp: 30 tablet, Rfl: 0 .  lidocaine-prilocaine (EMLA) cream, APPLY TO AFFECTED AREA ONCE, Disp: , Rfl: 3 .  Multiple Vitamins-Minerals (MULTIVITAMIN ADULT PO), Take by mouth., Disp: , Rfl:  .  omeprazole (PRILOSEC) 20 MG capsule, Take 1 capsule (20 mg total) by mouth daily., Disp: 90 capsule,  Rfl: 2 .  ondansetron (ZOFRAN) 8 MG tablet, TAKE 1 TABLET TWO TIMES DAILY AS NEEDED FOR REFRACTORY NAUSEA OR VOMITING. , Disp: 120 tablet, Rfl: 0 .  potassium chloride SA (K-DUR,KLOR-CON) 20 MEQ tablet, Take 20 mEq 2 (two) times daily by mouth., Disp: , Rfl:  .  prochlorperazine (COMPAZINE) 10 MG tablet, Take 1 tablet (10 mg total) by mouth every 6 (six) hours as needed (Nausea or vomiting)., Disp: 60 tablet, Rfl: 2 .  albuterol (PROVENTIL) (2.5 MG/3ML) 0.083% nebulizer solution, Take 3 mLs (2.5 mg total) by nebulization 2 (two) times daily. (Patient not taking: Reported on 06/27/2017), Disp: 75 mL, Rfl: 12  Physical exam:  Vitals:   07/04/17 1050 07/04/17 1055  BP:  (S) 139/73  Pulse:  73  Resp:  16  Temp:  97.9 F (36.6 C)  TempSrc: Tympanic Tympanic  SpO2: 96%   Weight: 158 lb (71.7 kg)    Physical Exam  Constitutional: She is oriented to person, place, and time. Vital signs are normal.  HENT:  Head: Normocephalic and atraumatic.  Eyes: Pupils are equal, round, and reactive to light.  Neck: Normal range of motion.  Cardiovascular: Normal rate, regular rhythm and normal heart sounds.  No murmur heard. Orthostatic changes noted   Pulmonary/Chest: Effort normal and breath sounds normal. She has no wheezes.  Abdominal: Soft. Normal appearance and bowel sounds are normal. She exhibits no distension. There is no tenderness.  Musculoskeletal: Normal range of motion. She exhibits no edema.  Neurological: She is alert and oriented to person, place, and time.  Skin: Skin is warm and dry. No rash noted.  Psychiatric: Judgment normal.     CMP Latest Ref Rng & Units 07/04/2017  Glucose 65 - 99 mg/dL 110(H)  BUN 6 - 20 mg/dL 18  Creatinine 0.44 - 1.00 mg/dL 0.87  Sodium 135 - 145 mmol/L 136  Potassium 3.5 - 5.1 mmol/L 4.2  Chloride 101 - 111 mmol/L 103  CO2 22 - 32 mmol/L 24  Calcium 8.9 - 10.3 mg/dL 8.8(L)  Total Protein 6.5 - 8.1 g/dL 6.7  Total Bilirubin 0.3 - 1.2 mg/dL 0.5    Alkaline Phos 38 - 126 U/L 88  AST 15 - 41 U/L 13(L)  ALT 14 - 54 U/L 7(L)   CBC Latest Ref Rng & Units 07/04/2017  WBC 3.6 - 11.0 K/uL 12.5(H)  Hemoglobin 12.0 - 16.0 g/dL 9.7(L)  Hematocrit 35.0 - 47.0 % 29.3(L)  Platelets 150 - 440 K/uL 248    No images are attached to the encounter.  No results found.   Assessment and plan- Patient is a 84 y.o. female who presents for dizziness with 1 episode of nausea and diarrhea.  1. 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 She is tolerating these well. He is scheduled to return to clinic to see Dr. Rogue Bussing for consideration of cycle 8 on 07/18/2017. She is to continue daily radiation which is scheduled to be completed on 07/23/2017.  2. Dizziness: Orthostatic vital signs collected. Patient appears to have orthostatic hypotension with a significant drop in blood pressure when rising to a standing position. 139/73 sitting and 119/65 when standing.  Patient given 1 L normal saline in clinic today with significant improvement of vital signs. Rechecked orthostatic vital signs after completion of fluids, patient is no longer orthostatic. BP sitting 138/79 and 139/72 standing.   Encouraged patient to stay adequately hydrated and to call for an appointment in symptom management if she feels dizzy again. Primary medical oncologist, Dr. Grayland Ormond notified.   3. One episode of diarrhea/nausea: Patient sent with supplies to collect stool sample if diarrhea continues or increases in frequency.   Visit Diagnosis 1. Orthostatic dizziness     Patient expressed understanding and was in agreement with this plan. She also understands that She can call clinic at any time with any questions, concerns, or complaints.   Greater than 50% was spent in counseling and coordination of care with this patient including but not limited to discussion of the relevant topics above (See A&P) including, but not limited to diagnosis and  management of acute and chronic medical conditions.    Faythe Casa, AGNP-C [facility]- [phone] Pager- [phone] 07/05/2017 3:12 PM

## 2017-07-05 ENCOUNTER — Ambulatory Visit
Admission: RE | Admit: 2017-07-05 | Discharge: 2017-07-05 | Disposition: A | Discharge status: Home / Self Care | Payer: Medicare HMO | Source: Ambulatory Visit | Attending: Radiation Oncology | Admitting: Radiation Oncology

## 2017-07-05 DIAGNOSIS — Z510 Encounter for antineoplastic radiation therapy: Secondary | ICD-10-CM | POA: Diagnosis not present

## 2017-07-06 ENCOUNTER — Ambulatory Visit
Admission: RE | Admit: 2017-07-06 | Discharge: 2017-07-06 | Disposition: A | Discharge status: Home / Self Care | Payer: Medicare HMO | Source: Ambulatory Visit | Attending: Radiation Oncology | Admitting: Radiation Oncology

## 2017-07-06 DIAGNOSIS — Z510 Encounter for antineoplastic radiation therapy: Secondary | ICD-10-CM | POA: Diagnosis not present

## 2017-07-09 ENCOUNTER — Ambulatory Visit
Admission: RE | Admit: 2017-07-09 | Discharge: 2017-07-09 | Disposition: A | Discharge status: Home / Self Care | Payer: Medicare HMO | Source: Ambulatory Visit | Attending: Radiation Oncology | Admitting: Radiation Oncology

## 2017-07-09 DIAGNOSIS — Z510 Encounter for antineoplastic radiation therapy: Secondary | ICD-10-CM | POA: Diagnosis not present

## 2017-07-10 ENCOUNTER — Ambulatory Visit
Admission: RE | Admit: 2017-07-10 | Discharge: 2017-07-10 | Disposition: A | Discharge status: Home / Self Care | Payer: Medicare HMO | Source: Ambulatory Visit | Attending: Radiation Oncology | Admitting: Radiation Oncology

## 2017-07-10 DIAGNOSIS — Z510 Encounter for antineoplastic radiation therapy: Secondary | ICD-10-CM | POA: Diagnosis not present

## 2017-07-11 ENCOUNTER — Ambulatory Visit
Admission: RE | Admit: 2017-07-11 | Discharge: 2017-07-11 | Disposition: A | Discharge status: Home / Self Care | Payer: Medicare HMO | Source: Ambulatory Visit | Attending: Radiation Oncology | Admitting: Radiation Oncology

## 2017-07-11 DIAGNOSIS — Z510 Encounter for antineoplastic radiation therapy: Secondary | ICD-10-CM | POA: Diagnosis not present

## 2017-07-12 ENCOUNTER — Ambulatory Visit
Admission: RE | Admit: 2017-07-12 | Discharge: 2017-07-12 | Disposition: A | Discharge status: Home / Self Care | Payer: Medicare HMO | Source: Ambulatory Visit | Attending: Radiation Oncology | Admitting: Radiation Oncology

## 2017-07-12 DIAGNOSIS — Z510 Encounter for antineoplastic radiation therapy: Secondary | ICD-10-CM | POA: Diagnosis not present

## 2017-07-13 ENCOUNTER — Ambulatory Visit
Admission: RE | Admit: 2017-07-13 | Discharge: 2017-07-13 | Disposition: A | Discharge status: Home / Self Care | Payer: Medicare HMO | Source: Ambulatory Visit | Attending: Radiation Oncology | Admitting: Radiation Oncology

## 2017-07-13 DIAGNOSIS — Z510 Encounter for antineoplastic radiation therapy: Secondary | ICD-10-CM | POA: Diagnosis not present

## 2017-07-15 NOTE — Progress Notes (Signed)
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  INTERVAL HISTORY: Patient returns to clinic today for further evaluation.  Secondary to insurance purposes, cannot proceed with Keytruda until she has a restaging PET scan.  She continues to tolerate XRT well without significant side effects. She currently feels well and is asymptomatic.  She has a good appetite. She does not complain of weakness or fatigue today. She has no neurologic complaints.  She denies any pain.  She has no chest pain, shortness of breath, cough, or hemoptysis. She denies any nausea, vomiting, constipation, or diarrhea. She has no urinary complaints.  Patient feels at her baseline offers no specific complaints today.  REVIEW OF SYSTEMS:   Review of Systems  Constitutional: Negative.  Negative for fever, malaise/fatigue and weight loss.  Respiratory: Positive for cough. Negative for hemoptysis and shortness of breath.   Cardiovascular: Negative.  Negative for chest pain and leg swelling.  Gastrointestinal: Negative.  Negative for abdominal pain and diarrhea.  Genitourinary: Negative.  Negative for flank pain.  Musculoskeletal: Positive for back pain. Negative for falls and joint pain.  Skin: Negative.  Negative for itching and rash.  Neurological: Negative.  Negative for sensory change and weakness.  Psychiatric/Behavioral: Negative.  The patient is not nervous/anxious and does not have insomnia.     As per HPI. Otherwise, a complete review of systems is negative.  PAST MEDICAL HISTORY: Past Medical History:  Diagnosis Date  . COPD (chronic obstructive pulmonary disease) ([place])   . GU (gastric peptic ulcer)   . Lung cancer ([place])   .  Malignant pleural effusion   . PAF (paroxysmal atrial fibrillation) ([place]) 10/2016   a. 10/2016 post thoracentesis ; b. 10/2016 Echo: EF 65-70%, mild LVH;  c. Amio/Eliquis initiated (CHA2DSVASc = 3).  . Pneumonia   . Wrist fracture 1993   bilateral    PAST SURGICAL HISTORY: Past Surgical History:  Procedure Laterality Date  . ABDOMINAL HYSTERECTOMY    . CATARACT EXTRACTION W/ INTRAOCULAR LENS  IMPLANT, BILATERAL    . CHEST TUBE INSERTION Left 12/18/2016   Procedure: CHEST TUBE INSERTION;  Surgeon: [name], MD;  Location: [facility] ORS;  Service: General;  Laterality: Left;  . CHOLECYSTECTOMY    . CLOSED REDUCTION WRIST FRACTURE Left 01/04/2017   Procedure: CLOSED REDUCTION WRIST;  Surgeon: Thornton Park, MD;  Location: [facility] ORS;  Service: Orthopedics;  Laterality: Left;  . PORTACATH PLACEMENT Left 12/18/2016   Procedure: INSERTION PORT-A-CATH;  Surgeon: [name], MD;  Location: [facility] ORS;  Service: General;  Laterality: Left;  . REMOVAL OF PLEURAL DRAINAGE CATHETER N/A 01/02/2017   Procedure: REMOVAL OF PLEURAL DRAINAGE CATHETER;  Surgeon: [name], MD;  Location: [facility] ORS;  Service: Thoracic;  Laterality: N/A;  . TONSILLECTOMY      FAMILY HISTORY: Family History  Problem Relation Age of Onset  . Hypertension Father   . Heart disease Father   . Heart attack Father   . Congestive Heart Failure Mother   . Stroke Mother   . Atrial fibrillation Sister     ADVANCED DIRECTIVES (Y/N):  N  HEALTH MAINTENANCE: Social History   Tobacco Use  . Smoking status: Former Smoker    Packs/day: 1.00    Years: 68.00  Pack years: 68.00    Last attempt to quit: 11/06/2016    Years since quitting: 0.6  . Smokeless tobacco: Never Used  Substance Use Topics  . Alcohol use: No  . Drug use: No     Colonoscopy:  PAP:  Bone density:  Lipid panel:  Allergies  Allergen Reactions  . Sulfa Antibiotics Other (See Comments)    Seizures   . Levaquin [Levofloxacin] Hives and  Itching    Current Outpatient Medications  Medication Sig Dispense Refill  . apixaban (ELIQUIS) 5 MG TABS tablet Take 1 tablet (5 mg total) by mouth 2 (two) times daily. 60 tablet 0  . BIOTIN PO Take by mouth daily.    . cetirizine (ZYRTEC) 10 MG tablet Take 10 mg by mouth daily as needed for allergies.    . cholecalciferol (VITAMIN D) 1000 units tablet Take 1,000 Units by mouth daily.    Marland Kitchen diltiazem (CARDIZEM CD) 120 MG 24 hr capsule Take 1 capsule (120 mg total) by mouth daily.    . furosemide (LASIX) 20 MG tablet Take 1 tablet by mouth daily as needed. For EDEMA  3  . HYDROcodone-acetaminophen (NORCO) 5-325 MG tablet Take 1 tablet by mouth every 4 (four) hours as needed for moderate pain. 30 tablet 0  . lidocaine-prilocaine (EMLA) cream APPLY TO AFFECTED AREA ONCE  3  . Multiple Vitamins-Minerals (MULTIVITAMIN ADULT PO) Take by mouth.    Marland Kitchen omeprazole (PRILOSEC) 20 MG capsule Take 1 capsule (20 mg total) by mouth daily. 90 capsule 2  . ondansetron (ZOFRAN) 8 MG tablet TAKE 1 TABLET TWO TIMES DAILY AS NEEDED FOR REFRACTORY NAUSEA OR VOMITING.  120 tablet 0  . potassium chloride SA (K-DUR,KLOR-CON) 20 MEQ tablet Take 20 mEq 2 (two) times daily by mouth.    . prochlorperazine (COMPAZINE) 10 MG tablet Take 1 tablet (10 mg total) by mouth every 6 (six) hours as needed (Nausea or vomiting). 60 tablet 2  . diphenoxylate-atropine (LOMOTIL) 2.5-0.025 MG tablet Take 1 tablet 4 (four) times daily as needed by mouth for diarrhea or loose stools. (Patient not taking: Reported on 07/18/2017) 120 tablet 0   No current facility-administered medications for this visit.     OBJECTIVE: Vitals:   07/18/17 1100  BP: 138/72  Pulse: 76  Resp: 18  Temp: 97.8 F (36.6 C)  SpO2: 97%     Body mass index is 27.99 kg/m.    ECOG FS:0 - Asymptomatic  General: Well-developed, well-nourished, no acute distress. Eyes: Pink conjunctiva, anicteric sclera. Lungs: Clear to auscultation bilaterally. Heart: Regular  rate and rhythm. No rubs, murmurs, or gallops. Abdomen: Soft, nontender, nondistended. No organomegaly noted, normoactive bowel sounds. Musculoskeletal: No edema, cyanosis, or clubbing. Neuro: Alert, answering all questions appropriately. Cranial nerves grossly intact. Skin: No rashes or petechiae noted. Psych: Normal affect.  LAB RESULTS:  Lab Results  Component Value Date   NA 137 07/18/2017   K 4.0 07/18/2017   CL 103 07/18/2017   CO2 24 07/18/2017   GLUCOSE 115 (H) 07/18/2017   BUN 13 07/18/2017   CREATININE 0.95 07/18/2017   CALCIUM 8.9 07/18/2017   PROT 6.7 07/18/2017   ALBUMIN 3.1 (L) 07/18/2017   AST 13 (L) 07/18/2017   ALT 5 (L) 07/18/2017   ALKPHOS 94 07/18/2017   BILITOT 0.4 07/18/2017   GFRNONAA 54 (L) 07/18/2017   GFRAA >60 07/18/2017    Lab Results  Component Value Date   WBC 8.7 07/18/2017   NEUTROABS 6.5 07/18/2017   HGB 10.0 (  L) 07/18/2017   HCT 30.0 (L) 07/18/2017   MCV 77.8 (L) 07/18/2017   PLT 257 07/18/2017     STUDIES: No results found.  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  PLAN:    1. 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  Continue daily XRT completing in Jul 23, 2017.  Patient refused an MRI of the brain, but did have a head CT on January 02, 2017 that did not report metastatic disease. Given the multiple problems she has had with chemotherapy treatment, including declining performance status, this was discontinued and proceeded with second line Keytruda.  Patient will receive this every 3 weeks until intolerable side effects or progression of disease.  Delay cycle 8 of single agent Keytruda until imaging can be obtained and we have insurance approval.  Return to clinic in 1 week for further evaluation and  reconsideration of treatment.  2. Broken arm: Resolved.  Patient does not report any further follow-up with orthopedics. 3. Pain: Patient does not complain of this today.  Continue current narcotic regimen. 4.  Leukocytosis: Resolved.  White count within normal limits. 5.  Hemoglobin: Decreased, but stable.  Monitor.   Approximately 20 minutes spent in discussion of which greater than 50% was consultation.  Patient expressed understanding and was in agreement with this plan. She also understands that She can call clinic at any time with any questions, concerns, or complaints.   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  INTERVAL HISTORY: Patient returns to clinic today for further evaluation and consideration of cycle 4 of Keytruda.  She is tolerating her treatments well without significant side effects.  She currently feels well and is asymptomatic.  She has a good appetite and no longer complains of weight loss. She does not complain of weakness or fatigue today. She has no neurologic complaints. She does not complain of rib or arm pain today. She has no chest pain, shortness of breath, cough, or hemoptysis. She denies any nausea, vomiting, constipation, or diarrhea. She has no urinary complaints. Patient offers no specific complaints today.  REVIEW OF SYSTEMS:   Review of Systems  Constitutional: Negative.  Negative for fever, malaise/fatigue and weight  loss.  Respiratory: Negative.  Negative for cough and shortness of breath.   Cardiovascular: Negative.  Negative for chest pain and leg swelling.  Gastrointestinal: Negative.  Negative for abdominal pain and diarrhea.  Genitourinary: Negative.   Musculoskeletal: Negative.  Negative for back pain, falls and joint pain.  Skin: Negative.  Negative for itching and rash.  Neurological: Negative.  Negative for sensory change and weakness.  Psychiatric/Behavioral: Negative.  The patient is not nervous/anxious.     As per HPI. Otherwise, a complete review of systems is negative.  PAST  MEDICAL HISTORY: Past Medical History:  Diagnosis Date  . COPD (chronic obstructive pulmonary disease) ([place])   . GU (gastric peptic ulcer)   . Lung cancer ([place])   . Malignant pleural effusion   . PAF (paroxysmal atrial fibrillation) ([place]) 10/2016   a. 10/2016 post thoracentesis ; b. 10/2016 Echo: EF 65-70%, mild LVH;  c. Amio/Eliquis initiated (CHA2DSVASc = 3).  . Pneumonia   . Wrist fracture 1993   bilateral    PAST SURGICAL HISTORY: Past Surgical History:  Procedure Laterality Date  . ABDOMINAL HYSTERECTOMY    . CATARACT EXTRACTION W/ INTRAOCULAR LENS  IMPLANT, BILATERAL    . CHEST TUBE INSERTION Left 12/18/2016   Procedure: CHEST TUBE INSERTION;  Surgeon: [name], MD;  Location: [facility] ORS;  Service: General;  Laterality: Left;  . CHOLECYSTECTOMY    . CLOSED REDUCTION WRIST FRACTURE Left 01/04/2017   Procedure: CLOSED REDUCTION WRIST;  Surgeon: Thornton Park, MD;  Location: [facility] ORS;  Service: Orthopedics;  Laterality: Left;  . PORTACATH PLACEMENT Left 12/18/2016   Procedure: INSERTION PORT-A-CATH;  Surgeon: [name], MD;  Location: [facility] ORS;  Service: General;  Laterality: Left;  . REMOVAL OF PLEURAL DRAINAGE CATHETER N/A 01/02/2017   Procedure: REMOVAL OF PLEURAL DRAINAGE CATHETER;  Surgeon: [name], MD;  Location: [facility] ORS;  Service: Thoracic;  Laterality: N/A;  . TONSILLECTOMY       FAMILY HISTORY: Family History  Problem Relation Age of Onset  . Hypertension Father   . Heart disease Father   . Heart attack Father   . Congestive Heart Failure Mother   . Stroke Mother   . Atrial fibrillation Sister     ADVANCED DIRECTIVES (Y/N):  N  HEALTH MAINTENANCE: Social History   Tobacco Use  . Smoking status: Former Smoker    Packs/day: 1.00    Years: 68.00    Pack years: 68.00    Last attempt to quit: 11/06/2016    Years since quitting: 0.6  . Smokeless tobacco: Never Used  Substance Use Topics  . Alcohol use: No  . Drug use: No     Colonoscopy:  PAP:  Bone density:  Lipid panel:  Allergies  Allergen Reactions  . Sulfa Antibiotics Other (See Comments)    Seizures   . Levaquin [Levofloxacin] Hives and Itching    Current Outpatient Medications  Medication Sig Dispense Refill  . apixaban (ELIQUIS) 5 MG TABS tablet Take 1 tablet (5 mg total) by mouth 2 (two) times daily. 60 tablet 0  . BIOTIN PO Take by mouth daily.    . cetirizine (ZYRTEC) 10 MG tablet Take 10 mg by mouth daily as needed for allergies.    . cholecalciferol (VITAMIN D) 1000 units tablet Take 1,000 Units by mouth daily.    Marland Kitchen diltiazem (CARDIZEM CD) 120 MG 24 hr capsule Take 1 capsule (120 mg total) by mouth daily.    . furosemide (LASIX) 20 MG tablet Take 1 tablet by mouth daily as needed. For EDEMA  3  . HYDROcodone-acetaminophen (NORCO) 5-325 MG tablet Take 1 tablet by mouth every 4 (four) hours as needed for moderate pain. 30 tablet 0  . lidocaine-prilocaine (EMLA) cream APPLY TO AFFECTED AREA ONCE  3  . Multiple Vitamins-Minerals (MULTIVITAMIN ADULT PO) Take by mouth.    Marland Kitchen omeprazole (PRILOSEC) 20 MG capsule Take 1 capsule (20 mg total) by mouth daily. 90 capsule 2  . ondansetron (ZOFRAN) 8 MG tablet TAKE 1 TABLET TWO TIMES DAILY AS NEEDED FOR REFRACTORY NAUSEA OR VOMITING.  120 tablet 0  . potassium chloride SA (K-DUR,KLOR-CON) 20 MEQ tablet Take 20 mEq 2 (two) times daily by  mouth.    . prochlorperazine (COMPAZINE) 10 MG tablet Take 1 tablet (10 mg total) by mouth every 6 (six) hours as needed (Nausea or vomiting). 60 tablet 2  . diphenoxylate-atropine (LOMOTIL) 2.5-0.025 MG tablet Take 1 tablet 4 (four) times daily as needed by mouth for diarrhea or loose stools. (Patient not taking: Reported on 07/18/2017) 120 tablet 0   No current facility-administered medications for this visit.     OBJECTIVE: Vitals:   07/18/17 1100  BP: 138/72  Pulse: 76  Resp: 18  Temp: 97.8 F (36.6 C)  SpO2: 97%     Body mass index is 27.99 kg/m.    ECOG FS:1 - Symptomatic but completely ambulatory  General: Well-developed, well-nourished, no acute distress. Eyes: Pink conjunctiva, anicteric sclera. Lungs: Clear to auscultation bilaterally. Heart: Regular rate and rhythm. No rubs, murmurs, or gallops. Abdomen: Soft, nontender, nondistended. No organomegaly noted, normoactive bowel sounds. Musculoskeletal: No edema, cyanosis, or clubbing. Left arm in cast. Neuro: Alert, answering all questions appropriately. Cranial nerves grossly intact. Skin: Maculopapular rash noted, Improved. Psych: Normal affect.    LAB RESULTS:  Lab Results  Component Value Date   NA 137 07/18/2017   K 4.0 07/18/2017   CL 103 07/18/2017   CO2 24 07/18/2017   GLUCOSE 115 (H) 07/18/2017   BUN 13 07/18/2017   CREATININE 0.95 07/18/2017   CALCIUM 8.9 07/18/2017   PROT 6.7 07/18/2017   ALBUMIN 3.1 (L) 07/18/2017   AST 13 (L) 07/18/2017   ALT 5 (L) 07/18/2017   ALKPHOS 94 07/18/2017   BILITOT 0.4 07/18/2017   GFRNONAA 54 (L) 07/18/2017   GFRAA >60 07/18/2017    Lab Results  Component Value Date   WBC 8.7 07/18/2017   NEUTROABS 6.5 07/18/2017   HGB 10.0 (L) 07/18/2017   HCT 30.0 (L) 07/18/2017   MCV 77.8 (L) 07/18/2017   PLT 257 07/18/2017     STUDIES: No results found.  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  PLAN:    1. Stage IVA squamous  cell carcinoma of the left lung: PET scan from May 04, 2017 reviewed independently and reported as above.  Overall she has improvement of disease burden, but the pleural-based lesion in her left anterior fourth rib appears to be worse.  Will discuss further at cancer conference tomorrow.  Patient refused an MRI of the brain, but did have a head CT on January 02, 2017 that did not report metastatic disease. Treatment was initially delayed secondary to her broken arm.  Given the multiple problems she has had with chemotherapy treatment, including declining performance status, this was discontinued and proceeded with second line Keytruda.  Patient will receive this every 3 weeks until intolerable side effects or progression of disease. Proceed with cycle 6 of single agent Keytruda.    Patient is going on a trip, therefore she will return to clinic in 4 weeks for consideration of cycle 7.   2. Broken arm: Continue follow-up with orthopedics as scheduled.  Patient reports that she likely does not require surgery. 3. Pain: Patient does not complain of this today.  Continue current narcotic regimen. 4.  Anemia: Mild, monitor.  Approximately 30 minutes was spent in discussion of which greater than 50% was consultation.   Patient expressed understanding and was in agreement with this plan. She also understands that She can  call clinic at any time with any questions, concerns, or complaints.   Cancer Staging Squamous cell carcinoma lung, left ([place]) Staging form: Lung, AJCC 8th Edition - Clinical stage from 12/13/2016: Stage IVA (cT4, cN0, cM1a) - Signed by [name], MD on 12/13/2016   [name], MD   07/19/2017 8:21 PM

## 2017-07-16 ENCOUNTER — Ambulatory Visit
Admission: RE | Admit: 2017-07-16 | Discharge: 2017-07-16 | Disposition: A | Discharge status: Home / Self Care | Payer: Medicare HMO | Source: Ambulatory Visit | Attending: Radiation Oncology | Admitting: Radiation Oncology

## 2017-07-16 DIAGNOSIS — Z510 Encounter for antineoplastic radiation therapy: Secondary | ICD-10-CM | POA: Diagnosis not present

## 2017-07-17 ENCOUNTER — Other Ambulatory Visit: Payer: Self-pay | Admitting: Oncology

## 2017-07-17 ENCOUNTER — Ambulatory Visit
Admission: RE | Admit: 2017-07-17 | Discharge: 2017-07-17 | Disposition: A | Discharge status: Home / Self Care | Payer: Medicare HMO | Source: Ambulatory Visit | Attending: Radiation Oncology | Admitting: Radiation Oncology

## 2017-07-17 DIAGNOSIS — Z510 Encounter for antineoplastic radiation therapy: Secondary | ICD-10-CM | POA: Diagnosis not present

## 2017-07-18 ENCOUNTER — Inpatient Hospital Stay: Payer: Medicare HMO | Attending: Oncology

## 2017-07-18 ENCOUNTER — Other Ambulatory Visit: Payer: Self-pay | Admitting: *Deleted

## 2017-07-18 ENCOUNTER — Inpatient Hospital Stay (HOSPITAL_BASED_OUTPATIENT_CLINIC_OR_DEPARTMENT_OTHER): Payer: Medicare HMO | Admitting: Oncology

## 2017-07-18 ENCOUNTER — Ambulatory Visit
Admission: RE | Admit: 2017-07-18 | Discharge: 2017-07-18 | Disposition: A | Discharge status: Home / Self Care | Payer: Medicare HMO | Source: Ambulatory Visit | Attending: Radiation Oncology | Admitting: Radiation Oncology

## 2017-07-18 ENCOUNTER — Other Ambulatory Visit: Payer: Self-pay | Admitting: Oncology

## 2017-07-18 ENCOUNTER — Inpatient Hospital Stay: Payer: Medicare HMO

## 2017-07-18 VITALS — BP 138/72 | HR 76 | Temp 97.8°F | Resp 18 | Wt 158.0 lb

## 2017-07-18 DIAGNOSIS — M549 Dorsalgia, unspecified: Secondary | ICD-10-CM | POA: Diagnosis not present

## 2017-07-18 DIAGNOSIS — Z7901 Long term (current) use of anticoagulants: Secondary | ICD-10-CM | POA: Insufficient documentation

## 2017-07-18 DIAGNOSIS — I4891 Unspecified atrial fibrillation: Secondary | ICD-10-CM | POA: Insufficient documentation

## 2017-07-18 DIAGNOSIS — C3492 Malignant neoplasm of unspecified part of left bronchus or lung: Secondary | ICD-10-CM

## 2017-07-18 DIAGNOSIS — R530 Neoplastic (malignant) related fatigue: Secondary | ICD-10-CM | POA: Diagnosis not present

## 2017-07-18 DIAGNOSIS — Z9221 Personal history of antineoplastic chemotherapy: Secondary | ICD-10-CM

## 2017-07-18 DIAGNOSIS — D649 Anemia, unspecified: Secondary | ICD-10-CM | POA: Diagnosis not present

## 2017-07-18 DIAGNOSIS — Z923 Personal history of irradiation: Secondary | ICD-10-CM | POA: Insufficient documentation

## 2017-07-18 DIAGNOSIS — Z510 Encounter for antineoplastic radiation therapy: Secondary | ICD-10-CM | POA: Diagnosis present

## 2017-07-18 DIAGNOSIS — Z79899 Other long term (current) drug therapy: Secondary | ICD-10-CM

## 2017-07-18 DIAGNOSIS — R531 Weakness: Secondary | ICD-10-CM | POA: Insufficient documentation

## 2017-07-18 DIAGNOSIS — Z87891 Personal history of nicotine dependence: Secondary | ICD-10-CM | POA: Insufficient documentation

## 2017-07-18 DIAGNOSIS — C3490 Malignant neoplasm of unspecified part of unspecified bronchus or lung: Secondary | ICD-10-CM

## 2017-07-18 LAB — COMPREHENSIVE METABOLIC PANEL
ALT: 5 U/L — ABNORMAL LOW (ref 14–54)
ANION GAP: 10 (ref 5–15)
AST: 13 U/L — ABNORMAL LOW (ref 15–41)
Albumin: 3.1 g/dL — ABNORMAL LOW (ref 3.5–5.0)
Alkaline Phosphatase: 94 U/L (ref 38–126)
BUN: 13 mg/dL (ref 6–20)
CALCIUM: 8.9 mg/dL (ref 8.9–10.3)
CHLORIDE: 103 mmol/L (ref 101–111)
CO2: 24 mmol/L (ref 22–32)
Creatinine, Ser: 0.95 mg/dL (ref 0.44–1.00)
GFR calc non Af Amer: 54 mL/min — ABNORMAL LOW (ref >60)
Glucose, Bld: 115 mg/dL — ABNORMAL HIGH (ref 65–99)
Potassium: 4 mmol/L (ref 3.5–5.1)
SODIUM: 137 mmol/L (ref 135–145)
Total Bilirubin: 0.4 mg/dL (ref 0.3–1.2)
Total Protein: 6.7 g/dL (ref 6.5–8.1)

## 2017-07-18 LAB — CBC WITH DIFFERENTIAL/PLATELET
BASOS PCT: 1 %
Basophils Absolute: 0.1 10*3/uL (ref 0–0.1)
Eosinophils Absolute: 0.7 10*3/uL (ref 0–0.7)
Eosinophils Relative: 8 %
HEMATOCRIT: 30 % — AB (ref 35.0–47.0)
HEMOGLOBIN: 10 g/dL — AB (ref 12.0–16.0)
LYMPHS ABS: 0.6 10*3/uL — AB (ref 1.0–3.6)
Lymphocytes Relative: 7 %
MCH: 25.8 pg — ABNORMAL LOW (ref 26.0–34.0)
MCHC: 33.2 g/dL (ref 32.0–36.0)
MCV: 77.8 fL — ABNORMAL LOW (ref 80.0–100.0)
MONOS PCT: 9 %
Monocytes Absolute: 0.8 10*3/uL (ref 0.2–0.9)
NEUTROS ABS: 6.5 10*3/uL (ref 1.4–6.5)
NEUTROS PCT: 75 %
Platelets: 257 10*3/uL (ref 150–440)
RBC: 3.86 MIL/uL (ref 3.80–5.20)
RDW: 15.9 % — ABNORMAL HIGH (ref 11.5–14.5)
WBC: 8.7 10*3/uL (ref 3.6–11.0)

## 2017-07-18 MED ORDER — HYDROCODONE-ACETAMINOPHEN 5-325 MG PO TABS: 1.0000 | ORAL_TABLET | ORAL | 0 refills | Status: DC | PRN

## 2017-07-18 NOTE — Progress Notes (Signed)
Pt in for follow up, reports has had cough with yellow sputum production along with lower back pain.

## 2017-07-19 ENCOUNTER — Ambulatory Visit
Admission: RE | Admit: 2017-07-19 | Discharge: 2017-07-19 | Disposition: A | Discharge status: Home / Self Care | Payer: Medicare HMO | Source: Ambulatory Visit | Attending: Radiation Oncology | Admitting: Radiation Oncology

## 2017-07-19 DIAGNOSIS — Z510 Encounter for antineoplastic radiation therapy: Secondary | ICD-10-CM | POA: Diagnosis not present

## 2017-07-19 LAB — THYROID PANEL WITH TSH
FREE THYROXINE INDEX: 2.7 (ref 1.2–4.9)
T3 Uptake Ratio: 29 % (ref 24–39)
T4 TOTAL: 9.2 ug/dL (ref 4.5–12.0)
TSH: 0.936 u[IU]/mL (ref 0.450–4.500)

## 2017-07-20 ENCOUNTER — Ambulatory Visit
Admission: RE | Admit: 2017-07-20 | Discharge: 2017-07-20 | Disposition: A | Discharge status: Home / Self Care | Payer: Medicare HMO | Source: Ambulatory Visit | Attending: Radiation Oncology | Admitting: Radiation Oncology

## 2017-07-20 DIAGNOSIS — Z510 Encounter for antineoplastic radiation therapy: Secondary | ICD-10-CM | POA: Diagnosis not present

## 2017-07-23 ENCOUNTER — Ambulatory Visit
Admission: RE | Admit: 2017-07-23 | Discharge: 2017-07-23 | Disposition: A | Discharge status: Home / Self Care | Payer: Medicare HMO | Source: Ambulatory Visit | Attending: Radiation Oncology | Admitting: Radiation Oncology

## 2017-07-23 ENCOUNTER — Telehealth: Payer: Self-pay | Admitting: Oncology

## 2017-07-23 DIAGNOSIS — Z510 Encounter for antineoplastic radiation therapy: Secondary | ICD-10-CM | POA: Diagnosis not present

## 2017-07-23 NOTE — Telephone Encounter (Signed)
PET rschd and conf with patient's grand daughter, Dominica.  Appt schd for 07/26/17 @ [facility], with the arrival time of 10 a.m. NPO from midnight before. Msg sent to Dr Woodfin Ganja and team.

## 2017-07-24 ENCOUNTER — Ambulatory Visit: Payer: Medicare HMO

## 2017-07-25 ENCOUNTER — Inpatient Hospital Stay: Payer: Medicare HMO

## 2017-07-25 ENCOUNTER — Inpatient Hospital Stay: Payer: Medicare HMO | Admitting: Oncology

## 2017-07-26 ENCOUNTER — Encounter
Admission: RE | Admit: 2017-07-26 | Discharge: 2017-07-26 | Disposition: A | Discharge status: Home / Self Care | Payer: Medicare HMO | Source: Ambulatory Visit | Attending: Oncology | Admitting: Oncology

## 2017-07-26 DIAGNOSIS — C3490 Malignant neoplasm of unspecified part of unspecified bronchus or lung: Secondary | ICD-10-CM | POA: Insufficient documentation

## 2017-07-26 LAB — GLUCOSE, CAPILLARY: Glucose-Capillary: 114 mg/dL — ABNORMAL HIGH (ref 65–99)

## 2017-07-26 MED ORDER — FLUDEOXYGLUCOSE F - 18 (FDG) INJECTION
8.3900 | Freq: Once | INTRAVENOUS | Status: AC | PRN
  Administered 2017-07-26: 8.39 via INTRAVENOUS

## 2017-07-27 ENCOUNTER — Other Ambulatory Visit: Payer: Self-pay | Admitting: Cardiovascular Disease

## 2017-07-29 NOTE — Progress Notes (Signed)
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  INTERVAL HISTORY: Patient returns to clinic today for further evaluation, discussion of her PET scan results, and reinitiation of Keytruda.  She has not completed XRT.  She currently feels well and is asymptomatic.  She does admit to chronic weakness and fatigue, but states this has improved since XRT ended.  She has a good appetite. She has no neurologic complaints.  She denies any pain.  She has no chest pain, shortness of breath, cough, or hemoptysis. She denies any nausea, vomiting, constipation, or diarrhea. She has no urinary complaints.  Patient offers no further specific complaints today.  REVIEW OF SYSTEMS:   Review of Systems  Constitutional: Positive for malaise/fatigue. Negative for fever and weight loss.  Respiratory: Negative for cough, hemoptysis and shortness of breath.   Cardiovascular: Negative.  Negative for chest pain and leg swelling.  Gastrointestinal: Negative.  Negative for abdominal pain and diarrhea.  Genitourinary: Negative.  Negative for flank pain.  Musculoskeletal: Positive for back pain. Negative for falls and joint pain.  Skin: Negative.  Negative for itching and rash.  Neurological: Positive for weakness. Negative for sensory change and focal weakness.  Psychiatric/Behavioral: Negative.  The patient is not nervous/anxious and does not have insomnia.     As per HPI. Otherwise, a complete review of systems is negative.  PAST MEDICAL HISTORY: Past Medical History:  Diagnosis Date  . COPD (chronic obstructive pulmonary disease) ([place])   . GU (gastric peptic ulcer)   . Lung cancer ([place])   . Malignant pleural effusion   . PAF  (paroxysmal atrial fibrillation) ([place]) 10/2016   a. 10/2016 post thoracentesis ; b. 10/2016 Echo: EF 65-70%, mild LVH;  c. Amio/Eliquis initiated (CHA2DSVASc = 3).  . Pneumonia   . Wrist fracture 1993   bilateral    PAST SURGICAL HISTORY: Past Surgical History:  Procedure Laterality Date  . ABDOMINAL HYSTERECTOMY    . CATARACT EXTRACTION W/ INTRAOCULAR LENS  IMPLANT, BILATERAL    . CHEST TUBE INSERTION Left 12/18/2016   Procedure: CHEST TUBE INSERTION;  Surgeon: [name], MD;  Location: [facility] ORS;  Service: General;  Laterality: Left;  . CHOLECYSTECTOMY    . CLOSED REDUCTION WRIST FRACTURE Left 01/04/2017   Procedure: CLOSED REDUCTION WRIST;  Surgeon: Thornton Park, MD;  Location: [facility] ORS;  Service: Orthopedics;  Laterality: Left;  . PORTACATH PLACEMENT Left 12/18/2016   Procedure: INSERTION PORT-A-CATH;  Surgeon: [name], MD;  Location: [facility] ORS;  Service: General;  Laterality: Left;  . REMOVAL OF PLEURAL DRAINAGE CATHETER N/A 01/02/2017   Procedure: REMOVAL OF PLEURAL DRAINAGE CATHETER;  Surgeon: [name], MD;  Location: [facility] ORS;  Service: Thoracic;  Laterality: N/A;  . TONSILLECTOMY      FAMILY HISTORY: Family History  Problem Relation Age of Onset  . Hypertension Father   . Heart disease Father   . Heart attack Father   . Congestive Heart Failure Mother   . Stroke Mother   . Atrial fibrillation Sister     ADVANCED DIRECTIVES (Y/N):  N  HEALTH MAINTENANCE: Social History   Tobacco Use  . Smoking status: Former Smoker    Packs/day: 1.00    Years: 68.00    Pack years: 68.00  Last attempt to quit: 11/06/2016    Years since quitting: 0.7  . Smokeless tobacco: Never Used  Substance Use Topics  . Alcohol use: No  . Drug use: No     Colonoscopy:  PAP:  Bone density:  Lipid panel:  Allergies  Allergen Reactions  . Sulfa Antibiotics Other (See Comments)    Seizures   . Levaquin [Levofloxacin] Hives and Itching    Current Outpatient  Medications  Medication Sig Dispense Refill  . apixaban (ELIQUIS) 5 MG TABS tablet Take 1 tablet (5 mg total) by mouth 2 (two) times daily. 60 tablet 0  . BIOTIN PO Take by mouth daily.    . cetirizine (ZYRTEC) 10 MG tablet Take 10 mg by mouth daily as needed for allergies.    Marland Kitchen diltiazem (CARDIZEM CD) 120 MG 24 hr capsule Take 1 capsule (120 mg total) by mouth daily.    Marland Kitchen escitalopram (LEXAPRO) 5 MG tablet Take 5 mg by mouth daily.    . furosemide (LASIX) 20 MG tablet Take 1 tablet by mouth daily as needed. For EDEMA  3  . lidocaine-prilocaine (EMLA) cream APPLY TO AFFECTED AREA ONCE  3  . mirtazapine (REMERON) 7.5 MG tablet Take 7.5 mg by mouth at bedtime.    . Multiple Vitamins-Minerals (MULTIVITAMIN ADULT PO) Take by mouth.    Marland Kitchen omeprazole (PRILOSEC) 20 MG capsule Take 1 capsule (20 mg total) by mouth daily. 90 capsule 2  . potassium chloride SA (K-DUR,KLOR-CON) 20 MEQ tablet Take 20 mEq 2 (two) times daily by mouth.    . cholecalciferol (VITAMIN D) 1000 units tablet Take 1,000 Units by mouth daily.    . diphenoxylate-atropine (LOMOTIL) 2.5-0.025 MG tablet Take 1 tablet 4 (four) times daily as needed by mouth for diarrhea or loose stools. (Patient not taking: Reported on 07/18/2017) 120 tablet 0  . HYDROcodone-acetaminophen (NORCO) 5-325 MG tablet Take 1 tablet by mouth every 4 (four) hours as needed for moderate pain. (Patient not taking: Reported on 07/31/2017) 30 tablet 0  . ondansetron (ZOFRAN) 8 MG tablet TAKE 1 TABLET TWO TIMES DAILY AS NEEDED FOR REFRACTORY NAUSEA OR VOMITING.  (Patient not taking: Reported on 07/31/2017) 120 tablet 0  . prochlorperazine (COMPAZINE) 10 MG tablet Take 1 tablet (10 mg total) by mouth every 6 (six) hours as needed (Nausea or vomiting). (Patient not taking: Reported on 07/31/2017) 60 tablet 2   No current facility-administered medications for this visit.     OBJECTIVE: Vitals:   07/31/17 1052  BP: 127/65  Pulse: 73  Resp: 18  Temp: (!) 97.3 F (36.3 C)      Body mass index is 28.18 kg/m.    ECOG FS:0 - Asymptomatic  General: Well-developed, well-nourished, no acute distress. Eyes: Pink conjunctiva, anicteric sclera. HEENT: Normocephalic, moist mucous membranes, clear oropharnyx. Lungs: Clear to auscultation bilaterally. Heart: Regular rate and rhythm. No rubs, murmurs, or gallops. Abdomen: Soft, nontender, nondistended. No organomegaly noted, normoactive bowel sounds. Musculoskeletal: No edema, cyanosis, or clubbing. Neuro: Alert, answering all questions appropriately. Cranial nerves grossly intact. Skin: No rashes or petechiae noted. Psych: Normal affect.  LAB RESULTS:  Lab Results  Component Value Date   NA 138 07/31/2017   K 4.0 07/31/2017   CL 104 07/31/2017   CO2 24 07/31/2017   GLUCOSE 104 (H) 07/31/2017   BUN 16 07/31/2017   CREATININE 0.84 07/31/2017   CALCIUM 9.1 07/31/2017   PROT 6.6 07/31/2017   ALBUMIN 3.2 (L) 07/31/2017   AST 15 07/31/2017   ALT  7 (L) 07/31/2017   ALKPHOS 82 07/31/2017   BILITOT 0.4 07/31/2017   GFRNONAA >60 07/31/2017   GFRAA >60 07/31/2017    Lab Results  Component Value Date   WBC 7.4 07/31/2017   NEUTROABS 5.2 07/31/2017   HGB 10.0 (L) 07/31/2017   HCT 30.0 (L) 07/31/2017   MCV 78.0 (L) 07/31/2017   PLT 243 07/31/2017     STUDIES: Nm Pet Image Restag (ps) Skull Base To Thigh  Result Date: 07/26/2017 CLINICAL DATA:  Subsequent treatment strategy for lung cancer. EXAM: NUCLEAR MEDICINE PET SKULL BASE TO THIGH TECHNIQUE: 8.39 mCi F-18 FDG was injected intravenously. Full-ring PET imaging was performed from the skull base to thigh after the radiotracer. CT data was obtained and used for attenuation correction and anatomic localization. Fasting blood glucose: 114 mg/dl COMPARISON:  05/04/2017 FINDINGS: Mediastinal blood pool activity: SUV max 2.57 NECK: No hypermetabolic lymph nodes in the neck. Incidental CT findings: none CHEST: Index enlarged right hilar lymph node measures 1.3 cm and  has an SUV max equal to 2.6. Previously 1.5 cm within SUV max of 2.9. The paramediastinal mass posterior to the left infrahilar vascular structures measures approximately 4.3 by 2.3 cm and has an SUV max of 21.6. Previously this measured approximately 4.7 by 3.0 cm and had an SUV max of 37.5. The adjacent left lower lobe lung nodule measures 8 mm on today's exam within SUV max of 2.23. Previously this measured 1.4 cm within SUV max of 8.26. Left anterior chest wall mass involving the left fourth rib anteriorly is again noted. On today's study this measures 3.7 x 4.4 cm and has an SUV max of 7.7. Previously 3.2 x 4.0 cm within SUV max of 15.3. Similar appearance of diffuse pleuroparenchymal thickening and scarring overlying the left lung which is only mildly hypermetabolic with an SUV max of 2.7 on the current study. Previously 3.7. Mild FDG uptake is again noted within left axillary and left retropectoral lymph nodes. Index left axillary node has an SUV max of 2.3. Previously 2.19. Incidental CT findings: Aortic atherosclerosis. There is a left chest wall port a catheter which terminates in the SVC. ABDOMEN/PELVIS: Bilateral low-attenuation adrenal nodules are again noted compatible with adenomas. There is a new focus of increased uptake within the left adrenal gland within SUV max of 4.4. No abnormal radiotracer activity within the liver, spleen, pancreas or right adrenal gland. No hypermetabolic lymph nodes within the abdomen. Right external iliac lymph node measures 1.2 cm and has an SUV max of 5.23. Previously this measured 1 cm and had an SUV max of 1.72. Additionally there are several enlarging right inguinal lymph nodes which exhibit increased uptake. Index node measures 1.1 cm within SUV max of 4.27. Previously this measured 0.6 cm within SUV max 1.34. Incidental CT findings: Aortic atherosclerosis. Between the celiac and SMA there is focal ectasia of the abdominal aorta at the level of the renal arteries  measuring 2.9 cm, image 144/3. SKELETON: No focal hypermetabolic activity to suggest skeletal metastasis. No abnormal uptake corresponding to the treated left tenth rib lesion. Incidental CT findings: none IMPRESSION: 1. Mixed response to therapy. Central left paramediastinal mass posterior to the left infrahilar vasculature has decreased in size in the interval. Continued intense FDG uptake associated with this mass is slightly improved from previous exam. 2. Left lower lobe pulmonary nodule is decreased in size in the interval with significant interval decrease in associated FDG uptake. 3. The left ventral chest wall mass involving the left  rib has increased in size in the interval. The degree of FDG uptake associated with this mass however is decreased from previous exam. 4. There is a new focus of moderate uptake the within previously noted benign left adrenal gland adenoma. Collision tumor cannot be excluded. 5. Enlarging right inguinal and external iliac lymph nodes with increased FDG uptake compared with previous exam moderate increased are identified. This would be an unusual drainage pattern for lung cancer in may reflect reactive adenopathy. Metastatic adenopathy however cannot be entirely excluded. 6.  Aortic atherosclerosis. 7. Increase caliber of the abdominal aorta at the level of the renal arteries measuring 2.9 cm. Ectatic abdominal aorta at risk for aneurysm development. Recommend followup by ultrasound in 5 years. This recommendation follows ACR consensus guidelines: White Paper of the ACR Incidental Findings Committee II on Vascular Findings. J Am Coll Radiol 2013; 10:789-794. Electronically Signed   By: Kerby Moors M.D.   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  PLAN:    1. 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  She has now completed her XRT. Given the multiple problems she previously had with chemotherapy treatment, including declining performance status, this was discontinued and proceeded with second line Keytruda.  Patient will receive this every 3 weeks until intolerable side effects or progression of disease.  Proceed with cycle 8 of single agent Keytruda today.  Return to clinic in 3 weeks for further evaluation and consideration of cycle 9.   2. Broken arm: Resolved.  Patient does not report any further follow-up with orthopedics. 3. Pain: Patient does not complain of this today.  Continue current narcotic regimen. 4.  Anemia: Patient's hemoglobin is decreased, but stable.  Proceed with treatment as above.  Approximately 30 minutes was spent in discussion of which greater than 50% was consultation.  Patient expressed understanding and was in agreement with this plan. She also understands that She can call clinic at any time with any questions, concerns, or complaints.   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  INTERVAL HISTORY: Patient returns to clinic today for further evaluation and consideration of cycle 4 of Keytruda.  She is tolerating her treatments well without significant side effects.  She currently feels well and is asymptomatic.  She has a good appetite and no longer complains of  weight loss. She does not complain of weakness or fatigue today. She has no neurologic complaints. She does not complain of rib or arm pain today. She has no chest pain, shortness of breath, cough, or hemoptysis. She denies any nausea, vomiting, constipation, or diarrhea. She has no urinary complaints. Patient offers no specific complaints today.  REVIEW OF SYSTEMS:   Review of Systems  Constitutional: Negative.  Negative for fever, malaise/fatigue and weight loss.  Respiratory: Negative.  Negative for cough and shortness of breath.   Cardiovascular: Negative.  Negative for chest pain and leg swelling.  Gastrointestinal: Negative.  Negative for abdominal pain and diarrhea.  Genitourinary: Negative.   Musculoskeletal: Negative.  Negative for back pain, falls and joint pain.  Skin: Negative.  Negative for itching and rash.  Neurological: Negative.  Negative for sensory change and weakness.  Psychiatric/Behavioral: Negative.  The patient is not nervous/anxious.     As per HPI. Otherwise, a complete review of systems is negative.  PAST MEDICAL HISTORY: Past Medical History:  Diagnosis Date  . COPD (chronic obstructive pulmonary disease) ([place])   . GU (gastric peptic ulcer)   . Lung cancer ([place])   . Malignant pleural effusion   . PAF (paroxysmal atrial fibrillation) ([place]) 10/2016   a. 10/2016 post thoracentesis ; b. 10/2016 Echo: EF 65-70%, mild LVH;  c. Amio/Eliquis initiated (CHA2DSVASc = 3).  . Pneumonia   . Wrist fracture 1993   bilateral    PAST SURGICAL HISTORY: Past Surgical History:  Procedure Laterality Date  . ABDOMINAL HYSTERECTOMY    . CATARACT EXTRACTION W/ INTRAOCULAR LENS  IMPLANT, BILATERAL    . CHEST TUBE INSERTION Left 12/18/2016   Procedure: CHEST TUBE INSERTION;  Surgeon: [name], MD;  Location: [facility] ORS;  Service: General;  Laterality: Left;  . CHOLECYSTECTOMY    . CLOSED REDUCTION WRIST FRACTURE Left 01/04/2017   Procedure: CLOSED REDUCTION WRIST;  Surgeon:  Thornton Park, MD;  Location: [facility] ORS;  Service: Orthopedics;  Laterality: Left;  . PORTACATH PLACEMENT Left 12/18/2016   Procedure: INSERTION PORT-A-CATH;  Surgeon: [name], MD;  Location: [facility] ORS;  Service: General;  Laterality: Left;  . REMOVAL OF PLEURAL DRAINAGE CATHETER N/A 01/02/2017   Procedure: REMOVAL OF PLEURAL DRAINAGE CATHETER;  Surgeon: [name], MD;  Location: [facility] ORS;  Service: Thoracic;  Laterality: N/A;  . TONSILLECTOMY      FAMILY HISTORY: Family History  Problem Relation Age of Onset  . Hypertension Father   . Heart disease Father   . Heart attack Father   . Congestive Heart Failure Mother   . Stroke Mother   . Atrial fibrillation Sister     ADVANCED DIRECTIVES (Y/N):  N  HEALTH MAINTENANCE: Social History   Tobacco Use  . Smoking status: Former Smoker    Packs/day: 1.00    Years: 68.00    Pack years: 68.00    Last attempt to quit: 11/06/2016    Years since quitting: 0.7  . Smokeless tobacco: Never Used  Substance Use Topics  . Alcohol use: No  . Drug use: No     Colonoscopy:  PAP:  Bone density:  Lipid panel:  Allergies  Allergen Reactions  . Sulfa Antibiotics Other (See Comments)    Seizures   . Levaquin [Levofloxacin] Hives and Itching    Current Outpatient Medications  Medication Sig Dispense Refill  . apixaban (ELIQUIS) 5 MG TABS tablet Take 1 tablet (5 mg total) by mouth 2 (two) times daily. 60 tablet 0  . BIOTIN PO Take by mouth daily.    . cetirizine (ZYRTEC) 10 MG tablet Take 10 mg by mouth daily as needed for allergies.    Marland Kitchen diltiazem (CARDIZEM CD) 120 MG 24 hr capsule Take 1 capsule (120 mg total) by mouth daily.    Marland Kitchen escitalopram (LEXAPRO) 5 MG tablet Take 5 mg by mouth daily.    . furosemide (LASIX) 20 MG tablet  Take 1 tablet by mouth daily as needed. For EDEMA  3  . lidocaine-prilocaine (EMLA) cream APPLY TO AFFECTED AREA ONCE  3  . mirtazapine (REMERON) 7.5 MG tablet Take 7.5 mg by mouth at bedtime.    .  Multiple Vitamins-Minerals (MULTIVITAMIN ADULT PO) Take by mouth.    Marland Kitchen omeprazole (PRILOSEC) 20 MG capsule Take 1 capsule (20 mg total) by mouth daily. 90 capsule 2  . potassium chloride SA (K-DUR,KLOR-CON) 20 MEQ tablet Take 20 mEq 2 (two) times daily by mouth.    . cholecalciferol (VITAMIN D) 1000 units tablet Take 1,000 Units by mouth daily.    . diphenoxylate-atropine (LOMOTIL) 2.5-0.025 MG tablet Take 1 tablet 4 (four) times daily as needed by mouth for diarrhea or loose stools. (Patient not taking: Reported on 07/18/2017) 120 tablet 0  . HYDROcodone-acetaminophen (NORCO) 5-325 MG tablet Take 1 tablet by mouth every 4 (four) hours as needed for moderate pain. (Patient not taking: Reported on 07/31/2017) 30 tablet 0  . ondansetron (ZOFRAN) 8 MG tablet TAKE 1 TABLET TWO TIMES DAILY AS NEEDED FOR REFRACTORY NAUSEA OR VOMITING.  (Patient not taking: Reported on 07/31/2017) 120 tablet 0  . prochlorperazine (COMPAZINE) 10 MG tablet Take 1 tablet (10 mg total) by mouth every 6 (six) hours as needed (Nausea or vomiting). (Patient not taking: Reported on 07/31/2017) 60 tablet 2   No current facility-administered medications for this visit.     OBJECTIVE: Vitals:   07/31/17 1052  BP: 127/65  Pulse: 73  Resp: 18  Temp: (!) 97.3 F (36.3 C)     Body mass index is 28.18 kg/m.    ECOG FS:1 - Symptomatic but completely ambulatory  General: Well-developed, well-nourished, no acute distress. Eyes: Pink conjunctiva, anicteric sclera. Lungs: Clear to auscultation bilaterally. Heart: Regular rate and rhythm. No rubs, murmurs, or gallops. Abdomen: Soft, nontender, nondistended. No organomegaly noted, normoactive bowel sounds. Musculoskeletal: No edema, cyanosis, or clubbing. Left arm in cast. Neuro: Alert, answering all questions appropriately. Cranial nerves grossly intact. Skin: Maculopapular rash noted, Improved. Psych: Normal affect.    LAB RESULTS:  Lab Results  Component Value Date   NA 138  07/31/2017   K 4.0 07/31/2017   CL 104 07/31/2017   CO2 24 07/31/2017   GLUCOSE 104 (H) 07/31/2017   BUN 16 07/31/2017   CREATININE 0.84 07/31/2017   CALCIUM 9.1 07/31/2017   PROT 6.6 07/31/2017   ALBUMIN 3.2 (L) 07/31/2017   AST 15 07/31/2017   ALT 7 (L) 07/31/2017   ALKPHOS 82 07/31/2017   BILITOT 0.4 07/31/2017   GFRNONAA >60 07/31/2017   GFRAA >60 07/31/2017    Lab Results  Component Value Date   WBC 7.4 07/31/2017   NEUTROABS 5.2 07/31/2017   HGB 10.0 (L) 07/31/2017   HCT 30.0 (L) 07/31/2017   MCV 78.0 (L) 07/31/2017   PLT 243 07/31/2017     STUDIES: Nm Pet Image Restag (ps) Skull Base To Thigh  Result Date: 07/26/2017 CLINICAL DATA:  Subsequent treatment strategy for lung cancer. EXAM: NUCLEAR MEDICINE PET SKULL BASE TO THIGH TECHNIQUE: 8.39 mCi F-18 FDG was injected intravenously. Full-ring PET imaging was performed from the skull base to thigh after the radiotracer. CT data was obtained and used for attenuation correction and anatomic localization. Fasting blood glucose: 114 mg/dl COMPARISON:  05/04/2017 FINDINGS: Mediastinal blood pool activity: SUV max 2.57 NECK: No hypermetabolic lymph nodes in the neck. Incidental CT findings: none CHEST: Index enlarged right hilar lymph node measures 1.3 cm and has  an SUV max equal to 2.6. Previously 1.5 cm within SUV max of 2.9. The paramediastinal mass posterior to the left infrahilar vascular structures measures approximately 4.3 by 2.3 cm and has an SUV max of 21.6. Previously this measured approximately 4.7 by 3.0 cm and had an SUV max of 37.5. The adjacent left lower lobe lung nodule measures 8 mm on today's exam within SUV max of 2.23. Previously this measured 1.4 cm within SUV max of 8.26. Left anterior chest wall mass involving the left fourth rib anteriorly is again noted. On today's study this measures 3.7 x 4.4 cm and has an SUV max of 7.7. Previously 3.2 x 4.0 cm within SUV max of 15.3. Similar appearance of diffuse  pleuroparenchymal thickening and scarring overlying the left lung which is only mildly hypermetabolic with an SUV max of 2.7 on the current study. Previously 3.7. Mild FDG uptake is again noted within left axillary and left retropectoral lymph nodes. Index left axillary node has an SUV max of 2.3. Previously 2.19. Incidental CT findings: Aortic atherosclerosis. There is a left chest wall port a catheter which terminates in the SVC. ABDOMEN/PELVIS: Bilateral low-attenuation adrenal nodules are again noted compatible with adenomas. There is a new focus of increased uptake within the left adrenal gland within SUV max of 4.4. No abnormal radiotracer activity within the liver, spleen, pancreas or right adrenal gland. No hypermetabolic lymph nodes within the abdomen. Right external iliac lymph node measures 1.2 cm and has an SUV max of 5.23. Previously this measured 1 cm and had an SUV max of 1.72. Additionally there are several enlarging right inguinal lymph nodes which exhibit increased uptake. Index node measures 1.1 cm within SUV max of 4.27. Previously this measured 0.6 cm within SUV max 1.34. Incidental CT findings: Aortic atherosclerosis. Between the celiac and SMA there is focal ectasia of the abdominal aorta at the level of the renal arteries measuring 2.9 cm, image 144/3. SKELETON: No focal hypermetabolic activity to suggest skeletal metastasis. No abnormal uptake corresponding to the treated left tenth rib lesion. Incidental CT findings: none IMPRESSION: 1. Mixed response to therapy. Central left paramediastinal mass posterior to the left infrahilar vasculature has decreased in size in the interval. Continued intense FDG uptake associated with this mass is slightly improved from previous exam. 2. Left lower lobe pulmonary nodule is decreased in size in the interval with significant interval decrease in associated FDG uptake. 3. The left ventral chest wall mass involving the left rib has increased in size in the  interval. The degree of FDG uptake associated with this mass however is decreased from previous exam. 4. There is a new focus of moderate uptake the within previously noted benign left adrenal gland adenoma. Collision tumor cannot be excluded. 5. Enlarging right inguinal and external iliac lymph nodes with increased FDG uptake compared with previous exam moderate increased are identified. This would be an unusual drainage pattern for lung cancer in may reflect reactive adenopathy. Metastatic adenopathy however cannot be entirely excluded. 6.  Aortic atherosclerosis. 7. Increase caliber of the abdominal aorta at the level of the renal arteries measuring 2.9 cm. Ectatic abdominal aorta at risk for aneurysm development. Recommend followup by ultrasound in 5 years. This recommendation follows ACR consensus guidelines: White Paper of the ACR Incidental Findings Committee II on Vascular Findings. J Am Coll Radiol 2013; 10:789-794. Electronically Signed   By: Kerby Moors M.D.   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  PLAN:    1. 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  Overall she has improvement of disease burden, but the pleural-based lesion in her left anterior fourth rib appears to be worse.  Will discuss further at cancer conference tomorrow.  Patient refused an MRI of the brain, but did have a head CT on January 02, 2017 that did not report metastatic disease. Treatment was initially delayed secondary to her broken arm.  Given the multiple problems she has had with chemotherapy treatment, including declining performance status, this was discontinued and proceeded with second line Keytruda.  Patient will receive this every 3 weeks until intolerable side effects or progression of disease. Proceed with cycle 6 of single agent Keytruda.    Patient is going on a  trip, therefore she will return to clinic in 4 weeks for consideration of cycle 7.   2. Broken arm: Continue follow-up with orthopedics as scheduled.  Patient reports that she likely does not require surgery. 3. Pain: Patient does not complain of this today.  Continue current narcotic regimen. 4.  Anemia: Mild, monitor.  Approximately 30 minutes was spent in discussion of which greater than 50% was consultation.   Patient expressed understanding and was in agreement with this plan. She also understands that She can call clinic at any time with any questions, concerns, or complaints.   Cancer Staging Squamous cell carcinoma lung, left ([place]) Staging form: Lung, AJCC 8th Edition - Clinical stage from 12/13/2016: Stage IVA (cT4, cN0, cM1a) - Signed by [name], MD on 12/13/2016   [name], MD   07/31/2017 4:42 PM

## 2017-07-30 ENCOUNTER — Other Ambulatory Visit: Payer: Self-pay | Admitting: Oncology

## 2017-07-31 ENCOUNTER — Inpatient Hospital Stay (HOSPITAL_BASED_OUTPATIENT_CLINIC_OR_DEPARTMENT_OTHER): Payer: Medicare HMO | Admitting: Oncology

## 2017-07-31 ENCOUNTER — Inpatient Hospital Stay: Payer: Medicare HMO

## 2017-07-31 ENCOUNTER — Encounter: Payer: Self-pay | Admitting: Oncology

## 2017-07-31 ENCOUNTER — Other Ambulatory Visit: Payer: Self-pay

## 2017-07-31 VITALS — BP 127/65 | HR 73 | Temp 97.3°F | Resp 18 | Wt 159.1 lb

## 2017-07-31 DIAGNOSIS — R530 Neoplastic (malignant) related fatigue: Secondary | ICD-10-CM | POA: Diagnosis not present

## 2017-07-31 DIAGNOSIS — Z87891 Personal history of nicotine dependence: Secondary | ICD-10-CM | POA: Diagnosis not present

## 2017-07-31 DIAGNOSIS — D649 Anemia, unspecified: Secondary | ICD-10-CM

## 2017-07-31 DIAGNOSIS — Z9221 Personal history of antineoplastic chemotherapy: Secondary | ICD-10-CM

## 2017-07-31 DIAGNOSIS — M549 Dorsalgia, unspecified: Secondary | ICD-10-CM

## 2017-07-31 DIAGNOSIS — Z7901 Long term (current) use of anticoagulants: Secondary | ICD-10-CM

## 2017-07-31 DIAGNOSIS — R531 Weakness: Secondary | ICD-10-CM

## 2017-07-31 DIAGNOSIS — C3492 Malignant neoplasm of unspecified part of left bronchus or lung: Secondary | ICD-10-CM

## 2017-07-31 DIAGNOSIS — Z923 Personal history of irradiation: Secondary | ICD-10-CM

## 2017-07-31 DIAGNOSIS — I480 Paroxysmal atrial fibrillation: Secondary | ICD-10-CM

## 2017-07-31 DIAGNOSIS — Z79899 Other long term (current) drug therapy: Secondary | ICD-10-CM

## 2017-07-31 LAB — CBC WITH DIFFERENTIAL/PLATELET
BASOS ABS: 0.1 10*3/uL (ref 0–0.1)
BASOS PCT: 1 %
EOS PCT: 7 %
Eosinophils Absolute: 0.5 10*3/uL (ref 0–0.7)
HEMATOCRIT: 30 % — AB (ref 35.0–47.0)
Hemoglobin: 10 g/dL — ABNORMAL LOW (ref 12.0–16.0)
Lymphocytes Relative: 11 %
Lymphs Abs: 0.8 10*3/uL — ABNORMAL LOW (ref 1.0–3.6)
MCH: 26.1 pg (ref 26.0–34.0)
MCHC: 33.5 g/dL (ref 32.0–36.0)
MCV: 78 fL — ABNORMAL LOW (ref 80.0–100.0)
MONO ABS: 0.8 10*3/uL (ref 0.2–0.9)
MONOS PCT: 11 %
Neutro Abs: 5.2 10*3/uL (ref 1.4–6.5)
Neutrophils Relative %: 70 %
PLATELETS: 243 10*3/uL (ref 150–440)
RBC: 3.84 MIL/uL (ref 3.80–5.20)
RDW: 16.2 % — ABNORMAL HIGH (ref 11.5–14.5)
WBC: 7.4 10*3/uL (ref 3.6–11.0)

## 2017-07-31 LAB — COMPREHENSIVE METABOLIC PANEL
ALBUMIN: 3.2 g/dL — AB (ref 3.5–5.0)
ALK PHOS: 82 U/L (ref 38–126)
ALT: 7 U/L — AB (ref 14–54)
AST: 15 U/L (ref 15–41)
Anion gap: 10 (ref 5–15)
BILIRUBIN TOTAL: 0.4 mg/dL (ref 0.3–1.2)
BUN: 16 mg/dL (ref 6–20)
CALCIUM: 9.1 mg/dL (ref 8.9–10.3)
CO2: 24 mmol/L (ref 22–32)
CREATININE: 0.84 mg/dL (ref 0.44–1.00)
Chloride: 104 mmol/L (ref 101–111)
GFR calc Af Amer: >60 mL/min (ref >60)
GFR calc non Af Amer: >60 mL/min (ref >60)
GLUCOSE: 104 mg/dL — AB (ref 65–99)
Potassium: 4 mmol/L (ref 3.5–5.1)
SODIUM: 138 mmol/L (ref 135–145)
TOTAL PROTEIN: 6.6 g/dL (ref 6.5–8.1)

## 2017-07-31 MED ORDER — HEPARIN SOD (PORK) LOCK FLUSH 100 UNIT/ML IV SOLN
500.0000 [IU] | Freq: Once | INTRAVENOUS | Status: AC
  Administered 2017-07-31: 500 [IU] via INTRAVENOUS
  Filled 2017-07-31: qty 5

## 2017-07-31 MED ORDER — SODIUM CHLORIDE 0.9 % IV SOLN
200.0000 mg | Freq: Once | INTRAVENOUS | Status: AC
  Administered 2017-07-31: 200 mg via INTRAVENOUS
  Filled 2017-07-31: qty 8

## 2017-07-31 MED ORDER — SODIUM CHLORIDE 0.9% FLUSH
10.0000 mL | Freq: Once | INTRAVENOUS | Status: AC
  Administered 2017-07-31: 10 mL via INTRAVENOUS
  Filled 2017-07-31: qty 10

## 2017-07-31 MED ORDER — SODIUM CHLORIDE 0.9 % IV SOLN
Freq: Once | INTRAVENOUS | Status: AC
  Administered 2017-07-31: 12:00:00 via INTRAVENOUS
  Filled 2017-07-31: qty 1000

## 2017-07-31 NOTE — Progress Notes (Signed)
Here for follow up. Per pt " always tired  " finished radiation last week. Appetite good she stated.

## 2017-08-18 NOTE — Progress Notes (Signed)
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  INTERVAL HISTORY: Patient returns to clinic today for further evaluation and continuation of Keytruda.  Patient has noticed a mild persistent headache and neck pain over the past week.  Feels it is worse in the morning, but is persistent throughout the day.  It is mildly helped with narcotics.  She otherwise feels well.  She continues to have chronic weakness and fatigue. She has a good appetite. She has no other neurologic complaints.  She denies any pain.  She has no chest pain, shortness of breath, cough, or hemoptysis. She denies any nausea, vomiting, constipation, or diarrhea. She has no urinary complaints.  Patient offers no further specific complaints today.  REVIEW OF SYSTEMS:   Review of Systems  Constitutional: Positive for malaise/fatigue. Negative for fever and weight loss.  Respiratory: Negative for cough, hemoptysis and shortness of breath.   Cardiovascular: Negative.  Negative for chest pain and leg swelling.  Gastrointestinal: Negative.  Negative for abdominal pain and diarrhea.  Genitourinary: Negative.  Negative for flank pain.  Musculoskeletal: Positive for back pain. Negative for falls and joint pain.  Skin: Negative.  Negative for itching and rash.  Neurological: Positive for weakness and headaches. Negative for sensory change and focal weakness.  Psychiatric/Behavioral: Negative.  The patient is not nervous/anxious and does not have insomnia.     As per HPI. Otherwise, a complete review of systems is negative.  PAST MEDICAL HISTORY: Past Medical History:  Diagnosis Date  . COPD (chronic obstructive pulmonary disease) ([place])   . GU (gastric  peptic ulcer)   . Lung cancer ([place])   . Malignant pleural effusion   . PAF (paroxysmal atrial fibrillation) ([place]) 10/2016   a. 10/2016 post thoracentesis ; b. 10/2016 Echo: EF 65-70%, mild LVH;  c. Amio/Eliquis initiated (CHA2DSVASc = 3).  . Pneumonia   . Wrist fracture 1993   bilateral    PAST SURGICAL HISTORY: Past Surgical History:  Procedure Laterality Date  . ABDOMINAL HYSTERECTOMY    . CATARACT EXTRACTION W/ INTRAOCULAR LENS  IMPLANT, BILATERAL    . CHEST TUBE INSERTION Left 12/18/2016   Procedure: CHEST TUBE INSERTION;  Surgeon: [name], MD;  Location: [facility] ORS;  Service: General;  Laterality: Left;  . CHOLECYSTECTOMY    . CLOSED REDUCTION WRIST FRACTURE Left 01/04/2017   Procedure: CLOSED REDUCTION WRIST;  Surgeon: Thornton Park, MD;  Location: [facility] ORS;  Service: Orthopedics;  Laterality: Left;  . PORTACATH PLACEMENT Left 12/18/2016   Procedure: INSERTION PORT-A-CATH;  Surgeon: [name], MD;  Location: [facility] ORS;  Service: General;  Laterality: Left;  . REMOVAL OF PLEURAL DRAINAGE CATHETER N/A 01/02/2017   Procedure: REMOVAL OF PLEURAL DRAINAGE CATHETER;  Surgeon: [name], MD;  Location: [facility] ORS;  Service: Thoracic;  Laterality: N/A;  . TONSILLECTOMY      FAMILY HISTORY: Family History  Problem Relation Age of Onset  . Hypertension Father   . Heart disease Father   . Heart attack Father   . Congestive Heart Failure Mother   . Stroke Mother   . Atrial fibrillation Sister     ADVANCED DIRECTIVES (Y/N):  N  HEALTH MAINTENANCE: Social History   Tobacco Use  . Smoking status: Former Smoker  Packs/day: 1.00    Years: 68.00    Pack years: 68.00    Last attempt to quit: 11/06/2016    Years since quitting: 0.7  . Smokeless tobacco: Never Used  Substance Use Topics  . Alcohol use: No  . Drug use: No     Colonoscopy:  PAP:  Bone density:  Lipid panel:  Allergies  Allergen Reactions  . Sulfa Antibiotics Other (See Comments)    Seizures     . Levaquin [Levofloxacin] Hives and Itching    Current Outpatient Medications  Medication Sig Dispense Refill  . apixaban (ELIQUIS) 5 MG TABS tablet Take 1 tablet (5 mg total) by mouth 2 (two) times daily. 60 tablet 0  . BIOTIN PO Take by mouth daily.    . cetirizine (ZYRTEC) 10 MG tablet Take 10 mg by mouth daily as needed for allergies.    . cholecalciferol (VITAMIN D) 1000 units tablet Take 1,000 Units by mouth daily.    Marland Kitchen diltiazem (CARDIZEM CD) 120 MG 24 hr capsule Take 1 capsule (120 mg total) by mouth daily.    Marland Kitchen escitalopram (LEXAPRO) 5 MG tablet Take 5 mg by mouth daily.    . furosemide (LASIX) 20 MG tablet Take 1 tablet by mouth daily as needed. For EDEMA  3  . lidocaine-prilocaine (EMLA) cream APPLY TO AFFECTED AREA ONCE  3  . mirtazapine (REMERON) 7.5 MG tablet Take 7.5 mg by mouth at bedtime.    . Multiple Vitamins-Minerals (MULTIVITAMIN ADULT PO) Take by mouth.    Marland Kitchen omeprazole (PRILOSEC) 20 MG capsule Take 1 capsule (20 mg total) by mouth daily. 90 capsule 2  . potassium chloride SA (K-DUR,KLOR-CON) 20 MEQ tablet Take 20 mEq 2 (two) times daily by mouth.    . diphenoxylate-atropine (LOMOTIL) 2.5-0.025 MG tablet Take 1 tablet 4 (four) times daily as needed by mouth for diarrhea or loose stools. (Patient not taking: Reported on 07/18/2017) 120 tablet 0  . HYDROcodone-acetaminophen (NORCO) 5-325 MG tablet Take 1 tablet by mouth every 4 (four) hours as needed for moderate pain. 30 tablet 0  . ondansetron (ZOFRAN) 8 MG tablet TAKE 1 TABLET TWO TIMES DAILY AS NEEDED FOR REFRACTORY NAUSEA OR VOMITING.  (Patient not taking: Reported on 07/31/2017) 120 tablet 0  . prochlorperazine (COMPAZINE) 10 MG tablet Take 1 tablet (10 mg total) by mouth every 6 (six) hours as needed (Nausea or vomiting). (Patient not taking: Reported on 07/31/2017) 60 tablet 2   No current facility-administered medications for this visit.     OBJECTIVE: Vitals:   08/21/17 0939  BP: 121/73  Pulse: 71  Resp: 20   Temp: (!) 97.5 F (36.4 C)     Body mass index is 28.48 kg/m.    ECOG FS:0 - Asymptomatic  General: Well-developed, well-nourished, no acute distress. Eyes: Pink conjunctiva, anicteric sclera. HEENT: Normocephalic, moist mucous membranes, clear oropharnyx. Lungs: Clear to auscultation bilaterally. Heart: Regular rate and rhythm. No rubs, murmurs, or gallops. Abdomen: Soft, nontender, nondistended. No organomegaly noted, normoactive bowel sounds. Musculoskeletal: No edema, cyanosis, or clubbing. Neuro: Alert, answering all questions appropriately. Cranial nerves grossly intact. Skin: No rashes or petechiae noted. Psych: Normal affect.  LAB RESULTS:  Lab Results  Component Value Date   NA 141 08/21/2017   K 3.9 08/21/2017   CL 108 08/21/2017   CO2 26 08/21/2017   GLUCOSE 116 (H) 08/21/2017   BUN 17 08/21/2017   CREATININE 0.82 08/21/2017   CALCIUM 8.9 08/21/2017   PROT 6.6 08/21/2017   ALBUMIN  3.2 (L) 08/21/2017   AST 15 08/21/2017   ALT 7 (L) 08/21/2017   ALKPHOS 98 08/21/2017   BILITOT 0.3 08/21/2017   GFRNONAA >60 08/21/2017   GFRAA >60 08/21/2017    Lab Results  Component Value Date   WBC 7.7 08/21/2017   NEUTROABS 5.3 08/21/2017   HGB 10.2 (L) 08/21/2017   HCT 30.5 (L) 08/21/2017   MCV 76.8 (L) 08/21/2017   PLT 245 08/21/2017     STUDIES: Nm Pet Image Restag (ps) Skull Base To Thigh  Result Date: 07/26/2017 CLINICAL DATA:  Subsequent treatment strategy for lung cancer. EXAM: NUCLEAR MEDICINE PET SKULL BASE TO THIGH TECHNIQUE: 8.39 mCi F-18 FDG was injected intravenously. Full-ring PET imaging was performed from the skull base to thigh after the radiotracer. CT data was obtained and used for attenuation correction and anatomic localization. Fasting blood glucose: 114 mg/dl COMPARISON:  05/04/2017 FINDINGS: Mediastinal blood pool activity: SUV max 2.57 NECK: No hypermetabolic lymph nodes in the neck. Incidental CT findings: none CHEST: Index enlarged right hilar  lymph node measures 1.3 cm and has an SUV max equal to 2.6. Previously 1.5 cm within SUV max of 2.9. The paramediastinal mass posterior to the left infrahilar vascular structures measures approximately 4.3 by 2.3 cm and has an SUV max of 21.6. Previously this measured approximately 4.7 by 3.0 cm and had an SUV max of 37.5. The adjacent left lower lobe lung nodule measures 8 mm on today's exam within SUV max of 2.23. Previously this measured 1.4 cm within SUV max of 8.26. Left anterior chest wall mass involving the left fourth rib anteriorly is again noted. On today's study this measures 3.7 x 4.4 cm and has an SUV max of 7.7. Previously 3.2 x 4.0 cm within SUV max of 15.3. Similar appearance of diffuse pleuroparenchymal thickening and scarring overlying the left lung which is only mildly hypermetabolic with an SUV max of 2.7 on the current study. Previously 3.7. Mild FDG uptake is again noted within left axillary and left retropectoral lymph nodes. Index left axillary node has an SUV max of 2.3. Previously 2.19. Incidental CT findings: Aortic atherosclerosis. There is a left chest wall port a catheter which terminates in the SVC. ABDOMEN/PELVIS: Bilateral low-attenuation adrenal nodules are again noted compatible with adenomas. There is a new focus of increased uptake within the left adrenal gland within SUV max of 4.4. No abnormal radiotracer activity within the liver, spleen, pancreas or right adrenal gland. No hypermetabolic lymph nodes within the abdomen. Right external iliac lymph node measures 1.2 cm and has an SUV max of 5.23. Previously this measured 1 cm and had an SUV max of 1.72. Additionally there are several enlarging right inguinal lymph nodes which exhibit increased uptake. Index node measures 1.1 cm within SUV max of 4.27. Previously this measured 0.6 cm within SUV max 1.34. Incidental CT findings: Aortic atherosclerosis. Between the celiac and SMA there is focal ectasia of the abdominal aorta at the  level of the renal arteries measuring 2.9 cm, image 144/3. SKELETON: No focal hypermetabolic activity to suggest skeletal metastasis. No abnormal uptake corresponding to the treated left tenth rib lesion. Incidental CT findings: none IMPRESSION: 1. Mixed response to therapy. Central left paramediastinal mass posterior to the left infrahilar vasculature has decreased in size in the interval. Continued intense FDG uptake associated with this mass is slightly improved from previous exam. 2. Left lower lobe pulmonary nodule is decreased in size in the interval with significant interval decrease in associated FDG  uptake. 3. The left ventral chest wall mass involving the left rib has increased in size in the interval. The degree of FDG uptake associated with this mass however is decreased from previous exam. 4. There is a new focus of moderate uptake the within previously noted benign left adrenal gland adenoma. Collision tumor cannot be excluded. 5. Enlarging right inguinal and external iliac lymph nodes with increased FDG uptake compared with previous exam moderate increased are identified. This would be an unusual drainage pattern for lung cancer in may reflect reactive adenopathy. Metastatic adenopathy however cannot be entirely excluded. 6.  Aortic atherosclerosis. 7. Increase caliber of the abdominal aorta at the level of the renal arteries measuring 2.9 cm. Ectatic abdominal aorta at risk for aneurysm development. Recommend followup by ultrasound in 5 years. This recommendation follows ACR consensus guidelines: White Paper of the ACR Incidental Findings Committee II on Vascular Findings. J Am Coll Radiol 2013; 10:789-794. Electronically Signed   By: Kerby Moors M.D.   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  PLAN:    1. 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  She has now completed her XRT. Given the multiple problems she previously had with chemotherapy treatment, including declining performance status, this was discontinued and proceeded with second line Keytruda.  Patient will receive this every 3 weeks until intolerable side effects or progression of disease.  Proceed with cycle 9 of single agent Keytruda today.  Return to clinic in 3 weeks for further evaluation and consideration of cycle 10. 2.  Headaches/neck pain: We will get MRI of the cervical neck and head for further evaluation.  Patient will follow-up 1 to 2 days later to discuss the results.   3. Pain: Patient does not complain of this today.  Continue current narcotic regimen. 4.  Anemia: Patient's hemoglobin is decreased, but stable.  Proceed with treatment as above.  Approximately 30 minutes was spent in discussion of which greater than 50% was consultation  Patient expressed understanding and was in agreement with this plan. She also understands that She can call clinic at any time with any questions, concerns, or complaints.   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  INTERVAL HISTORY: Patient returns to clinic today for further evaluation and consideration of cycle 4 of Keytruda.  She is tolerating her treatments well without significant side effects.  She currently feels well  and is asymptomatic.  She has a good appetite and no longer complains of weight loss. She does not complain of weakness or fatigue today. She has no neurologic complaints. She does not complain of rib or arm pain today. She has no chest pain, shortness of breath, cough, or hemoptysis. She denies any nausea, vomiting, constipation, or diarrhea. She  has no urinary complaints. Patient offers no specific complaints today.  REVIEW OF SYSTEMS:   Review of Systems  Constitutional: Negative.  Negative for fever, malaise/fatigue and weight loss.  Respiratory: Negative.  Negative for cough and shortness of breath.   Cardiovascular: Negative.  Negative for chest pain and leg swelling.  Gastrointestinal: Negative.  Negative for abdominal pain and diarrhea.  Genitourinary: Negative.   Musculoskeletal: Negative.  Negative for back pain, falls and joint pain.  Skin: Negative.  Negative for itching and rash.  Neurological: Negative.  Negative for sensory change and weakness.  Psychiatric/Behavioral: Negative.  The patient is not nervous/anxious.     As per HPI. Otherwise, a complete review of systems is negative.  PAST MEDICAL HISTORY: Past Medical History:  Diagnosis Date  . COPD (chronic obstructive pulmonary disease) ([place])   . GU (gastric peptic ulcer)   . Lung cancer ([place])   . Malignant pleural effusion   . PAF (paroxysmal atrial fibrillation) ([place]) 10/2016   a. 10/2016 post thoracentesis ; b. 10/2016 Echo: EF 65-70%, mild LVH;  c. Amio/Eliquis initiated (CHA2DSVASc = 3).  . Pneumonia   . Wrist fracture 1993   bilateral    PAST SURGICAL HISTORY: Past Surgical History:  Procedure Laterality Date  . ABDOMINAL HYSTERECTOMY    . CATARACT EXTRACTION W/ INTRAOCULAR LENS  IMPLANT, BILATERAL    . CHEST TUBE INSERTION Left 12/18/2016   Procedure: CHEST TUBE INSERTION;  Surgeon: [name], MD;  Location: [facility] ORS;  Service: General;  Laterality: Left;  . CHOLECYSTECTOMY    . CLOSED REDUCTION WRIST  FRACTURE Left 01/04/2017   Procedure: CLOSED REDUCTION WRIST;  Surgeon: Thornton Park, MD;  Location: [facility] ORS;  Service: Orthopedics;  Laterality: Left;  . PORTACATH PLACEMENT Left 12/18/2016   Procedure: INSERTION PORT-A-CATH;  Surgeon: [name], MD;  Location: [facility] ORS;  Service: General;  Laterality: Left;  . REMOVAL OF PLEURAL DRAINAGE CATHETER N/A 01/02/2017   Procedure: REMOVAL OF PLEURAL DRAINAGE CATHETER;  Surgeon: [name], MD;  Location: [facility] ORS;  Service: Thoracic;  Laterality: N/A;  . TONSILLECTOMY      FAMILY HISTORY: Family History  Problem Relation Age of Onset  . Hypertension Father   . Heart disease Father   . Heart attack Father   . Congestive Heart Failure Mother   . Stroke Mother   . Atrial fibrillation Sister     ADVANCED DIRECTIVES (Y/N):  N  HEALTH MAINTENANCE: Social History   Tobacco Use  . Smoking status: Former Smoker    Packs/day: 1.00    Years: 68.00    Pack years: 68.00    Last attempt to quit: 11/06/2016    Years since quitting: 0.7  . Smokeless tobacco: Never Used  Substance Use Topics  . Alcohol use: No  . Drug use: No     Colonoscopy:  PAP:  Bone density:  Lipid panel:  Allergies  Allergen Reactions  . Sulfa Antibiotics Other (See Comments)    Seizures   . Levaquin [Levofloxacin] Hives and Itching    Current Outpatient Medications  Medication Sig Dispense Refill  . apixaban (ELIQUIS) 5 MG TABS tablet Take 1 tablet (5 mg total) by mouth 2 (two) times daily. 60 tablet 0  . BIOTIN PO Take by mouth daily.    . cetirizine (ZYRTEC) 10 MG tablet Take 10 mg by mouth daily as needed for allergies.    . cholecalciferol (VITAMIN D) 1000 units tablet Take 1,000 Units by mouth daily.    Marland Kitchen diltiazem (CARDIZEM  CD) 120 MG 24 hr capsule Take 1 capsule (120 mg total) by mouth daily.    Marland Kitchen escitalopram (LEXAPRO) 5 MG tablet Take 5 mg by mouth daily.    . furosemide (LASIX) 20 MG tablet Take 1 tablet by mouth daily as needed. For  EDEMA  3  . lidocaine-prilocaine (EMLA) cream APPLY TO AFFECTED AREA ONCE  3  . mirtazapine (REMERON) 7.5 MG tablet Take 7.5 mg by mouth at bedtime.    . Multiple Vitamins-Minerals (MULTIVITAMIN ADULT PO) Take by mouth.    Marland Kitchen omeprazole (PRILOSEC) 20 MG capsule Take 1 capsule (20 mg total) by mouth daily. 90 capsule 2  . potassium chloride SA (K-DUR,KLOR-CON) 20 MEQ tablet Take 20 mEq 2 (two) times daily by mouth.    . diphenoxylate-atropine (LOMOTIL) 2.5-0.025 MG tablet Take 1 tablet 4 (four) times daily as needed by mouth for diarrhea or loose stools. (Patient not taking: Reported on 07/18/2017) 120 tablet 0  . HYDROcodone-acetaminophen (NORCO) 5-325 MG tablet Take 1 tablet by mouth every 4 (four) hours as needed for moderate pain. 30 tablet 0  . ondansetron (ZOFRAN) 8 MG tablet TAKE 1 TABLET TWO TIMES DAILY AS NEEDED FOR REFRACTORY NAUSEA OR VOMITING.  (Patient not taking: Reported on 07/31/2017) 120 tablet 0  . prochlorperazine (COMPAZINE) 10 MG tablet Take 1 tablet (10 mg total) by mouth every 6 (six) hours as needed (Nausea or vomiting). (Patient not taking: Reported on 07/31/2017) 60 tablet 2   No current facility-administered medications for this visit.     OBJECTIVE: Vitals:   08/21/17 0939  BP: 121/73  Pulse: 71  Resp: 20  Temp: (!) 97.5 F (36.4 C)     Body mass index is 28.48 kg/m.    ECOG FS:1 - Symptomatic but completely ambulatory  General: Well-developed, well-nourished, no acute distress. Eyes: Pink conjunctiva, anicteric sclera. Lungs: Clear to auscultation bilaterally. Heart: Regular rate and rhythm. No rubs, murmurs, or gallops. Abdomen: Soft, nontender, nondistended. No organomegaly noted, normoactive bowel sounds. Musculoskeletal: No edema, cyanosis, or clubbing. Left arm in cast. Neuro: Alert, answering all questions appropriately. Cranial nerves grossly intact. Skin: Maculopapular rash noted, Improved. Psych: Normal affect.    LAB RESULTS:  Lab Results    Component Value Date   NA 141 08/21/2017   K 3.9 08/21/2017   CL 108 08/21/2017   CO2 26 08/21/2017   GLUCOSE 116 (H) 08/21/2017   BUN 17 08/21/2017   CREATININE 0.82 08/21/2017   CALCIUM 8.9 08/21/2017   PROT 6.6 08/21/2017   ALBUMIN 3.2 (L) 08/21/2017   AST 15 08/21/2017   ALT 7 (L) 08/21/2017   ALKPHOS 98 08/21/2017   BILITOT 0.3 08/21/2017   GFRNONAA >60 08/21/2017   GFRAA >60 08/21/2017    Lab Results  Component Value Date   WBC 7.7 08/21/2017   NEUTROABS 5.3 08/21/2017   HGB 10.2 (L) 08/21/2017   HCT 30.5 (L) 08/21/2017   MCV 76.8 (L) 08/21/2017   PLT 245 08/21/2017     STUDIES: Nm Pet Image Restag (ps) Skull Base To Thigh  Result Date: 07/26/2017 CLINICAL DATA:  Subsequent treatment strategy for lung cancer. EXAM: NUCLEAR MEDICINE PET SKULL BASE TO THIGH TECHNIQUE: 8.39 mCi F-18 FDG was injected intravenously. Full-ring PET imaging was performed from the skull base to thigh after the radiotracer. CT data was obtained and used for attenuation correction and anatomic localization. Fasting blood glucose: 114 mg/dl COMPARISON:  05/04/2017 FINDINGS: Mediastinal blood pool activity: SUV max 2.57 NECK: No hypermetabolic lymph nodes in  the neck. Incidental CT findings: none CHEST: Index enlarged right hilar lymph node measures 1.3 cm and has an SUV max equal to 2.6. Previously 1.5 cm within SUV max of 2.9. The paramediastinal mass posterior to the left infrahilar vascular structures measures approximately 4.3 by 2.3 cm and has an SUV max of 21.6. Previously this measured approximately 4.7 by 3.0 cm and had an SUV max of 37.5. The adjacent left lower lobe lung nodule measures 8 mm on today's exam within SUV max of 2.23. Previously this measured 1.4 cm within SUV max of 8.26. Left anterior chest wall mass involving the left fourth rib anteriorly is again noted. On today's study this measures 3.7 x 4.4 cm and has an SUV max of 7.7. Previously 3.2 x 4.0 cm within SUV max of 15.3. Similar  appearance of diffuse pleuroparenchymal thickening and scarring overlying the left lung which is only mildly hypermetabolic with an SUV max of 2.7 on the current study. Previously 3.7. Mild FDG uptake is again noted within left axillary and left retropectoral lymph nodes. Index left axillary node has an SUV max of 2.3. Previously 2.19. Incidental CT findings: Aortic atherosclerosis. There is a left chest wall port a catheter which terminates in the SVC. ABDOMEN/PELVIS: Bilateral low-attenuation adrenal nodules are again noted compatible with adenomas. There is a new focus of increased uptake within the left adrenal gland within SUV max of 4.4. No abnormal radiotracer activity within the liver, spleen, pancreas or right adrenal gland. No hypermetabolic lymph nodes within the abdomen. Right external iliac lymph node measures 1.2 cm and has an SUV max of 5.23. Previously this measured 1 cm and had an SUV max of 1.72. Additionally there are several enlarging right inguinal lymph nodes which exhibit increased uptake. Index node measures 1.1 cm within SUV max of 4.27. Previously this measured 0.6 cm within SUV max 1.34. Incidental CT findings: Aortic atherosclerosis. Between the celiac and SMA there is focal ectasia of the abdominal aorta at the level of the renal arteries measuring 2.9 cm, image 144/3. SKELETON: No focal hypermetabolic activity to suggest skeletal metastasis. No abnormal uptake corresponding to the treated left tenth rib lesion. Incidental CT findings: none IMPRESSION: 1. Mixed response to therapy. Central left paramediastinal mass posterior to the left infrahilar vasculature has decreased in size in the interval. Continued intense FDG uptake associated with this mass is slightly improved from previous exam. 2. Left lower lobe pulmonary nodule is decreased in size in the interval with significant interval decrease in associated FDG uptake. 3. The left ventral chest wall mass involving the left rib has  increased in size in the interval. The degree of FDG uptake associated with this mass however is decreased from previous exam. 4. There is a new focus of moderate uptake the within previously noted benign left adrenal gland adenoma. Collision tumor cannot be excluded. 5. Enlarging right inguinal and external iliac lymph nodes with increased FDG uptake compared with previous exam moderate increased are identified. This would be an unusual drainage pattern for lung cancer in may reflect reactive adenopathy. Metastatic adenopathy however cannot be entirely excluded. 6.  Aortic atherosclerosis. 7. Increase caliber of the abdominal aorta at the level of the renal arteries measuring 2.9 cm. Ectatic abdominal aorta at risk for aneurysm development. Recommend followup by ultrasound in 5 years. This recommendation follows ACR consensus guidelines: White Paper of the ACR Incidental Findings Committee II on Vascular Findings. J Am Coll Radiol 2013; 10:789-794. Electronically Signed   By: Lovena Le  Clovis Riley M.D.   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  PLAN:    1. 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  Overall she has improvement of disease burden, but the pleural-based lesion in her left anterior fourth rib appears to be worse.  Will discuss further at cancer conference tomorrow.  Patient refused an MRI of the brain, but did have a head CT on January 02, 2017 that did not report metastatic disease. Treatment was initially delayed secondary to her broken arm.  Given the multiple problems she has had with chemotherapy treatment, including declining performance status, this was discontinued and proceeded with second line Keytruda.  Patient will receive this every 3 weeks until intolerable side effects or progression of disease. Proceed with cycle 6 of single agent Keytruda.     Patient is going on a trip, therefore she will return to clinic in 4 weeks for consideration of cycle 7.   2. Broken arm: Continue follow-up with orthopedics as scheduled.  Patient reports that she likely does not require surgery. 3. Pain: Patient does not complain of this today.  Continue current narcotic regimen. 4.  Anemia: Mild, monitor.  Approximately 30 minutes was spent in discussion of which greater than 50% was consultation.   Patient expressed understanding and was in agreement with this plan. She also understands that She can call clinic at any time with any questions, concerns, or complaints.   Cancer Staging Squamous cell carcinoma lung, left ([place]) Staging form: Lung, AJCC 8th Edition - Clinical stage from 12/13/2016: Stage IVA (cT4, cN0, cM1a) - Signed by [name], MD on 12/13/2016   [name], MD   08/22/2017 11:23 PM

## 2017-08-21 ENCOUNTER — Inpatient Hospital Stay: Payer: Medicare HMO

## 2017-08-21 ENCOUNTER — Inpatient Hospital Stay: Payer: Medicare HMO | Attending: Oncology

## 2017-08-21 ENCOUNTER — Inpatient Hospital Stay (HOSPITAL_BASED_OUTPATIENT_CLINIC_OR_DEPARTMENT_OTHER): Payer: Medicare HMO | Admitting: Oncology

## 2017-08-21 ENCOUNTER — Encounter: Payer: Self-pay | Admitting: Oncology

## 2017-08-21 ENCOUNTER — Other Ambulatory Visit: Payer: Self-pay | Admitting: *Deleted

## 2017-08-21 VITALS — BP 121/73 | HR 71 | Temp 97.5°F | Resp 20 | Wt 160.8 lb

## 2017-08-21 DIAGNOSIS — Z87891 Personal history of nicotine dependence: Secondary | ICD-10-CM

## 2017-08-21 DIAGNOSIS — C3492 Malignant neoplasm of unspecified part of left bronchus or lung: Secondary | ICD-10-CM

## 2017-08-21 DIAGNOSIS — M542 Cervicalgia: Secondary | ICD-10-CM | POA: Diagnosis not present

## 2017-08-21 DIAGNOSIS — D649 Anemia, unspecified: Secondary | ICD-10-CM

## 2017-08-21 DIAGNOSIS — R51 Headache: Secondary | ICD-10-CM

## 2017-08-21 DIAGNOSIS — I480 Paroxysmal atrial fibrillation: Secondary | ICD-10-CM | POA: Insufficient documentation

## 2017-08-21 DIAGNOSIS — M549 Dorsalgia, unspecified: Secondary | ICD-10-CM | POA: Diagnosis not present

## 2017-08-21 DIAGNOSIS — Z5112 Encounter for antineoplastic immunotherapy: Secondary | ICD-10-CM | POA: Diagnosis present

## 2017-08-21 DIAGNOSIS — Z923 Personal history of irradiation: Secondary | ICD-10-CM | POA: Insufficient documentation

## 2017-08-21 DIAGNOSIS — Z79899 Other long term (current) drug therapy: Secondary | ICD-10-CM | POA: Insufficient documentation

## 2017-08-21 DIAGNOSIS — G4452 New daily persistent headache (NDPH): Secondary | ICD-10-CM

## 2017-08-21 DIAGNOSIS — Z9221 Personal history of antineoplastic chemotherapy: Secondary | ICD-10-CM

## 2017-08-21 DIAGNOSIS — R530 Neoplastic (malignant) related fatigue: Secondary | ICD-10-CM | POA: Insufficient documentation

## 2017-08-21 DIAGNOSIS — Z7901 Long term (current) use of anticoagulants: Secondary | ICD-10-CM | POA: Insufficient documentation

## 2017-08-21 DIAGNOSIS — R531 Weakness: Secondary | ICD-10-CM | POA: Insufficient documentation

## 2017-08-21 LAB — COMPREHENSIVE METABOLIC PANEL
ALT: 7 U/L — ABNORMAL LOW (ref 14–54)
AST: 15 U/L (ref 15–41)
Albumin: 3.2 g/dL — ABNORMAL LOW (ref 3.5–5.0)
Alkaline Phosphatase: 98 U/L (ref 38–126)
Anion gap: 7 (ref 5–15)
BUN: 17 mg/dL (ref 6–20)
CO2: 26 mmol/L (ref 22–32)
CREATININE: 0.82 mg/dL (ref 0.44–1.00)
Calcium: 8.9 mg/dL (ref 8.9–10.3)
Chloride: 108 mmol/L (ref 101–111)
GFR calc Af Amer: >60 mL/min (ref >60)
GFR calc non Af Amer: >60 mL/min (ref >60)
Glucose, Bld: 116 mg/dL — ABNORMAL HIGH (ref 65–99)
POTASSIUM: 3.9 mmol/L (ref 3.5–5.1)
Sodium: 141 mmol/L (ref 135–145)
Total Bilirubin: 0.3 mg/dL (ref 0.3–1.2)
Total Protein: 6.6 g/dL (ref 6.5–8.1)

## 2017-08-21 LAB — CBC WITH DIFFERENTIAL/PLATELET
Basophils Absolute: 0 10*3/uL (ref 0–0.1)
Basophils Relative: 1 %
EOS ABS: 0.6 10*3/uL (ref 0–0.7)
EOS PCT: 7 %
HCT: 30.5 % — ABNORMAL LOW (ref 35.0–47.0)
Hemoglobin: 10.2 g/dL — ABNORMAL LOW (ref 12.0–16.0)
LYMPHS PCT: 15 %
Lymphs Abs: 1.1 10*3/uL (ref 1.0–3.6)
MCH: 25.8 pg — ABNORMAL LOW (ref 26.0–34.0)
MCHC: 33.6 g/dL (ref 32.0–36.0)
MCV: 76.8 fL — ABNORMAL LOW (ref 80.0–100.0)
Monocytes Absolute: 0.6 10*3/uL (ref 0.2–0.9)
Monocytes Relative: 8 %
Neutro Abs: 5.3 10*3/uL (ref 1.4–6.5)
Neutrophils Relative %: 69 %
PLATELETS: 245 10*3/uL (ref 150–440)
RBC: 3.97 MIL/uL (ref 3.80–5.20)
RDW: 15.6 % — ABNORMAL HIGH (ref 11.5–14.5)
WBC: 7.7 10*3/uL (ref 3.6–11.0)

## 2017-08-21 MED ORDER — HYDROCODONE-ACETAMINOPHEN 5-325 MG PO TABS: 1.0000 | ORAL_TABLET | ORAL | 0 refills | Status: DC | PRN

## 2017-08-21 MED ORDER — PEMBROLIZUMAB CHEMO INJECTION 100 MG/4ML
200.0000 mg | Freq: Once | INTRAVENOUS | Status: AC
  Administered 2017-08-21: 200 mg via INTRAVENOUS
  Filled 2017-08-21: qty 8

## 2017-08-21 MED ORDER — SODIUM CHLORIDE 0.9 % IV SOLN
Freq: Once | INTRAVENOUS | Status: AC
  Administered 2017-08-21: 11:00:00 via INTRAVENOUS
  Filled 2017-08-21: qty 1000

## 2017-08-21 MED ORDER — HEPARIN SOD (PORK) LOCK FLUSH 100 UNIT/ML IV SOLN
500.0000 [IU] | Freq: Once | INTRAVENOUS | Status: DC
  Filled 2017-08-21: qty 5

## 2017-08-21 MED ORDER — SODIUM CHLORIDE 0.9% FLUSH
10.0000 mL | INTRAVENOUS | Status: DC | PRN
  Administered 2017-08-21: 10 mL via INTRAVENOUS
  Filled 2017-08-21: qty 10

## 2017-08-21 MED ORDER — HEPARIN SOD (PORK) LOCK FLUSH 100 UNIT/ML IV SOLN
500.0000 [IU] | Freq: Once | INTRAVENOUS | Status: AC | PRN
  Administered 2017-08-21: 500 [IU]

## 2017-08-21 NOTE — Progress Notes (Signed)
Patient reports headache for 5-6 days, improved with Norco. Patient reports these headaches are new, pain in left neck and side of head.

## 2017-08-22 LAB — THYROID PANEL WITH TSH
Free Thyroxine Index: 2.2 (ref 1.2–4.9)
T3 UPTAKE RATIO: 27 % (ref 24–39)
T4 TOTAL: 8.3 ug/dL (ref 4.5–12.0)
TSH: 1.88 u[IU]/mL (ref 0.450–4.500)

## 2017-08-29 ENCOUNTER — Other Ambulatory Visit: Payer: Self-pay | Admitting: Oncology

## 2017-08-29 DIAGNOSIS — C3490 Malignant neoplasm of unspecified part of unspecified bronchus or lung: Secondary | ICD-10-CM

## 2017-08-30 ENCOUNTER — Ambulatory Visit
Admission: RE | Admit: 2017-08-30 | Discharge: 2017-08-30 | Disposition: A | Discharge status: Home / Self Care | Payer: Medicare HMO | Source: Ambulatory Visit | Attending: Radiation Oncology | Admitting: Radiation Oncology

## 2017-08-30 ENCOUNTER — Encounter: Payer: Self-pay | Admitting: Radiation Oncology

## 2017-08-30 ENCOUNTER — Other Ambulatory Visit: Payer: Self-pay

## 2017-08-30 VITALS — BP 126/68 | HR 72 | Temp 97.6°F | Resp 20 | Wt 161.6 lb

## 2017-08-30 DIAGNOSIS — Z923 Personal history of irradiation: Secondary | ICD-10-CM | POA: Diagnosis not present

## 2017-08-30 DIAGNOSIS — C3492 Malignant neoplasm of unspecified part of left bronchus or lung: Secondary | ICD-10-CM | POA: Insufficient documentation

## 2017-08-30 DIAGNOSIS — Z87891 Personal history of nicotine dependence: Secondary | ICD-10-CM | POA: Insufficient documentation

## 2017-08-30 DIAGNOSIS — R51 Headache: Secondary | ICD-10-CM | POA: Diagnosis not present

## 2017-08-30 DIAGNOSIS — C7951 Secondary malignant neoplasm of bone: Secondary | ICD-10-CM | POA: Diagnosis not present

## 2017-08-30 NOTE — Progress Notes (Signed)
Radiation Oncology Follow up Note  Name: [name]   Date:   08/30/2017 MRN:  [id number] DOB: 01/28/1933    This 84 y.o. female presents to the clinic today for follow-up the patient status post palliative radiation therapy to her left ribs as well as previous.palliative course of radiation therapy to lung and ribs inpatient currently on Keytruda.  REFERRING PROVIDER: Albina Billet, MD  HPI: patient is an 84 year old female currently on [place] who is received to palliative courses of radiation therapy to her left lung and ribs. She 1 month ago completed a palliative course of radiation therapy to her left ribs and left lung which she tolerated well. Recent PET CT scan in May showed mixed response to therapy with central left. Mediastinal mass decreased in size and left lower lobe pulmonary nodule decreased in size. There is a left ventral chest wall mass involving the left ribhas increased in size and interval with significant hypermetabolic activity. This area somewhat painful to both touch and motion..she has been having headaches MRI scan of her brain is scheduled for this week. She is otherwise doing well with good appetite and weight being stable. No cough hemoptysis or chest tightness.  COMPLICATIONS OF TREATMENT: none  FOLLOW UP COMPLIANCE: keeps appointments   PHYSICAL EXAM:  BP 126/68   Pulse 72   Temp 97.6 F (36.4 C)   Resp 20   Wt 161 lb 9.6 oz (73.3 kg)   BMI 28.63 kg/m  There is point tenderness in the area of hypermetabolic activity in the PET scan consistent with known progressive metastatic disease. Well-developed well-nourished patient in NAD. HEENT reveals PERLA, EOMI, discs not visualized.  Oral cavity is clear. No oral mucosal lesions are identified. Neck is clear without evidence of cervical or supraclavicular adenopathy. Lungs are clear to A&P. Cardiac examination is essentially unremarkable with regular rate and rhythm without murmur rub or thrill. Abdomen is  benign with no organomegaly or masses noted. Motor sensory and DTR levels are equal and symmetric in the upper and lower extremities. Cranial nerves II through XII are grossly intact. Proprioception is intact. No peripheral adenopathy or edema is identified. No motor or sensory levels are noted. Crude visual fields are within normal range.  RADIOLOGY RESULTS: PET CT scan reviewed and compatible with the above-stated findings  PLAN: the stomach and target this left anterior chest wall mass. Would plan on delivering 3000 cGy in 10 fractions. We'll try to avoid areas of previous overlap fields. Risks and benefits of treatment including skin reaction fatigue and some loss of normal lung volume all were discussed in detail with the patient. She seems to comprehend my treatment plan well. I first this up and ordered CT simulation for next week.  I would like to take this opportunity to thank you for allowing me to participate in the care of your patient.Noreene Filbert, MD

## 2017-08-31 ENCOUNTER — Inpatient Hospital Stay: Payer: Medicare HMO

## 2017-08-31 ENCOUNTER — Ambulatory Visit: Admission: RE | Admit: 2017-08-31 | Payer: Medicare HMO | Source: Ambulatory Visit

## 2017-08-31 ENCOUNTER — Ambulatory Visit
Admission: RE | Admit: 2017-08-31 | Discharge: 2017-08-31 | Disposition: A | Discharge status: Home / Self Care | Payer: Medicare HMO | Source: Ambulatory Visit | Attending: Oncology | Admitting: Oncology

## 2017-08-31 DIAGNOSIS — G4452 New daily persistent headache (NDPH): Secondary | ICD-10-CM | POA: Diagnosis present

## 2017-08-31 DIAGNOSIS — Z95828 Presence of other vascular implants and grafts: Secondary | ICD-10-CM

## 2017-08-31 DIAGNOSIS — Z5112 Encounter for antineoplastic immunotherapy: Secondary | ICD-10-CM | POA: Diagnosis not present

## 2017-08-31 MED ORDER — HEPARIN SOD (PORK) LOCK FLUSH 100 UNIT/ML IV SOLN
500.0000 [IU] | Freq: Once | INTRAVENOUS | Status: AC
  Administered 2017-08-31: 500 [IU] via INTRAVENOUS

## 2017-08-31 MED ORDER — SODIUM CHLORIDE 0.9% FLUSH
10.0000 mL | INTRAVENOUS | Status: DC | PRN
  Administered 2017-08-31: 10 mL via INTRAVENOUS
  Filled 2017-08-31: qty 10

## 2017-08-31 MED ORDER — GADOBENATE DIMEGLUMINE 529 MG/ML IV SOLN
15.0000 mL | Freq: Once | INTRAVENOUS | Status: AC | PRN
  Administered 2017-08-31: 15 mL via INTRAVENOUS

## 2017-09-02 NOTE — Progress Notes (Signed)
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  INTERVAL HISTORY: Patient returns to clinic today for further evaluation and discussion of her MRI results.  She continues to tolerate Keytruda without significant side effects.  She is restarting XRT shortly for an area of metastatic disease.  She does not complain of headache or other neurologic complaints today. She currently feels well.  She has good appetite and denies weight loss. She denies any pain.  She has no chest pain, shortness of breath, cough, or hemoptysis. She denies any nausea, vomiting, constipation, or diarrhea. She has no urinary complaints.  Patient offers no specific complaints today.  REVIEW OF SYSTEMS:   Review of Systems  Constitutional: Negative.  Negative for fever, malaise/fatigue and weight loss.  Respiratory: Negative for cough, hemoptysis and shortness of breath.   Cardiovascular: Negative.  Negative for chest pain and leg swelling.  Gastrointestinal: Negative.  Negative for abdominal pain and diarrhea.  Genitourinary: Negative.  Negative for flank pain.  Musculoskeletal: Positive for back pain. Negative for falls and joint pain.  Skin: Negative.  Negative for itching and rash.  Neurological: Negative.  Negative for sensory change, focal weakness, weakness and headaches.  Psychiatric/Behavioral: Negative.  The patient is not nervous/anxious and does not have insomnia.     As per HPI. Otherwise, a complete review of systems is negative.  PAST MEDICAL HISTORY: Past Medical History:  Diagnosis Date  . COPD (chronic obstructive pulmonary disease) ([place])   . GU (gastric peptic ulcer)   . Lung cancer ([place])   . Malignant pleural effusion    . PAF (paroxysmal atrial fibrillation) ([place]) 10/2016   a. 10/2016 post thoracentesis ; b. 10/2016 Echo: EF 65-70%, mild LVH;  c. Amio/Eliquis initiated (CHA2DSVASc = 3).  . Pneumonia   . Wrist fracture 1993   bilateral    PAST SURGICAL HISTORY: Past Surgical History:  Procedure Laterality Date  . ABDOMINAL HYSTERECTOMY    . CATARACT EXTRACTION W/ INTRAOCULAR LENS  IMPLANT, BILATERAL    . CHEST TUBE INSERTION Left 12/18/2016   Procedure: CHEST TUBE INSERTION;  Surgeon: [name], MD;  Location: [facility] ORS;  Service: General;  Laterality: Left;  . CHOLECYSTECTOMY    . CLOSED REDUCTION WRIST FRACTURE Left 01/04/2017   Procedure: CLOSED REDUCTION WRIST;  Surgeon: Thornton Park, MD;  Location: [facility] ORS;  Service: Orthopedics;  Laterality: Left;  . PORTACATH PLACEMENT Left 12/18/2016   Procedure: INSERTION PORT-A-CATH;  Surgeon: [name], MD;  Location: [facility] ORS;  Service: General;  Laterality: Left;  . REMOVAL OF PLEURAL DRAINAGE CATHETER N/A 01/02/2017   Procedure: REMOVAL OF PLEURAL DRAINAGE CATHETER;  Surgeon: [name], MD;  Location: [facility] ORS;  Service: Thoracic;  Laterality: N/A;  . TONSILLECTOMY      FAMILY HISTORY: Family History  Problem Relation Age of Onset  . Hypertension Father   . Heart disease Father   . Heart attack Father   . Congestive Heart Failure Mother   . Stroke Mother   . Atrial fibrillation Sister     ADVANCED DIRECTIVES (Y/N):  N  HEALTH MAINTENANCE: Social History   Tobacco Use  . Smoking status: Former Smoker    Packs/day: 1.00    Years: 68.00    Pack years: 68.00  Last attempt to quit: 11/06/2016    Years since quitting: 0.8  . Smokeless tobacco: Never Used  Substance Use Topics  . Alcohol use: No  . Drug use: No     Colonoscopy:  PAP:  Bone density:  Lipid panel:  Allergies  Allergen Reactions  . Sulfa Antibiotics Other (See Comments)    Seizures   . Levaquin [Levofloxacin] Hives and Itching    Current Outpatient  Medications  Medication Sig Dispense Refill  . apixaban (ELIQUIS) 5 MG TABS tablet Take 1 tablet (5 mg total) by mouth 2 (two) times daily. 60 tablet 0  . BIOTIN PO Take by mouth daily.    . cetirizine (ZYRTEC) 10 MG tablet Take 10 mg by mouth daily as needed for allergies.    . cholecalciferol (VITAMIN D) 1000 units tablet Take 1,000 Units by mouth daily.    Marland Kitchen diltiazem (CARDIZEM CD) 120 MG 24 hr capsule Take 1 capsule (120 mg total) by mouth daily.    . diphenoxylate-atropine (LOMOTIL) 2.5-0.025 MG tablet Take 1 tablet 4 (four) times daily as needed by mouth for diarrhea or loose stools. 120 tablet 0  . escitalopram (LEXAPRO) 5 MG tablet Take 5 mg by mouth daily.    . furosemide (LASIX) 20 MG tablet Take 1 tablet by mouth daily as needed. For EDEMA  3  . HYDROcodone-acetaminophen (NORCO) 5-325 MG tablet Take 1 tablet by mouth every 4 (four) hours as needed for moderate pain. 30 tablet 0  . lidocaine-prilocaine (EMLA) cream APPLY TO AFFECTED AREA ONCE  3  . mirtazapine (REMERON) 7.5 MG tablet Take 7.5 mg by mouth at bedtime.    . Multiple Vitamins-Minerals (MULTIVITAMIN ADULT PO) Take by mouth.    Marland Kitchen omeprazole (PRILOSEC) 20 MG capsule Take 1 capsule (20 mg total) by mouth daily. 90 capsule 2  . ondansetron (ZOFRAN) 8 MG tablet TAKE 1 TABLET TWO TIMES DAILY AS NEEDED FOR REFRACTORY NAUSEA OR VOMITING.  120 tablet 0  . potassium chloride SA (K-DUR,KLOR-CON) 20 MEQ tablet Take 20 mEq 2 (two) times daily by mouth.    . prochlorperazine (COMPAZINE) 10 MG tablet Take 1 tablet (10 mg total) by mouth every 6 (six) hours as needed (Nausea or vomiting). 60 tablet 2   No current facility-administered medications for this visit.     OBJECTIVE: Vitals:   09/03/17 1037  BP: 139/70  Pulse: 70  Resp: 18  Temp: (!) 96.6 F (35.9 C)     Body mass index is 28.68 kg/m.    ECOG FS:0 - Asymptomatic  General: Well-developed, well-nourished, no acute distress. Eyes: Pink conjunctiva, anicteric  sclera. Lungs: Clear to auscultation bilaterally. Heart: Regular rate and rhythm. No rubs, murmurs, or gallops. Abdomen: Soft, nontender, nondistended. No organomegaly noted, normoactive bowel sounds. Musculoskeletal: No edema, cyanosis, or clubbing. Neuro: Alert, answering all questions appropriately. Cranial nerves grossly intact. Skin: No rashes or petechiae noted. Psych: Normal affect.  LAB RESULTS:  Lab Results  Component Value Date   NA 141 08/21/2017   K 3.9 08/21/2017   CL 108 08/21/2017   CO2 26 08/21/2017   GLUCOSE 116 (H) 08/21/2017   BUN 17 08/21/2017   CREATININE 0.82 08/21/2017   CALCIUM 8.9 08/21/2017   PROT 6.6 08/21/2017   ALBUMIN 3.2 (L) 08/21/2017   AST 15 08/21/2017   ALT 7 (L) 08/21/2017   ALKPHOS 98 08/21/2017   BILITOT 0.3 08/21/2017   GFRNONAA >60 08/21/2017   GFRAA >60 08/21/2017    Lab Results  Component Value  Date   WBC 7.7 08/21/2017   NEUTROABS 5.3 08/21/2017   HGB 10.2 (L) 08/21/2017   HCT 30.5 (L) 08/21/2017   MCV 76.8 (L) 08/21/2017   PLT 245 08/21/2017     STUDIES: Mr Jeri Cos OY Contrast  Result Date: 08/31/2017 CLINICAL DATA:  Headaches over the last 5 days. History of lung cancer. EXAM: MRI HEAD WITHOUT AND WITH CONTRAST TECHNIQUE: Multiplanar, multiecho pulse sequences of the brain and surrounding structures were obtained without and with intravenous contrast. CONTRAST:  39m MULTIHANCE GADOBENATE DIMEGLUMINE 529 MG/ML IV SOLN COMPARISON:  Head CT 01/02/2017 FINDINGS: Brain: Mild generalized age related atrophy. Incidental venous angioma of the left cerebellum. Minimal small vessel change of the brainstem. Minimal small vessel change of the hemispheric white matter, less than often seen in healthy individuals of this age. No evidence of primary or metastatic mass lesion, hemorrhage, hydrocephalus or extra-axial collection. Vascular: Major vessels at the base of the brain show flow. The left internal carotid artery does not show as much  flow related enhancement is the right, raising the possibility of stenotic disease at the left carotid bifurcation. This is not definite. Skull and upper cervical spine: Negative Sinuses/Orbits: Chronic inflammatory changes of the right maxillary sinus. Other sinuses clear. Orbits negative. Other: None IMPRESSION: No definite cause of headache identified. Normal appearance of the brain for a person of this age with only mild age related volume loss and very minimal small vessel change. Question slow flow in the left internal carotid artery relative to the right. This raises the possibility of left carotid bifurcation stenotic disease. If there is clinical concern, consider carotid ultrasound. Electronically Signed   By: MNelson ChimesM.D.   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  PLAN:    1. 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  She has additional XRT currently scheduled.  Given the multiple problems she previously had with chemotherapy treatment, including declining performance status, this was discontinued and proceeded with second line Keytruda.  Patient will receive this every 3 weeks until intolerable side effects or progression of disease.  Patient received cycle 9 of Keytruda several weeks ago.  Return to clinic as previously scheduled for further evaluation and consideration of cycle 10.  2.  Headaches/neck pain: MRI results reviewed independently and report as above with no obvious metastatic disease. 3. Pain: Patient does not complain of this today.  Continue current narcotic regimen. 4.  Anemia: Patient's hemoglobin is decreased, but stable.  Proceed with treatment as above.  I spent a total of 30 minutes face-to-face with the patient of which greater than 50% of the visit was  spent in counseling and coordination of care as summarized above.  Patient expressed understanding and was in agreement with this plan. She also understands that She can call clinic at any time with any questions, concerns, or complaints.   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  INTERVAL HISTORY: Patient returns to clinic today for further evaluation and consideration of cycle 4 of Keytruda.  She is tolerating her treatments well without significant side effects.  She currently feels well and is asymptomatic.  She has a good appetite and no longer complains of weight loss. She does not complain of weakness or fatigue today. She has no neurologic complaints. She does not complain of rib or arm pain today. She has no chest pain, shortness of breath, cough, or hemoptysis. She denies any nausea, vomiting, constipation, or diarrhea. She has no urinary complaints. Patient offers no specific complaints today.  REVIEW OF SYSTEMS:   Review of Systems  Constitutional: Negative.  Negative for fever, malaise/fatigue and weight loss.  Respiratory: Negative.  Negative for cough and shortness of breath.   Cardiovascular: Negative.  Negative for chest pain and leg swelling.  Gastrointestinal: Negative.  Negative for abdominal pain and diarrhea.  Genitourinary: Negative.   Musculoskeletal: Negative.  Negative for back pain, falls and joint pain.  Skin: Negative.   Negative for itching and rash.  Neurological: Negative.  Negative for sensory change and weakness.  Psychiatric/Behavioral: Negative.  The patient is not nervous/anxious.     As per HPI. Otherwise, a complete review of systems is negative.  PAST MEDICAL HISTORY: Past Medical History:  Diagnosis Date  . COPD (chronic obstructive pulmonary disease) ([place])   . GU (gastric peptic ulcer)   . Lung cancer ([place])   . Malignant pleural effusion   . PAF (paroxysmal atrial fibrillation) ([place]) 10/2016   a. 10/2016 post thoracentesis ; b. 10/2016 Echo: EF 65-70%, mild LVH;  c. Amio/Eliquis initiated (CHA2DSVASc = 3).  . Pneumonia   . Wrist fracture 1993   bilateral    PAST SURGICAL HISTORY: Past Surgical History:  Procedure Laterality Date  . ABDOMINAL HYSTERECTOMY    . CATARACT EXTRACTION W/ INTRAOCULAR LENS  IMPLANT, BILATERAL    . CHEST TUBE INSERTION Left 12/18/2016   Procedure: CHEST TUBE INSERTION;  Surgeon: [name], MD;  Location: [facility] ORS;  Service: General;  Laterality: Left;  . CHOLECYSTECTOMY    . CLOSED REDUCTION WRIST FRACTURE Left 01/04/2017   Procedure: CLOSED REDUCTION WRIST;  Surgeon: Thornton Park, MD;  Location: [facility] ORS;  Service: Orthopedics;  Laterality: Left;  . PORTACATH PLACEMENT Left 12/18/2016   Procedure: INSERTION PORT-A-CATH;  Surgeon: [name], MD;  Location: [facility] ORS;  Service: General;  Laterality: Left;  . REMOVAL OF PLEURAL DRAINAGE CATHETER N/A 01/02/2017   Procedure: REMOVAL OF PLEURAL DRAINAGE CATHETER;  Surgeon: [name], MD;  Location: [facility] ORS;  Service: Thoracic;  Laterality: N/A;  . TONSILLECTOMY      FAMILY HISTORY: Family History  Problem Relation Age of Onset  . Hypertension Father   . Heart disease Father   . Heart attack Father   . Congestive Heart Failure Mother   . Stroke Mother   . Atrial fibrillation Sister     ADVANCED DIRECTIVES (Y/N):  N  HEALTH MAINTENANCE: Social History   Tobacco Use  . Smoking status:  Former Smoker    Packs/day: 1.00    Years: 68.00    Pack years: 68.00    Last attempt to quit: 11/06/2016    Years since quitting: 0.8  . Smokeless tobacco: Never Used  Substance Use Topics  . Alcohol use: No  .  Drug use: No     Colonoscopy:  PAP:  Bone density:  Lipid panel:  Allergies  Allergen Reactions  . Sulfa Antibiotics Other (See Comments)    Seizures   . Levaquin [Levofloxacin] Hives and Itching    Current Outpatient Medications  Medication Sig Dispense Refill  . apixaban (ELIQUIS) 5 MG TABS tablet Take 1 tablet (5 mg total) by mouth 2 (two) times daily. 60 tablet 0  . BIOTIN PO Take by mouth daily.    . cetirizine (ZYRTEC) 10 MG tablet Take 10 mg by mouth daily as needed for allergies.    . cholecalciferol (VITAMIN D) 1000 units tablet Take 1,000 Units by mouth daily.    Marland Kitchen diltiazem (CARDIZEM CD) 120 MG 24 hr capsule Take 1 capsule (120 mg total) by mouth daily.    . diphenoxylate-atropine (LOMOTIL) 2.5-0.025 MG tablet Take 1 tablet 4 (four) times daily as needed by mouth for diarrhea or loose stools. 120 tablet 0  . escitalopram (LEXAPRO) 5 MG tablet Take 5 mg by mouth daily.    . furosemide (LASIX) 20 MG tablet Take 1 tablet by mouth daily as needed. For EDEMA  3  . HYDROcodone-acetaminophen (NORCO) 5-325 MG tablet Take 1 tablet by mouth every 4 (four) hours as needed for moderate pain. 30 tablet 0  . lidocaine-prilocaine (EMLA) cream APPLY TO AFFECTED AREA ONCE  3  . mirtazapine (REMERON) 7.5 MG tablet Take 7.5 mg by mouth at bedtime.    . Multiple Vitamins-Minerals (MULTIVITAMIN ADULT PO) Take by mouth.    Marland Kitchen omeprazole (PRILOSEC) 20 MG capsule Take 1 capsule (20 mg total) by mouth daily. 90 capsule 2  . ondansetron (ZOFRAN) 8 MG tablet TAKE 1 TABLET TWO TIMES DAILY AS NEEDED FOR REFRACTORY NAUSEA OR VOMITING.  120 tablet 0  . potassium chloride SA (K-DUR,KLOR-CON) 20 MEQ tablet Take 20 mEq 2 (two) times daily by mouth.    . prochlorperazine (COMPAZINE) 10 MG  tablet Take 1 tablet (10 mg total) by mouth every 6 (six) hours as needed (Nausea or vomiting). 60 tablet 2   No current facility-administered medications for this visit.     OBJECTIVE: Vitals:   09/03/17 1037  BP: 139/70  Pulse: 70  Resp: 18  Temp: (!) 96.6 F (35.9 C)     Body mass index is 28.68 kg/m.    ECOG FS:1 - Symptomatic but completely ambulatory  General: Well-developed, well-nourished, no acute distress. Eyes: Pink conjunctiva, anicteric sclera. Lungs: Clear to auscultation bilaterally. Heart: Regular rate and rhythm. No rubs, murmurs, or gallops. Abdomen: Soft, nontender, nondistended. No organomegaly noted, normoactive bowel sounds. Musculoskeletal: No edema, cyanosis, or clubbing. Left arm in cast. Neuro: Alert, answering all questions appropriately. Cranial nerves grossly intact. Skin: Maculopapular rash noted, Improved. Psych: Normal affect.    LAB RESULTS:  Lab Results  Component Value Date   NA 141 08/21/2017   K 3.9 08/21/2017   CL 108 08/21/2017   CO2 26 08/21/2017   GLUCOSE 116 (H) 08/21/2017   BUN 17 08/21/2017   CREATININE 0.82 08/21/2017   CALCIUM 8.9 08/21/2017   PROT 6.6 08/21/2017   ALBUMIN 3.2 (L) 08/21/2017   AST 15 08/21/2017   ALT 7 (L) 08/21/2017   ALKPHOS 98 08/21/2017   BILITOT 0.3 08/21/2017   GFRNONAA >60 08/21/2017   GFRAA >60 08/21/2017    Lab Results  Component Value Date   WBC 7.7 08/21/2017   NEUTROABS 5.3 08/21/2017   HGB 10.2 (L) 08/21/2017   HCT 30.5 (L)  08/21/2017   MCV 76.8 (L) 08/21/2017   PLT 245 08/21/2017     STUDIES: Mr Jeri Cos QB Contrast  Result Date: 08/31/2017 CLINICAL DATA:  Headaches over the last 5 days. History of lung cancer. EXAM: MRI HEAD WITHOUT AND WITH CONTRAST TECHNIQUE: Multiplanar, multiecho pulse sequences of the brain and surrounding structures were obtained without and with intravenous contrast. CONTRAST:  34m MULTIHANCE GADOBENATE DIMEGLUMINE 529 MG/ML IV SOLN COMPARISON:  Head CT  01/02/2017 FINDINGS: Brain: Mild generalized age related atrophy. Incidental venous angioma of the left cerebellum. Minimal small vessel change of the brainstem. Minimal small vessel change of the hemispheric white matter, less than often seen in healthy individuals of this age. No evidence of primary or metastatic mass lesion, hemorrhage, hydrocephalus or extra-axial collection. Vascular: Major vessels at the base of the brain show flow. The left internal carotid artery does not show as much flow related enhancement is the right, raising the possibility of stenotic disease at the left carotid bifurcation. This is not definite. Skull and upper cervical spine: Negative Sinuses/Orbits: Chronic inflammatory changes of the right maxillary sinus. Other sinuses clear. Orbits negative. Other: None IMPRESSION: No definite cause of headache identified. Normal appearance of the brain for a person of this age with only mild age related volume loss and very minimal small vessel change. Question slow flow in the left internal carotid artery relative to the right. This raises the possibility of left carotid bifurcation stenotic disease. If there is clinical concern, consider carotid ultrasound. Electronically Signed   By: MNelson ChimesM.D.   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  PLAN:    1. 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  Overall she has improvement of disease burden, but the pleural-based lesion in her left anterior fourth rib appears to be worse.  Will discuss further at cancer conference tomorrow.  Patient refused an MRI of the brain, but did have a head CT on January 02, 2017 that did not report metastatic disease. Treatment was initially delayed secondary to her broken arm.  Given the multiple problems she has had with chemotherapy treatment, including  declining performance status, this was discontinued and proceeded with second line Keytruda.  Patient will receive this every 3 weeks until intolerable side effects or progression of disease. Proceed with cycle 6 of single agent Keytruda.    Patient is going on a trip, therefore she will return to clinic in 4 weeks for consideration of cycle 7.   2. Broken arm: Continue follow-up with orthopedics as scheduled.  Patient reports that she likely does not require surgery. 3. Pain: Patient does not complain of this today.  Continue current narcotic regimen. 4.  Anemia: Mild, monitor.  Approximately 30 minutes was spent in discussion of which greater than 50% was consultation.   Patient expressed understanding and was in agreement with this plan. She also understands that She can call clinic at any time with any questions, concerns, or complaints.   Cancer Staging Squamous cell carcinoma lung, left ([facility] [place] Staging form: Lung, AJCC 8th Edition - Clinical stage from 12/13/2016: Stage IVA (cT4, cN0, cM1a) - Signed by FLloyd Huger MD on 12/13/2016   TLloyd Huger MD   09/07/2017 6:22 PM

## 2017-09-03 ENCOUNTER — Other Ambulatory Visit: Payer: Self-pay

## 2017-09-03 ENCOUNTER — Inpatient Hospital Stay (HOSPITAL_BASED_OUTPATIENT_CLINIC_OR_DEPARTMENT_OTHER): Payer: Medicare HMO | Admitting: Oncology

## 2017-09-03 VITALS — BP 139/70 | HR 70 | Temp 96.6°F | Resp 18 | Wt 161.9 lb

## 2017-09-03 DIAGNOSIS — R51 Headache: Secondary | ICD-10-CM

## 2017-09-03 DIAGNOSIS — R530 Neoplastic (malignant) related fatigue: Secondary | ICD-10-CM | POA: Diagnosis not present

## 2017-09-03 DIAGNOSIS — C3492 Malignant neoplasm of unspecified part of left bronchus or lung: Secondary | ICD-10-CM

## 2017-09-03 DIAGNOSIS — D649 Anemia, unspecified: Secondary | ICD-10-CM | POA: Diagnosis not present

## 2017-09-03 DIAGNOSIS — Z5112 Encounter for antineoplastic immunotherapy: Secondary | ICD-10-CM | POA: Diagnosis not present

## 2017-09-03 NOTE — Progress Notes (Signed)
Here for follow up   Stated overall -having headaches in the last week- x 2   feels good today.  Feeling tired overall but daughters added she tries to do too much . In good spirits

## 2017-09-05 ENCOUNTER — Ambulatory Visit
Admission: RE | Admit: 2017-09-05 | Discharge: 2017-09-05 | Disposition: A | Discharge status: Home / Self Care | Payer: Medicare HMO | Source: Ambulatory Visit | Attending: Radiation Oncology | Admitting: Radiation Oncology

## 2017-09-05 DIAGNOSIS — J910 Malignant pleural effusion: Secondary | ICD-10-CM | POA: Insufficient documentation

## 2017-09-05 DIAGNOSIS — I480 Paroxysmal atrial fibrillation: Secondary | ICD-10-CM | POA: Diagnosis not present

## 2017-09-05 DIAGNOSIS — J449 Chronic obstructive pulmonary disease, unspecified: Secondary | ICD-10-CM | POA: Insufficient documentation

## 2017-09-05 DIAGNOSIS — Z87891 Personal history of nicotine dependence: Secondary | ICD-10-CM | POA: Insufficient documentation

## 2017-09-05 DIAGNOSIS — K259 Gastric ulcer, unspecified as acute or chronic, without hemorrhage or perforation: Secondary | ICD-10-CM | POA: Diagnosis not present

## 2017-09-05 DIAGNOSIS — Z8701 Personal history of pneumonia (recurrent): Secondary | ICD-10-CM | POA: Insufficient documentation

## 2017-09-05 DIAGNOSIS — C3492 Malignant neoplasm of unspecified part of left bronchus or lung: Secondary | ICD-10-CM | POA: Diagnosis not present

## 2017-09-05 DIAGNOSIS — Z510 Encounter for antineoplastic radiation therapy: Secondary | ICD-10-CM | POA: Diagnosis present

## 2017-09-10 DIAGNOSIS — Z510 Encounter for antineoplastic radiation therapy: Secondary | ICD-10-CM | POA: Diagnosis not present

## 2017-09-10 NOTE — Progress Notes (Signed)
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  INTERVAL HISTORY: Patient returns to clinic today for further evaluation and consideration of cycle 10 of Keytruda.  She currently feels well and is asymptomatic.  She reports that she is only having 1 treatment of XRT later this week.  She does not complain of headache or other neurologic complaints today. She has a good appetite and denies weight loss. She denies any pain.  She has no chest pain, shortness of breath, cough, or hemoptysis. She denies any nausea, vomiting, constipation, or diarrhea. She has no urinary complaints.  Patient feels at her baseline offers no specific complaints today.  REVIEW OF SYSTEMS:   Review of Systems  Constitutional: Negative.  Negative for fever, malaise/fatigue and weight loss.  Respiratory: Negative.  Negative for cough, hemoptysis and shortness of breath.   Cardiovascular: Negative.  Negative for chest pain and leg swelling.  Gastrointestinal: Negative.  Negative for abdominal pain and diarrhea.  Genitourinary: Negative.  Negative for flank pain.  Musculoskeletal: Positive for back pain. Negative for falls and joint pain.  Skin: Negative.  Negative for itching and rash.  Neurological: Negative.  Negative for sensory change, focal weakness, weakness and headaches.  Psychiatric/Behavioral: Negative.  The patient is not nervous/anxious and does not have insomnia.     As per HPI. Otherwise, a complete review of systems is negative.  PAST MEDICAL HISTORY: Past Medical History:  Diagnosis Date  . COPD (chronic obstructive pulmonary disease) ([place])   . GU (gastric peptic ulcer)   . Lung cancer ([place])   . Malignant pleural effusion    . PAF (paroxysmal atrial fibrillation) ([place]) 10/2016   a. 10/2016 post thoracentesis ; b. 10/2016 Echo: EF 65-70%, mild LVH;  c. Amio/Eliquis initiated (CHA2DSVASc = 3).  . Pneumonia   . Wrist fracture 1993   bilateral    PAST SURGICAL HISTORY: Past Surgical History:  Procedure Laterality Date  . ABDOMINAL HYSTERECTOMY    . CATARACT EXTRACTION W/ INTRAOCULAR LENS  IMPLANT, BILATERAL    . CHEST TUBE INSERTION Left 12/18/2016   Procedure: CHEST TUBE INSERTION;  Surgeon: [name], MD;  Location: [facility] ORS;  Service: General;  Laterality: Left;  . CHOLECYSTECTOMY    . CLOSED REDUCTION WRIST FRACTURE Left 01/04/2017   Procedure: CLOSED REDUCTION WRIST;  Surgeon: Thornton Park, MD;  Location: [facility] ORS;  Service: Orthopedics;  Laterality: Left;  . PORTACATH PLACEMENT Left 12/18/2016   Procedure: INSERTION PORT-A-CATH;  Surgeon: [name], MD;  Location: [facility] ORS;  Service: General;  Laterality: Left;  . REMOVAL OF PLEURAL DRAINAGE CATHETER N/A 01/02/2017   Procedure: REMOVAL OF PLEURAL DRAINAGE CATHETER;  Surgeon: [name], MD;  Location: [facility] ORS;  Service: Thoracic;  Laterality: N/A;  . TONSILLECTOMY      FAMILY HISTORY: Family History  Problem Relation Age of Onset  . Hypertension Father   . Heart disease Father   . Heart attack Father   . Congestive Heart Failure Mother   . Stroke Mother   . Atrial fibrillation Sister     ADVANCED DIRECTIVES (Y/N):  N  HEALTH MAINTENANCE: Social History   Tobacco Use  . Smoking status: Former Smoker    Packs/day: 1.00    Years: 68.00  Pack years: 68.00    Last attempt to quit: 11/06/2016    Years since quitting: 0.8  . Smokeless tobacco: Never Used  Substance Use Topics  . Alcohol use: No  . Drug use: No     Colonoscopy:  PAP:  Bone density:  Lipid panel:  Allergies  Allergen Reactions  . Sulfa Antibiotics Other (See Comments)    Seizures   . Levaquin [Levofloxacin] Hives and Itching    Current Outpatient  Medications  Medication Sig Dispense Refill  . apixaban (ELIQUIS) 5 MG TABS tablet Take 1 tablet (5 mg total) by mouth 2 (two) times daily. 60 tablet 0  . BIOTIN PO Take by mouth daily.    . cetirizine (ZYRTEC) 10 MG tablet Take 10 mg by mouth daily as needed for allergies.    . cholecalciferol (VITAMIN D) 1000 units tablet Take 1,000 Units by mouth daily.    Marland Kitchen diltiazem (CARDIZEM CD) 120 MG 24 hr capsule Take 1 capsule (120 mg total) by mouth daily.    . diphenoxylate-atropine (LOMOTIL) 2.5-0.025 MG tablet Take 1 tablet 4 (four) times daily as needed by mouth for diarrhea or loose stools. 120 tablet 0  . escitalopram (LEXAPRO) 5 MG tablet Take 5 mg by mouth daily.    . furosemide (LASIX) 20 MG tablet Take 1 tablet by mouth daily as needed. For EDEMA  3  . HYDROcodone-acetaminophen (NORCO) 5-325 MG tablet Take 1 tablet by mouth every 4 (four) hours as needed for moderate pain. 30 tablet 0  . lidocaine-prilocaine (EMLA) cream APPLY TO AFFECTED AREA ONCE  3  . mirtazapine (REMERON) 7.5 MG tablet Take 7.5 mg by mouth at bedtime.    . Multiple Vitamins-Minerals (MULTIVITAMIN ADULT PO) Take by mouth.    Marland Kitchen omeprazole (PRILOSEC) 20 MG capsule Take 1 capsule (20 mg total) by mouth daily. 90 capsule 2  . ondansetron (ZOFRAN) 8 MG tablet TAKE 1 TABLET TWO TIMES DAILY AS NEEDED FOR REFRACTORY NAUSEA OR VOMITING.  120 tablet 0  . potassium chloride SA (K-DUR,KLOR-CON) 20 MEQ tablet Take 20 mEq 2 (two) times daily by mouth.    . prochlorperazine (COMPAZINE) 10 MG tablet Take 1 tablet (10 mg total) by mouth every 6 (six) hours as needed (Nausea or vomiting). (Patient not taking: Reported on 09/11/2017) 60 tablet 2   No current facility-administered medications for this visit.     OBJECTIVE: Vitals:   09/11/17 1025  BP: (!) 161/76  Pulse: 65  Resp: 18  Temp: 97.9 F (36.6 C)     Body mass index is 28.52 kg/m.    ECOG FS:0 - Asymptomatic  General: Well-developed, well-nourished, no acute  distress. Eyes: Pink conjunctiva, anicteric sclera. Lungs: Clear to auscultation bilaterally. Heart: Regular rate and rhythm. No rubs, murmurs, or gallops. Abdomen: Soft, nontender, nondistended. No organomegaly noted, normoactive bowel sounds. Musculoskeletal: No edema, cyanosis, or clubbing. Neuro: Alert, answering all questions appropriately. Cranial nerves grossly intact. Skin: No rashes or petechiae noted. Psych: Normal affect.  LAB RESULTS:  Lab Results  Component Value Date   NA 141 09/11/2017   K 4.1 09/11/2017   CL 109 09/11/2017   CO2 24 09/11/2017   GLUCOSE 118 (H) 09/11/2017   BUN 15 09/11/2017   CREATININE 0.81 09/11/2017   CALCIUM 9.3 09/11/2017   PROT 6.9 09/11/2017   ALBUMIN 3.3 (L) 09/11/2017   AST 15 09/11/2017   ALT 7 09/11/2017   ALKPHOS 103 09/11/2017   BILITOT 0.4 09/11/2017   GFRNONAA >60 09/11/2017  GFRAA >60 09/11/2017    Lab Results  Component Value Date   WBC 7.4 09/11/2017   NEUTROABS 5.4 09/11/2017   HGB 10.3 (L) 09/11/2017   HCT 31.1 (L) 09/11/2017   MCV 77.2 (L) 09/11/2017   PLT 260 09/11/2017     STUDIES: Mr Jeri Cos WN Contrast  Result Date: 08/31/2017 CLINICAL DATA:  Headaches over the last 5 days. History of lung cancer. EXAM: MRI HEAD WITHOUT AND WITH CONTRAST TECHNIQUE: Multiplanar, multiecho pulse sequences of the brain and surrounding structures were obtained without and with intravenous contrast. CONTRAST:  59m MULTIHANCE GADOBENATE DIMEGLUMINE 529 MG/ML IV SOLN COMPARISON:  Head CT 01/02/2017 FINDINGS: Brain: Mild generalized age related atrophy. Incidental venous angioma of the left cerebellum. Minimal small vessel change of the brainstem. Minimal small vessel change of the hemispheric white matter, less than often seen in healthy individuals of this age. No evidence of primary or metastatic mass lesion, hemorrhage, hydrocephalus or extra-axial collection. Vascular: Major vessels at the base of the brain show flow. The left  internal carotid artery does not show as much flow related enhancement is the right, raising the possibility of stenotic disease at the left carotid bifurcation. This is not definite. Skull and upper cervical spine: Negative Sinuses/Orbits: Chronic inflammatory changes of the right maxillary sinus. Other sinuses clear. Orbits negative. Other: None IMPRESSION: No definite cause of headache identified. Normal appearance of the brain for a person of this age with only mild age related volume loss and very minimal small vessel change. Question slow flow in the left internal carotid artery relative to the right. This raises the possibility of left carotid bifurcation stenotic disease. If there is clinical concern, consider carotid ultrasound. Electronically Signed   By: MNelson ChimesM.D.   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  PLAN:    1. 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  She has additional XRT currently scheduled.  Given the multiple problems she previously had with chemotherapy treatment, including declining performance status, this was discontinued and proceeded with second line Keytruda.  Patient will receive this every 3 weeks until intolerable side effects or progression of disease.  Proceed with cycle 10 of Keytruda today.  Return to clinic in 3 weeks for further evaluation and consideration of cycle 11. 2.  Headaches/neck pain: Patient does not complain of this today.  MRI results reviewed independently and report as above with no obvious metastatic disease. 3. Pain: Patient does not complain of this today.  Continue current narcotic regimen. 4.  Anemia: Patient's hemoglobin remains decreased, but unchanged.  No intervention needed at this time.  I spent a total of 30 minutes  face-to-face with the patient of which greater than 50% of the visit was spent in counseling and coordination of care as detailed above.  Patient expressed understanding and was in agreement with this plan. She also understands that She can call clinic at any time with any questions, concerns, or complaints.   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  INTERVAL HISTORY: Patient returns to clinic today for further evaluation and consideration of cycle 4 of Keytruda.  She is tolerating her treatments well without significant side effects.  She currently feels well and is asymptomatic.  She has a good appetite and no longer complains of weight loss. She does not complain of weakness or fatigue today. She has no neurologic complaints. She does not complain of rib or arm pain today. She has no chest pain, shortness of breath, cough, or hemoptysis. She denies any nausea, vomiting, constipation, or diarrhea. She has no urinary complaints. Patient offers no specific complaints today.  REVIEW OF SYSTEMS:   Review of Systems  Constitutional: Negative.  Negative for fever, malaise/fatigue and weight loss.  Respiratory: Negative.  Negative for cough and shortness of breath.   Cardiovascular: Negative.  Negative for chest pain and leg swelling.  Gastrointestinal: Negative.  Negative for abdominal pain and diarrhea.  Genitourinary: Negative.   Musculoskeletal: Negative.   Negative for back pain, falls and joint pain.  Skin: Negative.  Negative for itching and rash.  Neurological: Negative.  Negative for sensory change and weakness.  Psychiatric/Behavioral: Negative.  The patient is not nervous/anxious.     As per HPI. Otherwise, a complete review of systems is negative.  PAST MEDICAL HISTORY: Past Medical History:  Diagnosis Date  . COPD (chronic obstructive pulmonary disease) ([place])   . GU (gastric peptic ulcer)   . Lung cancer ([place])   . Malignant pleural effusion   . PAF (paroxysmal atrial fibrillation) ([place]) 10/2016   a. 10/2016 post thoracentesis ; b. 10/2016 Echo: EF 65-70%, mild LVH;  c. Amio/Eliquis initiated (CHA2DSVASc = 3).  . Pneumonia   . Wrist fracture 1993   bilateral    PAST SURGICAL HISTORY: Past Surgical History:  Procedure Laterality Date  . ABDOMINAL HYSTERECTOMY    . CATARACT EXTRACTION W/ INTRAOCULAR LENS  IMPLANT, BILATERAL    . CHEST TUBE INSERTION Left 12/18/2016   Procedure: CHEST TUBE INSERTION;  Surgeon: [name], MD;  Location: [facility] ORS;  Service: General;  Laterality: Left;  . CHOLECYSTECTOMY    . CLOSED REDUCTION WRIST FRACTURE Left 01/04/2017   Procedure: CLOSED REDUCTION WRIST;  Surgeon: Thornton Park, MD;  Location: [facility] ORS;  Service: Orthopedics;  Laterality: Left;  . PORTACATH PLACEMENT Left 12/18/2016   Procedure: INSERTION PORT-A-CATH;  Surgeon: [name], MD;  Location: [facility] ORS;  Service: General;  Laterality: Left;  . REMOVAL OF PLEURAL DRAINAGE CATHETER N/A 01/02/2017   Procedure: REMOVAL OF PLEURAL DRAINAGE CATHETER;  Surgeon: [name], MD;  Location: [facility] ORS;  Service: Thoracic;  Laterality: N/A;  . TONSILLECTOMY      FAMILY HISTORY: Family History  Problem Relation Age of Onset  . Hypertension Father   . Heart disease Father   . Heart attack Father   . Congestive Heart Failure Mother   . Stroke Mother   . Atrial fibrillation Sister     ADVANCED DIRECTIVES (Y/N):  N  HEALTH  MAINTENANCE: Social History   Tobacco Use  . Smoking status: Former Smoker    Packs/day: 1.00    Years: 68.00    Pack years: 68.00    Last attempt to quit: 11/06/2016    Years since quitting:  0.8  . Smokeless tobacco: Never Used  Substance Use Topics  . Alcohol use: No  . Drug use: No     Colonoscopy:  PAP:  Bone density:  Lipid panel:  Allergies  Allergen Reactions  . Sulfa Antibiotics Other (See Comments)    Seizures   . Levaquin [Levofloxacin] Hives and Itching    Current Outpatient Medications  Medication Sig Dispense Refill  . apixaban (ELIQUIS) 5 MG TABS tablet Take 1 tablet (5 mg total) by mouth 2 (two) times daily. 60 tablet 0  . BIOTIN PO Take by mouth daily.    . cetirizine (ZYRTEC) 10 MG tablet Take 10 mg by mouth daily as needed for allergies.    . cholecalciferol (VITAMIN D) 1000 units tablet Take 1,000 Units by mouth daily.    Marland Kitchen diltiazem (CARDIZEM CD) 120 MG 24 hr capsule Take 1 capsule (120 mg total) by mouth daily.    . diphenoxylate-atropine (LOMOTIL) 2.5-0.025 MG tablet Take 1 tablet 4 (four) times daily as needed by mouth for diarrhea or loose stools. 120 tablet 0  . escitalopram (LEXAPRO) 5 MG tablet Take 5 mg by mouth daily.    . furosemide (LASIX) 20 MG tablet Take 1 tablet by mouth daily as needed. For EDEMA  3  . HYDROcodone-acetaminophen (NORCO) 5-325 MG tablet Take 1 tablet by mouth every 4 (four) hours as needed for moderate pain. 30 tablet 0  . lidocaine-prilocaine (EMLA) cream APPLY TO AFFECTED AREA ONCE  3  . mirtazapine (REMERON) 7.5 MG tablet Take 7.5 mg by mouth at bedtime.    . Multiple Vitamins-Minerals (MULTIVITAMIN ADULT PO) Take by mouth.    Marland Kitchen omeprazole (PRILOSEC) 20 MG capsule Take 1 capsule (20 mg total) by mouth daily. 90 capsule 2  . ondansetron (ZOFRAN) 8 MG tablet TAKE 1 TABLET TWO TIMES DAILY AS NEEDED FOR REFRACTORY NAUSEA OR VOMITING.  120 tablet 0  . potassium chloride SA (K-DUR,KLOR-CON) 20 MEQ tablet Take 20 mEq 2 (two)  times daily by mouth.    . prochlorperazine (COMPAZINE) 10 MG tablet Take 1 tablet (10 mg total) by mouth every 6 (six) hours as needed (Nausea or vomiting). (Patient not taking: Reported on 09/11/2017) 60 tablet 2   No current facility-administered medications for this visit.     OBJECTIVE: Vitals:   09/11/17 1025  BP: (!) 161/76  Pulse: 65  Resp: 18  Temp: 97.9 F (36.6 C)     Body mass index is 28.52 kg/m.    ECOG FS:1 - Symptomatic but completely ambulatory  General: Well-developed, well-nourished, no acute distress. Eyes: Pink conjunctiva, anicteric sclera. Lungs: Clear to auscultation bilaterally. Heart: Regular rate and rhythm. No rubs, murmurs, or gallops. Abdomen: Soft, nontender, nondistended. No organomegaly noted, normoactive bowel sounds. Musculoskeletal: No edema, cyanosis, or clubbing. Left arm in cast. Neuro: Alert, answering all questions appropriately. Cranial nerves grossly intact. Skin: Maculopapular rash noted, Improved. Psych: Normal affect.    LAB RESULTS:  Lab Results  Component Value Date   NA 141 09/11/2017   K 4.1 09/11/2017   CL 109 09/11/2017   CO2 24 09/11/2017   GLUCOSE 118 (H) 09/11/2017   BUN 15 09/11/2017   CREATININE 0.81 09/11/2017   CALCIUM 9.3 09/11/2017   PROT 6.9 09/11/2017   ALBUMIN 3.3 (L) 09/11/2017   AST 15 09/11/2017   ALT 7 09/11/2017   ALKPHOS 103 09/11/2017   BILITOT 0.4 09/11/2017   GFRNONAA >60 09/11/2017   GFRAA >60 09/11/2017    Lab Results  Component  Value Date   WBC 7.4 09/11/2017   NEUTROABS 5.4 09/11/2017   HGB 10.3 (L) 09/11/2017   HCT 31.1 (L) 09/11/2017   MCV 77.2 (L) 09/11/2017   PLT 260 09/11/2017     STUDIES: Mr Jeri Cos KK Contrast  Result Date: 08/31/2017 CLINICAL DATA:  Headaches over the last 5 days. History of lung cancer. EXAM: MRI HEAD WITHOUT AND WITH CONTRAST TECHNIQUE: Multiplanar, multiecho pulse sequences of the brain and surrounding structures were obtained without and with  intravenous contrast. CONTRAST:  6m MULTIHANCE GADOBENATE DIMEGLUMINE 529 MG/ML IV SOLN COMPARISON:  Head CT 01/02/2017 FINDINGS: Brain: Mild generalized age related atrophy. Incidental venous angioma of the left cerebellum. Minimal small vessel change of the brainstem. Minimal small vessel change of the hemispheric white matter, less than often seen in healthy individuals of this age. No evidence of primary or metastatic mass lesion, hemorrhage, hydrocephalus or extra-axial collection. Vascular: Major vessels at the base of the brain show flow. The left internal carotid artery does not show as much flow related enhancement is the right, raising the possibility of stenotic disease at the left carotid bifurcation. This is not definite. Skull and upper cervical spine: Negative Sinuses/Orbits: Chronic inflammatory changes of the right maxillary sinus. Other sinuses clear. Orbits negative. Other: None IMPRESSION: No definite cause of headache identified. Normal appearance of the brain for a person of this age with only mild age related volume loss and very minimal small vessel change. Question slow flow in the left internal carotid artery relative to the right. This raises the possibility of left carotid bifurcation stenotic disease. If there is clinical concern, consider carotid ultrasound. Electronically Signed   By: MNelson ChimesM.D.   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  PLAN:    1. 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  Overall she has improvement of disease burden, but the pleural-based lesion in her left anterior fourth rib appears to be worse.  Will discuss further at cancer conference tomorrow.  Patient refused an MRI of the brain, but did have a head CT on January 02, 2017 that did not report metastatic disease. Treatment was initially delayed  secondary to her broken arm.  Given the multiple problems she has had with chemotherapy treatment, including declining performance status, this was discontinued and proceeded with second line Keytruda.  Patient will receive this every 3 weeks until intolerable side effects or progression of disease. Proceed with cycle 6 of single agent Keytruda.    Patient is going on a trip, therefore she will return to clinic in 4 weeks for consideration of cycle 7.   2. Broken arm: Continue follow-up with orthopedics as scheduled.  Patient reports that she likely does not require surgery. 3. Pain: Patient does not complain of this today.  Continue current narcotic regimen. 4.  Anemia: Mild, monitor.  Approximately 30 minutes was spent in discussion of which greater than 50% was consultation.   Patient expressed understanding and was in agreement with this plan. She also understands that She can call clinic at any time with any questions, concerns, or complaints.   Cancer Staging Squamous cell carcinoma lung, left ([facility] Staging form: Lung, AJCC 8th Edition - Clinical stage from 12/13/2016: Stage IVA (cT4, cN0, cM1a) - Signed by FLloyd Huger MD on 12/13/2016   TLloyd Huger MD  09/11/2017 11:05 AM

## 2017-09-11 ENCOUNTER — Inpatient Hospital Stay: Payer: Medicare HMO

## 2017-09-11 ENCOUNTER — Inpatient Hospital Stay (HOSPITAL_BASED_OUTPATIENT_CLINIC_OR_DEPARTMENT_OTHER): Payer: Medicare HMO | Admitting: Oncology

## 2017-09-11 VITALS — BP 161/76 | HR 65 | Temp 97.9°F | Resp 18 | Wt 161.0 lb

## 2017-09-11 DIAGNOSIS — C3492 Malignant neoplasm of unspecified part of left bronchus or lung: Secondary | ICD-10-CM | POA: Diagnosis not present

## 2017-09-11 DIAGNOSIS — D649 Anemia, unspecified: Secondary | ICD-10-CM

## 2017-09-11 DIAGNOSIS — Z5112 Encounter for antineoplastic immunotherapy: Secondary | ICD-10-CM | POA: Diagnosis not present

## 2017-09-11 LAB — CBC WITH DIFFERENTIAL/PLATELET
Basophils Absolute: 0 10*3/uL (ref 0–0.1)
Basophils Relative: 1 %
EOS PCT: 6 %
Eosinophils Absolute: 0.4 10*3/uL (ref 0–0.7)
HCT: 31.1 % — ABNORMAL LOW (ref 35.0–47.0)
Hemoglobin: 10.3 g/dL — ABNORMAL LOW (ref 12.0–16.0)
LYMPHS ABS: 1 10*3/uL (ref 1.0–3.6)
LYMPHS PCT: 13 %
MCH: 25.6 pg — AB (ref 26.0–34.0)
MCHC: 33.1 g/dL (ref 32.0–36.0)
MCV: 77.2 fL — ABNORMAL LOW (ref 80.0–100.0)
MONO ABS: 0.7 10*3/uL (ref 0.2–0.9)
Monocytes Relative: 9 %
Neutro Abs: 5.4 10*3/uL (ref 1.4–6.5)
Neutrophils Relative %: 71 %
PLATELETS: 260 10*3/uL (ref 150–440)
RBC: 4.03 MIL/uL (ref 3.80–5.20)
RDW: 15.6 % — AB (ref 11.5–14.5)
WBC: 7.4 10*3/uL (ref 3.6–11.0)

## 2017-09-11 LAB — COMPREHENSIVE METABOLIC PANEL
ALBUMIN: 3.3 g/dL — AB (ref 3.5–5.0)
ALT: 7 U/L (ref 0–44)
ANION GAP: 8 (ref 5–15)
AST: 15 U/L (ref 15–41)
Alkaline Phosphatase: 103 U/L (ref 38–126)
BILIRUBIN TOTAL: 0.4 mg/dL (ref 0.3–1.2)
BUN: 15 mg/dL (ref 8–23)
CO2: 24 mmol/L (ref 22–32)
Calcium: 9.3 mg/dL (ref 8.9–10.3)
Chloride: 109 mmol/L (ref 98–111)
Creatinine, Ser: 0.81 mg/dL (ref 0.44–1.00)
GFR calc Af Amer: >60 mL/min (ref >60)
GFR calc non Af Amer: >60 mL/min (ref >60)
GLUCOSE: 118 mg/dL — AB (ref 70–99)
POTASSIUM: 4.1 mmol/L (ref 3.5–5.1)
SODIUM: 141 mmol/L (ref 135–145)
TOTAL PROTEIN: 6.9 g/dL (ref 6.5–8.1)

## 2017-09-11 MED ORDER — SODIUM CHLORIDE 0.9 % IV SOLN
Freq: Once | INTRAVENOUS | Status: AC
  Administered 2017-09-11: 12:00:00 via INTRAVENOUS
  Filled 2017-09-11: qty 1000

## 2017-09-11 MED ORDER — SODIUM CHLORIDE 0.9 % IV SOLN
200.0000 mg | Freq: Once | INTRAVENOUS | Status: AC
  Administered 2017-09-11: 200 mg via INTRAVENOUS
  Filled 2017-09-11: qty 8

## 2017-09-11 MED ORDER — HEPARIN SOD (PORK) LOCK FLUSH 100 UNIT/ML IV SOLN
500.0000 [IU] | Freq: Once | INTRAVENOUS | Status: AC | PRN
  Administered 2017-09-11: 500 [IU]
  Filled 2017-09-11: qty 5

## 2017-09-11 NOTE — Progress Notes (Signed)
Pt and daughter in for follow up.  Pt denies any concerns or difficulties today.

## 2017-09-12 ENCOUNTER — Ambulatory Visit
Admission: RE | Admit: 2017-09-12 | Discharge: 2017-09-12 | Disposition: A | Discharge status: Home / Self Care | Payer: Medicare HMO | Source: Ambulatory Visit | Attending: Radiation Oncology | Admitting: Radiation Oncology

## 2017-09-12 DIAGNOSIS — Z510 Encounter for antineoplastic radiation therapy: Secondary | ICD-10-CM | POA: Diagnosis not present

## 2017-09-12 LAB — THYROID PANEL WITH TSH
FREE THYROXINE INDEX: 2.1 (ref 1.2–4.9)
T3 UPTAKE RATIO: 26 % (ref 24–39)
T4, Total: 8.2 ug/dL (ref 4.5–12.0)
TSH: 2.2 u[IU]/mL (ref 0.450–4.500)

## 2017-09-13 ENCOUNTER — Encounter: Payer: Self-pay | Admitting: Internal Medicine

## 2017-09-13 ENCOUNTER — Ambulatory Visit: Payer: Medicare HMO

## 2017-09-14 ENCOUNTER — Ambulatory Visit: Payer: Medicare HMO

## 2017-09-14 ENCOUNTER — Other Ambulatory Visit: Payer: Self-pay | Admitting: *Deleted

## 2017-09-14 MED ORDER — HYDROCODONE-ACETAMINOPHEN 5-325 MG PO TABS: 1.0000 | ORAL_TABLET | ORAL | 0 refills | Status: DC | PRN

## 2017-09-17 ENCOUNTER — Ambulatory Visit: Payer: Medicare HMO

## 2017-09-18 ENCOUNTER — Ambulatory Visit: Payer: Medicare HMO

## 2017-09-19 ENCOUNTER — Ambulatory Visit: Payer: Medicare HMO

## 2017-09-21 ENCOUNTER — Ambulatory Visit: Payer: Medicare HMO

## 2017-09-24 ENCOUNTER — Ambulatory Visit: Payer: Medicare HMO

## 2017-09-25 ENCOUNTER — Ambulatory Visit: Payer: Medicare HMO

## 2017-09-25 ENCOUNTER — Encounter: Payer: Self-pay | Admitting: *Deleted

## 2017-09-26 ENCOUNTER — Ambulatory Visit: Payer: Medicare HMO

## 2017-09-27 ENCOUNTER — Other Ambulatory Visit: Payer: Self-pay

## 2017-09-27 ENCOUNTER — Inpatient Hospital Stay: Payer: Medicare HMO | Admitting: Nurse Practitioner

## 2017-09-27 ENCOUNTER — Emergency Department: Payer: Medicare HMO

## 2017-09-27 ENCOUNTER — Ambulatory Visit: Payer: Medicare HMO

## 2017-09-27 ENCOUNTER — Encounter: Payer: Self-pay | Admitting: Emergency Medicine

## 2017-09-27 ENCOUNTER — Emergency Department
Admission: EM | Admit: 2017-09-27 | Discharge: 2017-09-27 | Disposition: A | Discharge status: Home / Self Care | Payer: Medicare HMO | Attending: Emergency Medicine | Admitting: Emergency Medicine

## 2017-09-27 ENCOUNTER — Telehealth: Payer: Self-pay | Admitting: *Deleted

## 2017-09-27 ENCOUNTER — Inpatient Hospital Stay: Payer: Medicare HMO

## 2017-09-27 DIAGNOSIS — R0789 Other chest pain: Secondary | ICD-10-CM | POA: Insufficient documentation

## 2017-09-27 DIAGNOSIS — R0602 Shortness of breath: Secondary | ICD-10-CM | POA: Insufficient documentation

## 2017-09-27 DIAGNOSIS — R079 Chest pain, unspecified: Secondary | ICD-10-CM

## 2017-09-27 DIAGNOSIS — J449 Chronic obstructive pulmonary disease, unspecified: Secondary | ICD-10-CM | POA: Insufficient documentation

## 2017-09-27 DIAGNOSIS — Z79899 Other long term (current) drug therapy: Secondary | ICD-10-CM | POA: Insufficient documentation

## 2017-09-27 DIAGNOSIS — Z7901 Long term (current) use of anticoagulants: Secondary | ICD-10-CM | POA: Insufficient documentation

## 2017-09-27 DIAGNOSIS — Z87891 Personal history of nicotine dependence: Secondary | ICD-10-CM | POA: Insufficient documentation

## 2017-09-27 LAB — LIPASE, BLOOD: Lipase: 23 U/L (ref 11–51)

## 2017-09-27 LAB — CBC WITH DIFFERENTIAL/PLATELET
BASOS PCT: 0 %
Basophils Absolute: 0 10*3/uL (ref 0–0.1)
Eosinophils Absolute: 0.1 10*3/uL (ref 0–0.7)
Eosinophils Relative: 1 %
HEMATOCRIT: 29.5 % — AB (ref 35.0–47.0)
HEMOGLOBIN: 9.7 g/dL — AB (ref 12.0–16.0)
LYMPHS PCT: 10 %
Lymphs Abs: 1.1 10*3/uL (ref 1.0–3.6)
MCH: 25.2 pg — ABNORMAL LOW (ref 26.0–34.0)
MCHC: 32.7 g/dL (ref 32.0–36.0)
MCV: 77 fL — AB (ref 80.0–100.0)
MONOS PCT: 12 %
Monocytes Absolute: 1.4 10*3/uL — ABNORMAL HIGH (ref 0.2–0.9)
NEUTROS ABS: 8.8 10*3/uL — AB (ref 1.4–6.5)
NEUTROS PCT: 77 %
Platelets: 257 10*3/uL (ref 150–440)
RBC: 3.83 MIL/uL (ref 3.80–5.20)
RDW: 15.9 % — ABNORMAL HIGH (ref 11.5–14.5)
WBC: 11.3 10*3/uL — ABNORMAL HIGH (ref 3.6–11.0)

## 2017-09-27 LAB — COMPREHENSIVE METABOLIC PANEL
ALK PHOS: 97 U/L (ref 38–126)
ALT: 9 U/L (ref 0–44)
ANION GAP: 9 (ref 5–15)
AST: 15 U/L (ref 15–41)
Albumin: 3 g/dL — ABNORMAL LOW (ref 3.5–5.0)
BILIRUBIN TOTAL: 0.9 mg/dL (ref 0.3–1.2)
BUN: 15 mg/dL (ref 8–23)
CALCIUM: 8.3 mg/dL — AB (ref 8.9–10.3)
CO2: 25 mmol/L (ref 22–32)
CREATININE: 0.85 mg/dL (ref 0.44–1.00)
Chloride: 102 mmol/L (ref 98–111)
Glucose, Bld: 119 mg/dL — ABNORMAL HIGH (ref 70–99)
Potassium: 3.9 mmol/L (ref 3.5–5.1)
Sodium: 136 mmol/L (ref 135–145)
TOTAL PROTEIN: 6.4 g/dL — AB (ref 6.5–8.1)

## 2017-09-27 LAB — HEPATIC FUNCTION PANEL
ALK PHOS: 98 U/L (ref 38–126)
ALT: 9 U/L (ref 0–44)
AST: 16 U/L (ref 15–41)
Albumin: 3 g/dL — ABNORMAL LOW (ref 3.5–5.0)
BILIRUBIN DIRECT: 0.1 mg/dL (ref 0.0–0.2)
BILIRUBIN INDIRECT: 0.7 mg/dL (ref 0.3–0.9)
BILIRUBIN TOTAL: 0.8 mg/dL (ref 0.3–1.2)
Total Protein: 6.3 g/dL — ABNORMAL LOW (ref 6.5–8.1)

## 2017-09-27 LAB — LACTIC ACID, PLASMA: Lactic Acid, Venous: 0.8 mmol/L (ref 0.5–1.9)

## 2017-09-27 LAB — TROPONIN I
Troponin I: <0.03 ng/mL (ref <0.03)
Troponin I: <0.03 ng/mL (ref <0.03)

## 2017-09-27 MED ORDER — SODIUM CHLORIDE 0.9% FLUSH
10.0000 mL | Freq: Once | INTRAVENOUS | Status: AC
  Filled 2017-09-27: qty 10

## 2017-09-27 MED ORDER — HEPARIN SOD (PORK) LOCK FLUSH 100 UNIT/ML IV SOLN
500.0000 [IU] | Freq: Once | INTRAVENOUS | Status: AC
  Administered 2017-09-27: 500 [IU] via INTRAVENOUS

## 2017-09-27 MED ORDER — HEPARIN SOD (PORK) LOCK FLUSH 100 UNIT/ML IV SOLN: 500.0000 [IU] | Freq: Once | INTRAVENOUS | Status: AC

## 2017-09-27 MED ORDER — SODIUM CHLORIDE 0.9 % IV BOLUS
500.0000 mL | Freq: Once | INTRAVENOUS | Status: AC
  Administered 2017-09-27: 500 mL via INTRAVENOUS

## 2017-09-27 MED ORDER — HEPARIN SOD (PORK) LOCK FLUSH 100 UNIT/ML IV SOLN
INTRAVENOUS | Status: AC
  Filled 2017-09-27: qty 5

## 2017-09-27 MED ORDER — IOHEXOL 350 MG/ML SOLN
75.0000 mL | Freq: Once | INTRAVENOUS | Status: AC | PRN
  Administered 2017-09-27: 75 mL via INTRAVENOUS

## 2017-09-27 NOTE — ED Provider Notes (Signed)
[facility] Emergency Department Provider Note  ____________________________________________   First MD Initiated Contact with Patient 09/27/17 1105     (approximate)  I have reviewed the triage vital signs and the nursing notes.   HISTORY  Chief Complaint Shortness of Breath    HPI [name] is a 84 y.o. female with past medical history as listed below which notably includes stage IV lung cancer for which she is receiving immunotherapy by Dr. Grayland Ormond.  She presents for evaluation of shortness of breath and painful breathing.  She reports that last night she was unable to get comfortable all night long because of the difficulty breathing and pain when she took deep breaths.  The pain is a little bit better and the shortness of breath is a little bit better today but still not completely resolved.  She says that the shortness of breath and the pain in particular are new symptoms, acute in onset last night.  She has not had pain at any point during her cancer diagnosis and treatment thus far.  She does have a history of atrial fibrillation and is on Eliquis.  She has chronic back pain and frequent headaches and these symptoms are not new.  She has had some nausea but no vomiting.  She denies abdominal pain.  She denies fever/chills, but her family makes a point of noting that her temperature is usually lower than normal so a temperature of 98.9 degrees is concerning to them.  Nothing particular makes her symptoms better or worse.  She is conversing easily, laughing and joking with me, and is currently in no acute distress.  Past Medical History:  Diagnosis Date  . COPD (chronic obstructive pulmonary disease) ([place])   . GU (gastric peptic ulcer)   . Lung cancer ([place])   . Malignant pleural effusion   . PAF (paroxysmal atrial fibrillation) ([place]) 10/2016   a. 10/2016 post thoracentesis ; b. 10/2016 Echo: EF 65-70%, mild LVH;  c. Amio/Eliquis initiated (CHA2DSVASc =  3).  . Pneumonia   . Wrist fracture 1993   bilateral    Patient Active Problem List   Diagnosis Date Noted  . A-fib ([place]) 01/05/2017  . Chest pain 01/01/2017  . Cellulitis of trunk   . Lung cancer ([place]) 12/18/2016  . Goals of care, counseling/discussion 12/17/2016  . Squamous cell carcinoma lung, left ([place]) 12/12/2016  . Atrial fibrillation ([place])   . SOB (shortness of breath) 11/12/2016  . Non-pressure chronic ulcer of right thigh with fat layer exposed ([place]) 03/16/2015    Past Surgical History:  Procedure Laterality Date  . ABDOMINAL HYSTERECTOMY    . CATARACT EXTRACTION W/ INTRAOCULAR LENS  IMPLANT, BILATERAL    . CHEST TUBE INSERTION Left 12/18/2016   Procedure: CHEST TUBE INSERTION;  Surgeon: [name], MD;  Location: [facility] ORS;  Service: General;  Laterality: Left;  . CHOLECYSTECTOMY    . CLOSED REDUCTION WRIST FRACTURE Left 01/04/2017   Procedure: CLOSED REDUCTION WRIST;  Surgeon: Thornton Park, MD;  Location: [facility] ORS;  Service: Orthopedics;  Laterality: Left;  . PORTACATH PLACEMENT Left 12/18/2016   Procedure: INSERTION PORT-A-CATH;  Surgeon: [name], MD;  Location: [facility] ORS;  Service: General;  Laterality: Left;  . REMOVAL OF PLEURAL DRAINAGE CATHETER N/A 01/02/2017   Procedure: REMOVAL OF PLEURAL DRAINAGE CATHETER;  Surgeon: [name], MD;  Location: [facility] ORS;  Service: Thoracic;  Laterality: N/A;  . TONSILLECTOMY      Prior to Admission medications   Medication Sig Start  Date End Date Taking? Authorizing Provider  apixaban (ELIQUIS) 5 MG TABS tablet Take 1 tablet (5 mg total) by mouth 2 (two) times daily. 11/16/16   [name], MD  BIOTIN PO Take by mouth daily.    [provider]  cetirizine (ZYRTEC) 10 MG tablet Take 10 mg by mouth daily as needed for allergies.    [provider]  cholecalciferol (VITAMIN D) 1000 units tablet Take 1,000 Units by mouth daily.    [provider]  diltiazem (CARDIZEM CD) 120 MG 24  hr capsule Take 1 capsule (120 mg total) by mouth daily. 01/09/17   Dustin Flock, MD  diphenoxylate-atropine (LOMOTIL) 2.5-0.025 MG tablet Take 1 tablet 4 (four) times daily as needed by mouth for diarrhea or loose stools. 02/05/17   Verlon Au, NP  escitalopram (LEXAPRO) 5 MG tablet Take 5 mg by mouth daily.    [provider]  furosemide (LASIX) 20 MG tablet Take 1 tablet by mouth daily as needed. For EDEMA 10/07/16   [provider]  HYDROcodone-acetaminophen (NORCO) 5-325 MG tablet Take 1 tablet by mouth every 4 (four) hours as needed for moderate pain. 09/14/17   Jacquelin Hawking, NP  lidocaine-prilocaine (EMLA) cream APPLY TO AFFECTED AREA ONCE 06/06/17   [provider]  mirtazapine (REMERON) 7.5 MG tablet Take 7.5 mg by mouth at bedtime.    [provider]  Multiple Vitamins-Minerals (MULTIVITAMIN ADULT PO) Take by mouth.    [provider]  omeprazole (PRILOSEC) 20 MG capsule Take 1 capsule (20 mg total) by mouth daily. 02/13/17   [name], MD  ondansetron (ZOFRAN) 8 MG tablet TAKE 1 TABLET TWO TIMES DAILY AS NEEDED FOR REFRACTORY NAUSEA OR VOMITING.  04/03/17   [name], MD  potassium chloride SA (K-DUR,KLOR-CON) 20 MEQ tablet Take 20 mEq 2 (two) times daily by mouth.    [provider]  prochlorperazine (COMPAZINE) 10 MG tablet Take 1 tablet (10 mg total) by mouth every 6 (six) hours as needed (Nausea or vomiting). Patient not taking: Reported on 09/11/2017 03/28/17   [name], MD    Allergies Sulfa antibiotics and Levaquin [levofloxacin]  Family History  Problem Relation Age of Onset  . Hypertension Father   . Heart disease Father   . Heart attack Father   . Congestive Heart Failure Mother   . Stroke Mother   . Atrial fibrillation Sister     Social History Social History   Tobacco Use  . Smoking status: Former Smoker    Packs/day: 1.00    Years: 68.00    Pack years: 68.00    Last  attempt to quit: 11/06/2016    Years since quitting: 0.8  . Smokeless tobacco: Never Used  Substance Use Topics  . Alcohol use: No  . Drug use: No    Review of Systems Constitutional: No fever/chills Eyes: No visual changes. ENT: Initially she thought she had a sore throat last night, but she reports that is actually that it hurts up in the top of her chest and she thought it was her throat when she takes deep breaths.  No sore throat at this point, no odynophagia or dysphasia. Cardiovascular: Denies chest pain. Respiratory: Shortness of breath and some pain with inspiration Gastrointestinal: No abdominal pain.  nausea, no vomiting.  No diarrhea.  No constipation. Genitourinary: Negative for dysuria. Musculoskeletal: Negative for neck pain.  Negative for back pain. Integumentary: Negative for rash. Neurological: Negative for headaches, focal weakness or  numbness.   ____________________________________________   PHYSICAL EXAM:  VITAL SIGNS: ED Triage Vitals  Enc Vitals Group     BP 09/27/17 1037 105/64     Pulse Rate 09/27/17 1037 (!) 113     Resp 09/27/17 1037 (!) 22     Temp 09/27/17 1037 98.9 F (37.2 C)     Temp Source 09/27/17 1037 Oral     SpO2 09/27/17 1037 96 %     Weight 09/27/17 1038 72.6 kg (160 lb)     Height 09/27/17 1038 1.575 m (5\' 2" )     Head Circumference --      Peak Flow --      Pain Score 09/27/17 1038 5     Pain Loc --      Pain Edu? --      Excl. in [place]? --     Constitutional: Alert and oriented. Well appearing and in no acute distress. Eyes: Conjunctivae are normal.  Head: Atraumatic. Nose: No congestion/rhinnorhea. Mouth/Throat: Mucous membranes are moist. Neck: No stridor.  No meningeal signs.   Cardiovascular: Normal rate, regular rhythm. Good peripheral circulation. Grossly normal heart sounds.  Port in left side of chest. Respiratory: Normal respiratory effort.  No retractions.  Diminished lung sounds throughout, possibly secondary to  decreased effort. Gastrointestinal: Soft and nontender. No distention.  Musculoskeletal: No lower extremity tenderness nor edema. No gross deformities of extremities. Neurologic:  Normal speech and language. No gross focal neurologic deficits are appreciated.  Skin:  Skin is warm, dry and intact. No rash noted. Psychiatric: Mood and affect are normal. Speech and behavior are normal.  ____________________________________________   LABS (all labs ordered are listed, but only abnormal results are displayed)  Labs Reviewed  HEPATIC FUNCTION PANEL - Abnormal; Notable for the following components:      Result Value   Total Protein 6.3 (*)    Albumin 3.0 (*)    All other components within normal limits  COMPREHENSIVE METABOLIC PANEL - Abnormal; Notable for the following components:   Glucose, Bld 119 (*)    Calcium 8.3 (*)    Total Protein 6.4 (*)    Albumin 3.0 (*)    All other components within normal limits  LIPASE, BLOOD  LACTIC ACID, PLASMA  TROPONIN I  CBC WITH DIFFERENTIAL/PLATELET  LACTIC ACID, PLASMA   ____________________________________________  EKG  ED ECG REPORT I, Hinda Kehr, the attending physician, personally viewed and interpreted this ECG.  Date: 09/27/2017 EKG Time: 10:31 Rate: 122 Rhythm: a-fib w/ RVR QRS Axis: normal Intervals: abnormal secondary to a-fib, QTC WNL ST/T Wave abnormalities: Non-specific ST segment / T-wave changes, but no evidence of acute ischemia. Narrative Interpretation: no evidence of acute ischemia. A-fib w/ RVR was also present on prior ECG from 01/04/2017 (about 9 months ago)   ____________________________________________  RADIOLOGY I, Hinda Kehr, personally viewed and evaluated these images (plain radiographs) as part of my medical decision making, as well as reviewing the written report by the radiologist.  ED MD interpretation: Hazy opacity and left lingula consistent with the area of activity on recent PET scan.  No  clear evidence of pneumonia.  Official radiology report(s): Dg Chest 2 View  Result Date: 09/27/2017 CLINICAL DATA:  Shortness of breath and chest pain. History of lung carcinoma. EXAM: CHEST - 2 VIEW COMPARISON:  PET-CT Jul 26, 2017; chest radiograph January 15, 2017 FINDINGS: Port-A-Cath tip is at the junction of the left innominate vein and superior vena cava. No pneumothorax. There has been interval clearing  of a lesion arising in the left upper lobe, invading the pleura. No lesion is seen in the left upper quadrant region currently. There is patchy opacity in the left inferior lingula and lower lobe in an area of prior opacity seen on the PET study. A well-defined mass is not seen on this study. No evident edema or consolidation. There are small pleural effusions bilaterally. The heart size is upper normal with pulmonary vascularity within normal limits. There is aortic atherosclerosis. No blastic or lytic bone lesions are evident by radiography. IMPRESSION: Ill-defined opacity in the inferior lingula which could represent scarring or infiltrate. Well-defined mass not seen on this study. Small pleural effusions evident. Heart size upper normal. No adenopathy. There is aortic atherosclerosis. No bone lesions are appreciable. Port-A-Cath tip at junction of left innominate vein and superior vena cava without pneumothorax. Aortic Atherosclerosis (ICD10-I70.0). Electronically Signed   By: Lowella Grip III M.D.   On: 09/27/2017 11:12   Ct Angio Chest Pe W/cm &/or Wo Cm  Result Date: 09/27/2017 CLINICAL DATA:  Shortness of breath and pain. History of lung carcinoma EXAM: CT ANGIOGRAPHY CHEST WITH CONTRAST TECHNIQUE: Multidetector CT imaging of the chest was performed using the standard protocol during bolus administration of intravenous contrast. Multiplanar CT image reconstructions and MIPs were obtained to evaluate the vascular anatomy. CONTRAST:  29mL OMNIPAQUE IOHEXOL 350 MG/ML SOLN COMPARISON:   PET-CT Jul 26, 2017; chest radiograph September 27, 2017 FINDINGS: Cardiovascular: There is no demonstrable pulmonary embolus. There is no thoracic aortic aneurysm or dissection. There is mild atherosclerotic calcification in the proximal visualized great vessels. There are foci of aortic atherosclerosis. There are foci of coronary artery calcification. There is no appreciable pericardial effusion or pericardial thickening. There is a Port-A-Cath with the tip in the superior vena cava just beyond the junction with the left innominate vein. Mediastinum/Nodes: There is a mass arising in the left lobe of the thyroid measuring 1.9 x 1.4 cm which was also present on prior PET study. There is a lymph node anterior to the distal trachea slightly to the right of midline measuring 1.6 x 1.4 cm, essentially stable. There is extensive adenopathy between the left atrium and descending thoracic aorta measuring 4.3 x 2.5 cm. This area has increased since recent study. There is a superior left hilar lymph node measuring 1.2 x 0.8 cm, stable. No esophageal lesions are appreciable. Lungs/Pleura: There is underlying centrilobular emphysematous change. There is partially loculated left pleural effusion, stable. There is again noted a mass arising at the level of the anterior left fourth rib causing bony destruction and pleural thickening. There is adjacent consolidation in a portion of the inferior lingula. This appears somewhat smaller compared to recent PET study. It measures approximately 3.4 x 2.2 cm. There is mild pleural thickening in the right base. No new parenchymal lung lesion identified. There is lower lobe bronchiectatic change bilaterally. No new opacity suggesting pneumonia evident. Upper Abdomen: There is atherosclerotic calcification in the upper abdominal aorta and major mesenteric vessels. Gallbladder is absent. There are bilateral adrenal adenomas based on attenuation values. Adenoma on the right measures 2.3 x 1.5 cm.  Adenoma on the left measures 2.0 x 1.7 cm. Musculoskeletal: There is destruction of a portion of the anterior left fourth rib. There is sclerosis and old fracture in the left tenth rib, likely due to treated metastasis in this area. No new bony lesions are evident. Review of the MIP images confirms the above findings. IMPRESSION: 1. No demonstrable pulmonary embolus.  No thoracic aortic aneurysm or dissection. There is aortic atherosclerosis. There are foci of calcification in coronary arteries as well as great vessels and major mesenteric vessels. 2. There is again noted a mass arising in the chest wall on the left anteriorly with adjacent opacity in the inferior lingula. There is partial destruction of the anterior left fourth rib, stable. This lesion overall is slightly smaller compared to most recent PET study. 3. Partially loculated left pleural effusion, stable. Pleural thickening posterior right base. 4. Underlying emphysematous change with bibasilar atelectasis. No findings felt to represent acute pneumonia. 5. Area of adenopathy between the left atrium and descending aorta, larger than on previous study. Stable pretracheal lymph node and left hilar lymph node. 6. **An incidental finding of potential clinical significance has been found. Dominant mass left lobe thyroid. Consider further evaluation with thyroid ultrasound. If patient is clinically hyperthyroid, consider nuclear medicine thyroid uptake and scan.** 7.  Bilateral adrenal adenomas. 8. Sclerosis with prior fracture lateral left tenth rib. Suspect treated metastasis. Aortic Atherosclerosis (ICD10-I70.0) and Emphysema (ICD10-J43.9). Electronically Signed   By: Lowella Grip III M.D.   On: 09/27/2017 13:24    ____________________________________________   PROCEDURES  Critical Care performed: No   Procedure(s) performed:   Procedures   ____________________________________________   INITIAL IMPRESSION / ASSESSMENT AND PLAN / ED  COURSE  As part of my medical decision making, I reviewed the following data within the [place] notes reviewed and incorporated, Labs reviewed , EKG interpreted , Old EKG reviewed, Old chart reviewed, Radiograph reviewed , A phone consult was requested and obtained from this/these consultant(s) (oncology, Dr. Grayland Ormond) and Notes from prior ED visits    Differential diagnosis includes, but is not limited to, pulmonary embolism,  Healthcare associated pneumonia, worsening lung cancer, pneumothorax, pleural effusion, pericardial effusion.  The patient is in no acute distress at this time.  Her vital signs are notable for a very slight degree of tachypnea and some tachycardia with chronic atrial fibrillation.  She is easily conversant, having no difficulty swallowing or speaking, no difficulty breathing at this time.  Chest x-ray suggested possible left lower lobe opacity but this is the area that corresponds to a known mass.  Given the history, I think it is more likely she may be having some pain associated with a pneumonia or possibly even a pulmonary embolism, greater probability of these diagnoses than of healthcare associated pneumonia.  Her initial triage respiratory rate was 22 but when I saw her her respiratory rate was normal and she does not meet sepsis criteria at this time.  Lactic acid is pending as is the rest of her blood work and I anticipate getting a CTA chest to rule out pulmonary embolism as well as to further evaluate for possible interstitial pneumonia.  After the work-up is complete I will discuss the case with Dr. Grayland Ormond.  She has an appointment in 5 days with him in clinic.  She and her family understand and agree with the plan.  Clinical Course as of Sep 28 1527  Thu Sep 27, 2017  1221 Lactic Acid, Venous: 0.8 [CF]  1228 Conference of metabolic panel generally reassuring.  Kidney function is normal.  Given the high pretest probability and current  symptoms of shortness of breath and pain with deep inspiration, I am proceeding with CT angio chest to rule out pulmonary embolism as well as to further evaluate the possibility of interstitial pneumonia.  I am treating the tachycardia and trying  to provide some renal protection by giving normal saline 500 mL bolus.  Creatinine: 0.85 [CF]  1335 Generally reassuring CTA chest with no evidence of acute abnormalities.  Calling Dr. Grayland Ormond to discuss.  CT Angio Chest PE W/Cm &/Or Wo Cm [CF]  1358 I discussed the case by phone with Dr. Grayland Ormond.  His main concern was shortness of breath and pain associate with a cardiac cause but was reassured by the initial negative troponin and EKG unchanged from prior.  I explained to him the reassuring vital signs and work-up including CTA.  He is comfortable with close outpatient follow-up and agreed with my plan for a second troponin as additional rule out.  I will discuss this with the patient and family.  I ordered the second troponin for 3 hours after the original, at 2:30 PM.   [CF]  1528 Repeat troponin is normal.  I will discharge the patient as per the plan as described above.  I gave my usual and customary return precautions.   [CF]    Clinical Course User Index [CF] Hinda Kehr, MD    ____________________________________________  FINAL CLINICAL IMPRESSION(S) / ED DIAGNOSES  Final diagnoses:  SOB (shortness of breath)  Atypical chest pain     MEDICATIONS GIVEN DURING THIS VISIT:  Medications  sodium chloride 0.9 % bolus 500 mL (0 mLs Intravenous Stopped 09/27/17 1430)  iohexol (OMNIPAQUE) 350 MG/ML injection 75 mL (75 mLs Intravenous Contrast Given 09/27/17 1259)     ED Discharge Orders    None       Note:  This document was prepared using Dragon voice recognition software and may include unintentional dictation errors.    Hinda Kehr, MD 09/27/17 [phone]

## 2017-09-27 NOTE — ED Notes (Signed)
Patient returned from radiology at this time. Family at bedside.

## 2017-09-27 NOTE — Telephone Encounter (Signed)
Patient / fam instructed per VO Dr Grayland Ormond that she is to go to ER for evaluation of her chest pain. He states this has nothing to do with the immunotherapy she is on and we cannot do anything for her here. Patient agrees to go to ER stating  "I don't want to go, but I will if he says so"

## 2017-09-27 NOTE — ED Notes (Signed)
ED Provider at bedside. 

## 2017-09-27 NOTE — ED Triage Notes (Signed)
Pt via pov from home with sob and chest/throat pain. Per caregiver, her bp was 76/3? At home on wrist machine. Pt states her throat hurts, only when she breathes, and that it began last night, making her sleep restless last night. Pt has stage 4 lung cancer; taking [place]. Pt alert & oriented, some breathlessness noted during exertion/transfer, etc.

## 2017-09-27 NOTE — Discharge Instructions (Addendum)
Your workup in the Emergency Department today was reassuring.  We did not find any specific new or concerning abnormalities other than your known medical issues.  We recommend you drink plenty of fluids, take your regular medications and/or any new ones prescribed today, and follow up with the doctor(s) listed in these documents as recommended.  Return to the Emergency Department if you develop new or worsening symptoms that concern you.

## 2017-09-27 NOTE — Telephone Encounter (Signed)
[name] called requesting patient be seen for shortness of breath and chest pain, reports nausea time 5 days. Refusing to go to ER. Pain is in entire chest, sudden onset this morning. Worse with breathing, rafiating into left throat. Per Alease Medina, NP have patient come in to office. Jodi Mourning is bringing her now

## 2017-09-27 NOTE — ED Notes (Signed)
Patient transported to X-ray 

## 2017-09-30 NOTE — Progress Notes (Signed)
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  INTERVAL HISTORY: Patient returns to clinic today for further evaluation and consideration of cycle 11 of maintenance Keytruda.  She was recently seen in the emergency room with chest pain radiating to her jaw, but cardiac work-up was reported as negative.  She also had a CT of the chest that revealed mild improvement of disease.  She currently feels well and is asymptomatic.  She does not complain of headache or other neurologic complaints today. She has a good appetite and denies weight loss. She denies any pain.  She has no chest pain, shortness of breath, cough, or hemoptysis. She denies any nausea, vomiting, constipation, or diarrhea. She has no urinary complaints.  Patient feels at her baseline offers no specific complaints today.  REVIEW OF SYSTEMS:   Review of Systems  Constitutional: Negative.  Negative for fever, malaise/fatigue and weight loss.  Respiratory: Negative.  Negative for cough, hemoptysis and shortness of breath.   Cardiovascular: Negative.  Negative for chest pain and leg swelling.  Gastrointestinal: Negative.  Negative for abdominal pain and diarrhea.  Genitourinary: Negative.  Negative for flank pain.  Musculoskeletal: Positive for back pain. Negative for falls and joint pain.  Skin: Negative.  Negative for itching and rash.  Neurological: Negative.  Negative for sensory change, focal weakness, weakness and headaches.  Psychiatric/Behavioral: Negative.  The patient is not nervous/anxious and does not have insomnia.     As per HPI. Otherwise, a complete review of systems is negative.  PAST MEDICAL HISTORY: Past Medical History:  Diagnosis  Date  . COPD (chronic obstructive pulmonary disease) ([place])   . GU (gastric peptic ulcer)   . Lung cancer ([place])   . Malignant pleural effusion   . PAF (paroxysmal atrial fibrillation) ([place]) 10/2016   a. 10/2016 post thoracentesis ; b. 10/2016 Echo: EF 65-70%, mild LVH;  c. Amio/Eliquis initiated (CHA2DSVASc = 3).  . Pneumonia   . Wrist fracture 1993   bilateral    PAST SURGICAL HISTORY: Past Surgical History:  Procedure Laterality Date  . ABDOMINAL HYSTERECTOMY    . CATARACT EXTRACTION W/ INTRAOCULAR LENS  IMPLANT, BILATERAL    . CHEST TUBE INSERTION Left 12/18/2016   Procedure: CHEST TUBE INSERTION;  Surgeon: [name], MD;  Location: [facility] ORS;  Service: General;  Laterality: Left;  . CHOLECYSTECTOMY    . CLOSED REDUCTION WRIST FRACTURE Left 01/04/2017   Procedure: CLOSED REDUCTION WRIST;  Surgeon: Thornton Park, MD;  Location: [facility] ORS;  Service: Orthopedics;  Laterality: Left;  . PORTACATH PLACEMENT Left 12/18/2016   Procedure: INSERTION PORT-A-CATH;  Surgeon: [name], MD;  Location: [facility] ORS;  Service: General;  Laterality: Left;  . REMOVAL OF PLEURAL DRAINAGE CATHETER N/A 01/02/2017   Procedure: REMOVAL OF PLEURAL DRAINAGE CATHETER;  Surgeon: [name], MD;  Location: [facility] ORS;  Service: Thoracic;  Laterality: N/A;  . TONSILLECTOMY      FAMILY HISTORY: Family History  Problem Relation Age of Onset  . Hypertension Father   . Heart disease Father   . Heart attack Father   . Congestive Heart Failure Mother   . Stroke Mother   . Atrial fibrillation Sister     ADVANCED DIRECTIVES (Y/N):  N  HEALTH MAINTENANCE: Social  History   Tobacco Use  . Smoking status: Former Smoker    Packs/day: 1.00    Years: 68.00    Pack years: 68.00    Last attempt to quit: 11/06/2016    Years since quitting: 0.9  . Smokeless tobacco: Never Used  Substance Use Topics  . Alcohol use: No  . Drug use: No     Colonoscopy:  PAP:  Bone density:  Lipid panel:  Allergies    Allergen Reactions  . Sulfa Antibiotics Other (See Comments)    Seizures   . Levaquin [Levofloxacin] Hives and Itching    Current Outpatient Medications  Medication Sig Dispense Refill  . apixaban (ELIQUIS) 5 MG TABS tablet Take 1 tablet (5 mg total) by mouth 2 (two) times daily. 60 tablet 0  . BIOTIN PO Take by mouth daily.    . cetirizine (ZYRTEC) 10 MG tablet Take 10 mg by mouth daily as needed for allergies.    . cholecalciferol (VITAMIN D) 1000 units tablet Take 1,000 Units by mouth daily.    Marland Kitchen diltiazem (CARDIZEM CD) 120 MG 24 hr capsule Take 1 capsule (120 mg total) by mouth daily.    . diphenoxylate-atropine (LOMOTIL) 2.5-0.025 MG tablet Take 1 tablet 4 (four) times daily as needed by mouth for diarrhea or loose stools. 120 tablet 0  . escitalopram (LEXAPRO) 5 MG tablet Take 5 mg by mouth daily.    . furosemide (LASIX) 20 MG tablet Take 1 tablet by mouth daily as needed. For EDEMA  3  . HYDROcodone-acetaminophen (NORCO) 5-325 MG tablet Take 1 tablet by mouth every 4 (four) hours as needed for moderate pain. 30 tablet 0  . lidocaine-prilocaine (EMLA) cream APPLY TO AFFECTED AREA ONCE  3  . mirtazapine (REMERON) 7.5 MG tablet Take 7.5 mg by mouth at bedtime.    . Multiple Vitamins-Minerals (MULTIVITAMIN ADULT PO) Take by mouth.    Marland Kitchen omeprazole (PRILOSEC) 20 MG capsule Take 1 capsule (20 mg total) by mouth daily. 90 capsule 2  . ondansetron (ZOFRAN) 8 MG tablet TAKE 1 TABLET TWO TIMES DAILY AS NEEDED FOR REFRACTORY NAUSEA OR VOMITING.  120 tablet 0  . potassium chloride SA (K-DUR,KLOR-CON) 20 MEQ tablet Take 20 mEq 2 (two) times daily by mouth.    . prochlorperazine (COMPAZINE) 10 MG tablet Take 1 tablet (10 mg total) by mouth every 6 (six) hours as needed (Nausea or vomiting). 60 tablet 2   No current facility-administered medications for this visit.    Facility-Administered Medications Ordered in Other Visits  Medication Dose Route Frequency Provider Last Rate Last Dose  .  heparin lock flush 100 unit/mL  500 Units Intravenous Once [name], MD      . sodium chloride flush (NS) 0.9 % injection 10 mL  10 mL Intravenous Once [name], MD        OBJECTIVE: Vitals:   10/02/17 1058  BP: 127/80  Pulse: 84  Resp: 18  Temp: 97.8 F (36.6 C)     Body mass index is 29.53 kg/m.    ECOG FS:0 - Asymptomatic  General: Well-developed, well-nourished, no acute distress. Eyes: Pink conjunctiva, anicteric sclera. HEENT: Normocephalic, moist mucous membranes, clear oropharnyx. Lungs: Clear to auscultation bilaterally. Heart: Regular rate and rhythm. No rubs, murmurs, or gallops. Abdomen: Soft, nontender, nondistended. No organomegaly noted, normoactive bowel sounds. Musculoskeletal: No edema, cyanosis, or clubbing. Neuro: Alert, answering all questions appropriately. Cranial nerves grossly intact. Skin: No rashes or petechiae noted. Psych: Normal affect.  LAB  RESULTS:  Lab Results  Component Value Date   NA 137 10/02/2017   K 3.6 10/02/2017   CL 104 10/02/2017   CO2 23 10/02/2017   GLUCOSE 115 (H) 10/02/2017   BUN 13 10/02/2017   CREATININE 0.95 10/02/2017   CALCIUM 8.7 (L) 10/02/2017   PROT 6.6 10/02/2017   ALBUMIN 3.2 (L) 10/02/2017   AST 14 (L) 10/02/2017   ALT 7 10/02/2017   ALKPHOS 109 10/02/2017   BILITOT 0.5 10/02/2017   GFRNONAA 54 (L) 10/02/2017   GFRAA >60 10/02/2017    Lab Results  Component Value Date   WBC 9.6 10/02/2017   NEUTROABS 7.3 (H) 10/02/2017   HGB 10.0 (L) 10/02/2017   HCT 29.7 (L) 10/02/2017   MCV 77.0 (L) 10/02/2017   PLT 321 10/02/2017     STUDIES: Dg Chest 2 View  Result Date: 09/27/2017 CLINICAL DATA:  Shortness of breath and chest pain. History of lung carcinoma. EXAM: CHEST - 2 VIEW COMPARISON:  PET-CT Jul 26, 2017; chest radiograph January 15, 2017 FINDINGS: Port-A-Cath tip is at the junction of the left innominate vein and superior vena cava. No pneumothorax. There has been interval clearing  of a lesion arising in the left upper lobe, invading the pleura. No lesion is seen in the left upper quadrant region currently. There is patchy opacity in the left inferior lingula and lower lobe in an area of prior opacity seen on the PET study. A well-defined mass is not seen on this study. No evident edema or consolidation. There are small pleural effusions bilaterally. The heart size is upper normal with pulmonary vascularity within normal limits. There is aortic atherosclerosis. No blastic or lytic bone lesions are evident by radiography. IMPRESSION: Ill-defined opacity in the inferior lingula which could represent scarring or infiltrate. Well-defined mass not seen on this study. Small pleural effusions evident. Heart size upper normal. No adenopathy. There is aortic atherosclerosis. No bone lesions are appreciable. Port-A-Cath tip at junction of left innominate vein and superior vena cava without pneumothorax. Aortic Atherosclerosis (ICD10-I70.0). Electronically Signed   By: Lowella Grip III M.D.   On: 09/27/2017 11:12   Ct Angio Chest Pe W/cm &/or Wo Cm  Result Date: 09/27/2017 CLINICAL DATA:  Shortness of breath and pain. History of lung carcinoma EXAM: CT ANGIOGRAPHY CHEST WITH CONTRAST TECHNIQUE: Multidetector CT imaging of the chest was performed using the standard protocol during bolus administration of intravenous contrast. Multiplanar CT image reconstructions and MIPs were obtained to evaluate the vascular anatomy. CONTRAST:  50m OMNIPAQUE IOHEXOL 350 MG/ML SOLN COMPARISON:  PET-CT Jul 26, 2017; chest radiograph September 27, 2017 FINDINGS: Cardiovascular: There is no demonstrable pulmonary embolus. There is no thoracic aortic aneurysm or dissection. There is mild atherosclerotic calcification in the proximal visualized great vessels. There are foci of aortic atherosclerosis. There are foci of coronary artery calcification. There is no appreciable pericardial effusion or pericardial thickening.  There is a Port-A-Cath with the tip in the superior vena cava just beyond the junction with the left innominate vein. Mediastinum/Nodes: There is a mass arising in the left lobe of the thyroid measuring 1.9 x 1.4 cm which was also present on prior PET study. There is a lymph node anterior to the distal trachea slightly to the right of midline measuring 1.6 x 1.4 cm, essentially stable. There is extensive adenopathy between the left atrium and descending thoracic aorta measuring 4.3 x 2.5 cm. This area has increased since recent study. There is a superior left hilar lymph node  measuring 1.2 x 0.8 cm, stable. No esophageal lesions are appreciable. Lungs/Pleura: There is underlying centrilobular emphysematous change. There is partially loculated left pleural effusion, stable. There is again noted a mass arising at the level of the anterior left fourth rib causing bony destruction and pleural thickening. There is adjacent consolidation in a portion of the inferior lingula. This appears somewhat smaller compared to recent PET study. It measures approximately 3.4 x 2.2 cm. There is mild pleural thickening in the right base. No new parenchymal lung lesion identified. There is lower lobe bronchiectatic change bilaterally. No new opacity suggesting pneumonia evident. Upper Abdomen: There is atherosclerotic calcification in the upper abdominal aorta and major mesenteric vessels. Gallbladder is absent. There are bilateral adrenal adenomas based on attenuation values. Adenoma on the right measures 2.3 x 1.5 cm. Adenoma on the left measures 2.0 x 1.7 cm. Musculoskeletal: There is destruction of a portion of the anterior left fourth rib. There is sclerosis and old fracture in the left tenth rib, likely due to treated metastasis in this area. No new bony lesions are evident. Review of the MIP images confirms the above findings. IMPRESSION: 1. No demonstrable pulmonary embolus. No thoracic aortic aneurysm or dissection. There is  aortic atherosclerosis. There are foci of calcification in coronary arteries as well as great vessels and major mesenteric vessels. 2. There is again noted a mass arising in the chest wall on the left anteriorly with adjacent opacity in the inferior lingula. There is partial destruction of the anterior left fourth rib, stable. This lesion overall is slightly smaller compared to most recent PET study. 3. Partially loculated left pleural effusion, stable. Pleural thickening posterior right base. 4. Underlying emphysematous change with bibasilar atelectasis. No findings felt to represent acute pneumonia. 5. Area of adenopathy between the left atrium and descending aorta, larger than on previous study. Stable pretracheal lymph node and left hilar lymph node. 6. **An incidental finding of potential clinical significance has been found. Dominant mass left lobe thyroid. Consider further evaluation with thyroid ultrasound. If patient is clinically hyperthyroid, consider nuclear medicine thyroid uptake and scan.** 7.  Bilateral adrenal adenomas. 8. Sclerosis with prior fracture lateral left tenth rib. Suspect treated metastasis. Aortic Atherosclerosis (ICD10-I70.0) and Emphysema (ICD10-J43.9). Electronically Signed   By: Lowella Grip III M.D.   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  PLAN:    1. 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  CT scan while in the ER on September 27, 2017 revealed mild improvement of disease.  She has now completed her XRT. Given the multiple problems she previously had with chemotherapy treatment, including declining performance status, this was discontinued and proceeded with second line Keytruda.  Patient will receive this every 3 weeks until intolerable side effects or  progression of disease.  Proceed with cycle 11 of Keytruda today.  Return to clinic in 3 weeks for further evaluation and consideration of cycle 12.   2.  Headaches/neck pain: Patient does not complain of this today.  MRI of the brain on August 31, 2017 did not reveal any metastatic disease.   3. Pain: Improved with XRT.  Patient does not complain of this today.  Continue current narcotic regimen. 4.  Anemia: Patient's hemoglobin is decreased, but stable at 10.0  No intervention needed at  this time. 5.  Chest pain: No PE and noncardiac in nature.  Resolved.    Patient expressed understanding and was in agreement with this plan. She also understands that She can call clinic at any time with any questions, concerns, or complaints.   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  INTERVAL HISTORY: Patient returns to clinic today for further evaluation and consideration of cycle 4 of Keytruda.  She is tolerating her treatments well without significant side effects.  She currently feels well and is asymptomatic.  She has a good appetite and no longer complains of weight loss. She does not complain of weakness or fatigue today. She has no neurologic complaints. She does not complain of rib or arm pain today. She has no chest pain, shortness of breath, cough, or hemoptysis. She denies any nausea, vomiting, constipation, or diarrhea. She has no urinary complaints. Patient  offers no specific complaints today.  REVIEW OF SYSTEMS:   Review of Systems  Constitutional: Negative.  Negative for fever, malaise/fatigue and weight loss.  Respiratory: Negative.  Negative for cough and shortness of breath.   Cardiovascular: Negative.  Negative for chest pain and leg swelling.  Gastrointestinal: Negative.  Negative for abdominal pain and diarrhea.  Genitourinary: Negative.   Musculoskeletal: Negative.  Negative for back pain, falls and joint pain.  Skin: Negative.  Negative for itching and rash.  Neurological: Negative.  Negative for sensory change and weakness.  Psychiatric/Behavioral: Negative.  The patient is not nervous/anxious.     As per HPI. Otherwise, a complete review of systems is negative.  PAST MEDICAL HISTORY: Past Medical History:  Diagnosis Date  . COPD (chronic obstructive pulmonary disease) ([place])   . GU (gastric peptic ulcer)   . Lung cancer ([place])   . Malignant pleural effusion   . PAF (paroxysmal atrial fibrillation) ([place]) 10/2016   a. 10/2016 post thoracentesis ; b. 10/2016 Echo: EF 65-70%, mild LVH;  c. Amio/Eliquis initiated (CHA2DSVASc = 3).  . Pneumonia   . Wrist fracture 1993   bilateral    PAST SURGICAL HISTORY: Past Surgical History:  Procedure Laterality Date  . ABDOMINAL HYSTERECTOMY    . CATARACT EXTRACTION W/ INTRAOCULAR LENS  IMPLANT, BILATERAL    . CHEST TUBE INSERTION Left 12/18/2016   Procedure: CHEST TUBE INSERTION;  Surgeon: [name], MD;  Location: [facility] ORS;  Service: General;  Laterality: Left;  . CHOLECYSTECTOMY    . CLOSED REDUCTION WRIST FRACTURE Left 01/04/2017   Procedure: CLOSED REDUCTION WRIST;  Surgeon: Thornton Park, MD;  Location: [facility] ORS;  Service: Orthopedics;  Laterality: Left;  . PORTACATH PLACEMENT Left 12/18/2016   Procedure: INSERTION PORT-A-CATH;  Surgeon: [name], MD;  Location: [facility] ORS;  Service: General;  Laterality: Left;  . REMOVAL OF PLEURAL DRAINAGE CATHETER N/A 01/02/2017    Procedure: REMOVAL OF PLEURAL DRAINAGE CATHETER;  Surgeon: [name], MD;  Location: [facility] ORS;  Service: Thoracic;  Laterality: N/A;  . TONSILLECTOMY  FAMILY HISTORY: Family History  Problem Relation Age of Onset  . Hypertension Father   . Heart disease Father   . Heart attack Father   . Congestive Heart Failure Mother   . Stroke Mother   . Atrial fibrillation Sister     ADVANCED DIRECTIVES (Y/N):  N  HEALTH MAINTENANCE: Social History   Tobacco Use  . Smoking status: Former Smoker    Packs/day: 1.00    Years: 68.00    Pack years: 68.00    Last attempt to quit: 11/06/2016    Years since quitting: 0.9  . Smokeless tobacco: Never Used  Substance Use Topics  . Alcohol use: No  . Drug use: No     Colonoscopy:  PAP:  Bone density:  Lipid panel:  Allergies  Allergen Reactions  . Sulfa Antibiotics Other (See Comments)    Seizures   . Levaquin [Levofloxacin] Hives and Itching    Current Outpatient Medications  Medication Sig Dispense Refill  . apixaban (ELIQUIS) 5 MG TABS tablet Take 1 tablet (5 mg total) by mouth 2 (two) times daily. 60 tablet 0  . BIOTIN PO Take by mouth daily.    . cetirizine (ZYRTEC) 10 MG tablet Take 10 mg by mouth daily as needed for allergies.    . cholecalciferol (VITAMIN D) 1000 units tablet Take 1,000 Units by mouth daily.    Marland Kitchen diltiazem (CARDIZEM CD) 120 MG 24 hr capsule Take 1 capsule (120 mg total) by mouth daily.    . diphenoxylate-atropine (LOMOTIL) 2.5-0.025 MG tablet Take 1 tablet 4 (four) times daily as needed by mouth for diarrhea or loose stools. 120 tablet 0  . escitalopram (LEXAPRO) 5 MG tablet Take 5 mg by mouth daily.    . furosemide (LASIX) 20 MG tablet Take 1 tablet by mouth daily as needed. For EDEMA  3  . HYDROcodone-acetaminophen (NORCO) 5-325 MG tablet Take 1 tablet by mouth every 4 (four) hours as needed for moderate pain. 30 tablet 0  . lidocaine-prilocaine (EMLA) cream APPLY TO AFFECTED AREA ONCE  3  .  mirtazapine (REMERON) 7.5 MG tablet Take 7.5 mg by mouth at bedtime.    . Multiple Vitamins-Minerals (MULTIVITAMIN ADULT PO) Take by mouth.    Marland Kitchen omeprazole (PRILOSEC) 20 MG capsule Take 1 capsule (20 mg total) by mouth daily. 90 capsule 2  . ondansetron (ZOFRAN) 8 MG tablet TAKE 1 TABLET TWO TIMES DAILY AS NEEDED FOR REFRACTORY NAUSEA OR VOMITING.  120 tablet 0  . potassium chloride SA (K-DUR,KLOR-CON) 20 MEQ tablet Take 20 mEq 2 (two) times daily by mouth.    . prochlorperazine (COMPAZINE) 10 MG tablet Take 1 tablet (10 mg total) by mouth every 6 (six) hours as needed (Nausea or vomiting). 60 tablet 2   No current facility-administered medications for this visit.    Facility-Administered Medications Ordered in Other Visits  Medication Dose Route Frequency Provider Last Rate Last Dose  . heparin lock flush 100 unit/mL  500 Units Intravenous Once [name], MD      . sodium chloride flush (NS) 0.9 % injection 10 mL  10 mL Intravenous Once [name], MD        OBJECTIVE: Vitals:   10/02/17 1058  BP: 127/80  Pulse: 84  Resp: 18  Temp: 97.8 F (36.6 C)     Body mass index is 29.53 kg/m.    ECOG FS:1 - Symptomatic but completely ambulatory  General: Well-developed, well-nourished, no acute distress. Eyes: Pink conjunctiva, anicteric sclera. Lungs:  Clear to auscultation bilaterally. Heart: Regular rate and rhythm. No rubs, murmurs, or gallops. Abdomen: Soft, nontender, nondistended. No organomegaly noted, normoactive bowel sounds. Musculoskeletal: No edema, cyanosis, or clubbing. Left arm in cast. Neuro: Alert, answering all questions appropriately. Cranial nerves grossly intact. Skin: Maculopapular rash noted, Improved. Psych: Normal affect.    LAB RESULTS:  Lab Results  Component Value Date   NA 137 10/02/2017   K 3.6 10/02/2017   CL 104 10/02/2017   CO2 23 10/02/2017   GLUCOSE 115 (H) 10/02/2017   BUN 13 10/02/2017   CREATININE 0.95 10/02/2017    CALCIUM 8.7 (L) 10/02/2017   PROT 6.6 10/02/2017   ALBUMIN 3.2 (L) 10/02/2017   AST 14 (L) 10/02/2017   ALT 7 10/02/2017   ALKPHOS 109 10/02/2017   BILITOT 0.5 10/02/2017   GFRNONAA 54 (L) 10/02/2017   GFRAA >60 10/02/2017    Lab Results  Component Value Date   WBC 9.6 10/02/2017   NEUTROABS 7.3 (H) 10/02/2017   HGB 10.0 (L) 10/02/2017   HCT 29.7 (L) 10/02/2017   MCV 77.0 (L) 10/02/2017   PLT 321 10/02/2017     STUDIES: Dg Chest 2 View  Result Date: 09/27/2017 CLINICAL DATA:  Shortness of breath and chest pain. History of lung carcinoma. EXAM: CHEST - 2 VIEW COMPARISON:  PET-CT Jul 26, 2017; chest radiograph January 15, 2017 FINDINGS: Port-A-Cath tip is at the junction of the left innominate vein and superior vena cava. No pneumothorax. There has been interval clearing of a lesion arising in the left upper lobe, invading the pleura. No lesion is seen in the left upper quadrant region currently. There is patchy opacity in the left inferior lingula and lower lobe in an area of prior opacity seen on the PET study. A well-defined mass is not seen on this study. No evident edema or consolidation. There are small pleural effusions bilaterally. The heart size is upper normal with pulmonary vascularity within normal limits. There is aortic atherosclerosis. No blastic or lytic bone lesions are evident by radiography. IMPRESSION: Ill-defined opacity in the inferior lingula which could represent scarring or infiltrate. Well-defined mass not seen on this study. Small pleural effusions evident. Heart size upper normal. No adenopathy. There is aortic atherosclerosis. No bone lesions are appreciable. Port-A-Cath tip at junction of left innominate vein and superior vena cava without pneumothorax. Aortic Atherosclerosis (ICD10-I70.0). Electronically Signed   By: Lowella Grip III M.D.   On: 09/27/2017 11:12   Ct Angio Chest Pe W/cm &/or Wo Cm  Result Date: 09/27/2017 CLINICAL DATA:  Shortness of  breath and pain. History of lung carcinoma EXAM: CT ANGIOGRAPHY CHEST WITH CONTRAST TECHNIQUE: Multidetector CT imaging of the chest was performed using the standard protocol during bolus administration of intravenous contrast. Multiplanar CT image reconstructions and MIPs were obtained to evaluate the vascular anatomy. CONTRAST:  58m OMNIPAQUE IOHEXOL 350 MG/ML SOLN COMPARISON:  PET-CT Jul 26, 2017; chest radiograph September 27, 2017 FINDINGS: Cardiovascular: There is no demonstrable pulmonary embolus. There is no thoracic aortic aneurysm or dissection. There is mild atherosclerotic calcification in the proximal visualized great vessels. There are foci of aortic atherosclerosis. There are foci of coronary artery calcification. There is no appreciable pericardial effusion or pericardial thickening. There is a Port-A-Cath with the tip in the superior vena cava just beyond the junction with the left innominate vein. Mediastinum/Nodes: There is a mass arising in the left lobe of the thyroid measuring 1.9 x 1.4 cm which was also present on prior PET  study. There is a lymph node anterior to the distal trachea slightly to the right of midline measuring 1.6 x 1.4 cm, essentially stable. There is extensive adenopathy between the left atrium and descending thoracic aorta measuring 4.3 x 2.5 cm. This area has increased since recent study. There is a superior left hilar lymph node measuring 1.2 x 0.8 cm, stable. No esophageal lesions are appreciable. Lungs/Pleura: There is underlying centrilobular emphysematous change. There is partially loculated left pleural effusion, stable. There is again noted a mass arising at the level of the anterior left fourth rib causing bony destruction and pleural thickening. There is adjacent consolidation in a portion of the inferior lingula. This appears somewhat smaller compared to recent PET study. It measures approximately 3.4 x 2.2 cm. There is mild pleural thickening in the right base. No new  parenchymal lung lesion identified. There is lower lobe bronchiectatic change bilaterally. No new opacity suggesting pneumonia evident. Upper Abdomen: There is atherosclerotic calcification in the upper abdominal aorta and major mesenteric vessels. Gallbladder is absent. There are bilateral adrenal adenomas based on attenuation values. Adenoma on the right measures 2.3 x 1.5 cm. Adenoma on the left measures 2.0 x 1.7 cm. Musculoskeletal: There is destruction of a portion of the anterior left fourth rib. There is sclerosis and old fracture in the left tenth rib, likely due to treated metastasis in this area. No new bony lesions are evident. Review of the MIP images confirms the above findings. IMPRESSION: 1. No demonstrable pulmonary embolus. No thoracic aortic aneurysm or dissection. There is aortic atherosclerosis. There are foci of calcification in coronary arteries as well as great vessels and major mesenteric vessels. 2. There is again noted a mass arising in the chest wall on the left anteriorly with adjacent opacity in the inferior lingula. There is partial destruction of the anterior left fourth rib, stable. This lesion overall is slightly smaller compared to most recent PET study. 3. Partially loculated left pleural effusion, stable. Pleural thickening posterior right base. 4. Underlying emphysematous change with bibasilar atelectasis. No findings felt to represent acute pneumonia. 5. Area of adenopathy between the left atrium and descending aorta, larger than on previous study. Stable pretracheal lymph node and left hilar lymph node. 6. **An incidental finding of potential clinical significance has been found. Dominant mass left lobe thyroid. Consider further evaluation with thyroid ultrasound. If patient is clinically hyperthyroid, consider nuclear medicine thyroid uptake and scan.** 7.  Bilateral adrenal adenomas. 8. Sclerosis with prior fracture lateral left tenth rib. Suspect treated metastasis. Aortic  Atherosclerosis (ICD10-I70.0) and Emphysema (ICD10-J43.9). Electronically Signed   By: Lowella Grip III M.D.   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  PLAN:    1. 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  Overall she has improvement of disease burden, but the pleural-based lesion in her left anterior fourth rib appears to be worse.  Will discuss further at cancer conference tomorrow.  Patient refused an MRI of the brain, but did have a head CT on January 02, 2017 that did not report metastatic disease. Treatment was initially delayed secondary to her broken arm.  Given the multiple problems she has had with chemotherapy treatment, including declining performance status, this was discontinued and proceeded with second line Keytruda.  Patient will receive this every 3 weeks until intolerable side effects or progression of disease.  Proceed with cycle 6 of single agent Keytruda.    Patient is going on a trip, therefore she will return to clinic in 4 weeks for consideration of cycle 7.   2. Broken arm: Continue follow-up with orthopedics as scheduled.  Patient reports that she likely does not require surgery. 3. Pain: Patient does not complain of this today.  Continue current narcotic regimen. 4.  Anemia: Mild, monitor.  Approximately 30 minutes was spent in discussion of which greater than 50% was consultation.   Patient expressed understanding and was in agreement with this plan. She also understands that She can call clinic at any time with any questions, concerns, or complaints.   Cancer Staging Squamous cell carcinoma lung, left ([place]) Staging form: Lung, AJCC 8th Edition - Clinical stage from 12/13/2016: Stage IVA (cT4, cN0, cM1a) - Signed by [name], MD on 12/13/2016   [name], MD   10/05/2017 4:10 PM

## 2017-10-01 ENCOUNTER — Telehealth: Payer: Self-pay | Admitting: *Deleted

## 2017-10-01 ENCOUNTER — Other Ambulatory Visit: Payer: Self-pay | Admitting: *Deleted

## 2017-10-01 ENCOUNTER — Other Ambulatory Visit: Payer: Self-pay | Admitting: Oncology

## 2017-10-01 MED ORDER — HYDROCODONE-ACETAMINOPHEN 5-325 MG PO TABS: 1.0000 | ORAL_TABLET | ORAL | 0 refills | Status: DC | PRN

## 2017-10-01 NOTE — Telephone Encounter (Signed)
Order pended for MD signature.

## 2017-10-01 NOTE — Telephone Encounter (Signed)
Patient's daughter called to request pain medication refill because her mother is going out of town for 3 weeks and will run out of current prescription. If script can not be refilled please call daughter.

## 2017-10-01 NOTE — Telephone Encounter (Signed)
Ok to refill 

## 2017-10-02 ENCOUNTER — Encounter: Payer: Self-pay | Admitting: Oncology

## 2017-10-02 ENCOUNTER — Inpatient Hospital Stay: Payer: Medicare HMO

## 2017-10-02 ENCOUNTER — Inpatient Hospital Stay (HOSPITAL_BASED_OUTPATIENT_CLINIC_OR_DEPARTMENT_OTHER): Payer: Medicare HMO | Admitting: Oncology

## 2017-10-02 ENCOUNTER — Inpatient Hospital Stay: Payer: Medicare HMO | Attending: Oncology

## 2017-10-02 VITALS — BP 127/80 | HR 84 | Temp 97.8°F | Resp 18 | Wt 161.4 lb

## 2017-10-02 DIAGNOSIS — Z923 Personal history of irradiation: Secondary | ICD-10-CM | POA: Diagnosis not present

## 2017-10-02 DIAGNOSIS — Z79899 Other long term (current) drug therapy: Secondary | ICD-10-CM

## 2017-10-02 DIAGNOSIS — Z7901 Long term (current) use of anticoagulants: Secondary | ICD-10-CM | POA: Diagnosis not present

## 2017-10-02 DIAGNOSIS — Z87891 Personal history of nicotine dependence: Secondary | ICD-10-CM | POA: Insufficient documentation

## 2017-10-02 DIAGNOSIS — I481 Persistent atrial fibrillation: Secondary | ICD-10-CM | POA: Diagnosis not present

## 2017-10-02 DIAGNOSIS — D649 Anemia, unspecified: Secondary | ICD-10-CM

## 2017-10-02 DIAGNOSIS — C3492 Malignant neoplasm of unspecified part of left bronchus or lung: Secondary | ICD-10-CM | POA: Diagnosis present

## 2017-10-02 DIAGNOSIS — Z5112 Encounter for antineoplastic immunotherapy: Secondary | ICD-10-CM | POA: Diagnosis present

## 2017-10-02 LAB — CBC WITH DIFFERENTIAL/PLATELET
Basophils Absolute: 0.1 10*3/uL (ref 0–0.1)
Basophils Relative: 1 %
EOS PCT: 4 %
Eosinophils Absolute: 0.3 10*3/uL (ref 0–0.7)
HCT: 29.7 % — ABNORMAL LOW (ref 35.0–47.0)
HEMOGLOBIN: 10 g/dL — AB (ref 12.0–16.0)
LYMPHS ABS: 1 10*3/uL (ref 1.0–3.6)
Lymphocytes Relative: 10 %
MCH: 25.8 pg — AB (ref 26.0–34.0)
MCHC: 33.6 g/dL (ref 32.0–36.0)
MCV: 77 fL — AB (ref 80.0–100.0)
Monocytes Absolute: 0.8 10*3/uL (ref 0.2–0.9)
Monocytes Relative: 9 %
Neutro Abs: 7.3 10*3/uL — ABNORMAL HIGH (ref 1.4–6.5)
Neutrophils Relative %: 76 %
PLATELETS: 321 10*3/uL (ref 150–440)
RBC: 3.86 MIL/uL (ref 3.80–5.20)
RDW: 15.5 % — ABNORMAL HIGH (ref 11.5–14.5)
WBC: 9.6 10*3/uL (ref 3.6–11.0)

## 2017-10-02 LAB — COMPREHENSIVE METABOLIC PANEL
ALT: 7 U/L (ref 0–44)
AST: 14 U/L — AB (ref 15–41)
Albumin: 3.2 g/dL — ABNORMAL LOW (ref 3.5–5.0)
Alkaline Phosphatase: 109 U/L (ref 38–126)
Anion gap: 10 (ref 5–15)
BUN: 13 mg/dL (ref 8–23)
CHLORIDE: 104 mmol/L (ref 98–111)
CO2: 23 mmol/L (ref 22–32)
Calcium: 8.7 mg/dL — ABNORMAL LOW (ref 8.9–10.3)
Creatinine, Ser: 0.95 mg/dL (ref 0.44–1.00)
GFR calc Af Amer: >60 mL/min (ref >60)
GFR, EST NON AFRICAN AMERICAN: 54 mL/min — AB (ref >60)
Glucose, Bld: 115 mg/dL — ABNORMAL HIGH (ref 70–99)
Potassium: 3.6 mmol/L (ref 3.5–5.1)
Sodium: 137 mmol/L (ref 135–145)
Total Bilirubin: 0.5 mg/dL (ref 0.3–1.2)
Total Protein: 6.6 g/dL (ref 6.5–8.1)

## 2017-10-02 MED ORDER — SODIUM CHLORIDE 0.9% FLUSH
10.0000 mL | Freq: Once | INTRAVENOUS | Status: AC
  Administered 2017-10-02: 10 mL via INTRAVENOUS
  Filled 2017-10-02: qty 10

## 2017-10-02 MED ORDER — HEPARIN SOD (PORK) LOCK FLUSH 100 UNIT/ML IV SOLN
500.0000 [IU] | Freq: Once | INTRAVENOUS | Status: AC
  Administered 2017-10-02: 500 [IU] via INTRAVENOUS

## 2017-10-02 MED ORDER — SODIUM CHLORIDE 0.9 % IV SOLN
200.0000 mg | Freq: Once | INTRAVENOUS | Status: AC
  Administered 2017-10-02: 200 mg via INTRAVENOUS
  Filled 2017-10-02: qty 8

## 2017-10-02 MED ORDER — HEPARIN SOD (PORK) LOCK FLUSH 100 UNIT/ML IV SOLN
INTRAVENOUS | Status: AC
  Filled 2017-10-02: qty 5

## 2017-10-02 MED ORDER — SODIUM CHLORIDE 0.9 % IV SOLN
Freq: Once | INTRAVENOUS | Status: AC
  Administered 2017-10-02: 12:00:00 via INTRAVENOUS
  Filled 2017-10-02: qty 1000

## 2017-10-02 NOTE — Progress Notes (Signed)
Pt in for follow up, was seen in ER on 09/27/17 for afib.  Reports "have no appetite or desire for food."  Pt is flying to [place] to stay with son for 3weeks.

## 2017-10-03 LAB — THYROID PANEL WITH TSH
Free Thyroxine Index: 2.7 (ref 1.2–4.9)
T3 UPTAKE RATIO: 30 % (ref 24–39)
T4 TOTAL: 8.9 ug/dL (ref 4.5–12.0)
TSH: 1.39 u[IU]/mL (ref 0.450–4.500)

## 2017-10-19 ENCOUNTER — Ambulatory Visit: Payer: Medicare HMO | Admitting: Radiation Oncology

## 2017-10-22 NOTE — Progress Notes (Signed)
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  INTERVAL HISTORY: Patient returns to clinic today for further evaluation and consideration of cycle 13 of maintenance Keytruda.  She has increased weakness and fatigue as well as worsening shortness of breath which she attributes to her atrial fibrillation.  She has appointment with cardiology later today for Holter monitor placement.  Despite this, she recently was in [place] for several weeks on vacation.  She otherwise feels well.  She does not complain of headache or other neurologic complaints today. She has a good appetite and denies weight loss. She denies any chest pain.  She denies any nausea, vomiting, constipation, or diarrhea. She has no urinary complaints.  Patient otherwise feels well and offers no further specific complaints.  REVIEW OF SYSTEMS:   Review of Systems  Constitutional: Positive for malaise/fatigue. Negative for fever and weight loss.  Respiratory: Positive for shortness of breath. Negative for cough and hemoptysis.   Cardiovascular: Negative.  Negative for chest pain and leg swelling.  Gastrointestinal: Negative.  Negative for abdominal pain and diarrhea.  Genitourinary: Negative.  Negative for flank pain.  Musculoskeletal: Negative.  Negative for back pain, falls and joint pain.  Skin: Negative.  Negative for itching and rash.  Neurological: Positive for weakness. Negative for sensory change, focal weakness and headaches.  Psychiatric/Behavioral: Negative.  The patient is not nervous/anxious and does not have insomnia.     As per HPI. Otherwise, a complete review of systems is negative.  PAST MEDICAL HISTORY: Past Medical History:    Diagnosis Date  . COPD (chronic obstructive pulmonary disease) ([place])   . GU (gastric peptic ulcer)   . Lung cancer ([place])   . Malignant pleural effusion   . PAF (paroxysmal atrial fibrillation) ([place]) 10/2016   a. 10/2016 post thoracentesis ; b. 10/2016 Echo: EF 65-70%, mild LVH;  c. Amio/Eliquis initiated (CHA2DSVASc = 3).  . Pneumonia   . Wrist fracture 1993   bilateral    PAST SURGICAL HISTORY: Past Surgical History:  Procedure Laterality Date  . ABDOMINAL HYSTERECTOMY    . CATARACT EXTRACTION W/ INTRAOCULAR LENS  IMPLANT, BILATERAL    . CHEST TUBE INSERTION Left 12/18/2016   Procedure: CHEST TUBE INSERTION;  Surgeon: [name], MD;  Location: [facility] ORS;  Service: General;  Laterality: Left;  . CHOLECYSTECTOMY    . CLOSED REDUCTION WRIST FRACTURE Left 01/04/2017   Procedure: CLOSED REDUCTION WRIST;  Surgeon: Thornton Park, MD;  Location: [facility] ORS;  Service: Orthopedics;  Laterality: Left;  . PORTACATH PLACEMENT Left 12/18/2016   Procedure: INSERTION PORT-A-CATH;  Surgeon: [name], MD;  Location: [facility] ORS;  Service: General;  Laterality: Left;  . REMOVAL OF PLEURAL DRAINAGE CATHETER N/A 01/02/2017   Procedure: REMOVAL OF PLEURAL DRAINAGE CATHETER;  Surgeon: [name], MD;  Location: [facility] ORS;  Service: Thoracic;  Laterality: N/A;  . TONSILLECTOMY      FAMILY HISTORY: Family History  Problem Relation Age of Onset  . Hypertension Father   . Heart disease Father   . Heart attack Father   . Congestive Heart Failure Mother   . Stroke Mother   . Atrial fibrillation Sister     ADVANCED DIRECTIVES (Y/N):  N  HEALTH MAINTENANCE: Social History   Tobacco  Use  . Smoking status: Former Smoker    Packs/day: 1.00    Years: 68.00    Pack years: 68.00    Last attempt to quit: 11/06/2016    Years since quitting: 0.9  . Smokeless tobacco: Never Used  Substance Use Topics  . Alcohol use: No  . Drug use: No     Colonoscopy:  PAP:  Bone density:  Lipid  panel:  Allergies  Allergen Reactions  . Sulfa Antibiotics Other (See Comments)    Seizures   . Levaquin [Levofloxacin] Hives and Itching    Current Outpatient Medications  Medication Sig Dispense Refill  . apixaban (ELIQUIS) 5 MG TABS tablet Take 1 tablet (5 mg total) by mouth 2 (two) times daily. 60 tablet 0  . BIOTIN PO Take by mouth daily.    . cetirizine (ZYRTEC) 10 MG tablet Take 10 mg by mouth daily as needed for allergies.    . cholecalciferol (VITAMIN D) 1000 units tablet Take 1,000 Units by mouth daily.    Marland Kitchen diltiazem (CARDIZEM CD) 120 MG 24 hr capsule Take 1 capsule (120 mg total) by mouth daily.    . diphenoxylate-atropine (LOMOTIL) 2.5-0.025 MG tablet Take 1 tablet 4 (four) times daily as needed by mouth for diarrhea or loose stools. 120 tablet 0  . escitalopram (LEXAPRO) 5 MG tablet Take 5 mg by mouth daily.    . furosemide (LASIX) 20 MG tablet Take 1 tablet by mouth daily as needed. For EDEMA  3  . lidocaine-prilocaine (EMLA) cream APPLY TO AFFECTED AREA ONCE  3  . mirtazapine (REMERON) 7.5 MG tablet Take 7.5 mg by mouth at bedtime.    . Multiple Vitamins-Minerals (MULTIVITAMIN ADULT PO) Take by mouth.    Marland Kitchen omeprazole (PRILOSEC) 20 MG capsule Take 1 capsule (20 mg total) by mouth daily. 90 capsule 2  . ondansetron (ZOFRAN) 8 MG tablet TAKE 1 TABLET TWO TIMES DAILY AS NEEDED FOR REFRACTORY NAUSEA OR VOMITING.  120 tablet 0  . potassium chloride SA (K-DUR,KLOR-CON) 20 MEQ tablet Take 20 mEq 2 (two) times daily by mouth.    . prochlorperazine (COMPAZINE) 10 MG tablet Take 1 tablet (10 mg total) by mouth every 6 (six) hours as needed (Nausea or vomiting). 60 tablet 2  . HYDROcodone-acetaminophen (NORCO) 5-325 MG tablet Take 1 tablet by mouth every 4 (four) hours as needed for moderate pain. 30 tablet 0   No current facility-administered medications for this visit.    Facility-Administered Medications Ordered in Other Visits  Medication Dose Route Frequency Provider Last  Rate Last Dose  . heparin lock flush 100 unit/mL  500 Units Intravenous Once [name], MD      . sodium chloride flush (NS) 0.9 % injection 10 mL  10 mL Intravenous Once [name], MD        OBJECTIVE: Vitals:   10/23/17 1020  SpO2: 97%     There is no height or weight on file to calculate BMI.    ECOG FS:1 - Symptomatic but completely ambulatory  General: Well-developed, well-nourished, no acute distress. Eyes: Pink conjunctiva, anicteric sclera. HEENT: Normocephalic, moist mucous membranes. Lungs: Clear to auscultation bilaterally. Heart: Irregularly irregular. Abdomen: Soft, nontender, nondistended. No organomegaly noted, normoactive bowel sounds. Musculoskeletal: No edema, cyanosis, or clubbing. Neuro: Alert, answering all questions appropriately. Cranial nerves grossly intact. Skin: No rashes or petechiae noted. Psych: Normal affect.  LAB RESULTS:  Lab Results  Component Value Date   NA 135 10/23/2017   K 4.3 10/23/2017  CL 102 10/23/2017   CO2 22 10/23/2017   GLUCOSE 109 (H) 10/23/2017   BUN 15 10/23/2017   CREATININE 1.05 (H) 10/23/2017   CALCIUM 9.0 10/23/2017   PROT 6.9 10/23/2017   ALBUMIN 3.1 (L) 10/23/2017   AST 15 10/23/2017   ALT 8 10/23/2017   ALKPHOS 107 10/23/2017   BILITOT 0.4 10/23/2017   GFRNONAA 48 (L) 10/23/2017   GFRAA 55 (L) 10/23/2017    Lab Results  Component Value Date   WBC 10.6 10/23/2017   NEUTROABS 8.6 (H) 10/23/2017   HGB 9.8 (L) 10/23/2017   HCT 29.9 (L) 10/23/2017   MCV 76.2 (L) 10/23/2017   PLT 333 10/23/2017     STUDIES: Dg Chest 2 View  Result Date: 09/27/2017 CLINICAL DATA:  Shortness of breath and chest pain. History of lung carcinoma. EXAM: CHEST - 2 VIEW COMPARISON:  PET-CT Jul 26, 2017; chest radiograph January 15, 2017 FINDINGS: Port-A-Cath tip is at the junction of the left innominate vein and superior vena cava. No pneumothorax. There has been interval clearing of a lesion arising in the left  upper lobe, invading the pleura. No lesion is seen in the left upper quadrant region currently. There is patchy opacity in the left inferior lingula and lower lobe in an area of prior opacity seen on the PET study. A well-defined mass is not seen on this study. No evident edema or consolidation. There are small pleural effusions bilaterally. The heart size is upper normal with pulmonary vascularity within normal limits. There is aortic atherosclerosis. No blastic or lytic bone lesions are evident by radiography. IMPRESSION: Ill-defined opacity in the inferior lingula which could represent scarring or infiltrate. Well-defined mass not seen on this study. Small pleural effusions evident. Heart size upper normal. No adenopathy. There is aortic atherosclerosis. No bone lesions are appreciable. Port-A-Cath tip at junction of left innominate vein and superior vena cava without pneumothorax. Aortic Atherosclerosis (ICD10-I70.0). Electronically Signed   By: Lowella Grip III M.D.   On: 09/27/2017 11:12   Ct Angio Chest Pe W/cm &/or Wo Cm  Result Date: 09/27/2017 CLINICAL DATA:  Shortness of breath and pain. History of lung carcinoma EXAM: CT ANGIOGRAPHY CHEST WITH CONTRAST TECHNIQUE: Multidetector CT imaging of the chest was performed using the standard protocol during bolus administration of intravenous contrast. Multiplanar CT image reconstructions and MIPs were obtained to evaluate the vascular anatomy. CONTRAST:  61m OMNIPAQUE IOHEXOL 350 MG/ML SOLN COMPARISON:  PET-CT Jul 26, 2017; chest radiograph September 27, 2017 FINDINGS: Cardiovascular: There is no demonstrable pulmonary embolus. There is no thoracic aortic aneurysm or dissection. There is mild atherosclerotic calcification in the proximal visualized great vessels. There are foci of aortic atherosclerosis. There are foci of coronary artery calcification. There is no appreciable pericardial effusion or pericardial thickening. There is a Port-A-Cath with the tip  in the superior vena cava just beyond the junction with the left innominate vein. Mediastinum/Nodes: There is a mass arising in the left lobe of the thyroid measuring 1.9 x 1.4 cm which was also present on prior PET study. There is a lymph node anterior to the distal trachea slightly to the right of midline measuring 1.6 x 1.4 cm, essentially stable. There is extensive adenopathy between the left atrium and descending thoracic aorta measuring 4.3 x 2.5 cm. This area has increased since recent study. There is a superior left hilar lymph node measuring 1.2 x 0.8 cm, stable. No esophageal lesions are appreciable. Lungs/Pleura: There is underlying centrilobular emphysematous change. There is  partially loculated left pleural effusion, stable. There is again noted a mass arising at the level of the anterior left fourth rib causing bony destruction and pleural thickening. There is adjacent consolidation in a portion of the inferior lingula. This appears somewhat smaller compared to recent PET study. It measures approximately 3.4 x 2.2 cm. There is mild pleural thickening in the right base. No new parenchymal lung lesion identified. There is lower lobe bronchiectatic change bilaterally. No new opacity suggesting pneumonia evident. Upper Abdomen: There is atherosclerotic calcification in the upper abdominal aorta and major mesenteric vessels. Gallbladder is absent. There are bilateral adrenal adenomas based on attenuation values. Adenoma on the right measures 2.3 x 1.5 cm. Adenoma on the left measures 2.0 x 1.7 cm. Musculoskeletal: There is destruction of a portion of the anterior left fourth rib. There is sclerosis and old fracture in the left tenth rib, likely due to treated metastasis in this area. No new bony lesions are evident. Review of the MIP images confirms the above findings. IMPRESSION: 1. No demonstrable pulmonary embolus. No thoracic aortic aneurysm or dissection. There is aortic atherosclerosis. There are foci  of calcification in coronary arteries as well as great vessels and major mesenteric vessels. 2. There is again noted a mass arising in the chest wall on the left anteriorly with adjacent opacity in the inferior lingula. There is partial destruction of the anterior left fourth rib, stable. This lesion overall is slightly smaller compared to most recent PET study. 3. Partially loculated left pleural effusion, stable. Pleural thickening posterior right base. 4. Underlying emphysematous change with bibasilar atelectasis. No findings felt to represent acute pneumonia. 5. Area of adenopathy between the left atrium and descending aorta, larger than on previous study. Stable pretracheal lymph node and left hilar lymph node. 6. **An incidental finding of potential clinical significance has been found. Dominant mass left lobe thyroid. Consider further evaluation with thyroid ultrasound. If patient is clinically hyperthyroid, consider nuclear medicine thyroid uptake and scan.** 7.  Bilateral adrenal adenomas. 8. Sclerosis with prior fracture lateral left tenth rib. Suspect treated metastasis. Aortic Atherosclerosis (ICD10-I70.0) and Emphysema (ICD10-J43.9). Electronically Signed   By: Lowella Grip III M.D.   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  PLAN:    1. 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  CT scan while in the ER on September 27, 2017 revealed mild improvement of disease.  She has now completed her XRT. Given the multiple problems she previously had with chemotherapy treatment, including declining performance status, this was discontinued and proceeded with second line Keytruda.  Patient will receive this every 3 weeks until intolerable side effects or progression of disease.  Proceed with cycle  13 of Keytruda today.  Return to clinic in 3 weeks for further evaluation and consideration of cycle 14.   2.  Headaches/neck pain: Patient does not complain of this today.  MRI of the brain on August 31, 2017 did not reveal any metastatic disease.   3. Pain: Improved with XRT.  Patient does not complain of this today.  Continue current narcotic regimen. 4.  Anemia: Patient hemoglobin is decreased at 9.8, but relatively stable.  Monitor. 5.  Weakness fatigue/shortness of breath: Likely secondary to atrial fibrillation.  Patient has follow-up later today with cardiology.  Patient expressed understanding and was in agreement with this plan. She also understands that She can call clinic at any time with any questions, concerns, or complaints.   Cancer Staging Squamous cell carcinoma lung, left ([place]) Staging form: Lung, AJCC 8th Edition - Clinical stage from 12/13/2016: Stage IVA (cT4, cN0, cM1a) - Signed by [name], MD on 12/13/2016   [name], MD   10/26/2017 1:49 PM

## 2017-10-23 ENCOUNTER — Inpatient Hospital Stay: Payer: Medicare HMO | Attending: Oncology

## 2017-10-23 ENCOUNTER — Ambulatory Visit
Admission: RE | Admit: 2017-10-23 | Discharge: 2017-10-23 | Disposition: A | Discharge status: Home / Self Care | Payer: Medicare HMO | Source: Ambulatory Visit | Attending: Radiation Oncology | Admitting: Radiation Oncology

## 2017-10-23 ENCOUNTER — Other Ambulatory Visit: Payer: Self-pay

## 2017-10-23 ENCOUNTER — Inpatient Hospital Stay: Payer: Medicare HMO

## 2017-10-23 ENCOUNTER — Inpatient Hospital Stay (HOSPITAL_BASED_OUTPATIENT_CLINIC_OR_DEPARTMENT_OTHER): Payer: Medicare HMO | Admitting: Oncology

## 2017-10-23 VITALS — BP 122/78 | HR 115 | Temp 98.6°F | Resp 20 | Ht 63.0 in | Wt 155.2 lb

## 2017-10-23 DIAGNOSIS — R531 Weakness: Secondary | ICD-10-CM | POA: Diagnosis not present

## 2017-10-23 DIAGNOSIS — Z79899 Other long term (current) drug therapy: Secondary | ICD-10-CM | POA: Diagnosis not present

## 2017-10-23 DIAGNOSIS — C3492 Malignant neoplasm of unspecified part of left bronchus or lung: Secondary | ICD-10-CM | POA: Insufficient documentation

## 2017-10-23 DIAGNOSIS — R0602 Shortness of breath: Secondary | ICD-10-CM | POA: Diagnosis not present

## 2017-10-23 DIAGNOSIS — Z7901 Long term (current) use of anticoagulants: Secondary | ICD-10-CM

## 2017-10-23 DIAGNOSIS — C3490 Malignant neoplasm of unspecified part of unspecified bronchus or lung: Secondary | ICD-10-CM | POA: Insufficient documentation

## 2017-10-23 DIAGNOSIS — D649 Anemia, unspecified: Secondary | ICD-10-CM | POA: Diagnosis not present

## 2017-10-23 DIAGNOSIS — R530 Neoplastic (malignant) related fatigue: Secondary | ICD-10-CM | POA: Diagnosis not present

## 2017-10-23 DIAGNOSIS — Z87891 Personal history of nicotine dependence: Secondary | ICD-10-CM | POA: Diagnosis not present

## 2017-10-23 DIAGNOSIS — Z923 Personal history of irradiation: Secondary | ICD-10-CM | POA: Diagnosis not present

## 2017-10-23 DIAGNOSIS — Z08 Encounter for follow-up examination after completed treatment for malignant neoplasm: Secondary | ICD-10-CM | POA: Insufficient documentation

## 2017-10-23 DIAGNOSIS — Z5112 Encounter for antineoplastic immunotherapy: Secondary | ICD-10-CM | POA: Insufficient documentation

## 2017-10-23 DIAGNOSIS — I480 Paroxysmal atrial fibrillation: Secondary | ICD-10-CM

## 2017-10-23 DIAGNOSIS — Z8249 Family history of ischemic heart disease and other diseases of the circulatory system: Secondary | ICD-10-CM | POA: Diagnosis not present

## 2017-10-23 LAB — COMPREHENSIVE METABOLIC PANEL
ALT: 8 U/L (ref 0–44)
ANION GAP: 11 (ref 5–15)
AST: 15 U/L (ref 15–41)
Albumin: 3.1 g/dL — ABNORMAL LOW (ref 3.5–5.0)
Alkaline Phosphatase: 107 U/L (ref 38–126)
BUN: 15 mg/dL (ref 8–23)
CALCIUM: 9 mg/dL (ref 8.9–10.3)
CHLORIDE: 102 mmol/L (ref 98–111)
CO2: 22 mmol/L (ref 22–32)
CREATININE: 1.05 mg/dL — AB (ref 0.44–1.00)
GFR, EST AFRICAN AMERICAN: 55 mL/min — AB (ref >60)
GFR, EST NON AFRICAN AMERICAN: 48 mL/min — AB (ref >60)
Glucose, Bld: 109 mg/dL — ABNORMAL HIGH (ref 70–99)
Potassium: 4.3 mmol/L (ref 3.5–5.1)
SODIUM: 135 mmol/L (ref 135–145)
Total Bilirubin: 0.4 mg/dL (ref 0.3–1.2)
Total Protein: 6.9 g/dL (ref 6.5–8.1)

## 2017-10-23 LAB — CBC WITH DIFFERENTIAL/PLATELET
BASOS PCT: 1 %
Basophils Absolute: 0.1 10*3/uL (ref 0–0.1)
EOS ABS: 0.2 10*3/uL (ref 0–0.7)
Eosinophils Relative: 2 %
HCT: 29.9 % — ABNORMAL LOW (ref 35.0–47.0)
HEMOGLOBIN: 9.8 g/dL — AB (ref 12.0–16.0)
LYMPHS ABS: 0.8 10*3/uL — AB (ref 1.0–3.6)
Lymphocytes Relative: 8 %
MCH: 24.9 pg — ABNORMAL LOW (ref 26.0–34.0)
MCHC: 32.7 g/dL (ref 32.0–36.0)
MCV: 76.2 fL — ABNORMAL LOW (ref 80.0–100.0)
Monocytes Absolute: 0.9 10*3/uL (ref 0.2–0.9)
Monocytes Relative: 9 %
NEUTROS ABS: 8.6 10*3/uL — AB (ref 1.4–6.5)
NEUTROS PCT: 80 %
Platelets: 333 10*3/uL (ref 150–440)
RBC: 3.93 MIL/uL (ref 3.80–5.20)
RDW: 15.4 % — ABNORMAL HIGH (ref 11.5–14.5)
WBC: 10.6 10*3/uL (ref 3.6–11.0)

## 2017-10-23 MED ORDER — SODIUM CHLORIDE 0.9 % IV SOLN
200.0000 mg | Freq: Once | INTRAVENOUS | Status: AC
  Administered 2017-10-23: 200 mg via INTRAVENOUS
  Filled 2017-10-23: qty 8

## 2017-10-23 MED ORDER — SODIUM CHLORIDE 0.9 % IV SOLN
Freq: Once | INTRAVENOUS | Status: AC
  Administered 2017-10-23: 12:00:00 via INTRAVENOUS
  Filled 2017-10-23: qty 1000

## 2017-10-23 MED ORDER — HEPARIN SOD (PORK) LOCK FLUSH 100 UNIT/ML IV SOLN: 500.0000 [IU] | Freq: Once | INTRAVENOUS | Status: DC

## 2017-10-23 MED ORDER — HEPARIN SOD (PORK) LOCK FLUSH 100 UNIT/ML IV SOLN
500.0000 [IU] | Freq: Once | INTRAVENOUS | Status: AC | PRN
  Administered 2017-10-23: 500 [IU]

## 2017-10-23 MED ORDER — SODIUM CHLORIDE 0.9% FLUSH
10.0000 mL | INTRAVENOUS | Status: DC | PRN
  Administered 2017-10-23: 10 mL via INTRAVENOUS
  Filled 2017-10-23: qty 10

## 2017-10-23 NOTE — Progress Notes (Signed)
Radiation Oncology Follow up Note  Name: [name]   Date:   10/23/2017 MRN:  [id number] DOB: [birth year]    This 84 y.o. female presents to the clinic today for evaluation salvage treatment to her left ribs for stage IV lung cancer REFERRING PROVIDER: Albina Billet, MD  HPI: patient is an 84 year old female who's had multiple courses of treatment for metastatic stage IV lung cancer both to her left ribs lung and recently left ribs salvage treatment. She is in no pain at this point time specifically denies any pain in the left rib area she is having some dysphasia has a large mediastinal mass which is PET positive..she is currently onmaintenance Keytruda. Her major complaint is some dysphasia as this may be related to extrinsic compression of her mediastinal adenopathy on the esophagus.  COMPLICATIONS OF TREATMENT: none  FOLLOW UP COMPLIANCE: keeps appointments   PHYSICAL EXAM:  BP 122/78 (BP Location: Left Wrist, Patient Position: Sitting, Cuff Size: Small)   Pulse (!) 115   Temp 98.6 F (37 C)   Resp 20   Ht 5\' 3"  (1.6 m)   Wt 155 lb 3.3 oz (70.4 kg)   BMI 27.49 kg/m  Frail-appearing female in NAD. Deep palpation of her left ribs does not elicit pain.Well-developed well-nourished patient in NAD. HEENT reveals PERLA, EOMI, discs not visualized.  Oral cavity is clear. No oral mucosal lesions are identified. Neck is clear without evidence of cervical or supraclavicular adenopathy. Lungs are clear to A&P. Cardiac examination is essentially unremarkable with regular rate and rhythm without murmur rub or thrill. Abdomen is benign with no organomegaly or masses noted. Motor sensory and DTR levels are equal and symmetric in the upper and lower extremities. Cranial nerves II through XII are grossly intact. Proprioception is intact. No peripheral adenopathy or edema is identified. No motor or sensory levels are noted. Crude visual fields are within normal range.  RADIOLOGY RESULTS: CT scans and  PET/CT scans reviewed and compatible above-stated findings  PLAN: at this time I'm not inclined to treat her mediastinum for palliation of her dysphasia. I would like to to continue my Keytruda at this time. Certainly if her dysphasia worsens may opt for a short palliative course of radiation therapy to her mediastinal lymph nodes. Will discuss the case personally with medical oncology. Patient continues close follow-up care and maintenance Keytruda with medical oncology.I would like to take this opportunity to thank you for allowing me to participate in the care of your patient.Noreene Filbert, MD

## 2017-10-23 NOTE — Progress Notes (Signed)
Pt in for follow up, returned from trip to [place] to visit her son.  Saw heart MD yesterday for afib issues, suppose to go back today at 2pm for holter monitor.  Pt is short of breath and very weak.

## 2017-10-23 NOTE — Progress Notes (Signed)
Patient here for follow up. She is seeing cardiologist this afternoon for A-Fib. For the past 3 weeks she has had increasing difficulty swallowing.

## 2017-10-24 LAB — THYROID PANEL WITH TSH
Free Thyroxine Index: 2.5 (ref 1.2–4.9)
T3 Uptake Ratio: 28 % (ref 24–39)
T4 TOTAL: 9 ug/dL (ref 4.5–12.0)
TSH: 0.917 u[IU]/mL (ref 0.450–4.500)

## 2017-10-25 ENCOUNTER — Other Ambulatory Visit: Payer: Self-pay | Admitting: *Deleted

## 2017-10-25 MED ORDER — HYDROCODONE-ACETAMINOPHEN 5-325 MG PO TABS: 1.0000 | ORAL_TABLET | ORAL | 0 refills | Status: DC | PRN

## 2017-11-05 ENCOUNTER — Other Ambulatory Visit: Payer: Self-pay | Admitting: Oncology

## 2017-11-11 NOTE — Progress Notes (Signed)
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  INTERVAL HISTORY: Patient returns to clinic today for further evaluation and consideration of cycle 14 of maintenance Keytruda.  She continues to have increasing weakness and fatigue and declining performance status.  She also has persistent shortness of breath which she continues to attribute to her atrial fibrillation.  She does not complain of headache or other neurologic complaints today. She has a good appetite and denies weight loss. She denies any chest pain.  She denies any nausea, vomiting, constipation, or diarrhea. She has no urinary complaints.  Patient offers no further specific complaints today.  REVIEW OF SYSTEMS:   Review of Systems  Constitutional: Positive for malaise/fatigue. Negative for fever and weight loss.  Respiratory: Positive for shortness of breath. Negative for cough and hemoptysis.   Cardiovascular: Negative.  Negative for chest pain and leg swelling.  Gastrointestinal: Negative.  Negative for abdominal pain and diarrhea.  Genitourinary: Negative.  Negative for flank pain.  Musculoskeletal: Negative.  Negative for back pain, falls and joint pain.  Skin: Negative.  Negative for itching and rash.  Neurological: Positive for weakness. Negative for sensory change, focal weakness and headaches.  Psychiatric/Behavioral: Negative.  The patient is not nervous/anxious and does not have insomnia.     As per HPI. Otherwise, a complete review of systems is negative.  PAST MEDICAL HISTORY: Past Medical History:  Diagnosis Date  . COPD (chronic obstructive pulmonary disease) ([place])   . GU (gastric peptic ulcer)   . Lung cancer ([place])   . Malignant  pleural effusion   . PAF (paroxysmal atrial fibrillation) ([place]) 10/2016   a. 10/2016 post thoracentesis ; b. 10/2016 Echo: EF 65-70%, mild LVH;  c. Amio/Eliquis initiated (CHA2DSVASc = 3).  . Pneumonia   . Wrist fracture 1993   bilateral    PAST SURGICAL HISTORY: Past Surgical History:  Procedure Laterality Date  . ABDOMINAL HYSTERECTOMY    . CATARACT EXTRACTION W/ INTRAOCULAR LENS  IMPLANT, BILATERAL    . CHEST TUBE INSERTION Left 12/18/2016   Procedure: CHEST TUBE INSERTION;  Surgeon: [name], MD;  Location: [facility] ORS;  Service: General;  Laterality: Left;  . CHOLECYSTECTOMY    . CLOSED REDUCTION WRIST FRACTURE Left 01/04/2017   Procedure: CLOSED REDUCTION WRIST;  Surgeon: Thornton Park, MD;  Location: [facility] ORS;  Service: Orthopedics;  Laterality: Left;  . PORTACATH PLACEMENT Left 12/18/2016   Procedure: INSERTION PORT-A-CATH;  Surgeon: [name], MD;  Location: [facility] ORS;  Service: General;  Laterality: Left;  . REMOVAL OF PLEURAL DRAINAGE CATHETER N/A 01/02/2017   Procedure: REMOVAL OF PLEURAL DRAINAGE CATHETER;  Surgeon: [name], MD;  Location: [facility] ORS;  Service: Thoracic;  Laterality: N/A;  . TONSILLECTOMY      FAMILY HISTORY: Family History  Problem Relation Age of Onset  . Hypertension Father   . Heart disease Father   . Heart attack Father   . Congestive Heart Failure Mother   . Stroke Mother   . Atrial fibrillation Sister     ADVANCED DIRECTIVES (Y/N):  N  HEALTH MAINTENANCE: Social History   Tobacco Use  . Smoking status: Former Smoker    Packs/day: 1.00    Years: 68.00    Pack years: 68.00  Last attempt to quit: 11/06/2016    Years since quitting: 1.0  . Smokeless tobacco: Never Used  Substance Use Topics  . Alcohol use: No  . Drug use: No     Colonoscopy:  PAP:  Bone density:  Lipid panel:  Allergies  Allergen Reactions  . Sulfa Antibiotics Other (See Comments)    Seizures   . Levaquin [Levofloxacin] Hives and Itching     Current Outpatient Medications  Medication Sig Dispense Refill  . amiodarone (PACERONE) 200 MG tablet Take 200 mg by mouth daily.  11  . apixaban (ELIQUIS) 5 MG TABS tablet Take 1 tablet (5 mg total) by mouth 2 (two) times daily. 60 tablet 0  . BIOTIN PO Take by mouth daily.    . cetirizine (ZYRTEC) 10 MG tablet Take 10 mg by mouth daily as needed for allergies.    . cholecalciferol (VITAMIN D) 1000 units tablet Take 1,000 Units by mouth daily.    Marland Kitchen diltiazem (CARDIZEM CD) 120 MG 24 hr capsule Take 1 capsule (120 mg total) by mouth daily.    . diphenoxylate-atropine (LOMOTIL) 2.5-0.025 MG tablet Take 1 tablet 4 (four) times daily as needed by mouth for diarrhea or loose stools. 120 tablet 0  . escitalopram (LEXAPRO) 5 MG tablet Take 5 mg by mouth daily.    . furosemide (LASIX) 20 MG tablet Take 1 tablet by mouth daily as needed. For EDEMA  3  . HYDROcodone-acetaminophen (NORCO) 5-325 MG tablet Take 1 tablet by mouth every 4 (four) hours as needed for moderate pain. 30 tablet 0  . lidocaine-prilocaine (EMLA) cream APPLY TO AFFECTED AREA ONCE  3  . mirtazapine (REMERON) 7.5 MG tablet Take 7.5 mg by mouth at bedtime.    . Multiple Vitamins-Minerals (MULTIVITAMIN ADULT PO) Take by mouth.    Marland Kitchen omeprazole (PRILOSEC) 20 MG capsule TAKE 1 CAPSULE EVERY DAY 90 capsule 2  . ondansetron (ZOFRAN) 8 MG tablet TAKE 1 TABLET TWO TIMES DAILY AS NEEDED FOR REFRACTORY NAUSEA OR VOMITING.  120 tablet 0  . potassium chloride SA (K-DUR,KLOR-CON) 20 MEQ tablet Take 20 mEq 2 (two) times daily by mouth.    . prochlorperazine (COMPAZINE) 10 MG tablet Take 1 tablet (10 mg total) by mouth every 6 (six) hours as needed (Nausea or vomiting). 60 tablet 2  . metoCLOPramide (REGLAN) 10 MG tablet Take 1 tablet (10 mg total) by mouth 4 (four) times daily. 120 tablet 0   No current facility-administered medications for this visit.    Facility-Administered Medications Ordered in Other Visits  Medication Dose Route  Frequency Provider Last Rate Last Dose  . heparin lock flush 100 unit/mL  500 Units Intravenous Once [name], MD      . sodium chloride flush (NS) 0.9 % injection 10 mL  10 mL Intravenous Once [name], MD        OBJECTIVE: Vitals:   11/13/17 1007  BP: 105/67  Pulse: 96  Temp: 98.5 F (36.9 C)     Body mass index is 27.55 kg/m.    ECOG FS:2 - Symptomatic, <50% confined to bed  General: Well-developed, well-nourished, no acute distress. Eyes: Pink conjunctiva, anicteric sclera. HEENT: Normocephalic, moist mucous membranes. Lungs: Clear to auscultation bilaterally. Heart: Regular rate and rhythm. No rubs, murmurs, or gallops. Abdomen: Soft, nontender, nondistended. No organomegaly noted, normoactive bowel sounds. Musculoskeletal: No edema, cyanosis, or clubbing. Neuro: Alert, answering all questions appropriately. Cranial nerves grossly intact. Skin: No rashes or petechiae noted. Psych: Normal affect.  LAB RESULTS:  Lab Results  Component Value Date   NA 138 11/13/2017   K 3.5 11/13/2017   CL 102 11/13/2017   CO2 26 11/13/2017   GLUCOSE 141 (H) 11/13/2017   BUN 13 11/13/2017   CREATININE 1.13 (H) 11/13/2017   CALCIUM 9.0 11/13/2017   PROT 6.8 11/13/2017   ALBUMIN 3.0 (L) 11/13/2017   AST 13 (L) 11/13/2017   ALT 6 11/13/2017   ALKPHOS 106 11/13/2017   BILITOT 0.5 11/13/2017   GFRNONAA 44 (L) 11/13/2017   GFRAA 51 (L) 11/13/2017    Lab Results  Component Value Date   WBC 13.4 (H) 11/13/2017   NEUTROABS 10.9 (H) 11/13/2017   HGB 9.6 (L) 11/13/2017   HCT 29.3 (L) 11/13/2017   MCV 74.5 (L) 11/13/2017   PLT 356 11/13/2017     STUDIES: No results found.  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  PLAN:    1. 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   CT scan while in the ER on September 27, 2017 revealed mild improvement of disease.  She has now completed her XRT. Given the multiple problems she previously had with chemotherapy treatment, including declining performance status, this was discontinued and proceeded with second line Keytruda.  Patient will receive this every 3 weeks until intolerable side effects or progression of disease.  Given patient's declining performance status, will repeat PET scan in the next 1 to 2 weeks.  Proceed with cycle 14 of Keytruda today.  Return to clinic in 3 weeks for further evaluation, discussion of her imaging results, and consideration of cycle 15 if there is no progression seen on PET scan.     2.  Headaches/neck pain: Patient does not complain of this today.  MRI of the brain on August 31, 2017 did not reveal any metastatic disease.   3. Pain: Improved with XRT.  Patient does not complain of this today.  Continue current narcotic regimen. 4.  Anemia: Patient's hemoglobin is decreased, but stable at 9.6.  Monitor. 5.  Weakness and fatigue/shortness of breath: Possibly multifactorial including ongoing atrial fibrillation.  PET scan as above and continue follow-up with cardiology as scheduled.  Patient expressed understanding and was in agreement with this plan. She also understands that She can call clinic at any time with any questions, concerns, or complaints.   Cancer Staging Squamous cell carcinoma lung, left ([place]) Staging form: Lung, AJCC 8th Edition - Clinical stage from 12/13/2016: Stage IVA (cT4, cN0, cM1a) - Signed by [name], MD on 12/13/2016   [name], MD   11/16/2017 4:23 PM

## 2017-11-13 ENCOUNTER — Inpatient Hospital Stay (HOSPITAL_BASED_OUTPATIENT_CLINIC_OR_DEPARTMENT_OTHER): Payer: Medicare HMO | Admitting: Oncology

## 2017-11-13 ENCOUNTER — Inpatient Hospital Stay: Payer: Medicare HMO

## 2017-11-13 ENCOUNTER — Other Ambulatory Visit: Payer: Self-pay

## 2017-11-13 ENCOUNTER — Encounter: Payer: Self-pay | Admitting: Oncology

## 2017-11-13 VITALS — BP 105/67 | HR 96 | Temp 98.5°F | Ht 63.0 in | Wt 155.5 lb

## 2017-11-13 DIAGNOSIS — C3492 Malignant neoplasm of unspecified part of left bronchus or lung: Secondary | ICD-10-CM

## 2017-11-13 DIAGNOSIS — Z7901 Long term (current) use of anticoagulants: Secondary | ICD-10-CM

## 2017-11-13 DIAGNOSIS — Z923 Personal history of irradiation: Secondary | ICD-10-CM

## 2017-11-13 DIAGNOSIS — R0602 Shortness of breath: Secondary | ICD-10-CM

## 2017-11-13 DIAGNOSIS — R530 Neoplastic (malignant) related fatigue: Secondary | ICD-10-CM

## 2017-11-13 DIAGNOSIS — Z87891 Personal history of nicotine dependence: Secondary | ICD-10-CM

## 2017-11-13 DIAGNOSIS — R531 Weakness: Secondary | ICD-10-CM | POA: Diagnosis not present

## 2017-11-13 DIAGNOSIS — I480 Paroxysmal atrial fibrillation: Secondary | ICD-10-CM

## 2017-11-13 DIAGNOSIS — Z79899 Other long term (current) drug therapy: Secondary | ICD-10-CM

## 2017-11-13 DIAGNOSIS — Z5112 Encounter for antineoplastic immunotherapy: Secondary | ICD-10-CM | POA: Diagnosis not present

## 2017-11-13 DIAGNOSIS — D649 Anemia, unspecified: Secondary | ICD-10-CM

## 2017-11-13 DIAGNOSIS — Z8249 Family history of ischemic heart disease and other diseases of the circulatory system: Secondary | ICD-10-CM

## 2017-11-13 LAB — CBC WITH DIFFERENTIAL/PLATELET
BASOS PCT: 1 %
Basophils Absolute: 0.1 10*3/uL (ref 0–0.1)
EOS ABS: 0.3 10*3/uL (ref 0–0.7)
Eosinophils Relative: 2 %
HCT: 29.3 % — ABNORMAL LOW (ref 35.0–47.0)
Hemoglobin: 9.6 g/dL — ABNORMAL LOW (ref 12.0–16.0)
LYMPHS ABS: 1.2 10*3/uL (ref 1.0–3.6)
Lymphocytes Relative: 9 %
MCH: 24.3 pg — ABNORMAL LOW (ref 26.0–34.0)
MCHC: 32.7 g/dL (ref 32.0–36.0)
MCV: 74.5 fL — ABNORMAL LOW (ref 80.0–100.0)
MONO ABS: 1 10*3/uL — AB (ref 0.2–0.9)
MONOS PCT: 8 %
Neutro Abs: 10.9 10*3/uL — ABNORMAL HIGH (ref 1.4–6.5)
Neutrophils Relative %: 80 %
Platelets: 356 10*3/uL (ref 150–440)
RBC: 3.93 MIL/uL (ref 3.80–5.20)
RDW: 15.2 % — AB (ref 11.5–14.5)
WBC: 13.4 10*3/uL — ABNORMAL HIGH (ref 3.6–11.0)

## 2017-11-13 LAB — COMPREHENSIVE METABOLIC PANEL
ALK PHOS: 106 U/L (ref 38–126)
ALT: 6 U/L (ref 0–44)
AST: 13 U/L — AB (ref 15–41)
Albumin: 3 g/dL — ABNORMAL LOW (ref 3.5–5.0)
Anion gap: 10 (ref 5–15)
BUN: 13 mg/dL (ref 8–23)
CALCIUM: 9 mg/dL (ref 8.9–10.3)
CHLORIDE: 102 mmol/L (ref 98–111)
CO2: 26 mmol/L (ref 22–32)
Creatinine, Ser: 1.13 mg/dL — ABNORMAL HIGH (ref 0.44–1.00)
GFR calc non Af Amer: 44 mL/min — ABNORMAL LOW (ref >60)
GFR, EST AFRICAN AMERICAN: 51 mL/min — AB (ref >60)
Glucose, Bld: 141 mg/dL — ABNORMAL HIGH (ref 70–99)
Potassium: 3.5 mmol/L (ref 3.5–5.1)
SODIUM: 138 mmol/L (ref 135–145)
Total Bilirubin: 0.5 mg/dL (ref 0.3–1.2)
Total Protein: 6.8 g/dL (ref 6.5–8.1)

## 2017-11-13 MED ORDER — HEPARIN SOD (PORK) LOCK FLUSH 100 UNIT/ML IV SOLN: 500.0000 [IU] | Freq: Once | INTRAVENOUS | Status: DC | PRN

## 2017-11-13 MED ORDER — SODIUM CHLORIDE 0.9 % IV SOLN
200.0000 mg | Freq: Once | INTRAVENOUS | Status: AC
  Administered 2017-11-13: 200 mg via INTRAVENOUS
  Filled 2017-11-13: qty 8

## 2017-11-13 MED ORDER — SODIUM CHLORIDE 0.9 % IV SOLN
Freq: Once | INTRAVENOUS | Status: AC
  Administered 2017-11-13: 11:00:00 via INTRAVENOUS
  Filled 2017-11-13: qty 250

## 2017-11-13 MED ORDER — SODIUM CHLORIDE 0.9% FLUSH
10.0000 mL | INTRAVENOUS | Status: DC | PRN
  Administered 2017-11-13: 10 mL via INTRAVENOUS
  Filled 2017-11-13: qty 10

## 2017-11-13 MED ORDER — METOCLOPRAMIDE HCL 10 MG PO TABS: 10.0000 mg | ORAL_TABLET | Freq: Four times a day (QID) | ORAL | 0 refills | Status: DC

## 2017-11-13 MED ORDER — HEPARIN SOD (PORK) LOCK FLUSH 100 UNIT/ML IV SOLN: 500.0000 [IU] | Freq: Once | INTRAVENOUS | Status: DC

## 2017-11-13 NOTE — Progress Notes (Signed)
Patient here for pre treatment check. She has had her A-Fib medication adjusted.

## 2017-11-14 LAB — THYROID PANEL WITH TSH
Free Thyroxine Index: 2.8 (ref 1.2–4.9)
T3 UPTAKE RATIO: 29 % (ref 24–39)
T4 TOTAL: 9.7 ug/dL (ref 4.5–12.0)
TSH: 2.4 u[IU]/mL (ref 0.450–4.500)

## 2017-11-21 ENCOUNTER — Encounter
Admission: RE | Admit: 2017-11-21 | Discharge: 2017-11-21 | Disposition: A | Discharge status: Home / Self Care | Payer: Medicare HMO | Source: Ambulatory Visit | Attending: Oncology | Admitting: Oncology

## 2017-11-21 DIAGNOSIS — C3492 Malignant neoplasm of unspecified part of left bronchus or lung: Secondary | ICD-10-CM | POA: Diagnosis not present

## 2017-11-21 LAB — GLUCOSE, CAPILLARY: Glucose-Capillary: 99 mg/dL (ref 70–99)

## 2017-11-21 MED ORDER — FLUDEOXYGLUCOSE F - 18 (FDG) INJECTION
8.0400 | Freq: Once | INTRAVENOUS | Status: AC | PRN
  Administered 2017-11-21: 8.04 via INTRAVENOUS

## 2017-11-23 ENCOUNTER — Other Ambulatory Visit: Payer: Self-pay | Admitting: *Deleted

## 2017-11-23 MED ORDER — HYDROCODONE-ACETAMINOPHEN 5-325 MG PO TABS: 1.0000 | ORAL_TABLET | ORAL | 0 refills | Status: DC | PRN

## 2017-11-23 NOTE — Telephone Encounter (Signed)
Asking for results of PET scan, has follow up appointment Tuesday  IMPRESSION: 1. Mixed but generally progressive/worsening appearance. Although several lesions are smaller and/or mildly less hypermetabolic, there is a new large right adrenal metastatic lesion; a new central pancreatic body metastatic lesion; progression of the left adrenal metastatic lesion; and increase in size and activity of the dominant central necrotic left lower paraesophageal mass. The left infrahilar nodule has also reappeared and is mildly hypermetabolic. 2. Low-grade activity along the small complex left pleural effusion, malignant effusion is a distinct possibility. 3. Roughly similar bilateral external iliac and inguinal hypermetabolic adenopathy. 4. The destructive lesion of the left fourth rib is notably smaller than on the prior exam, although has slightly higher metabolic activity. No new skeletal hypermetabolic activity is observed. 5. Other imaging findings of potential clinical significance: Chronic right maxillary sinusitis. Aortic Atherosclerosis (ICD10-I70.0). Stable mild mesenteric stranding.   Electronically Signed   By: Van Clines M.D.   On: 11/22/2017 07:40

## 2017-12-01 NOTE — Progress Notes (Signed)
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  INTERVAL HISTORY: Patient returns to clinic today for further evaluation, discussion of her imaging results, and treatment planning.  She continues to have increased weakness and fatigue and declining performance status.  She also reports difficulty swallowing, particularly solid foods.  She also has persistent shortness of breath which she continues to attribute to her atrial fibrillation.  She does not complain of headache or other neurologic complaints today.  She has a poor appetite.  She denies any chest pain.  She denies any nausea, vomiting, constipation, or diarrhea. She has no urinary complaints.  Patient offers no further specific complaints.  REVIEW OF SYSTEMS:   Review of Systems  Constitutional: Positive for malaise/fatigue. Negative for fever and weight loss.  HENT: Negative for sore throat.   Respiratory: Positive for shortness of breath. Negative for cough and hemoptysis.   Cardiovascular: Negative.  Negative for chest pain and leg swelling.  Gastrointestinal: Negative.  Negative for abdominal pain and diarrhea.  Genitourinary: Negative.  Negative for flank pain.  Musculoskeletal: Negative.  Negative for back pain, falls and joint pain.  Skin: Negative.  Negative for itching and rash.  Neurological: Positive for weakness. Negative for sensory change, focal weakness and headaches.  Psychiatric/Behavioral: Negative.  The patient is not nervous/anxious and does not have insomnia.     As per HPI. Otherwise, a complete review of systems is negative.  PAST MEDICAL HISTORY: Past Medical History:  Diagnosis Date  . COPD (chronic obstructive pulmonary  disease) ([place])   . GU (gastric peptic ulcer)   . Lung cancer ([place])   . Malignant pleural effusion   . PAF (paroxysmal atrial fibrillation) ([place]) 10/2016   a. 10/2016 post thoracentesis ; b. 10/2016 Echo: EF 65-70%, mild LVH;  c. Amio/Eliquis initiated (CHA2DSVASc = 3).  . Pneumonia   . Wrist fracture 1993   bilateral    PAST SURGICAL HISTORY: Past Surgical History:  Procedure Laterality Date  . ABDOMINAL HYSTERECTOMY    . CATARACT EXTRACTION W/ INTRAOCULAR LENS  IMPLANT, BILATERAL    . CHEST TUBE INSERTION Left 12/18/2016   Procedure: CHEST TUBE INSERTION;  Surgeon: [name], MD;  Location: [facility] ORS;  Service: General;  Laterality: Left;  . CHOLECYSTECTOMY    . CLOSED REDUCTION WRIST FRACTURE Left 01/04/2017   Procedure: CLOSED REDUCTION WRIST;  Surgeon: Thornton Park, MD;  Location: [facility] ORS;  Service: Orthopedics;  Laterality: Left;  . PORTACATH PLACEMENT Left 12/18/2016   Procedure: INSERTION PORT-A-CATH;  Surgeon: [name], MD;  Location: [facility] ORS;  Service: General;  Laterality: Left;  . REMOVAL OF PLEURAL DRAINAGE CATHETER N/A 01/02/2017   Procedure: REMOVAL OF PLEURAL DRAINAGE CATHETER;  Surgeon: [name], MD;  Location: [facility] ORS;  Service: Thoracic;  Laterality: N/A;  . TONSILLECTOMY      FAMILY HISTORY: Family History  Problem Relation Age of Onset  . Hypertension Father   . Heart disease Father   . Heart attack Father   . Congestive Heart Failure Mother   . Stroke Mother   . Atrial fibrillation Sister     ADVANCED DIRECTIVES (Y/N):  N  HEALTH MAINTENANCE: Social History   Tobacco Use  . Smoking status: Former Smoker    Packs/day: 1.00  Years: 68.00    Pack years: 68.00    Last attempt to quit: 11/06/2016    Years since quitting: 1.0  . Smokeless tobacco: Never Used  Substance Use Topics  . Alcohol use: No  . Drug use: No     Colonoscopy:  PAP:  Bone density:  Lipid panel:  Allergies  Allergen Reactions  . Sulfa Antibiotics Other  (See Comments)    Seizures   . Levaquin [Levofloxacin] Hives and Itching    Current Outpatient Medications  Medication Sig Dispense Refill  . amiodarone (PACERONE) 200 MG tablet Take 200 mg by mouth daily.  11  . apixaban (ELIQUIS) 5 MG TABS tablet Take 1 tablet (5 mg total) by mouth 2 (two) times daily. 60 tablet 0  . BIOTIN PO Take by mouth daily.    . cetirizine (ZYRTEC) 10 MG tablet Take 10 mg by mouth daily as needed for allergies.    . cholecalciferol (VITAMIN D) 1000 units tablet Take 1,000 Units by mouth daily.    Marland Kitchen diltiazem (CARDIZEM CD) 120 MG 24 hr capsule Take 1 capsule (120 mg total) by mouth daily.    . diphenoxylate-atropine (LOMOTIL) 2.5-0.025 MG tablet Take 1 tablet 4 (four) times daily as needed by mouth for diarrhea or loose stools. 120 tablet 0  . escitalopram (LEXAPRO) 5 MG tablet Take 5 mg by mouth daily.    . furosemide (LASIX) 20 MG tablet Take 1 tablet by mouth daily as needed. For EDEMA  3  . HYDROcodone-acetaminophen (NORCO) 5-325 MG tablet Take 1 tablet by mouth every 4 (four) hours as needed for moderate pain. 30 tablet 0  . lidocaine-prilocaine (EMLA) cream APPLY TO AFFECTED AREA ONCE  3  . metoCLOPramide (REGLAN) 10 MG tablet Take 1 tablet (10 mg total) by mouth 4 (four) times daily. 120 tablet 0  . mirtazapine (REMERON) 7.5 MG tablet Take 7.5 mg by mouth at bedtime.    . Multiple Vitamins-Minerals (MULTIVITAMIN ADULT PO) Take by mouth.    Marland Kitchen omeprazole (PRILOSEC) 20 MG capsule TAKE 1 CAPSULE EVERY DAY 90 capsule 2  . ondansetron (ZOFRAN) 8 MG tablet TAKE 1 TABLET TWO TIMES DAILY AS NEEDED FOR REFRACTORY NAUSEA OR VOMITING.  120 tablet 0  . potassium chloride SA (K-DUR,KLOR-CON) 20 MEQ tablet Take 20 mEq 2 (two) times daily by mouth.    . prochlorperazine (COMPAZINE) 10 MG tablet Take 1 tablet (10 mg total) by mouth every 6 (six) hours as needed (Nausea or vomiting). 60 tablet 2   No current facility-administered medications for this visit.     Facility-Administered Medications Ordered in Other Visits  Medication Dose Route Frequency Provider Last Rate Last Dose  . heparin lock flush 100 unit/mL  500 Units Intravenous Once [name], MD      . heparin lock flush 100 unit/mL  500 Units Intracatheter Once PRN [name], MD      . sodium chloride flush (NS) 0.9 % injection 10 mL  10 mL Intravenous Once [name], MD        OBJECTIVE: Vitals:   12/04/17 1059  BP: 114/77  Pulse: (!) 101  Resp: 20  Temp: (!) 97.5 F (36.4 C)     Body mass index is 26.47 kg/m.    ECOG FS:2 - Symptomatic, <50% confined to bed  General: Well-developed, well-nourished, no acute distress. Eyes: Pink conjunctiva, anicteric sclera. HEENT: Normocephalic, moist mucous membranes. Lungs: Clear to auscultation bilaterally. Heart: Regular rate and rhythm. No rubs, murmurs, or  gallops. Abdomen: Soft, nontender, nondistended. No organomegaly noted, normoactive bowel sounds. Musculoskeletal: No edema, cyanosis, or clubbing. Neuro: Alert, answering all questions appropriately. Cranial nerves grossly intact. Skin: No rashes or petechiae noted. Psych: Normal affect.  LAB RESULTS:  Lab Results  Component Value Date   NA 138 12/04/2017   K 3.8 12/04/2017   CL 102 12/04/2017   CO2 24 12/04/2017   GLUCOSE 137 (H) 12/04/2017   BUN 12 12/04/2017   CREATININE 1.13 (H) 12/04/2017   CALCIUM 9.0 12/04/2017   PROT 6.7 12/04/2017   ALBUMIN 3.0 (L) 12/04/2017   AST 12 (L) 12/04/2017   ALT 6 12/04/2017   ALKPHOS 101 12/04/2017   BILITOT 0.7 12/04/2017   GFRNONAA 44 (L) 12/04/2017   GFRAA 51 (L) 12/04/2017    Lab Results  Component Value Date   WBC 15.0 (H) 12/04/2017   NEUTROABS 11.8 (H) 12/04/2017   HGB 9.4 (L) 12/04/2017   HCT 29.1 (L) 12/04/2017   MCV 74.3 (L) 12/04/2017   PLT 450 (H) 12/04/2017     STUDIES: Nm Pet Image Restag (ps) Skull Base To Thigh  Result Date: 11/22/2017 CLINICAL DATA:  Subsequent  treatment strategy for lung cancer, immunotherapy. EXAM: NUCLEAR MEDICINE PET SKULL BASE TO THIGH TECHNIQUE: 8.0 mCi F-18 FDG was injected intravenously. Full-ring PET imaging was performed from the skull base to thigh after the radiotracer. CT data was obtained and used for attenuation correction and anatomic localization. Fasting blood glucose: 99 mg/dl COMPARISON:  Multiple exams, including 07/26/2017 FINDINGS: Mediastinal blood pool activity: SUV max 2.3 NECK: Low-grade metabolic activity associated with the 1.9 by 1.4 cm left thyroid nodule, maximum SUV 3.7. Incidental CT findings: Chronic right maxillary sinusitis. Bilateral common carotid atherosclerotic calcifications. CHEST: Centrally necrotic left paraesophageal mass measures approximately 6.5 by 2.8 cm (formerly 4.9 by 2 point 3 cm) and has a maximum SUV of 25.1 (formerly 21.6). Destructive lesion of the left anterior fourth rib appears improved from prior PET-CT, currently measuring 3.4 by 1.9 cm on image 107/3 (formerly 4.4 by 3.7 cm) with maximum SUV 8.4 (formerly 7.7). A left infrahilar nodule is indistinctly marginated but has a maximum SUV of 6.1. This tracks along bronchovascular structures. This nodule was present and hypermetabolic in February but had resolved on the May 2019 exam; clearly the site has recurred. A left axillary lymph node measuring 1.0 cm in short axis on image 75/3 (formerly 0.9 cm) has a maximum SUV of 3.0 (formerly 2.3). There is low-grade activity along a small left pleural effusion with loculated components, malignant effusion is a distinct possibility. Incidental CT findings: Biapical pleuroparenchymal scarring. There is some scattered atelectasis in the left lower lobe along with airway thickening in the lower lobes, left greater than right. Left Port-A-Cath tip: Brachiocephalic vein near the SVC confluence. ABDOMEN/PELVIS: Enlarging right adrenal mass compatible with metastatic lesions superimposed on the original adrenal  adenoma, measuring 3.6 by 3.2 cm with maximum SUV 14.8. There is also a left adrenal collision lesion with pre-existing adenoma and increasingly hypermetabolic metastatic focus. The metastatic focus measures about 9 mm in diameter There is a new abnormal focus of hypermetabolic activity centrally in the pancreatic body favoring metastatic lesion and corresponding to subtle new contour convexity. This lesion measures about 1.8 cm in diameter and has a maximum SUV of 6.5. Focus of accentuated activity in the cecum without definite CT correlate, maximum SUV 10.9, previous maximum SUV in this vicinity was 4.6. Most likely to be physiologic. Mildly hypermetabolic bilateral external iliac and  inguinal adenopathy. An index right external iliac node measuring 1.5 cm in short axis on image 227/3 (formerly 1.2 cm) has a maximum SUV of 6.0 (formerly 6.4). Incidental CT findings: Cholecystectomy. Aortoiliac atherosclerotic vascular disease. Left renal hilar parapelvic cyst. Stable stranding in the mesentery. SKELETON: Destructive lesion of the left anterior fourth rib is noted previously. No significant abnormal uptake at the previously treated left tenth rib lesions site, which demonstrates segmental sclerosis. Incidental CT findings: none IMPRESSION: 1. Mixed but generally progressive/worsening appearance. Although several lesions are smaller and/or mildly less hypermetabolic, there is a new large right adrenal metastatic lesion; a new central pancreatic body metastatic lesion; progression of the left adrenal metastatic lesion; and increase in size and activity of the dominant central necrotic left lower paraesophageal mass. The left infrahilar nodule has also reappeared and is mildly hypermetabolic. 2. Low-grade activity along the small complex left pleural effusion, malignant effusion is a distinct possibility. 3. Roughly similar bilateral external iliac and inguinal hypermetabolic adenopathy. 4. The destructive lesion of  the left fourth rib is notably smaller than on the prior exam, although has slightly higher metabolic activity. No new skeletal hypermetabolic activity is observed. 5. Other imaging findings of potential clinical significance: Chronic right maxillary sinusitis. Aortic Atherosclerosis (ICD10-I70.0). Stable mild mesenteric stranding. Electronically Signed   By: Van Clines M.D.   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  PLAN:    1. 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  She is noted to have a new large right adrenal metastatic lesion as well as a new central creatinine body metastatic lesion.  Left adrenal lesion is bigger.  The dominant left lower paraesophageal mass has also increased in size.  We will discontinue Keytruda at this time.  We briefly discussed hospice and end-of-life, but patient is not ready to make this decision.  Despite her difficulty with chemotherapy previously, patient has agreed to attempt a second line therapy using carboplatin and gemcitabine.  She has refused Taxol or Taxotere.  Patient will receive carboplatin and gemcitabine on day 1 and gemcitabine only on day 8.  This will be a 21-day cycle.  Return to clinic in 1 week for consideration of cycle 1, day 1.   2.  Headaches/neck pain: Patient does not complain of this today.  MRI of the brain on August 31, 2017 did not reveal any metastatic disease.   3. Pain: Improved with XRT.  Patient does not complain of this today.  Continue current narcotic regimen. 4.  Difficulty swallowing: Possibly secondary to paraesophageal mass.  A referral was given back to radiation oncology for palliative XRT.  Chemotherapy as above.  I spent a total of 30 minutes face-to-face with the patient of which greater than 50% of the visit was spent in counseling  and coordination of care as detailed above.  Patient expressed understanding and was in agreement with this plan. She also understands that She can call clinic at any time with any questions, concerns, or complaints.   Cancer Staging Squamous cell carcinoma lung, left ([place]) Staging form: Lung, AJCC 8th Edition - Clinical stage from 12/13/2016: Stage IVA (cT4, cN0, cM1a) - Signed by [name], MD on 12/13/2016   [name], MD   12/04/2017 1:24 PM

## 2017-12-04 ENCOUNTER — Inpatient Hospital Stay (HOSPITAL_BASED_OUTPATIENT_CLINIC_OR_DEPARTMENT_OTHER): Payer: Medicare HMO | Admitting: Oncology

## 2017-12-04 ENCOUNTER — Inpatient Hospital Stay: Payer: Medicare HMO

## 2017-12-04 ENCOUNTER — Inpatient Hospital Stay: Payer: Medicare HMO | Attending: Oncology

## 2017-12-04 ENCOUNTER — Encounter: Payer: Self-pay | Admitting: Oncology

## 2017-12-04 VITALS — BP 114/77 | HR 101 | Temp 97.5°F | Resp 20 | Wt 149.4 lb

## 2017-12-04 DIAGNOSIS — I480 Paroxysmal atrial fibrillation: Secondary | ICD-10-CM | POA: Insufficient documentation

## 2017-12-04 DIAGNOSIS — C3492 Malignant neoplasm of unspecified part of left bronchus or lung: Secondary | ICD-10-CM

## 2017-12-04 DIAGNOSIS — Z5112 Encounter for antineoplastic immunotherapy: Secondary | ICD-10-CM | POA: Diagnosis present

## 2017-12-04 DIAGNOSIS — R0602 Shortness of breath: Secondary | ICD-10-CM

## 2017-12-04 DIAGNOSIS — R079 Chest pain, unspecified: Secondary | ICD-10-CM | POA: Diagnosis not present

## 2017-12-04 DIAGNOSIS — D72829 Elevated white blood cell count, unspecified: Secondary | ICD-10-CM | POA: Insufficient documentation

## 2017-12-04 DIAGNOSIS — R530 Neoplastic (malignant) related fatigue: Secondary | ICD-10-CM | POA: Insufficient documentation

## 2017-12-04 DIAGNOSIS — C7971 Secondary malignant neoplasm of right adrenal gland: Secondary | ICD-10-CM | POA: Insufficient documentation

## 2017-12-04 DIAGNOSIS — Z87891 Personal history of nicotine dependence: Secondary | ICD-10-CM

## 2017-12-04 DIAGNOSIS — R1310 Dysphagia, unspecified: Secondary | ICD-10-CM | POA: Diagnosis not present

## 2017-12-04 DIAGNOSIS — Z79899 Other long term (current) drug therapy: Secondary | ICD-10-CM | POA: Diagnosis not present

## 2017-12-04 DIAGNOSIS — Z7901 Long term (current) use of anticoagulants: Secondary | ICD-10-CM | POA: Diagnosis not present

## 2017-12-04 DIAGNOSIS — D649 Anemia, unspecified: Secondary | ICD-10-CM | POA: Diagnosis not present

## 2017-12-04 DIAGNOSIS — Z8249 Family history of ischemic heart disease and other diseases of the circulatory system: Secondary | ICD-10-CM | POA: Diagnosis not present

## 2017-12-04 DIAGNOSIS — Z5111 Encounter for antineoplastic chemotherapy: Secondary | ICD-10-CM | POA: Diagnosis present

## 2017-12-04 LAB — COMPREHENSIVE METABOLIC PANEL
ALBUMIN: 3 g/dL — AB (ref 3.5–5.0)
ALT: 6 U/L (ref 0–44)
AST: 12 U/L — ABNORMAL LOW (ref 15–41)
Alkaline Phosphatase: 101 U/L (ref 38–126)
Anion gap: 12 (ref 5–15)
BILIRUBIN TOTAL: 0.7 mg/dL (ref 0.3–1.2)
BUN: 12 mg/dL (ref 8–23)
CO2: 24 mmol/L (ref 22–32)
CREATININE: 1.13 mg/dL — AB (ref 0.44–1.00)
Calcium: 9 mg/dL (ref 8.9–10.3)
Chloride: 102 mmol/L (ref 98–111)
GFR calc Af Amer: 51 mL/min — ABNORMAL LOW (ref >60)
GFR calc non Af Amer: 44 mL/min — ABNORMAL LOW (ref >60)
GLUCOSE: 137 mg/dL — AB (ref 70–99)
Potassium: 3.8 mmol/L (ref 3.5–5.1)
SODIUM: 138 mmol/L (ref 135–145)
TOTAL PROTEIN: 6.7 g/dL (ref 6.5–8.1)

## 2017-12-04 LAB — CBC WITH DIFFERENTIAL/PLATELET
BASOS ABS: 0.1 10*3/uL (ref 0–0.1)
Basophils Relative: 1 %
EOS ABS: 0.4 10*3/uL (ref 0–0.7)
EOS PCT: 3 %
HEMATOCRIT: 29.1 % — AB (ref 35.0–47.0)
Hemoglobin: 9.4 g/dL — ABNORMAL LOW (ref 12.0–16.0)
Lymphocytes Relative: 11 %
Lymphs Abs: 1.6 10*3/uL (ref 1.0–3.6)
MCH: 23.9 pg — ABNORMAL LOW (ref 26.0–34.0)
MCHC: 32.1 g/dL (ref 32.0–36.0)
MCV: 74.3 fL — ABNORMAL LOW (ref 80.0–100.0)
MONO ABS: 1.2 10*3/uL — AB (ref 0.2–0.9)
MONOS PCT: 8 %
Neutro Abs: 11.8 10*3/uL — ABNORMAL HIGH (ref 1.4–6.5)
Neutrophils Relative %: 79 %
PLATELETS: 450 10*3/uL — AB (ref 150–440)
RBC: 3.92 MIL/uL (ref 3.80–5.20)
RDW: 16 % — AB (ref 11.5–14.5)
WBC: 15 10*3/uL — ABNORMAL HIGH (ref 3.6–11.0)

## 2017-12-04 MED ORDER — SODIUM CHLORIDE 0.9% FLUSH
10.0000 mL | Freq: Once | INTRAVENOUS | Status: AC
  Administered 2017-12-04: 10 mL via INTRAVENOUS
  Filled 2017-12-04: qty 10

## 2017-12-04 MED ORDER — PROCHLORPERAZINE MALEATE 10 MG PO TABS: 10.0000 mg | ORAL_TABLET | Freq: Four times a day (QID) | ORAL | 2 refills | Status: DC | PRN

## 2017-12-04 MED ORDER — SODIUM CHLORIDE 0.9 % IV SOLN
200.0000 mg | Freq: Once | INTRAVENOUS | Status: AC
  Administered 2017-12-04: 200 mg via INTRAVENOUS
  Filled 2017-12-04: qty 8

## 2017-12-04 MED ORDER — ONDANSETRON HCL 8 MG PO TABS: 8.0000 mg | ORAL_TABLET | Freq: Two times a day (BID) | ORAL | 2 refills | Status: DC | PRN

## 2017-12-04 MED ORDER — SODIUM CHLORIDE 0.9 % IV SOLN
Freq: Once | INTRAVENOUS | Status: AC
  Administered 2017-12-04: 11:00:00 via INTRAVENOUS
  Filled 2017-12-04: qty 250

## 2017-12-04 MED ORDER — HEPARIN SOD (PORK) LOCK FLUSH 100 UNIT/ML IV SOLN
500.0000 [IU] | Freq: Once | INTRAVENOUS | Status: AC
  Administered 2017-12-04: 500 [IU] via INTRAVENOUS
  Filled 2017-12-04: qty 5

## 2017-12-04 MED ORDER — HEPARIN SOD (PORK) LOCK FLUSH 100 UNIT/ML IV SOLN: 500.0000 [IU] | Freq: Once | INTRAVENOUS | Status: DC | PRN

## 2017-12-04 MED ORDER — SODIUM CHLORIDE 0.9 % IV SOLN
Freq: Once | INTRAVENOUS | Status: AC
  Administered 2017-12-04: 12:00:00 via INTRAVENOUS
  Filled 2017-12-04: qty 250

## 2017-12-04 NOTE — Progress Notes (Signed)
DISCONTINUE ON PATHWAY REGIMEN - Non-Small Cell Lung     A cycle is 21 days:     Pembrolizumab   **Always confirm dose/schedule in your pharmacy ordering system**  REASON: Disease Progression PRIOR TREATMENT: [id number]: Pembrolizumab 200 mg q21 Days Until Disease Progression, Unacceptable Toxicity, or up to 24 Months TREATMENT RESPONSE: Progressive Disease (PD)  START OFF PATHWAY REGIMEN - Non-Small Cell Lung   [id number]:Carboplatin + Gemcitabine (07/998) q21 Days:   A cycle is every 21 days:     Carboplatin      Gemcitabine   **Always confirm dose/schedule in your pharmacy ordering system**  Patient Characteristics: Stage IV Metastatic, Squamous, PS = 2, Third Line AJCC T Category: T4 Current Disease Status: Distant Metastases AJCC N Category: NX AJCC M Category: M1b AJCC 8 Stage Grouping: IVA Histology: Squamous Cell Line of therapy: Third Line PD-L1 Expression Status: PD-L1 Positive ? 50% (TPS) Performance Status: PS = 2  Intent of Therapy: Non-Curative / Palliative Intent, Discussed with Patient

## 2017-12-04 NOTE — Progress Notes (Signed)
Pt in for follow up and results.  "having very difficult time swallowing, unable to get medications down this morning".  Reports difficult to eat and drink.  Short of breath on exertion.

## 2017-12-05 LAB — THYROID PANEL WITH TSH
FREE THYROXINE INDEX: 4.3 (ref 1.2–4.9)
T3 Uptake Ratio: 34 % (ref 24–39)
T4 TOTAL: 12.5 ug/dL — AB (ref 4.5–12.0)
TSH: 0.882 u[IU]/mL (ref 0.450–4.500)

## 2017-12-09 NOTE — Progress Notes (Signed)
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  INTERVAL HISTORY: Patient returns to clinic today for further evaluation and consideration of cycle 1, day 1 of carboplatinum and gemcitabine.  She continues to have right sided flank/chest pain.  She also has ongoing weakness and fatigue and decreased performance status. She also reports difficulty swallowing, particularly solid foods.  She continues to have shortness of breath.  She does not complain of headache or other neurologic complaints today.  She has a poor appetite. She denies any nausea, vomiting, constipation, or diarrhea. She has no urinary complaints.  Patient offers no further specific complaints today.  REVIEW OF SYSTEMS:   Review of Systems  Constitutional: Positive for malaise/fatigue. Negative for fever and weight loss.  HENT: Negative for sore throat.   Respiratory: Positive for shortness of breath. Negative for cough and hemoptysis.   Cardiovascular: Negative.  Negative for chest pain and leg swelling.  Gastrointestinal: Negative.  Negative for abdominal pain and diarrhea.  Genitourinary: Negative.  Negative for flank pain.  Musculoskeletal: Negative.  Negative for back pain, falls and joint pain.  Skin: Negative.  Negative for itching and rash.  Neurological: Positive for weakness. Negative for sensory change, focal weakness and headaches.  Psychiatric/Behavioral: Negative.  The patient is not nervous/anxious and does not have insomnia.     As per HPI. Otherwise, a complete review of systems is negative.  PAST MEDICAL HISTORY: Past Medical History:  Diagnosis Date  . COPD (chronic obstructive pulmonary disease) ([place])   . GU (gastric peptic  ulcer)   . Lung cancer ([place])   . Malignant pleural effusion   . PAF (paroxysmal atrial fibrillation) ([place]) 10/2016   a. 10/2016 post thoracentesis ; b. 10/2016 Echo: EF 65-70%, mild LVH;  c. Amio/Eliquis initiated (CHA2DSVASc = 3).  . Pneumonia   . Wrist fracture 1993   bilateral    PAST SURGICAL HISTORY: Past Surgical History:  Procedure Laterality Date  . ABDOMINAL HYSTERECTOMY    . CATARACT EXTRACTION W/ INTRAOCULAR LENS  IMPLANT, BILATERAL    . CHEST TUBE INSERTION Left 12/18/2016   Procedure: CHEST TUBE INSERTION;  Surgeon: [name], MD;  Location: [facility] ORS;  Service: General;  Laterality: Left;  . CHOLECYSTECTOMY    . CLOSED REDUCTION WRIST FRACTURE Left 01/04/2017   Procedure: CLOSED REDUCTION WRIST;  Surgeon: Thornton Park, MD;  Location: [facility] ORS;  Service: Orthopedics;  Laterality: Left;  . PORTACATH PLACEMENT Left 12/18/2016   Procedure: INSERTION PORT-A-CATH;  Surgeon: [name], MD;  Location: [facility] ORS;  Service: General;  Laterality: Left;  . REMOVAL OF PLEURAL DRAINAGE CATHETER N/A 01/02/2017   Procedure: REMOVAL OF PLEURAL DRAINAGE CATHETER;  Surgeon: [name], MD;  Location: [facility] ORS;  Service: Thoracic;  Laterality: N/A;  . TONSILLECTOMY      FAMILY HISTORY: Family History  Problem Relation Age of Onset  . Hypertension Father   . Heart disease Father   . Heart attack Father   . Congestive Heart Failure Mother   . Stroke Mother   . Atrial fibrillation Sister     ADVANCED DIRECTIVES (Y/N):  N  HEALTH MAINTENANCE: Social History   Tobacco Use  . Smoking status: Former Smoker    Packs/day: 1.00    Years: 68.00  Pack years: 68.00    Last attempt to quit: 11/06/2016    Years since quitting: 1.1  . Smokeless tobacco: Never Used  Substance Use Topics  . Alcohol use: No  . Drug use: No     Colonoscopy:  PAP:  Bone density:  Lipid panel:  Allergies  Allergen Reactions  . Sulfa Antibiotics Other (See Comments)    Seizures   .  Levaquin [Levofloxacin] Hives and Itching    Current Outpatient Medications  Medication Sig Dispense Refill  . amiodarone (PACERONE) 200 MG tablet Take 200 mg by mouth daily.  11  . apixaban (ELIQUIS) 5 MG TABS tablet Take 1 tablet (5 mg total) by mouth 2 (two) times daily. 60 tablet 0  . BIOTIN PO Take by mouth daily.    . cetirizine (ZYRTEC) 10 MG tablet Take 10 mg by mouth daily as needed for allergies.    . cholecalciferol (VITAMIN D) 1000 units tablet Take 1,000 Units by mouth daily.    Marland Kitchen diltiazem (CARDIZEM CD) 120 MG 24 hr capsule Take 1 capsule (120 mg total) by mouth daily.    Marland Kitchen escitalopram (LEXAPRO) 5 MG tablet Take 5 mg by mouth daily.    . furosemide (LASIX) 20 MG tablet Take 1 tablet by mouth daily as needed. For EDEMA  3  . HYDROcodone-acetaminophen (NORCO) 5-325 MG tablet Take 1 tablet by mouth every 4 (four) hours as needed for moderate pain. 60 tablet 0  . lidocaine-prilocaine (EMLA) cream APPLY TO AFFECTED AREA ONCE  3  . metoCLOPramide (REGLAN) 10 MG tablet Take 1 tablet (10 mg total) by mouth 4 (four) times daily. 120 tablet 0  . mirtazapine (REMERON) 7.5 MG tablet Take 7.5 mg by mouth at bedtime.    . Multiple Vitamins-Minerals (MULTIVITAMIN ADULT PO) Take by mouth.    Marland Kitchen omeprazole (PRILOSEC) 20 MG capsule TAKE 1 CAPSULE EVERY DAY 90 capsule 2  . ondansetron (ZOFRAN) 8 MG tablet TAKE 1 TABLET TWO TIMES DAILY AS NEEDED FOR REFRACTORY NAUSEA OR VOMITING.  120 tablet 0  . ondansetron (ZOFRAN) 8 MG tablet Take 1 tablet (8 mg total) by mouth 2 (two) times daily as needed for refractory nausea / vomiting. Start on day 3 after carboplatin chemo. 30 tablet 2  . potassium chloride SA (K-DUR,KLOR-CON) 20 MEQ tablet Take 20 mEq 2 (two) times daily by mouth.    . diphenoxylate-atropine (LOMOTIL) 2.5-0.025 MG tablet Take 1 tablet 4 (four) times daily as needed by mouth for diarrhea or loose stools. (Patient not taking: Reported on 12/11/2017) 120 tablet 0  . prochlorperazine  (COMPAZINE) 10 MG tablet Take 1 tablet (10 mg total) by mouth every 6 (six) hours as needed (Nausea or vomiting). (Patient not taking: Reported on 12/11/2017) 60 tablet 2   No current facility-administered medications for this visit.    Facility-Administered Medications Ordered in Other Visits  Medication Dose Route Frequency Provider Last Rate Last Dose  . heparin lock flush 100 unit/mL  500 Units Intravenous Once [name], MD      . sodium chloride flush (NS) 0.9 % injection 10 mL  10 mL Intravenous Once [name], MD        OBJECTIVE: Vitals:   12/11/17 1304  BP: 111/69  Pulse: (!) 102  Resp: 18  Temp: (!) 96.3 F (35.7 C)     Body mass index is 26.2 kg/m.    ECOG FS:2 - Symptomatic, <50% confined to bed  General: Well-developed, well-nourished, no acute distress.  Sitting  in a wheelchair. Eyes: Pink conjunctiva, anicteric sclera. HEENT: Normocephalic, moist mucous membranes. Lungs: Clear to auscultation bilaterally. Heart: Regular rate and rhythm. No rubs, murmurs, or gallops. Abdomen: Soft, nontender, nondistended. No organomegaly noted, normoactive bowel sounds. Musculoskeletal: No edema, cyanosis, or clubbing. Neuro: Alert, answering all questions appropriately. Cranial nerves grossly intact. Skin: No rashes or petechiae noted. Psych: Normal affect.  LAB RESULTS:  Lab Results  Component Value Date   NA 134 (L) 12/11/2017   K 4.2 12/11/2017   CL 100 12/11/2017   CO2 24 12/11/2017   GLUCOSE 122 (H) 12/11/2017   BUN 13 12/11/2017   CREATININE 1.00 12/11/2017   CALCIUM 8.6 (L) 12/11/2017   PROT 6.6 12/11/2017   ALBUMIN 2.9 (L) 12/11/2017   AST 10 (L) 12/11/2017   ALT 5 12/11/2017   ALKPHOS 94 12/11/2017   BILITOT 0.4 12/11/2017   GFRNONAA 51 (L) 12/11/2017   GFRAA 59 (L) 12/11/2017    Lab Results  Component Value Date   WBC 15.3 (H) 12/11/2017   NEUTROABS 13.0 (H) 12/11/2017   HGB 9.5 (L) 12/11/2017   HCT 29.2 (L) 12/11/2017   MCV  73.5 (L) 12/11/2017   PLT 382 12/11/2017     STUDIES: Nm Pet Image Restag (ps) Skull Base To Thigh  Result Date: 11/22/2017 CLINICAL DATA:  Subsequent treatment strategy for lung cancer, immunotherapy. EXAM: NUCLEAR MEDICINE PET SKULL BASE TO THIGH TECHNIQUE: 8.0 mCi F-18 FDG was injected intravenously. Full-ring PET imaging was performed from the skull base to thigh after the radiotracer. CT data was obtained and used for attenuation correction and anatomic localization. Fasting blood glucose: 99 mg/dl COMPARISON:  Multiple exams, including 07/26/2017 FINDINGS: Mediastinal blood pool activity: SUV max 2.3 NECK: Low-grade metabolic activity associated with the 1.9 by 1.4 cm left thyroid nodule, maximum SUV 3.7. Incidental CT findings: Chronic right maxillary sinusitis. Bilateral common carotid atherosclerotic calcifications. CHEST: Centrally necrotic left paraesophageal mass measures approximately 6.5 by 2.8 cm (formerly 4.9 by 2 point 3 cm) and has a maximum SUV of 25.1 (formerly 21.6). Destructive lesion of the left anterior fourth rib appears improved from prior PET-CT, currently measuring 3.4 by 1.9 cm on image 107/3 (formerly 4.4 by 3.7 cm) with maximum SUV 8.4 (formerly 7.7). A left infrahilar nodule is indistinctly marginated but has a maximum SUV of 6.1. This tracks along bronchovascular structures. This nodule was present and hypermetabolic in February but had resolved on the May 2019 exam; clearly the site has recurred. A left axillary lymph node measuring 1.0 cm in short axis on image 75/3 (formerly 0.9 cm) has a maximum SUV of 3.0 (formerly 2.3). There is low-grade activity along a small left pleural effusion with loculated components, malignant effusion is a distinct possibility. Incidental CT findings: Biapical pleuroparenchymal scarring. There is some scattered atelectasis in the left lower lobe along with airway thickening in the lower lobes, left greater than right. Left Port-A-Cath tip:  Brachiocephalic vein near the SVC confluence. ABDOMEN/PELVIS: Enlarging right adrenal mass compatible with metastatic lesions superimposed on the original adrenal adenoma, measuring 3.6 by 3.2 cm with maximum SUV 14.8. There is also a left adrenal collision lesion with pre-existing adenoma and increasingly hypermetabolic metastatic focus. The metastatic focus measures about 9 mm in diameter There is a new abnormal focus of hypermetabolic activity centrally in the pancreatic body favoring metastatic lesion and corresponding to subtle new contour convexity. This lesion measures about 1.8 cm in diameter and has a maximum SUV of 6.5. Focus of accentuated activity in the  cecum without definite CT correlate, maximum SUV 10.9, previous maximum SUV in this vicinity was 4.6. Most likely to be physiologic. Mildly hypermetabolic bilateral external iliac and inguinal adenopathy. An index right external iliac node measuring 1.5 cm in short axis on image 227/3 (formerly 1.2 cm) has a maximum SUV of 6.0 (formerly 6.4). Incidental CT findings: Cholecystectomy. Aortoiliac atherosclerotic vascular disease. Left renal hilar parapelvic cyst. Stable stranding in the mesentery. SKELETON: Destructive lesion of the left anterior fourth rib is noted previously. No significant abnormal uptake at the previously treated left tenth rib lesions site, which demonstrates segmental sclerosis. Incidental CT findings: none IMPRESSION: 1. Mixed but generally progressive/worsening appearance. Although several lesions are smaller and/or mildly less hypermetabolic, there is a new large right adrenal metastatic lesion; a new central pancreatic body metastatic lesion; progression of the left adrenal metastatic lesion; and increase in size and activity of the dominant central necrotic left lower paraesophageal mass. The left infrahilar nodule has also reappeared and is mildly hypermetabolic. 2. Low-grade activity along the small complex left pleural  effusion, malignant effusion is a distinct possibility. 3. Roughly similar bilateral external iliac and inguinal hypermetabolic adenopathy. 4. The destructive lesion of the left fourth rib is notably smaller than on the prior exam, although has slightly higher metabolic activity. No new skeletal hypermetabolic activity is observed. 5. Other imaging findings of potential clinical significance: Chronic right maxillary sinusitis. Aortic Atherosclerosis (ICD10-I70.0). Stable mild mesenteric stranding. Electronically Signed   By: Van Clines M.D.   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  PLAN:    1. 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  She is noted to have a new large right adrenal metastatic lesion as well as a new central pancreatic body metastatic lesion.  Left adrenal lesion is bigger.  The dominant left lower paraesophageal mass has also increased in size.  Beryle Flock has been discontinued.  Previously, hospice and end-of-life care were discussed, but patient is not ready to make this decision.  Despite her difficulty with chemotherapy previously, patient has agreed to attempt a second line therapy using carboplatin and gemcitabine.  She has refused Taxol or Taxotere.  Proceed with cycle 1, day 1 of carboplatinum and gemcitabine.  Return to clinic in 1 week for further evaluation and consideration of cycle 1, day 8 which is gemcitabine only.  This will be a 21-day cycle.   2.  Difficulty swallowing: Possibly secondary to paraesophageal mass.  Patient was evaluated by radiation oncology earlier today. 3.  Flank/chest pain: Possibly related to progressive malignancy.  Treatment as above and continue current narcotics as scheduled. 3.  Leukocytosis: Likely reactive, monitor. 4.  Anemia: Patient's hemoglobin is  decreased at 9.5, but relatively stable.  Monitor.     Patient expressed understanding and was in agreement with this plan. She also understands that She can call clinic at any time with any questions, concerns, or complaints.   Cancer Staging Squamous cell carcinoma lung, left ([place]) Staging form: Lung, AJCC 8th Edition - Clinical stage from 12/13/2016: Stage IVA (cT4, cN0, cM1a) - Signed by [name], MD on 12/13/2016   [name], MD   12/15/2017 6:31 AM

## 2017-12-10 ENCOUNTER — Inpatient Hospital Stay: Payer: Medicare HMO

## 2017-12-10 NOTE — Progress Notes (Signed)
Nutrition  Patient was scheduled for nutrition appointment today but grand-daughter called to cancel appointment.  Grand-daughter to call back to reschedule at a later time.   Takerra Lupinacci B. Zenia Resides, [place], [place] Registered Dietitian [phone] (pager)

## 2017-12-11 ENCOUNTER — Encounter: Payer: Self-pay | Admitting: Radiation Oncology

## 2017-12-11 ENCOUNTER — Other Ambulatory Visit: Payer: Self-pay

## 2017-12-11 ENCOUNTER — Inpatient Hospital Stay (HOSPITAL_BASED_OUTPATIENT_CLINIC_OR_DEPARTMENT_OTHER): Payer: Medicare HMO | Admitting: Oncology

## 2017-12-11 ENCOUNTER — Ambulatory Visit
Admission: RE | Admit: 2017-12-11 | Discharge: 2017-12-11 | Disposition: A | Discharge status: Home / Self Care | Payer: Medicare HMO | Source: Ambulatory Visit | Attending: Radiation Oncology | Admitting: Radiation Oncology

## 2017-12-11 ENCOUNTER — Inpatient Hospital Stay: Payer: Medicare HMO

## 2017-12-11 VITALS — BP 111/69 | HR 102 | Temp 96.3°F | Resp 18 | Wt 147.9 lb

## 2017-12-11 DIAGNOSIS — C7889 Secondary malignant neoplasm of other digestive organs: Secondary | ICD-10-CM | POA: Diagnosis not present

## 2017-12-11 DIAGNOSIS — C7971 Secondary malignant neoplasm of right adrenal gland: Secondary | ICD-10-CM

## 2017-12-11 DIAGNOSIS — D649 Anemia, unspecified: Secondary | ICD-10-CM

## 2017-12-11 DIAGNOSIS — D72829 Elevated white blood cell count, unspecified: Secondary | ICD-10-CM | POA: Diagnosis not present

## 2017-12-11 DIAGNOSIS — Z5111 Encounter for antineoplastic chemotherapy: Secondary | ICD-10-CM | POA: Diagnosis not present

## 2017-12-11 DIAGNOSIS — Z923 Personal history of irradiation: Secondary | ICD-10-CM | POA: Diagnosis not present

## 2017-12-11 DIAGNOSIS — Z87891 Personal history of nicotine dependence: Secondary | ICD-10-CM | POA: Insufficient documentation

## 2017-12-11 DIAGNOSIS — R530 Neoplastic (malignant) related fatigue: Secondary | ICD-10-CM | POA: Diagnosis not present

## 2017-12-11 DIAGNOSIS — C7951 Secondary malignant neoplasm of bone: Secondary | ICD-10-CM | POA: Diagnosis not present

## 2017-12-11 DIAGNOSIS — Z79899 Other long term (current) drug therapy: Secondary | ICD-10-CM

## 2017-12-11 DIAGNOSIS — Z7901 Long term (current) use of anticoagulants: Secondary | ICD-10-CM

## 2017-12-11 DIAGNOSIS — C3492 Malignant neoplasm of unspecified part of left bronchus or lung: Secondary | ICD-10-CM | POA: Diagnosis not present

## 2017-12-11 DIAGNOSIS — I480 Paroxysmal atrial fibrillation: Secondary | ICD-10-CM

## 2017-12-11 DIAGNOSIS — R0602 Shortness of breath: Secondary | ICD-10-CM

## 2017-12-11 DIAGNOSIS — R079 Chest pain, unspecified: Secondary | ICD-10-CM

## 2017-12-11 DIAGNOSIS — R1310 Dysphagia, unspecified: Secondary | ICD-10-CM

## 2017-12-11 DIAGNOSIS — Z8249 Family history of ischemic heart disease and other diseases of the circulatory system: Secondary | ICD-10-CM

## 2017-12-11 LAB — COMPREHENSIVE METABOLIC PANEL
ALBUMIN: 2.9 g/dL — AB (ref 3.5–5.0)
ALT: 5 U/L (ref 0–44)
AST: 10 U/L — AB (ref 15–41)
Alkaline Phosphatase: 94 U/L (ref 38–126)
Anion gap: 10 (ref 5–15)
BILIRUBIN TOTAL: 0.4 mg/dL (ref 0.3–1.2)
BUN: 13 mg/dL (ref 8–23)
CHLORIDE: 100 mmol/L (ref 98–111)
CO2: 24 mmol/L (ref 22–32)
Calcium: 8.6 mg/dL — ABNORMAL LOW (ref 8.9–10.3)
Creatinine, Ser: 1 mg/dL (ref 0.44–1.00)
GFR calc Af Amer: 59 mL/min — ABNORMAL LOW (ref >60)
GFR calc non Af Amer: 51 mL/min — ABNORMAL LOW (ref >60)
GLUCOSE: 122 mg/dL — AB (ref 70–99)
POTASSIUM: 4.2 mmol/L (ref 3.5–5.1)
SODIUM: 134 mmol/L — AB (ref 135–145)
TOTAL PROTEIN: 6.6 g/dL (ref 6.5–8.1)

## 2017-12-11 LAB — CBC WITH DIFFERENTIAL/PLATELET
Basophils Absolute: 0.1 10*3/uL (ref 0–0.1)
Basophils Relative: 1 %
Eosinophils Absolute: 0.3 10*3/uL (ref 0–0.7)
Eosinophils Relative: 2 %
HEMATOCRIT: 29.2 % — AB (ref 35.0–47.0)
Hemoglobin: 9.5 g/dL — ABNORMAL LOW (ref 12.0–16.0)
LYMPHS ABS: 1 10*3/uL (ref 1.0–3.6)
LYMPHS PCT: 7 %
MCH: 23.9 pg — AB (ref 26.0–34.0)
MCHC: 32.6 g/dL (ref 32.0–36.0)
MCV: 73.5 fL — AB (ref 80.0–100.0)
MONO ABS: 1 10*3/uL — AB (ref 0.2–0.9)
MONOS PCT: 6 %
Neutro Abs: 13 10*3/uL — ABNORMAL HIGH (ref 1.4–6.5)
Neutrophils Relative %: 84 %
PLATELETS: 382 10*3/uL (ref 150–440)
RBC: 3.98 MIL/uL (ref 3.80–5.20)
RDW: 16.3 % — AB (ref 11.5–14.5)
WBC: 15.3 10*3/uL — ABNORMAL HIGH (ref 3.6–11.0)

## 2017-12-11 MED ORDER — HYDROCODONE-ACETAMINOPHEN 5-325 MG PO TABS: 1.0000 | ORAL_TABLET | ORAL | 0 refills | Status: DC | PRN

## 2017-12-11 MED ORDER — SODIUM CHLORIDE 0.9 % IV SOLN
Freq: Once | INTRAVENOUS | Status: AC
  Administered 2017-12-11: 14:00:00 via INTRAVENOUS
  Filled 2017-12-11: qty 250

## 2017-12-11 MED ORDER — SODIUM CHLORIDE 0.9 % IV SOLN: 10.0000 mg | Freq: Once | INTRAVENOUS | Status: DC

## 2017-12-11 MED ORDER — DEXAMETHASONE SODIUM PHOSPHATE 10 MG/ML IJ SOLN
10.0000 mg | Freq: Once | INTRAMUSCULAR | Status: AC
  Administered 2017-12-11: 10 mg via INTRAVENOUS
  Filled 2017-12-11: qty 1

## 2017-12-11 MED ORDER — SODIUM CHLORIDE 0.9 % IV SOLN
1800.0000 mg | Freq: Once | INTRAVENOUS | Status: AC
  Administered 2017-12-11: 1800 mg via INTRAVENOUS
  Filled 2017-12-11: qty 21.04

## 2017-12-11 MED ORDER — SODIUM CHLORIDE 0.9 % IV SOLN
350.0000 mg | Freq: Once | INTRAVENOUS | Status: AC
  Administered 2017-12-11: 350 mg via INTRAVENOUS
  Filled 2017-12-11: qty 35

## 2017-12-11 MED ORDER — PALONOSETRON HCL INJECTION 0.25 MG/5ML
0.2500 mg | Freq: Once | INTRAVENOUS | Status: AC
  Administered 2017-12-11: 0.25 mg via INTRAVENOUS
  Filled 2017-12-11: qty 5

## 2017-12-11 MED ORDER — HEPARIN SOD (PORK) LOCK FLUSH 100 UNIT/ML IV SOLN
500.0000 [IU] | Freq: Once | INTRAVENOUS | Status: AC | PRN
  Administered 2017-12-11: 500 [IU]
  Filled 2017-12-11: qty 5

## 2017-12-11 NOTE — Progress Notes (Signed)
Here for follow up. Per pt saw Dr Donella Stade this am for consulatation. Per pt continues to have r sided chest pain -under R breast x 2 w -pressure feeling .

## 2017-12-11 NOTE — Progress Notes (Signed)
Radiation Oncology Follow up Note  Name: [name]   Date:   12/11/2017 MRN:  [id number] DOB: 11/23/1932    This 84 y.o. female presents to the clinic today for palliative radiation therapy to her mediastinum for esophageal obstruction due to tumor progression in patient with known stage IV lung cancer.  REFERRING PROVIDER: Albina Billet, MD  HPI: patient is a 84 year old female well-known to department having received multiple courses of palliative radiation therapy for metastatic stage IV lung cancer. She's received radiation to her left ribs salvage treatment to her left ribs also set received treatment to her mediastinum in the area of now obvious progression of disease by PET CT criteria. She's having significant dysphagia only able to drink some liquids..recent PET CT scan shows progression of disease with a new large right adrenal metastatic lesion as well as left lower paraesophageal mass with hypermetabolic activity increasing in size. She has been on Keytruda which is been discontinued a she is planned to go undergo carboplatinum and gemcitabine. Areas of palliative radiation therapy have responded well with very little pain or discomfort. She seen today for consideration of palliative treatment to the paraesophageal mass.  COMPLICATIONS OF TREATMENT: none  FOLLOW UP COMPLIANCE: keeps appointments   PHYSICAL EXAM:  BP (P) 106/66 (BP Location: Left Arm, Patient Position: Sitting)   Pulse (P) 81   Temp (P) 97.7 F (36.5 C) (Tympanic)   Wt (P) 147 lb 11.3 oz (67 kg)   BMI (P) 26.17 kg/m  Wheelchair-bound frail-appearing female in NADWell-developed well-nourished patient in NAD. HEENT reveals PERLA, EOMI, discs not visualized.  Oral cavity is clear. No oral mucosal lesions are identified. Neck is clear without evidence of cervical or supraclavicular adenopathy. Lungs are clear to A&P. Cardiac examination is essentially unremarkable with regular rate and rhythm without murmur rub or  thrill. Abdomen is benign with no organomegaly or masses noted. Motor sensory and DTR levels are equal and symmetric in the upper and lower extremities. Cranial nerves II through XII are grossly intact. Proprioception is intact. No peripheral adenopathy or edema is identified. No motor or sensory levels are noted. Crude visual fields are within normal range.  RADIOLOGY RESULTS: CT scans and PET/CT scans reviewed compatible with the above-stated findings  PLAN: at this time like to go ahead with the palliative course of radiation therapy to the paraesophageal mass. This is in the same region we previously treated up to 4000 cGy with palliative treatment in the past. I believe I can get another 3000 cGy in 15 fractions using IM RT treatment planning and delivery to try to diminish overlapped previously treated fields spinal cord heart. Risks and benefits of treatment including possible radiation esophagitis fatigue alteration of blood counts skin reaction all were discussed in detail with the patient and her daughter. They both seem to comprehend my treatment plan well.There will be extra effort by both professional staff as well as technical staff to coordinate and manage concurrent chemoradiation and ensuing side effects during her treatments.I have personally set up and ordered CT simulation for later this week. We'll use PET CT fusion study for treatment planning.  I would like to take this opportunity to thank you for allowing me to participate in the care of your patient.Noreene Filbert, MD

## 2017-12-12 ENCOUNTER — Ambulatory Visit
Admission: RE | Admit: 2017-12-12 | Discharge: 2017-12-12 | Disposition: A | Discharge status: Home / Self Care | Payer: Medicare HMO | Source: Ambulatory Visit | Attending: Radiation Oncology | Admitting: Radiation Oncology

## 2017-12-12 DIAGNOSIS — Z510 Encounter for antineoplastic radiation therapy: Secondary | ICD-10-CM | POA: Insufficient documentation

## 2017-12-12 DIAGNOSIS — C3492 Malignant neoplasm of unspecified part of left bronchus or lung: Secondary | ICD-10-CM | POA: Diagnosis not present

## 2017-12-17 DIAGNOSIS — Z510 Encounter for antineoplastic radiation therapy: Secondary | ICD-10-CM | POA: Diagnosis not present

## 2017-12-17 NOTE — Progress Notes (Signed)
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  INTERVAL HISTORY: Patient returns to clinic today for further evaluation and consideration of cycle 1, day 8 of carboplatin and gemcitabine.  Gemcitabine only today.  Her performance status has significantly declined over the past week since receiving treatment.  She continues to have flank pain as well as decreased appetite and difficulty swallowing.  She continues to have shortness of breath.  She has no neurologic complaints.  She denies any nausea, vomiting, constipation, or diarrhea. She has no urinary complaints.  Patient feels generally terrible, but offers no further specific complaints today.  REVIEW OF SYSTEMS:   Review of Systems  Constitutional: Positive for malaise/fatigue. Negative for fever and weight loss.  HENT: Negative for sore throat.   Respiratory: Positive for shortness of breath. Negative for cough and hemoptysis.   Cardiovascular: Positive for chest pain. Negative for leg swelling.  Gastrointestinal: Negative.  Negative for abdominal pain and diarrhea.  Genitourinary: Positive for flank pain.  Musculoskeletal: Positive for back pain. Negative for falls and joint pain.  Skin: Negative.  Negative for itching and rash.  Neurological: Positive for weakness. Negative for sensory change, focal weakness and headaches.  Psychiatric/Behavioral: Negative.  The patient is not nervous/anxious and does not have insomnia.     As per HPI. Otherwise, a complete review of systems is negative.  PAST MEDICAL HISTORY: Past Medical History:  Diagnosis Date  . COPD (chronic obstructive pulmonary disease) ([place])   . GU (gastric peptic ulcer)   . Lung cancer  ([place])   . Malignant pleural effusion   . PAF (paroxysmal atrial fibrillation) ([place]) 10/2016   a. 10/2016 post thoracentesis ; b. 10/2016 Echo: EF 65-70%, mild LVH;  c. Amio/Eliquis initiated (CHA2DSVASc = 3).  . Pneumonia   . Wrist fracture 1993   bilateral    PAST SURGICAL HISTORY: Past Surgical History:  Procedure Laterality Date  . ABDOMINAL HYSTERECTOMY    . CATARACT EXTRACTION W/ INTRAOCULAR LENS  IMPLANT, BILATERAL    . CHEST TUBE INSERTION Left 12/18/2016   Procedure: CHEST TUBE INSERTION;  Surgeon: Brianna Lewandowsky, MD;  Location: [facility] ORS;  Service: General;  Laterality: Left;  . CHOLECYSTECTOMY    . CLOSED REDUCTION WRIST FRACTURE Left 01/04/2017   Procedure: CLOSED REDUCTION WRIST;  Surgeon: Brianna Park, MD;  Location: [facility] ORS;  Service: Orthopedics;  Laterality: Left;  . PORTACATH PLACEMENT Left 12/18/2016   Procedure: INSERTION PORT-A-CATH;  Surgeon: Brianna Lewandowsky, MD;  Location: [facility] ORS;  Service: General;  Laterality: Left;  . REMOVAL OF PLEURAL DRAINAGE CATHETER N/A 01/02/2017   Procedure: REMOVAL OF PLEURAL DRAINAGE CATHETER;  Surgeon: Brianna Lewandowsky, MD;  Location: [facility] ORS;  Service: Thoracic;  Laterality: N/A;  . TONSILLECTOMY      FAMILY HISTORY: Family History  Problem Relation Age of Onset  . Hypertension Father   . Heart disease Father   . Heart attack Father   . Congestive Heart Failure Mother   . Stroke Mother   . Atrial fibrillation Sister     ADVANCED DIRECTIVES (Y/N):  N  HEALTH MAINTENANCE: Social History   Tobacco Use  . Smoking status: Former Smoker    Packs/day: 1.00    Years: 68.00    Pack years: 68.00  Last attempt to quit: 11/06/2016    Years since quitting: 1.1  . Smokeless tobacco: Never Used  Substance Use Topics  . Alcohol use: No  . Drug use: No     Colonoscopy:  PAP:  Bone density:  Lipid panel:  Allergies  Allergen Reactions  . Sulfa Antibiotics Other (See Comments)    Seizures   . Levaquin [Levofloxacin]  Hives and Itching    Current Outpatient Medications  Medication Sig Dispense Refill  . amiodarone (PACERONE) 200 MG tablet Take 200 mg by mouth daily.  11  . apixaban (ELIQUIS) 5 MG TABS tablet Take 1 tablet (5 mg total) by mouth 2 (two) times daily. 60 tablet 0  . BIOTIN PO Take by mouth daily.    . cetirizine (ZYRTEC) 10 MG tablet Take 10 mg by mouth daily as needed for allergies.    . cholecalciferol (VITAMIN D) 1000 units tablet Take 1,000 Units by mouth daily.    Marland Kitchen diltiazem (CARDIZEM CD) 120 MG 24 hr capsule Take 1 capsule (120 mg total) by mouth daily.    Marland Kitchen escitalopram (LEXAPRO) 5 MG tablet Take 5 mg by mouth daily.    . furosemide (LASIX) 20 MG tablet Take 1 tablet by mouth daily as needed. For EDEMA  3  . HYDROcodone-acetaminophen (NORCO) 5-325 MG tablet Take 1 tablet by mouth every 4 (four) hours as needed for moderate pain. 60 tablet 0  . lidocaine-prilocaine (EMLA) cream APPLY TO AFFECTED AREA ONCE  3  . mirtazapine (REMERON) 7.5 MG tablet Take 7.5 mg by mouth at bedtime.    . Multiple Vitamins-Minerals (MULTIVITAMIN ADULT PO) Take by mouth.    Marland Kitchen omeprazole (PRILOSEC) 20 MG capsule TAKE 1 CAPSULE EVERY DAY 90 capsule 2  . ondansetron (ZOFRAN) 8 MG tablet TAKE 1 TABLET TWO TIMES DAILY AS NEEDED FOR REFRACTORY NAUSEA OR VOMITING.  120 tablet 0  . ondansetron (ZOFRAN) 8 MG tablet Take 1 tablet (8 mg total) by mouth 2 (two) times daily as needed for refractory nausea / vomiting. Start on day 3 after carboplatin chemo. 30 tablet 2  . potassium chloride SA (K-DUR,KLOR-CON) 20 MEQ tablet Take 20 mEq 2 (two) times daily by mouth.    . dexamethasone (DECADRON) 4 MG tablet Take 1 tablet (4 mg total) by mouth daily. 30 tablet 2  . diphenoxylate-atropine (LOMOTIL) 2.5-0.025 MG tablet Take 1 tablet 4 (four) times daily as needed by mouth for diarrhea or loose stools. 120 tablet 0  . fentaNYL (DURAGESIC - DOSED MCG/HR) 25 MCG/HR patch Place 1 patch (25 mcg total) onto the skin every 3 (three)  days. 10 patch 0  . metoCLOPramide (REGLAN) 10 MG tablet Take 1 tablet (10 mg total) by mouth 4 (four) times daily. 120 tablet 0  . prochlorperazine (COMPAZINE) 10 MG tablet Take 1 tablet (10 mg total) by mouth every 6 (six) hours as needed (Nausea or vomiting). 60 tablet 2   No current facility-administered medications for this visit.    Facility-Administered Medications Ordered in Other Visits  Medication Dose Route Frequency Provider Last Rate Last Dose  . heparin lock flush 100 unit/mL  500 Units Intravenous Once [name], MD      . sodium chloride flush (NS) 0.9 % injection 10 mL  10 mL Intravenous Once [name], MD        OBJECTIVE: Vitals:   12/18/17 1340  BP: (!) 82/58  Pulse: (!) 107  Resp: 18  Temp: 97.6 F (36.4 C)  SpO2: 99%  Body mass index is 25.88 kg/m.    ECOG FS:2 - Symptomatic, <50% confined to bed  General: Ill-appearing, no acute distress.  Sitting in wheelchair. Eyes: Pink conjunctiva, anicteric sclera. HEENT: Normocephalic, moist mucous membranes, clear oropharnyx. Lungs: Clear to auscultation bilaterally. Heart: Regular rate and rhythm. No rubs, murmurs, or gallops. Abdomen: Soft, nontender, nondistended. No organomegaly noted, normoactive bowel sounds. Musculoskeletal: No edema, cyanosis, or clubbing. Neuro: Alert, answering all questions appropriately. Cranial nerves grossly intact. Skin: No rashes or petechiae noted. Psych: Normal affect.  LAB RESULTS:  Lab Results  Component Value Date   NA 135 12/21/2017   K 4.1 12/21/2017   CL 99 12/21/2017   CO2 25 12/21/2017   GLUCOSE 113 (H) 12/21/2017   BUN 25 (H) 12/21/2017   CREATININE 1.21 (H) 12/21/2017   CALCIUM 9.2 12/21/2017   PROT 6.4 (L) 12/21/2017   ALBUMIN 3.1 (L) 12/21/2017   AST 21 12/21/2017   ALT 11 12/21/2017   ALKPHOS 94 12/21/2017   BILITOT 0.5 12/21/2017   GFRNONAA 40 (L) 12/21/2017   GFRAA 47 (L) 12/21/2017    Lab Results  Component Value Date    WBC 6.9 12/21/2017   NEUTROABS 6.2 12/21/2017   HGB 8.5 (L) 12/21/2017   HCT 26.2 (L) 12/21/2017   MCV 73.0 (L) 12/21/2017   PLT 72 (L) 12/21/2017     STUDIES: No results found.  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  PLAN:    1. 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  She is noted to have a new large right adrenal metastatic lesion as well as a new central pancreatic body metastatic lesion.  Left adrenal lesion is bigger.  The dominant left lower paraesophageal mass has also increased in size.  Beryle Flock has been discontinued.  Previously, hospice and end-of-life care were discussed, but patient is not ready to make this decision.  Despite her difficulty with chemotherapy previously, patient has agreed to attempt a second line therapy using carboplatin and gemcitabine.  She has refused Taxol or Taxotere.  Delay cycle 1, day 8 of carboplatin and gemcitabine, gemcitabine only, today secondary to decreased performance status.  Patient will instead receive IV fluids.  Return to clinic in 1 week for further evaluation and consideration of reinitiating treatment.    2.  Difficulty swallowing: Possibly secondary to paraesophageal mass.  Proceed with XRT.  Completing on January 11, 2018.  3.  Flank/chest pain: Possibly related to progressive malignancy.  Treatment as above and continue current narcotics as scheduled. 3.  Renal insufficiency: Likely secondary to poor appetite and dehydration.  Patient received 1 L of IV fluids. 4.  Thrombocytopenia: Mild, monitor. 5.  Anemia: Patient's hemoglobin is decreased, but stable.  Monitor.    Patient expressed understanding and was in agreement with this plan. She also understands that She can call clinic at any time with any questions, concerns, or complaints.   Cancer Staging Squamous cell carcinoma lung, left  ([place]) Staging form: Lung, AJCC 8th Edition - Clinical stage from 12/13/2016: Stage IVA (cT4, cN0, cM1a) - Signed by [name], MD on 12/13/2016   [name], MD   12/21/2017 3:35 PM

## 2017-12-18 ENCOUNTER — Inpatient Hospital Stay: Payer: Medicare HMO | Attending: Oncology

## 2017-12-18 ENCOUNTER — Inpatient Hospital Stay (HOSPITAL_BASED_OUTPATIENT_CLINIC_OR_DEPARTMENT_OTHER): Payer: Medicare HMO | Admitting: Oncology

## 2017-12-18 ENCOUNTER — Encounter: Payer: Self-pay | Admitting: Oncology

## 2017-12-18 ENCOUNTER — Inpatient Hospital Stay: Payer: Medicare HMO

## 2017-12-18 ENCOUNTER — Other Ambulatory Visit: Payer: Self-pay | Admitting: *Deleted

## 2017-12-18 VITALS — BP 82/58 | HR 107 | Temp 97.6°F | Resp 18 | Wt 146.1 lb

## 2017-12-18 DIAGNOSIS — C3492 Malignant neoplasm of unspecified part of left bronchus or lung: Secondary | ICD-10-CM | POA: Insufficient documentation

## 2017-12-18 DIAGNOSIS — R530 Neoplastic (malignant) related fatigue: Secondary | ICD-10-CM

## 2017-12-18 DIAGNOSIS — Z87891 Personal history of nicotine dependence: Secondary | ICD-10-CM

## 2017-12-18 DIAGNOSIS — C7971 Secondary malignant neoplasm of right adrenal gland: Secondary | ICD-10-CM | POA: Diagnosis not present

## 2017-12-18 DIAGNOSIS — I951 Orthostatic hypotension: Secondary | ICD-10-CM | POA: Insufficient documentation

## 2017-12-18 DIAGNOSIS — N289 Disorder of kidney and ureter, unspecified: Secondary | ICD-10-CM

## 2017-12-18 DIAGNOSIS — M549 Dorsalgia, unspecified: Secondary | ICD-10-CM

## 2017-12-18 DIAGNOSIS — I480 Paroxysmal atrial fibrillation: Secondary | ICD-10-CM | POA: Insufficient documentation

## 2017-12-18 DIAGNOSIS — D701 Agranulocytosis secondary to cancer chemotherapy: Secondary | ICD-10-CM | POA: Diagnosis not present

## 2017-12-18 DIAGNOSIS — R0602 Shortness of breath: Secondary | ICD-10-CM

## 2017-12-18 DIAGNOSIS — Z8249 Family history of ischemic heart disease and other diseases of the circulatory system: Secondary | ICD-10-CM | POA: Diagnosis not present

## 2017-12-18 DIAGNOSIS — E46 Unspecified protein-calorie malnutrition: Secondary | ICD-10-CM | POA: Insufficient documentation

## 2017-12-18 DIAGNOSIS — N390 Urinary tract infection, site not specified: Secondary | ICD-10-CM | POA: Insufficient documentation

## 2017-12-18 DIAGNOSIS — R079 Chest pain, unspecified: Secondary | ICD-10-CM | POA: Insufficient documentation

## 2017-12-18 DIAGNOSIS — D6959 Other secondary thrombocytopenia: Secondary | ICD-10-CM | POA: Diagnosis not present

## 2017-12-18 DIAGNOSIS — E860 Dehydration: Secondary | ICD-10-CM | POA: Diagnosis not present

## 2017-12-18 DIAGNOSIS — Z7901 Long term (current) use of anticoagulants: Secondary | ICD-10-CM

## 2017-12-18 DIAGNOSIS — R109 Unspecified abdominal pain: Secondary | ICD-10-CM | POA: Diagnosis not present

## 2017-12-18 DIAGNOSIS — Z79899 Other long term (current) drug therapy: Secondary | ICD-10-CM | POA: Diagnosis not present

## 2017-12-18 DIAGNOSIS — R1310 Dysphagia, unspecified: Secondary | ICD-10-CM | POA: Insufficient documentation

## 2017-12-18 DIAGNOSIS — R531 Weakness: Secondary | ICD-10-CM

## 2017-12-18 LAB — CBC WITH DIFFERENTIAL/PLATELET
BASOS ABS: 0 10*3/uL (ref 0–0.1)
Basophils Relative: 0 %
EOS ABS: 0.1 10*3/uL (ref 0–0.7)
Eosinophils Relative: 1 %
HCT: 29.8 % — ABNORMAL LOW (ref 35.0–47.0)
HEMOGLOBIN: 9.6 g/dL — AB (ref 12.0–16.0)
LYMPHS PCT: 7 %
Lymphs Abs: 0.6 10*3/uL — ABNORMAL LOW (ref 1.0–3.6)
MCH: 23.6 pg — ABNORMAL LOW (ref 26.0–34.0)
MCHC: 32.1 g/dL (ref 32.0–36.0)
MCV: 73.5 fL — ABNORMAL LOW (ref 80.0–100.0)
Monocytes Absolute: 0.2 10*3/uL (ref 0.2–0.9)
Monocytes Relative: 2 %
NEUTROS PCT: 90 %
Neutro Abs: 7.7 10*3/uL — ABNORMAL HIGH (ref 1.4–6.5)
Platelets: 145 10*3/uL — ABNORMAL LOW (ref 150–440)
RBC: 4.05 MIL/uL (ref 3.80–5.20)
RDW: 16 % — ABNORMAL HIGH (ref 11.5–14.5)
WBC: 8.6 10*3/uL (ref 3.6–11.0)

## 2017-12-18 LAB — COMPREHENSIVE METABOLIC PANEL
ALT: 13 U/L (ref 0–44)
AST: 24 U/L (ref 15–41)
Albumin: 3.1 g/dL — ABNORMAL LOW (ref 3.5–5.0)
Alkaline Phosphatase: 118 U/L (ref 38–126)
Anion gap: 14 (ref 5–15)
BUN: 27 mg/dL — ABNORMAL HIGH (ref 8–23)
CO2: 24 mmol/L (ref 22–32)
Calcium: 8.9 mg/dL (ref 8.9–10.3)
Chloride: 94 mmol/L — ABNORMAL LOW (ref 98–111)
Creatinine, Ser: 1.58 mg/dL — ABNORMAL HIGH (ref 0.44–1.00)
GFR calc non Af Amer: 29 mL/min — ABNORMAL LOW (ref >60)
GFR, EST AFRICAN AMERICAN: 34 mL/min — AB (ref >60)
Glucose, Bld: 137 mg/dL — ABNORMAL HIGH (ref 70–99)
POTASSIUM: 3.5 mmol/L (ref 3.5–5.1)
Sodium: 132 mmol/L — ABNORMAL LOW (ref 135–145)
Total Bilirubin: 0.3 mg/dL (ref 0.3–1.2)
Total Protein: 7 g/dL (ref 6.5–8.1)

## 2017-12-18 MED ORDER — SODIUM CHLORIDE 0.9 % IV SOLN
INTRAVENOUS | Status: AC
  Administered 2017-12-18 (×2): via INTRAVENOUS
  Filled 2017-12-18: qty 250

## 2017-12-18 MED ORDER — SODIUM CHLORIDE 0.9% FLUSH
10.0000 mL | Freq: Once | INTRAVENOUS | Status: AC
  Administered 2017-12-18: 10 mL via INTRAVENOUS
  Filled 2017-12-18: qty 10

## 2017-12-18 MED ORDER — SODIUM CHLORIDE 0.9 % IV SOLN: Freq: Once | INTRAVENOUS | Status: DC

## 2017-12-18 MED ORDER — DEXAMETHASONE 4 MG PO TABS: 4.0000 mg | ORAL_TABLET | Freq: Every day | ORAL | 2 refills | Status: AC

## 2017-12-18 MED ORDER — ONDANSETRON HCL 4 MG/2ML IJ SOLN
8.0000 mg | Freq: Once | INTRAMUSCULAR | Status: AC
  Administered 2017-12-18: 8 mg via INTRAVENOUS
  Filled 2017-12-18: qty 4

## 2017-12-18 MED ORDER — SODIUM CHLORIDE 0.9 % IV SOLN
Freq: Once | INTRAVENOUS | Status: DC
  Filled 2017-12-18: qty 250

## 2017-12-18 MED ORDER — DEXAMETHASONE SODIUM PHOSPHATE 10 MG/ML IJ SOLN
10.0000 mg | Freq: Once | INTRAMUSCULAR | Status: AC
  Administered 2017-12-18: 10 mg via INTRAVENOUS
  Filled 2017-12-18: qty 1

## 2017-12-18 MED ORDER — FENTANYL 25 MCG/HR TD PT72: 25.0000 ug | MEDICATED_PATCH | TRANSDERMAL | 0 refills | Status: DC

## 2017-12-18 MED ORDER — HEPARIN SOD (PORK) LOCK FLUSH 100 UNIT/ML IV SOLN
500.0000 [IU] | Freq: Once | INTRAVENOUS | Status: AC
  Administered 2017-12-18: 500 [IU] via INTRAVENOUS
  Filled 2017-12-18: qty 5

## 2017-12-18 NOTE — Progress Notes (Signed)
Patient reports pain in chest, back and left side. Patient also reports decreased appetite and difficulty swallowing.

## 2017-12-19 ENCOUNTER — Ambulatory Visit
Admission: RE | Admit: 2017-12-19 | Discharge: 2017-12-19 | Disposition: A | Discharge status: Home / Self Care | Payer: Medicare HMO | Source: Ambulatory Visit | Attending: Radiation Oncology | Admitting: Radiation Oncology

## 2017-12-19 DIAGNOSIS — C3492 Malignant neoplasm of unspecified part of left bronchus or lung: Secondary | ICD-10-CM | POA: Insufficient documentation

## 2017-12-19 DIAGNOSIS — Z510 Encounter for antineoplastic radiation therapy: Secondary | ICD-10-CM | POA: Insufficient documentation

## 2017-12-20 ENCOUNTER — Ambulatory Visit
Admission: RE | Admit: 2017-12-20 | Discharge: 2017-12-20 | Disposition: A | Discharge status: Home / Self Care | Payer: Medicare HMO | Source: Ambulatory Visit | Attending: Radiation Oncology | Admitting: Radiation Oncology

## 2017-12-20 DIAGNOSIS — Z510 Encounter for antineoplastic radiation therapy: Secondary | ICD-10-CM | POA: Diagnosis present

## 2017-12-20 DIAGNOSIS — C3492 Malignant neoplasm of unspecified part of left bronchus or lung: Secondary | ICD-10-CM | POA: Diagnosis not present

## 2017-12-21 ENCOUNTER — Other Ambulatory Visit: Payer: Self-pay

## 2017-12-21 ENCOUNTER — Encounter: Payer: Self-pay | Admitting: Oncology

## 2017-12-21 ENCOUNTER — Other Ambulatory Visit: Payer: Self-pay | Admitting: *Deleted

## 2017-12-21 ENCOUNTER — Ambulatory Visit
Admission: RE | Admit: 2017-12-21 | Discharge: 2017-12-21 | Disposition: A | Discharge status: Home / Self Care | Payer: Medicare HMO | Source: Ambulatory Visit | Attending: Radiation Oncology | Admitting: Radiation Oncology

## 2017-12-21 ENCOUNTER — Inpatient Hospital Stay: Payer: Medicare HMO

## 2017-12-21 ENCOUNTER — Inpatient Hospital Stay (HOSPITAL_BASED_OUTPATIENT_CLINIC_OR_DEPARTMENT_OTHER): Payer: Medicare HMO | Admitting: Oncology

## 2017-12-21 VITALS — BP 127/70 | HR 71 | Temp 97.1°F | Resp 18

## 2017-12-21 DIAGNOSIS — C3492 Malignant neoplasm of unspecified part of left bronchus or lung: Secondary | ICD-10-CM | POA: Diagnosis not present

## 2017-12-21 DIAGNOSIS — Z5189 Encounter for other specified aftercare: Secondary | ICD-10-CM

## 2017-12-21 DIAGNOSIS — E860 Dehydration: Secondary | ICD-10-CM

## 2017-12-21 DIAGNOSIS — C7971 Secondary malignant neoplasm of right adrenal gland: Secondary | ICD-10-CM | POA: Diagnosis not present

## 2017-12-21 DIAGNOSIS — I951 Orthostatic hypotension: Secondary | ICD-10-CM | POA: Diagnosis not present

## 2017-12-21 DIAGNOSIS — Z510 Encounter for antineoplastic radiation therapy: Secondary | ICD-10-CM | POA: Diagnosis not present

## 2017-12-21 DIAGNOSIS — Z95828 Presence of other vascular implants and grafts: Secondary | ICD-10-CM

## 2017-12-21 LAB — CBC WITH DIFFERENTIAL/PLATELET
BASOS ABS: 0 10*3/uL (ref 0–0.1)
Basophils Relative: 0 %
Eosinophils Absolute: 0 10*3/uL (ref 0–0.7)
Eosinophils Relative: 0 %
HCT: 26.2 % — ABNORMAL LOW (ref 35.0–47.0)
Hemoglobin: 8.5 g/dL — ABNORMAL LOW (ref 12.0–16.0)
LYMPHS PCT: 5 %
Lymphs Abs: 0.3 10*3/uL — ABNORMAL LOW (ref 1.0–3.6)
MCH: 23.6 pg — AB (ref 26.0–34.0)
MCHC: 32.3 g/dL (ref 32.0–36.0)
MCV: 73 fL — AB (ref 80.0–100.0)
MONO ABS: 0.4 10*3/uL (ref 0.2–0.9)
Monocytes Relative: 5 %
Neutro Abs: 6.2 10*3/uL (ref 1.4–6.5)
Neutrophils Relative %: 90 %
PLATELETS: 72 10*3/uL — AB (ref 150–440)
RBC: 3.59 MIL/uL — ABNORMAL LOW (ref 3.80–5.20)
RDW: 16 % — ABNORMAL HIGH (ref 11.5–14.5)
WBC: 6.9 10*3/uL (ref 3.6–11.0)

## 2017-12-21 LAB — COMPREHENSIVE METABOLIC PANEL
ALT: 11 U/L (ref 0–44)
ANION GAP: 11 (ref 5–15)
AST: 21 U/L (ref 15–41)
Albumin: 3.1 g/dL — ABNORMAL LOW (ref 3.5–5.0)
Alkaline Phosphatase: 94 U/L (ref 38–126)
BUN: 25 mg/dL — ABNORMAL HIGH (ref 8–23)
CHLORIDE: 99 mmol/L (ref 98–111)
CO2: 25 mmol/L (ref 22–32)
Calcium: 9.2 mg/dL (ref 8.9–10.3)
Creatinine, Ser: 1.21 mg/dL — ABNORMAL HIGH (ref 0.44–1.00)
GFR, EST AFRICAN AMERICAN: 47 mL/min — AB (ref >60)
GFR, EST NON AFRICAN AMERICAN: 40 mL/min — AB (ref >60)
Glucose, Bld: 113 mg/dL — ABNORMAL HIGH (ref 70–99)
Potassium: 4.1 mmol/L (ref 3.5–5.1)
Sodium: 135 mmol/L (ref 135–145)
TOTAL PROTEIN: 6.4 g/dL — AB (ref 6.5–8.1)
Total Bilirubin: 0.5 mg/dL (ref 0.3–1.2)

## 2017-12-21 MED ORDER — SODIUM CHLORIDE 0.9% FLUSH
10.0000 mL | INTRAVENOUS | Status: DC | PRN
  Administered 2017-12-21: 10 mL via INTRAVENOUS
  Filled 2017-12-21: qty 10

## 2017-12-21 MED ORDER — HEPARIN SOD (PORK) LOCK FLUSH 100 UNIT/ML IV SOLN
500.0000 [IU] | Freq: Once | INTRAVENOUS | Status: AC
  Administered 2017-12-21: 500 [IU] via INTRAVENOUS

## 2017-12-21 MED ORDER — SODIUM CHLORIDE 0.9 % IV SOLN
Freq: Once | INTRAVENOUS | Status: AC
  Administered 2017-12-21: 12:00:00 via INTRAVENOUS
  Filled 2017-12-21: qty 250

## 2017-12-21 NOTE — Progress Notes (Signed)
Symptom Management Consult note [facility]  [phone] [phone]  Patient Care Team: Albina Billet, MD as PCP - General (Internal Medicine) Telford Nab, RN as Registered Nurse   Name of the patient: [name]  [id number]  [birth year]   Date of visit: 12/21/2017  Diagnosis: Stage IV squamous call carcinoma of left lung  Chief Complaint: dizziness/weakness  Current Treatment: s/p cycle 1 day 1 carbo/gemzar  Oncology History: Patient was last seen by primary oncologist Dr. Grayland Ormond on 12/18/2017 for further evaluation and consideration of cycle 1 day 1 of carbo and gemcitabine.  At that visit she complained of a right-sided flank chest pain, continued ongoing weakness and fatigue and a decrease in her performance status.  She reported difficulty swallowing particularly solid foods.  She continued to have shortness of breath.  They proceeded with cycle 1 day 1 carbo/gemcitabine.  She is scheduled to return to clinic in 1 week for consideration of cycle 1 day 8 gemcitabine only.   Had consultation with Dr. Donella Stade for potential radiation to her mediastinum for esophageal obstruction due to tumor progression.  She was seen on 12/11/2017 and it was agreed to go ahead with palliative course of radiation to paraesophageal mass.  First radiation treatment was yesterday.  Has had multiple courses of palliative radiation for her stage IV metastatic lung cancer including radiation to her left ribs salvage treatment and radiation to her mediastinum in the area of now obvious progression of disease by PET.   Most recent PET scan from November 22, 2017 revealed progression of disease with a new enlarged right adrenal lesion as well as new central pancreatic body.  Left adrenal lesion was bigger and the dominant left lower paraesophageal mass also increased in size.  Keytruda current therapy was discontinued.  Discussed end-of-life care the patient has opted for  second line therapy using carbo/gemcitabine.    Squamous cell carcinoma lung, left ([facility])   12/12/2016 Initial Diagnosis    Squamous cell carcinoma lung, left ([place])    12/04/2017 -  Chemotherapy    The patient had palonosetron (ALOXI) injection 0.25 mg, 0.25 mg, Intravenous,  Once, 1 of 4 cycles Administration: 0.25 mg (12/11/2017) CARBOplatin (PARAPLATIN) 350 mg in sodium chloride 0.9 % 250 mL chemo infusion, 350 mg (108 % of original dose 327 mg), Intravenous,  Once, 1 of 4 cycles Dose modification:   (original dose 327 mg, Cycle 1) Administration: 350 mg (12/11/2017) gemcitabine (GEMZAR) 1,800 mg in sodium chloride 0.9 % 250 mL chemo infusion, 1,748 mg, Intravenous,  Once, 1 of 4 cycles Administration: 1,800 mg (12/11/2017)  for chemotherapy treatment.      Subjective Data:  Subjective:     [name] is a 84 y.o. female who presents for evaluation of dizziness. The symptoms started 1 day ago and are ongoing. The attacks occur frequently and last a few minutes. Positions that worsen symptoms: standing up. Previous workup/treatments: Has received IV fluids in the past for dehydration and orthostatic hypotension. Associated ear symptoms: none. Associated CNS symptoms: none. Recent infections: none. Head trauma: denied.  Admits to history of dehydration although she has been trying to drink plenty of fluids.  States "water is hard to get down".  Prefers smoothies and thickened liquids. Lacks an appetite. On 4 mg Decadron for appetite stimulant with some improvement.   The following portions of the patient's history were reviewed and updated as appropriate: allergies, current medications, past family history, past medical history, past social history,  past surgical history and problem list.  Review of Systems A comprehensive review of systems was negative except for: Constitutional: positive for fatigue and malaise Respiratory: positive for intermittent shortness of breath Cardiovascular:  positive for dyspnea, fatigue and near-syncope Musculoskeletal: positive for back pain and muscle weakness    Objective:    BP 127/70 (BP Location: Right Arm, Patient Position: Sitting) Comment: 96/60 standing bp  Pulse 71 Comment: 86 standing pulse  Temp (!) 97.1 F (36.2 C) (Tympanic)   Resp 18  General appearance: alert, fatigued, no distress, mildly obese and pale Lungs: clear to auscultation bilaterally Heart: regular rate and rhythm, S1, S2 normal, no murmur, click, rub or gallop Abdomen: soft, non-tender; bowel sounds normal; no masses,  no organomegaly Neurologic: Grossly normal      Assessment:    Medication effect and orhtostatic hypotension    Plan:    IV fluids per orders    Stage IV lung cancer: Unfortunately, patient recently progressed through first-line immunotherapy with Keytruda.  Most recent imaging from 11/21/2017 showed progression by PET.  Treatment was switched to carbo/gemcitabine.  Cycle 1 given on 12/11/2017.  Scheduled to return to clinic on 12/25/2017 for consideration of cycle 2 carbo/gemcitabine.  Receiving daily radiation.  Dehydration/orthostatic hypotension: Patient will receive 1 L NaCl.  She is orthostatic on assessment.  Labs ok. Slight elevation of kidney function.  Explained that it is not uncommon to need IV fluids between chemotherapy treatments.  Patient may benefit from scheduled IV fluids after cycle 2 of carbo/gemcitabine.  Patient and daughter in agreement with plan.  Will call if she needs additional IV fluids prior to next appt.  Dysphasia: Currently receiving radiation in hopes to help with her dysphasia.  Does not feel like she is choking on food.  Just feels a "lump in her throat".  Tolerating soft foods and thickened liquids at this time.  Thin liquids like water "do not go down".  Encourage supplementation with ensures or boost.  May need a referral to dietary at some point.  Also encouraged slow transitions from sitting to standing.    Greater than 50% was spent in counseling and coordination of care with this patient including but not limited to discussion of the relevant topics above (See A&P) including, but not limited to diagnosis and management of acute and chronic medical conditions.   Faythe Casa, NP 12/24/2017 10:37 AM

## 2017-12-23 NOTE — Progress Notes (Signed)
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  INTERVAL HISTORY: Patient returns to clinic today for further evaluation and reconsideration of cycle 1, day 8 of carboplatin and gemcitabine.  Gemcitabine only today.  Her performance status has significantly improved with not receiving treatment last week.  Her pain as well as difficulty swallowing have also improved.  She has no neurologic complaints.  She does not complain of shortness of breath or chest pain today.  She denies any nausea, vomiting, constipation, or diarrhea. She has no urinary complaints.  Patient offers no further specific complaints today.  REVIEW OF SYSTEMS:   Review of Systems  Constitutional: Positive for malaise/fatigue. Negative for fever and weight loss.  HENT: Negative.  Negative for sore throat.   Respiratory: Negative.  Negative for cough, hemoptysis and shortness of breath.   Cardiovascular: Negative.  Negative for chest pain and leg swelling.  Gastrointestinal: Negative.  Negative for abdominal pain and diarrhea.  Genitourinary: Negative.  Negative for flank pain.  Musculoskeletal: Positive for back pain. Negative for falls and joint pain.  Skin: Negative.  Negative for itching and rash.  Neurological: Positive for weakness. Negative for sensory change, focal weakness and headaches.  Psychiatric/Behavioral: Negative.  The patient is not nervous/anxious and does not have insomnia.     As per HPI. Otherwise, a complete review of systems is negative.  PAST MEDICAL HISTORY: Past Medical History:  Diagnosis Date  . COPD (chronic obstructive pulmonary disease) ([place])   . GU (gastric peptic ulcer)   . Lung cancer ([place])   . Malignant  pleural effusion   . PAF (paroxysmal atrial fibrillation) ([place]) 10/2016   a. 10/2016 post thoracentesis ; b. 10/2016 Echo: EF 65-70%, mild LVH;  c. Amio/Eliquis initiated (CHA2DSVASc = 3).  . Pneumonia   . Wrist fracture 1993   bilateral    PAST SURGICAL HISTORY: Past Surgical History:  Procedure Laterality Date  . ABDOMINAL HYSTERECTOMY    . CATARACT EXTRACTION W/ INTRAOCULAR LENS  IMPLANT, BILATERAL    . CHEST TUBE INSERTION Left 12/18/2016   Procedure: CHEST TUBE INSERTION;  Surgeon: [name], MD;  Location: [facility] ORS;  Service: General;  Laterality: Left;  . CHOLECYSTECTOMY    . CLOSED REDUCTION WRIST FRACTURE Left 01/04/2017   Procedure: CLOSED REDUCTION WRIST;  Surgeon: Thornton Park, MD;  Location: [facility] ORS;  Service: Orthopedics;  Laterality: Left;  . PORTACATH PLACEMENT Left 12/18/2016   Procedure: INSERTION PORT-A-CATH;  Surgeon: [name], MD;  Location: [facility] ORS;  Service: General;  Laterality: Left;  . REMOVAL OF PLEURAL DRAINAGE CATHETER N/A 01/02/2017   Procedure: REMOVAL OF PLEURAL DRAINAGE CATHETER;  Surgeon: [name], MD;  Location: [facility] ORS;  Service: Thoracic;  Laterality: N/A;  . TONSILLECTOMY      FAMILY HISTORY: Family History  Problem Relation Age of Onset  . Hypertension Father   . Heart disease Father   . Heart attack Father   . Congestive Heart Failure Mother   . Stroke Mother   . Atrial fibrillation Sister     ADVANCED DIRECTIVES (Y/N):  N  HEALTH MAINTENANCE: Social History   Tobacco Use  . Smoking status: Former Smoker    Packs/day: 1.00    Years: 68.00    Pack years:  68.00    Last attempt to quit: 11/06/2016    Years since quitting: 1.1  . Smokeless tobacco: Never Used  Substance Use Topics  . Alcohol use: No  . Drug use: No     Colonoscopy:  PAP:  Bone density:  Lipid panel:  Allergies  Allergen Reactions  . Sulfa Antibiotics Other (See Comments)    Seizures   . Levaquin [Levofloxacin] Hives and Itching     Current Outpatient Medications  Medication Sig Dispense Refill  . amiodarone (PACERONE) 200 MG tablet Take 200 mg by mouth daily.  11  . apixaban (ELIQUIS) 5 MG TABS tablet Take 1 tablet (5 mg total) by mouth 2 (two) times daily. 60 tablet 0  . BIOTIN PO Take by mouth daily.    . cetirizine (ZYRTEC) 10 MG tablet Take 10 mg by mouth daily as needed for allergies.    . cholecalciferol (VITAMIN D) 1000 units tablet Take 1,000 Units by mouth daily.    Marland Kitchen dexamethasone (DECADRON) 4 MG tablet Take 1 tablet (4 mg total) by mouth daily. 30 tablet 2  . diltiazem (CARDIZEM CD) 120 MG 24 hr capsule Take 1 capsule (120 mg total) by mouth daily.    Marland Kitchen escitalopram (LEXAPRO) 5 MG tablet Take 5 mg by mouth daily.    . fentaNYL (DURAGESIC - DOSED MCG/HR) 25 MCG/HR patch Place 1 patch (25 mcg total) onto the skin every 3 (three) days. 10 patch 0  . furosemide (LASIX) 20 MG tablet Take 1 tablet by mouth daily as needed. For EDEMA  3  . lidocaine-prilocaine (EMLA) cream APPLY TO AFFECTED AREA ONCE  3  . metoCLOPramide (REGLAN) 10 MG tablet TAKE 1 TABLET (10 MG TOTAL) BY MOUTH 4 (FOUR) TIMES DAILY. 120 tablet 0  . mirtazapine (REMERON) 7.5 MG tablet Take 7.5 mg by mouth at bedtime.    . Multiple Vitamins-Minerals (MULTIVITAMIN ADULT PO) Take by mouth.    Marland Kitchen omeprazole (PRILOSEC) 20 MG capsule TAKE 1 CAPSULE EVERY DAY 90 capsule 2  . ondansetron (ZOFRAN) 8 MG tablet TAKE 1 TABLET TWO TIMES DAILY AS NEEDED FOR REFRACTORY NAUSEA OR VOMITING.  120 tablet 0  . ondansetron (ZOFRAN) 8 MG tablet Take 1 tablet (8 mg total) by mouth 2 (two) times daily as needed for refractory nausea / vomiting. Start on day 3 after carboplatin chemo. 30 tablet 2  . potassium chloride SA (K-DUR,KLOR-CON) 20 MEQ tablet Take 20 mEq 2 (two) times daily by mouth.    . diphenoxylate-atropine (LOMOTIL) 2.5-0.025 MG tablet Take 1 tablet 4 (four) times daily as needed by mouth for diarrhea or loose stools. (Patient not taking: Reported on  12/25/2017) 120 tablet 0  . HYDROcodone-acetaminophen (NORCO) 5-325 MG tablet Take 1 tablet by mouth every 4 (four) hours as needed for moderate pain. (Patient not taking: Reported on 12/25/2017) 60 tablet 0  . prochlorperazine (COMPAZINE) 10 MG tablet Take 1 tablet (10 mg total) by mouth every 6 (six) hours as needed (Nausea or vomiting). (Patient not taking: Reported on 12/25/2017) 60 tablet 2   No current facility-administered medications for this visit.    Facility-Administered Medications Ordered in Other Visits  Medication Dose Route Frequency Provider Last Rate Last Dose  . heparin lock flush 100 unit/mL  500 Units Intravenous Once [name], MD      . sodium chloride flush (NS) 0.9 % injection 10 mL  10 mL Intravenous Once [name], MD        OBJECTIVE: Vitals:  12/25/17 1041  BP: (!) 90/58  Pulse: 88  Resp: 18  Temp: (!) 95.4 F (35.2 C)     Body mass index is 26.2 kg/m.    ECOG FS:1 - Symptomatic but completely ambulatory  General: Well-developed, well-nourished, no acute distress.  Sitting in a wheelchair. Eyes: Pink conjunctiva, anicteric sclera. HEENT: Normocephalic, moist mucous membranes. Lungs: Clear to auscultation bilaterally. Heart: Regular rate and rhythm. No rubs, murmurs, or gallops. Abdomen: Soft, nontender, nondistended. No organomegaly noted, normoactive bowel sounds. Musculoskeletal: No edema, cyanosis, or clubbing. Neuro: Alert, answering all questions appropriately. Cranial nerves grossly intact. Skin: No rashes or petechiae noted. Psych: Normal affect.  LAB RESULTS:  Lab Results  Component Value Date   NA 137 12/25/2017   K 3.4 (L) 12/25/2017   CL 101 12/25/2017   CO2 25 12/25/2017   GLUCOSE 161 (H) 12/25/2017   BUN 24 (H) 12/25/2017   CREATININE 0.97 12/25/2017   CALCIUM 8.5 (L) 12/25/2017   PROT 6.0 (L) 12/25/2017   ALBUMIN 2.8 (L) 12/25/2017   AST 21 12/25/2017   ALT 11 12/25/2017   ALKPHOS 85 12/25/2017   BILITOT  0.6 12/25/2017   GFRNONAA 53 (L) 12/25/2017   GFRAA >60 12/25/2017    Lab Results  Component Value Date   WBC 2.2 (L) 12/25/2017   NEUTROABS 1.5 (L) 12/25/2017   HGB 8.5 (L) 12/25/2017   HCT 27.8 (L) 12/25/2017   MCV 76.8 (L) 12/25/2017   PLT 44 (L) 12/25/2017     STUDIES: No results found.  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  PLAN:    1. 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  She is noted to have a new large right adrenal metastatic lesion as well as a new central pancreatic body metastatic lesion.  Left adrenal lesion is bigger.  The dominant left lower paraesophageal mass has also increased in size.  Beryle Flock has been discontinued.  Previously, hospice and end-of-life care were discussed, but patient is not ready to make this decision.  Despite her difficulty with chemotherapy previously, patient has agreed to attempt a second line therapy using carboplatin and gemcitabine.  She has refused Taxol or Taxotere.  Delay cycle 1, day 8 of carboplatin and gemcitabine, gemcitabine only, again today secondary persistent pancytopenia.  Return to clinic in 1 week for further evaluation and reconsideration of treatment.   2.  Difficulty swallowing: Improving.  Possibly secondary to paraesophageal mass.  Continue daily XRT, completing on January 11, 2018.   3.  Flank/chest pain: Improved.  Possibly related to progressive malignancy.  Continue current narcotic regimen as prescribed.   4.  Renal insufficiency: Patient's creatinine is now within normal limits. 5.  Thrombocytopenia: Secondary to chemotherapy, delay treatment as above. 6.  Anemia: Patient's hemoglobin is decreased, but stable.  Monitor. 7.  Neutropenia: Secondary to chemotherapy.  Delay treatment as above. 8.  Hypotension: Patient will receive 1 L of IV fluids today.  Patient expressed  understanding and was in agreement with this plan. She also understands that She can call clinic at any time with any questions, concerns, or complaints.   Cancer Staging Squamous cell carcinoma lung, left ([place]) Staging form: Lung, AJCC 8th Edition - Clinical stage from 12/13/2016: Stage IVA (cT4, cN0, cM1a) - Signed by [name], MD on 12/13/2016   [name], MD   12/26/2017 12:53 PM

## 2017-12-24 ENCOUNTER — Other Ambulatory Visit: Payer: Self-pay | Admitting: Oncology

## 2017-12-24 ENCOUNTER — Ambulatory Visit: Payer: Medicare HMO

## 2017-12-24 ENCOUNTER — Ambulatory Visit
Admission: RE | Admit: 2017-12-24 | Discharge: 2017-12-24 | Disposition: A | Discharge status: Home / Self Care | Payer: Medicare HMO | Source: Ambulatory Visit | Attending: Radiation Oncology | Admitting: Radiation Oncology

## 2017-12-24 DIAGNOSIS — Z510 Encounter for antineoplastic radiation therapy: Secondary | ICD-10-CM | POA: Diagnosis not present

## 2017-12-25 ENCOUNTER — Other Ambulatory Visit: Payer: Self-pay

## 2017-12-25 ENCOUNTER — Inpatient Hospital Stay: Payer: Medicare HMO

## 2017-12-25 ENCOUNTER — Ambulatory Visit
Admission: RE | Admit: 2017-12-25 | Discharge: 2017-12-25 | Disposition: A | Discharge status: Home / Self Care | Payer: Medicare HMO | Source: Ambulatory Visit | Attending: Radiation Oncology | Admitting: Radiation Oncology

## 2017-12-25 ENCOUNTER — Inpatient Hospital Stay (HOSPITAL_BASED_OUTPATIENT_CLINIC_OR_DEPARTMENT_OTHER): Payer: Medicare HMO | Admitting: Oncology

## 2017-12-25 VITALS — BP 90/58 | HR 88 | Temp 95.4°F | Resp 18 | Wt 147.9 lb

## 2017-12-25 DIAGNOSIS — Z7901 Long term (current) use of anticoagulants: Secondary | ICD-10-CM

## 2017-12-25 DIAGNOSIS — R530 Neoplastic (malignant) related fatigue: Secondary | ICD-10-CM

## 2017-12-25 DIAGNOSIS — R1310 Dysphagia, unspecified: Secondary | ICD-10-CM

## 2017-12-25 DIAGNOSIS — E860 Dehydration: Secondary | ICD-10-CM

## 2017-12-25 DIAGNOSIS — R079 Chest pain, unspecified: Secondary | ICD-10-CM

## 2017-12-25 DIAGNOSIS — Z510 Encounter for antineoplastic radiation therapy: Secondary | ICD-10-CM | POA: Diagnosis not present

## 2017-12-25 DIAGNOSIS — R109 Unspecified abdominal pain: Secondary | ICD-10-CM

## 2017-12-25 DIAGNOSIS — Z8249 Family history of ischemic heart disease and other diseases of the circulatory system: Secondary | ICD-10-CM

## 2017-12-25 DIAGNOSIS — D701 Agranulocytosis secondary to cancer chemotherapy: Secondary | ICD-10-CM

## 2017-12-25 DIAGNOSIS — M549 Dorsalgia, unspecified: Secondary | ICD-10-CM

## 2017-12-25 DIAGNOSIS — Z87891 Personal history of nicotine dependence: Secondary | ICD-10-CM

## 2017-12-25 DIAGNOSIS — C3492 Malignant neoplasm of unspecified part of left bronchus or lung: Secondary | ICD-10-CM

## 2017-12-25 DIAGNOSIS — Z79899 Other long term (current) drug therapy: Secondary | ICD-10-CM

## 2017-12-25 DIAGNOSIS — E46 Unspecified protein-calorie malnutrition: Secondary | ICD-10-CM

## 2017-12-25 DIAGNOSIS — I951 Orthostatic hypotension: Secondary | ICD-10-CM | POA: Diagnosis not present

## 2017-12-25 DIAGNOSIS — R531 Weakness: Secondary | ICD-10-CM

## 2017-12-25 DIAGNOSIS — R0602 Shortness of breath: Secondary | ICD-10-CM

## 2017-12-25 DIAGNOSIS — N289 Disorder of kidney and ureter, unspecified: Secondary | ICD-10-CM

## 2017-12-25 DIAGNOSIS — D6959 Other secondary thrombocytopenia: Secondary | ICD-10-CM

## 2017-12-25 DIAGNOSIS — I480 Paroxysmal atrial fibrillation: Secondary | ICD-10-CM

## 2017-12-25 LAB — COMPREHENSIVE METABOLIC PANEL
ALT: 11 U/L (ref 0–44)
ANION GAP: 11 (ref 5–15)
AST: 21 U/L (ref 15–41)
Albumin: 2.8 g/dL — ABNORMAL LOW (ref 3.5–5.0)
Alkaline Phosphatase: 85 U/L (ref 38–126)
BUN: 24 mg/dL — ABNORMAL HIGH (ref 8–23)
CALCIUM: 8.5 mg/dL — AB (ref 8.9–10.3)
CHLORIDE: 101 mmol/L (ref 98–111)
CO2: 25 mmol/L (ref 22–32)
CREATININE: 0.97 mg/dL (ref 0.44–1.00)
GFR, EST NON AFRICAN AMERICAN: 53 mL/min — AB (ref >60)
Glucose, Bld: 161 mg/dL — ABNORMAL HIGH (ref 70–99)
Potassium: 3.4 mmol/L — ABNORMAL LOW (ref 3.5–5.1)
SODIUM: 137 mmol/L (ref 135–145)
Total Bilirubin: 0.6 mg/dL (ref 0.3–1.2)
Total Protein: 6 g/dL — ABNORMAL LOW (ref 6.5–8.1)

## 2017-12-25 LAB — CBC WITH DIFFERENTIAL/PLATELET
Abs Immature Granulocytes: 0.16 10*3/uL — ABNORMAL HIGH (ref 0.00–0.07)
BASOS ABS: 0 10*3/uL (ref 0.0–0.1)
Basophils Relative: 0 %
EOS ABS: 0 10*3/uL (ref 0.0–0.5)
Eosinophils Relative: 1 %
HEMATOCRIT: 27.8 % — AB (ref 36.0–46.0)
HEMOGLOBIN: 8.5 g/dL — AB (ref 12.0–15.0)
IMMATURE GRANULOCYTES: 7 %
LYMPHS ABS: 0.3 10*3/uL — AB (ref 0.7–4.0)
LYMPHS PCT: 14 %
MCH: 23.5 pg — AB (ref 26.0–34.0)
MCHC: 30.6 g/dL (ref 30.0–36.0)
MCV: 76.8 fL — ABNORMAL LOW (ref 80.0–100.0)
Monocytes Absolute: 0.2 10*3/uL (ref 0.1–1.0)
Monocytes Relative: 9 %
NEUTROS PCT: 69 %
NRBC: 0 % (ref 0.0–0.2)
Neutro Abs: 1.5 10*3/uL — ABNORMAL LOW (ref 1.7–7.7)
Platelets: 44 10*3/uL — ABNORMAL LOW (ref 150–400)
RBC: 3.62 MIL/uL — AB (ref 3.87–5.11)
RDW: 15.1 % (ref 11.5–15.5)
Smear Review: DECREASED
WBC: 2.2 10*3/uL — AB (ref 4.0–10.5)

## 2017-12-25 MED ORDER — SODIUM CHLORIDE 0.9 % IV SOLN: 10.0000 mg | Freq: Once | INTRAVENOUS | Status: DC

## 2017-12-25 MED ORDER — DEXAMETHASONE SODIUM PHOSPHATE 10 MG/ML IJ SOLN
10.0000 mg | Freq: Once | INTRAMUSCULAR | Status: AC
  Administered 2017-12-25: 10 mg via INTRAVENOUS
  Filled 2017-12-25: qty 1

## 2017-12-25 MED ORDER — HEPARIN SOD (PORK) LOCK FLUSH 100 UNIT/ML IV SOLN
INTRAVENOUS | Status: AC
  Filled 2017-12-25: qty 5

## 2017-12-25 MED ORDER — SODIUM CHLORIDE 0.9 % IV SOLN
Freq: Once | INTRAVENOUS | Status: AC
  Administered 2017-12-25: 12:00:00 via INTRAVENOUS
  Filled 2017-12-25: qty 250

## 2017-12-25 MED ORDER — SODIUM CHLORIDE 0.9% FLUSH
10.0000 mL | Freq: Once | INTRAVENOUS | Status: AC
  Administered 2017-12-25: 10 mL via INTRAVENOUS
  Filled 2017-12-25: qty 10

## 2017-12-25 MED ORDER — HEPARIN SOD (PORK) LOCK FLUSH 100 UNIT/ML IV SOLN
500.0000 [IU] | Freq: Once | INTRAVENOUS | Status: AC
  Administered 2017-12-25: 500 [IU] via INTRAVENOUS

## 2017-12-25 NOTE — Progress Notes (Signed)
Here for follow up. Today feeling good but " extremely tired "  BP 90/58 p 88 , standing 84/58 p 103   Appetite much better per pt and family member.

## 2017-12-26 ENCOUNTER — Ambulatory Visit
Admission: RE | Admit: 2017-12-26 | Discharge: 2017-12-26 | Disposition: A | Discharge status: Home / Self Care | Payer: Medicare HMO | Source: Ambulatory Visit | Attending: Radiation Oncology | Admitting: Radiation Oncology

## 2017-12-26 DIAGNOSIS — Z510 Encounter for antineoplastic radiation therapy: Secondary | ICD-10-CM | POA: Diagnosis not present

## 2017-12-27 ENCOUNTER — Ambulatory Visit
Admission: RE | Admit: 2017-12-27 | Discharge: 2017-12-27 | Disposition: A | Discharge status: Home / Self Care | Payer: Medicare HMO | Source: Ambulatory Visit | Attending: Radiation Oncology | Admitting: Radiation Oncology

## 2017-12-27 DIAGNOSIS — Z510 Encounter for antineoplastic radiation therapy: Secondary | ICD-10-CM | POA: Diagnosis not present

## 2017-12-28 ENCOUNTER — Ambulatory Visit
Admission: RE | Admit: 2017-12-28 | Discharge: 2017-12-28 | Disposition: A | Discharge status: Home / Self Care | Payer: Medicare HMO | Source: Ambulatory Visit | Attending: Radiation Oncology | Admitting: Radiation Oncology

## 2017-12-28 DIAGNOSIS — Z510 Encounter for antineoplastic radiation therapy: Secondary | ICD-10-CM | POA: Diagnosis not present

## 2017-12-29 ENCOUNTER — Encounter: Payer: Self-pay | Admitting: Emergency Medicine

## 2017-12-29 ENCOUNTER — Other Ambulatory Visit: Payer: Self-pay

## 2017-12-29 ENCOUNTER — Observation Stay
Admission: EM | Admit: 2017-12-29 | Discharge: 2017-12-30 | Disposition: A | Discharge status: Home / Self Care | Payer: Medicare HMO | Attending: Internal Medicine | Admitting: Internal Medicine

## 2017-12-29 ENCOUNTER — Emergency Department: Payer: Medicare HMO

## 2017-12-29 DIAGNOSIS — B9620 Unspecified Escherichia coli [E. coli] as the cause of diseases classified elsewhere: Secondary | ICD-10-CM | POA: Diagnosis not present

## 2017-12-29 DIAGNOSIS — R2681 Unsteadiness on feet: Secondary | ICD-10-CM | POA: Insufficient documentation

## 2017-12-29 DIAGNOSIS — Z79899 Other long term (current) drug therapy: Secondary | ICD-10-CM | POA: Insufficient documentation

## 2017-12-29 DIAGNOSIS — E872 Acidosis: Secondary | ICD-10-CM | POA: Insufficient documentation

## 2017-12-29 DIAGNOSIS — Z9221 Personal history of antineoplastic chemotherapy: Secondary | ICD-10-CM | POA: Insufficient documentation

## 2017-12-29 DIAGNOSIS — Z8719 Personal history of other diseases of the digestive system: Secondary | ICD-10-CM | POA: Diagnosis not present

## 2017-12-29 DIAGNOSIS — D509 Iron deficiency anemia, unspecified: Secondary | ICD-10-CM | POA: Insufficient documentation

## 2017-12-29 DIAGNOSIS — I959 Hypotension, unspecified: Secondary | ICD-10-CM | POA: Diagnosis not present

## 2017-12-29 DIAGNOSIS — Z881 Allergy status to other antibiotic agents status: Secondary | ICD-10-CM | POA: Diagnosis not present

## 2017-12-29 DIAGNOSIS — C7970 Secondary malignant neoplasm of unspecified adrenal gland: Secondary | ICD-10-CM | POA: Insufficient documentation

## 2017-12-29 DIAGNOSIS — Z66 Do not resuscitate: Secondary | ICD-10-CM | POA: Diagnosis not present

## 2017-12-29 DIAGNOSIS — N390 Urinary tract infection, site not specified: Secondary | ICD-10-CM | POA: Diagnosis not present

## 2017-12-29 DIAGNOSIS — Z923 Personal history of irradiation: Secondary | ICD-10-CM | POA: Diagnosis not present

## 2017-12-29 DIAGNOSIS — E860 Dehydration: Secondary | ICD-10-CM | POA: Diagnosis present

## 2017-12-29 DIAGNOSIS — Z882 Allergy status to sulfonamides status: Secondary | ICD-10-CM | POA: Insufficient documentation

## 2017-12-29 DIAGNOSIS — J910 Malignant pleural effusion: Secondary | ICD-10-CM | POA: Insufficient documentation

## 2017-12-29 DIAGNOSIS — C3492 Malignant neoplasm of unspecified part of left bronchus or lung: Secondary | ICD-10-CM | POA: Diagnosis not present

## 2017-12-29 DIAGNOSIS — Z9181 History of falling: Secondary | ICD-10-CM | POA: Insufficient documentation

## 2017-12-29 DIAGNOSIS — Z7901 Long term (current) use of anticoagulants: Secondary | ICD-10-CM | POA: Insufficient documentation

## 2017-12-29 DIAGNOSIS — F329 Major depressive disorder, single episode, unspecified: Secondary | ICD-10-CM | POA: Insufficient documentation

## 2017-12-29 DIAGNOSIS — I480 Paroxysmal atrial fibrillation: Secondary | ICD-10-CM | POA: Insufficient documentation

## 2017-12-29 DIAGNOSIS — Z87891 Personal history of nicotine dependence: Secondary | ICD-10-CM | POA: Diagnosis not present

## 2017-12-29 DIAGNOSIS — J449 Chronic obstructive pulmonary disease, unspecified: Secondary | ICD-10-CM | POA: Diagnosis not present

## 2017-12-29 LAB — CBC WITH DIFFERENTIAL/PLATELET
ABS IMMATURE GRANULOCYTES: 0.39 10*3/uL — AB (ref 0.00–0.07)
BASOS PCT: 0 %
Basophils Absolute: 0 10*3/uL (ref 0.0–0.1)
Eosinophils Absolute: 0 10*3/uL (ref 0.0–0.5)
Eosinophils Relative: 0 %
HEMATOCRIT: 29.5 % — AB (ref 36.0–46.0)
Hemoglobin: 9 g/dL — ABNORMAL LOW (ref 12.0–15.0)
IMMATURE GRANULOCYTES: 5 %
Lymphocytes Relative: 3 %
Lymphs Abs: 0.2 10*3/uL — ABNORMAL LOW (ref 0.7–4.0)
MCH: 23.7 pg — ABNORMAL LOW (ref 26.0–34.0)
MCHC: 30.5 g/dL (ref 30.0–36.0)
MCV: 77.8 fL — AB (ref 80.0–100.0)
MONOS PCT: 8 %
Monocytes Absolute: 0.5 10*3/uL (ref 0.1–1.0)
NEUTROS ABS: 6.1 10*3/uL (ref 1.7–7.7)
Neutrophils Relative %: 84 %
PLATELETS: 112 10*3/uL — AB (ref 150–400)
RBC: 3.79 MIL/uL — ABNORMAL LOW (ref 3.87–5.11)
RDW: 17.3 % — AB (ref 11.5–15.5)
WBC: 7.2 10*3/uL (ref 4.0–10.5)
nRBC: 0 % (ref 0.0–0.2)

## 2017-12-29 LAB — TYPE AND SCREEN
ABO/RH(D): O POS
Antibody Screen: NEGATIVE

## 2017-12-29 LAB — URINALYSIS, COMPLETE (UACMP) WITH MICROSCOPIC
Bilirubin Urine: NEGATIVE
GLUCOSE, UA: NEGATIVE mg/dL
Ketones, ur: NEGATIVE mg/dL
NITRITE: NEGATIVE
PH: 5 (ref 5.0–8.0)
Protein, ur: NEGATIVE mg/dL
Specific Gravity, Urine: 1.008 (ref 1.005–1.030)

## 2017-12-29 LAB — BASIC METABOLIC PANEL
Anion gap: 10 (ref 5–15)
BUN: 26 mg/dL — AB (ref 8–23)
CALCIUM: 8.5 mg/dL — AB (ref 8.9–10.3)
CO2: 27 mmol/L (ref 22–32)
Chloride: 101 mmol/L (ref 98–111)
Creatinine, Ser: 0.94 mg/dL (ref 0.44–1.00)
GFR calc Af Amer: >60 mL/min (ref >60)
GFR calc non Af Amer: 55 mL/min — ABNORMAL LOW (ref >60)
GLUCOSE: 171 mg/dL — AB (ref 70–99)
Potassium: 3.4 mmol/L — ABNORMAL LOW (ref 3.5–5.1)
Sodium: 138 mmol/L (ref 135–145)

## 2017-12-29 LAB — LACTIC ACID, PLASMA
Lactic Acid, Venous: 3.9 mmol/L (ref 0.5–1.9)
Lactic Acid, Venous: 3.3 mmol/L (ref 0.5–1.9)

## 2017-12-29 MED ORDER — ONDANSETRON HCL 4 MG/2ML IJ SOLN: 4.0000 mg | Freq: Four times a day (QID) | INTRAMUSCULAR | Status: DC | PRN

## 2017-12-29 MED ORDER — DILTIAZEM HCL ER COATED BEADS 120 MG PO CP24
120.0000 mg | ORAL_CAPSULE | Freq: Every day | ORAL | Status: DC
  Administered 2017-12-30: 13:00:00 120 mg via ORAL
  Filled 2017-12-29: qty 1

## 2017-12-29 MED ORDER — FENTANYL 25 MCG/HR TD PT72
25.0000 ug | MEDICATED_PATCH | TRANSDERMAL | Status: DC
  Administered 2017-12-30: 25 ug via TRANSDERMAL
  Filled 2017-12-29: qty 1

## 2017-12-29 MED ORDER — AMIODARONE HCL 200 MG PO TABS
200.0000 mg | ORAL_TABLET | Freq: Two times a day (BID) | ORAL | Status: DC
  Administered 2017-12-29 – 2017-12-30 (×2): 200 mg via ORAL
  Filled 2017-12-29 (×2): qty 1

## 2017-12-29 MED ORDER — PANTOPRAZOLE SODIUM 40 MG PO TBEC
40.0000 mg | DELAYED_RELEASE_TABLET | Freq: Every day | ORAL | Status: DC
  Administered 2017-12-30: 13:00:00 40 mg via ORAL
  Filled 2017-12-29: qty 1

## 2017-12-29 MED ORDER — SODIUM CHLORIDE 0.9 % IV SOLN
1.0000 g | INTRAVENOUS | Status: DC
  Administered 2017-12-29: 1 g via INTRAVENOUS
  Filled 2017-12-29 (×2): qty 10

## 2017-12-29 MED ORDER — MIRTAZAPINE 15 MG PO TABS
7.5000 mg | ORAL_TABLET | Freq: Every day | ORAL | Status: DC
  Administered 2017-12-29: 7.5 mg via ORAL
  Filled 2017-12-29: qty 1

## 2017-12-29 MED ORDER — SODIUM CHLORIDE 0.9 % IV SOLN
INTRAVENOUS | Status: DC
  Administered 2017-12-30: via INTRAVENOUS

## 2017-12-29 MED ORDER — POTASSIUM CHLORIDE CRYS ER 20 MEQ PO TBCR
40.0000 meq | EXTENDED_RELEASE_TABLET | Freq: Once | ORAL | Status: AC
  Administered 2017-12-29: 40 meq via ORAL
  Filled 2017-12-29: qty 2

## 2017-12-29 MED ORDER — SODIUM CHLORIDE 0.9 % IV BOLUS (SEPSIS)
1000.0000 mL | Freq: Once | INTRAVENOUS | Status: AC
  Administered 2017-12-29: 1000 mL via INTRAVENOUS

## 2017-12-29 MED ORDER — FLUTICASONE PROPIONATE 50 MCG/ACT NA SUSP
2.0000 | Freq: Every day | NASAL | Status: DC
  Filled 2017-12-29: qty 16

## 2017-12-29 MED ORDER — DEXAMETHASONE 4 MG PO TABS
4.0000 mg | ORAL_TABLET | Freq: Every day | ORAL | Status: DC
  Administered 2017-12-30: 4 mg via ORAL
  Filled 2017-12-29: qty 1

## 2017-12-29 MED ORDER — LORATADINE 10 MG PO TABS
10.0000 mg | ORAL_TABLET | Freq: Every day | ORAL | Status: DC
  Administered 2017-12-30: 10 mg via ORAL
  Filled 2017-12-29: qty 1

## 2017-12-29 MED ORDER — ACETAMINOPHEN 325 MG PO TABS: 650.0000 mg | ORAL_TABLET | Freq: Four times a day (QID) | ORAL | Status: DC | PRN

## 2017-12-29 MED ORDER — APIXABAN 5 MG PO TABS
5.0000 mg | ORAL_TABLET | Freq: Two times a day (BID) | ORAL | Status: DC
  Administered 2017-12-29 – 2017-12-30 (×2): 5 mg via ORAL
  Filled 2017-12-29 (×2): qty 1

## 2017-12-29 MED ORDER — SODIUM CHLORIDE 0.9 % IV BOLUS
1000.0000 mL | Freq: Once | INTRAVENOUS | Status: AC
  Administered 2017-12-29: 1000 mL via INTRAVENOUS

## 2017-12-29 MED ORDER — ESCITALOPRAM OXALATE 10 MG PO TABS
5.0000 mg | ORAL_TABLET | Freq: Every day | ORAL | Status: DC
  Administered 2017-12-30: 13:00:00 5 mg via ORAL
  Filled 2017-12-29: qty 0.5

## 2017-12-29 MED ORDER — ONDANSETRON HCL 4 MG PO TABS: 4.0000 mg | ORAL_TABLET | Freq: Four times a day (QID) | ORAL | Status: DC | PRN

## 2017-12-29 MED ORDER — PROCHLORPERAZINE MALEATE 10 MG PO TABS
10.0000 mg | ORAL_TABLET | Freq: Four times a day (QID) | ORAL | Status: DC | PRN
  Filled 2017-12-29: qty 1

## 2017-12-29 MED ORDER — POLYETHYLENE GLYCOL 3350 17 G PO PACK: 17.0000 g | PACK | Freq: Every day | ORAL | Status: DC | PRN

## 2017-12-29 MED ORDER — METOCLOPRAMIDE HCL 5 MG PO TABS
10.0000 mg | ORAL_TABLET | Freq: Four times a day (QID) | ORAL | Status: DC
  Administered 2017-12-29 – 2017-12-30 (×2): 10 mg via ORAL
  Filled 2017-12-29 (×2): qty 2

## 2017-12-29 MED ORDER — ACETAMINOPHEN 650 MG RE SUPP: 650.0000 mg | Freq: Four times a day (QID) | RECTAL | Status: DC | PRN

## 2017-12-29 NOTE — Progress Notes (Signed)
Family Meeting Note  Advance Directive:yes  Today a meeting took place with the Patient.  Patient is able to participate.  The following clinical team members were present during this meeting:MD  The following were discussed:Patient's diagnosis: , Patient's progosis: Unable to determine and Goals for treatment: DNR  Additional follow-up to be provided: prn  Time spent during discussion:20 minutes  Evette Doffing, MD

## 2017-12-29 NOTE — Progress Notes (Signed)
CODE SEPSIS - PHARMACY COMMUNICATION  **Broad Spectrum Antibiotics should be administered within 1 hour of Sepsis diagnosis**  Time Code Sepsis Called/Page Received: 1825  Antibiotics Ordered: Rocephin  Time of 1st antibiotic administration: 1841  Additional action taken by pharmacy: none required  If necessary, Name of Provider/Nurse Contacted: N/A    Dallie Piles ,PharmD Clinical Pharmacist  12/29/2017  6:53 PM

## 2017-12-29 NOTE — ED Notes (Signed)
Lactic 3.9 , MD McShane made aware

## 2017-12-29 NOTE — H&P (Addendum)
[place] [facility] [place] NAME: [name]    MR#:  [id number]  DATE OF BIRTH:  01-28-1933  DATE OF ADMISSION:  12/29/2017  PRIMARY CARE PHYSICIAN: [name], MD   REQUESTING/REFERRING PHYSICIAN: Charlotte Crumb, MD  CHIEF COMPLAINT:   Chief Complaint  Patient presents with  . Hypotension    HISTORY OF PRESENT ILLNESS:  [name]  is a 84 y.o. female with a known history of COPD, paroxysmal a-fib, and lung cancer currently undergoing radiation who presented to the ED with hypotension starting this morning. Her niece (who she lives with) checked her blood pressure today and it was 89/65 with sitting and 65/44 with standing. She is currently undergoing radiation for metastatic lung cancer. She has been having issues with hypotension at home and receives IVFs 2-3 times per week at the oncology office. She denies any chest pain or shortness of breath. No dysuria or urinary urgency. She endorses urinary frequency and malodorous urine. No fevers or chills.  PAST MEDICAL HISTORY:   Past Medical History:  Diagnosis Date  . COPD (chronic obstructive pulmonary disease) ([place])   . GU (gastric peptic ulcer)   . Lung cancer ([place])   . Malignant pleural effusion   . PAF (paroxysmal atrial fibrillation) ([place]) 10/2016   a. 10/2016 post thoracentesis ; b. 10/2016 Echo: EF 65-70%, mild LVH;  c. Amio/Eliquis initiated (CHA2DSVASc = 3).  . Pneumonia   . Wrist fracture 1993   bilateral    PAST SURGICAL HISTORY:   Past Surgical History:  Procedure Laterality Date  . ABDOMINAL HYSTERECTOMY    . CATARACT EXTRACTION W/ INTRAOCULAR LENS  IMPLANT, BILATERAL    . CHEST TUBE INSERTION Left 12/18/2016   Procedure: CHEST TUBE INSERTION;  Surgeon: [name], MD;  Location: [facility] ORS;  Service: General;  Laterality: Left;  . CHOLECYSTECTOMY    . CLOSED REDUCTION WRIST FRACTURE Left 01/04/2017   Procedure: CLOSED REDUCTION WRIST;  Surgeon: Thornton Park, MD;  Location: [facility] ORS;  Service: Orthopedics;  Laterality: Left;  . PORTACATH PLACEMENT Left 12/18/2016   Procedure: INSERTION PORT-A-CATH;  Surgeon: [name], MD;  Location: [facility] ORS;  Service: General;  Laterality: Left;  . REMOVAL OF PLEURAL DRAINAGE CATHETER N/A 01/02/2017   Procedure: REMOVAL OF PLEURAL DRAINAGE CATHETER;  Surgeon: [name], MD;  Location: [facility] ORS;  Service: Thoracic;  Laterality: N/A;  . TONSILLECTOMY      SOCIAL HISTORY:   Social History   Tobacco Use  . Smoking status: Former Smoker    Packs/day: 1.00    Years: 68.00    Pack years: 68.00    Last attempt to quit: 11/06/2016    Years since quitting: 1.1  . Smokeless tobacco: Never Used  Substance Use Topics  . Alcohol use: No    FAMILY HISTORY:   Family History  Problem Relation Age of Onset  . Hypertension Father   . Heart disease Father   . Heart attack Father   . Congestive Heart Failure Mother   . Stroke Mother   . Atrial fibrillation Sister     DRUG ALLERGIES:   Allergies  Allergen Reactions  . Sulfa Antibiotics Other (See Comments)    Seizures   . Levaquin [Levofloxacin] Hives and Itching    REVIEW OF SYSTEMS:   Review of Systems  Constitutional: Negative for chills and fever.  HENT: Positive for ear pain. Negative for sinus pain and sore throat.   Eyes: Negative for blurred vision  and double vision.  Respiratory: Negative for cough and shortness of breath.   Cardiovascular: Negative for chest pain, palpitations and leg swelling.  Gastrointestinal: Negative for abdominal pain, constipation, diarrhea, nausea and vomiting.  Genitourinary: Positive for frequency. Negative for dysuria, flank pain, hematuria and urgency.  Musculoskeletal: Negative for back pain and neck pain.  Neurological: Negative for dizziness and headaches.  Psychiatric/Behavioral: Negative for depression. The patient is not nervous/anxious.     MEDICATIONS AT HOME:   Prior to Admission  medications   Medication Sig Start Date End Date Taking? Authorizing Provider  amiodarone (PACERONE) 200 MG tablet Take 200 mg by mouth daily. 10/29/17   [provider]  apixaban (ELIQUIS) 5 MG TABS tablet Take 1 tablet (5 mg total) by mouth 2 (two) times daily. 11/16/16   [name], MD  BIOTIN PO Take by mouth daily.    [provider]  cetirizine (ZYRTEC) 10 MG tablet Take 10 mg by mouth daily as needed for allergies.    [provider]  cholecalciferol (VITAMIN D) 1000 units tablet Take 1,000 Units by mouth daily.    [provider]  dexamethasone (DECADRON) 4 MG tablet Take 1 tablet (4 mg total) by mouth daily. 12/18/17   [name], MD  diltiazem (CARDIZEM CD) 120 MG 24 hr capsule Take 1 capsule (120 mg total) by mouth daily. 01/09/17   Dustin Flock, MD  diphenoxylate-atropine (LOMOTIL) 2.5-0.025 MG tablet Take 1 tablet 4 (four) times daily as needed by mouth for diarrhea or loose stools. Patient not taking: Reported on 12/25/2017 02/05/17   Verlon Au, NP  escitalopram (LEXAPRO) 5 MG tablet Take 5 mg by mouth daily.    [provider]  fentaNYL (DURAGESIC - DOSED MCG/HR) 25 MCG/HR patch Place 1 patch (25 mcg total) onto the skin every 3 (three) days. 12/18/17   [name], MD  furosemide (LASIX) 20 MG tablet Take 1 tablet by mouth daily as needed. For EDEMA 10/07/16   [provider]  HYDROcodone-acetaminophen (NORCO) 5-325 MG tablet Take 1 tablet by mouth every 4 (four) hours as needed for moderate pain. Patient not taking: Reported on 12/25/2017 12/11/17   [name], MD  lidocaine-prilocaine (EMLA) cream APPLY TO AFFECTED AREA ONCE 06/06/17   [provider]  metoCLOPramide (REGLAN) 10 MG tablet TAKE 1 TABLET (10 MG TOTAL) BY MOUTH 4 (FOUR) TIMES DAILY. 12/24/17 01/23/18  [name], MD  mirtazapine (REMERON) 7.5 MG tablet Take 7.5 mg by mouth at bedtime.    [provider]  Multiple Vitamins-Minerals (MULTIVITAMIN ADULT PO) Take by mouth.    [provider]  omeprazole (PRILOSEC) 20 MG capsule TAKE 1 CAPSULE EVERY DAY 11/05/17   [name], MD  ondansetron (ZOFRAN) 8 MG tablet TAKE 1 TABLET TWO TIMES DAILY AS NEEDED FOR REFRACTORY NAUSEA OR VOMITING.  04/03/17   [name], MD  ondansetron (ZOFRAN) 8 MG tablet Take 1 tablet (8 mg total) by mouth 2 (two) times daily as needed for refractory nausea / vomiting. Start on day 3 after carboplatin chemo. 12/04/17   [name], MD  potassium chloride SA (K-DUR,KLOR-CON) 20 MEQ tablet Take 20 mEq 2 (two) times daily by mouth.    [provider]  prochlorperazine (COMPAZINE) 10 MG tablet Take 1 tablet (10 mg total) by mouth every 6 (six) hours as needed (Nausea or vomiting). Patient not taking: Reported on 12/25/2017 03/28/17   [name], MD  VITAL SIGNS:  Blood pressure 114/64, pulse 85, temperature 98.2 F (36.8 C), temperature source Oral, resp. rate 20, weight 67.1 kg, SpO2 100 %.  PHYSICAL EXAMINATION:  Physical Exam  GENERAL:  84 y.o.-year-old patient lying in the bed with no acute distress.  EYES: Pupils equal, round, reactive to light and accommodation. No scleral icterus. Extraocular muscles intact.  HEENT: Head atraumatic, normocephalic. Oropharynx and nasopharynx clear. Moist mucous membranes. NECK:  Supple, no jugular venous distention. No thyroid enlargement, no tenderness.  LUNGS: Normal breath sounds bilaterally, no wheezing, rales,rhonchi or crepitation. No use of accessory muscles of respiration.  CARDIOVASCULAR: irregularly irregular rhythm, regular rate, S1, S2 normal. No murmurs, rubs, or gallops.  ABDOMEN: Soft, nontender, nondistended. Bowel sounds present. No organomegaly or mass.  EXTREMITIES: No pedal edema, cyanosis, or clubbing.  NEUROLOGIC: Cranial nerves II through XII are intact. Muscle strength 5/5 in all extremities. Sensation  intact. Gait not checked.  PSYCHIATRIC: The patient is alert and oriented x 3.  SKIN: No obvious rash, lesion, or ulcer.   LABORATORY PANEL:   CBC Recent Labs  Lab 12/29/17 1642  WBC 7.2  HGB 9.0*  HCT 29.5*  PLT 112*   ------------------------------------------------------------------------------------------------------------------  Chemistries  Recent Labs  Lab 12/25/17 1016 12/29/17 1642  NA 137 138  K 3.4* 3.4*  CL 101 101  CO2 25 27  GLUCOSE 161* 171*  BUN 24* 26*  CREATININE 0.97 0.94  CALCIUM 8.5* 8.5*  AST 21  --   ALT 11  --   ALKPHOS 85  --   BILITOT 0.6  --    ------------------------------------------------------------------------------------------------------------------  Cardiac Enzymes No results for input(s): TROPONINI in the last 168 hours. ------------------------------------------------------------------------------------------------------------------  RADIOLOGY:  Dg Chest 2 View  Result Date: 12/29/2017 CLINICAL DATA:  Weakness, stage IV lung cancer. Intermittent hypotension. Currently being treated with chemotherapy and radiation therapy. History of malignant pleural effusion, pneumonia, COPD, chest tube insertion. EXAM: CHEST - 2 VIEW COMPARISON:  Chest x-ray dated 09/27/2017. FINDINGS: Heart size and mediastinal contours are stable. Mild scarring/atelectasis at the LEFT lung base, stable. No pleural effusions seen. LEFT twelfth wall Port-A-Cath in place, stable in position with tip at the level of the upper SVC. No acute or suspicious osseous finding. A destructive lesion was identified in the LEFT fourth rib on earlier PET-CT, but this is not able to be visualized by plain film. IMPRESSION: No active cardiopulmonary disease. No evidence of pneumonia or pulmonary edema. No pleural effusion seen. Electronically Signed   By: Franki Cabot M.D.   On: 12/29/2017 18:08      IMPRESSION AND PLAN:   UTI- UA with trace leukocytes and rare bacteria. No  signs of overt sepsis (no tachycardia, tachypnea, leukocytosis, fever), although could be early sepsis. - continue ceftriaxone - blood and urine cultures pending  Hypotension- chronic due to dysphagia and dehydration, but has worsened today possibly in the setting of UTI. - s/p 2L NS bolus - MIVFs - monitor closely  Lactic acidosis- likely related to dehydration in the setting of UTI. Initial lactic acid 3.9. - fluids as above - recheck lactic acid  Metastatic lung cancer- has a paraesophageal mass and mets to the adrenal glands, currently undergoing radiation. Follows with Dr. Grayland Ormond. - continue home decadron - has onc follow-up appointment scheduled for tues 10/15  Paroxysmal atrial fibrillation- in a-fib, but rate controlled. - continue home amiodarone, diltiazem, and eliquis  Microcytic anemia- hgb stable and close to baseline. - being followed by oncology as an outpatient  Depression- stable - continue home lexapro and remeron  All the records are reviewed and case discussed with ED provider. Management plans discussed with the patient, family and they are in agreement.  CODE STATUS: DNR  TOTAL TIME TAKING CARE OF THIS PATIENT: 45 minutes.    Berna Spare Dao Mearns M.D on 12/29/2017 at 7:26 PM  Between 7am to 6pm - Pager - [phone]  After 6pm go to [email or web address] - Proofreader  [facility]  Office  [phone]  CC: Primary care physician; [name], MD   Note: This dictation was prepared with Dragon dictation along with smaller phrase technology. Any transcriptional errors that result from this process are unintentional.

## 2017-12-29 NOTE — ED Triage Notes (Signed)
Pt to ED via POV, pt family states that pt is patient at the cancer and has stage 4 lund cancer, pts blood pressure drops at times, pt family states that when she stood up today her BP was 65/44, they called cancer center and was advised to come to the ED for "fluids only". Pt is on chemo and radiation, pt unable to take last chemo treatment because her PLT count was too low. Last treatment was 2 weeks ago. Last radiation treatment was yesterday.  Pt is in NAD at this time

## 2017-12-29 NOTE — ED Provider Notes (Addendum)
[facility] Emergency Department Provider Note  ____________________________________________   I have reviewed the triage vital signs and the nursing notes. Where available I have reviewed prior notes and, if possible and indicated, outside hospital notes.    HISTORY  Chief Complaint Hypotension    HPI [name] is a 84 y.o. female who has a history of significant dehydration, lung cancer, who normally gets 2 infusions of fluid to keep up on her fluid needs but only had one last week.  She states that she just feels a little bit lightheaded.  She is thought to be dehydrated by the cancer center where she gets radiation therapy and was sent in.  Patient denies everything else.  No chest pain or shortness of breath no nausea no vomiting no melena no bright blood per rectum no chest pain no headache no fever no chills, no cough no abdominal pain, she does states that she feels a little lightheaded like she does "when I am dehydrated".  He denies any melena or bright red blood per rectum.  No vomiting.  She states that she has a mass that pushes up against her esophagus and makes it hard for her to keep up on her fluids.     Past Medical History:  Diagnosis Date  . COPD (chronic obstructive pulmonary disease) ([place])   . GU (gastric peptic ulcer)   . Lung cancer ([place])   . Malignant pleural effusion   . PAF (paroxysmal atrial fibrillation) ([place]) 10/2016   a. 10/2016 post thoracentesis ; b. 10/2016 Echo: EF 65-70%, mild LVH;  c. Amio/Eliquis initiated (CHA2DSVASc = 3).  . Pneumonia   . Wrist fracture 1993   bilateral    Patient Active Problem List   Diagnosis Date Noted  . A-fib ([place]) 01/05/2017  . Chest pain 01/01/2017  . Cellulitis of trunk   . Lung cancer ([place]) 12/18/2016  . Goals of care, counseling/discussion 12/17/2016  . Squamous cell carcinoma lung, left ([place]) 12/12/2016  . Atrial fibrillation ([place])   . SOB (shortness of breath) 11/12/2016  .  Non-pressure chronic ulcer of right thigh with fat layer exposed ([place]) 03/16/2015    Past Surgical History:  Procedure Laterality Date  . ABDOMINAL HYSTERECTOMY    . CATARACT EXTRACTION W/ INTRAOCULAR LENS  IMPLANT, BILATERAL    . CHEST TUBE INSERTION Left 12/18/2016   Procedure: CHEST TUBE INSERTION;  Surgeon: [name], MD;  Location: [facility] ORS;  Service: General;  Laterality: Left;  . CHOLECYSTECTOMY    . CLOSED REDUCTION WRIST FRACTURE Left 01/04/2017   Procedure: CLOSED REDUCTION WRIST;  Surgeon: Thornton Park, MD;  Location: [facility] ORS;  Service: Orthopedics;  Laterality: Left;  . PORTACATH PLACEMENT Left 12/18/2016   Procedure: INSERTION PORT-A-CATH;  Surgeon: [name], MD;  Location: [facility] ORS;  Service: General;  Laterality: Left;  . REMOVAL OF PLEURAL DRAINAGE CATHETER N/A 01/02/2017   Procedure: REMOVAL OF PLEURAL DRAINAGE CATHETER;  Surgeon: [name], MD;  Location: [facility] ORS;  Service: Thoracic;  Laterality: N/A;  . TONSILLECTOMY      Prior to Admission medications   Medication Sig Start Date End Date Taking? Authorizing Provider  amiodarone (PACERONE) 200 MG tablet Take 200 mg by mouth daily. 10/29/17   [provider]  apixaban (ELIQUIS) 5 MG TABS tablet Take 1 tablet (5 mg total) by mouth 2 (two) times daily. 11/16/16   [name], MD  BIOTIN PO Take by mouth daily.    [provider]  cetirizine (ZYRTEC) 10  MG tablet Take 10 mg by mouth daily as needed for allergies.    [provider]  cholecalciferol (VITAMIN D) 1000 units tablet Take 1,000 Units by mouth daily.    [provider]  dexamethasone (DECADRON) 4 MG tablet Take 1 tablet (4 mg total) by mouth daily. 12/18/17   [name], MD  diltiazem (CARDIZEM CD) 120 MG 24 hr capsule Take 1 capsule (120 mg total) by mouth daily. 01/09/17   Dustin Flock, MD  diphenoxylate-atropine (LOMOTIL) 2.5-0.025 MG tablet Take 1 tablet 4 (four) times daily as needed by  mouth for diarrhea or loose stools. Patient not taking: Reported on 12/25/2017 02/05/17   Verlon Au, NP  escitalopram (LEXAPRO) 5 MG tablet Take 5 mg by mouth daily.    [provider]  fentaNYL (DURAGESIC - DOSED MCG/HR) 25 MCG/HR patch Place 1 patch (25 mcg total) onto the skin every 3 (three) days. 12/18/17   [name], MD  furosemide (LASIX) 20 MG tablet Take 1 tablet by mouth daily as needed. For EDEMA 10/07/16   [provider]  HYDROcodone-acetaminophen (NORCO) 5-325 MG tablet Take 1 tablet by mouth every 4 (four) hours as needed for moderate pain. Patient not taking: Reported on 12/25/2017 12/11/17   [name], MD  lidocaine-prilocaine (EMLA) cream APPLY TO AFFECTED AREA ONCE 06/06/17   [provider]  metoCLOPramide (REGLAN) 10 MG tablet TAKE 1 TABLET (10 MG TOTAL) BY MOUTH 4 (FOUR) TIMES DAILY. 12/24/17 01/23/18  [name], MD  mirtazapine (REMERON) 7.5 MG tablet Take 7.5 mg by mouth at bedtime.    [provider]  Multiple Vitamins-Minerals (MULTIVITAMIN ADULT PO) Take by mouth.    [provider]  omeprazole (PRILOSEC) 20 MG capsule TAKE 1 CAPSULE EVERY DAY 11/05/17   [name], MD  ondansetron (ZOFRAN) 8 MG tablet TAKE 1 TABLET TWO TIMES DAILY AS NEEDED FOR REFRACTORY NAUSEA OR VOMITING.  04/03/17   [name], MD  ondansetron (ZOFRAN) 8 MG tablet Take 1 tablet (8 mg total) by mouth 2 (two) times daily as needed for refractory nausea / vomiting. Start on day 3 after carboplatin chemo. 12/04/17   [name], MD  potassium chloride SA (K-DUR,KLOR-CON) 20 MEQ tablet Take 20 mEq 2 (two) times daily by mouth.    [provider]  prochlorperazine (COMPAZINE) 10 MG tablet Take 1 tablet (10 mg total) by mouth every 6 (six) hours as needed (Nausea or vomiting). Patient not taking: Reported on 12/25/2017 03/28/17   [name], MD    Allergies Sulfa antibiotics and Levaquin  [levofloxacin]  Family History  Problem Relation Age of Onset  . Hypertension Father   . Heart disease Father   . Heart attack Father   . Congestive Heart Failure Mother   . Stroke Mother   . Atrial fibrillation Sister     Social History Social History   Tobacco Use  . Smoking status: Former Smoker    Packs/day: 1.00    Years: 68.00    Pack years: 68.00    Last attempt to quit: 11/06/2016    Years since quitting: 1.1  . Smokeless tobacco: Never Used  Substance Use Topics  . Alcohol use: No  . Drug use: No    Review of Systems Constitutional: No fever/chills Eyes: No visual changes. ENT: No sore throat. No stiff neck no neck pain Cardiovascular: Denies chest pain. Respiratory: Denies shortness of breath. Gastrointestinal:   no vomiting.  No diarrhea.  No constipation. Genitourinary: Negative for dysuria. Musculoskeletal: Negative lower extremity swelling Skin: Negative for rash. Neurological: Negative for severe headaches, focal weakness or numbness.   ____________________________________________   PHYSICAL EXAM:  VITAL SIGNS: ED Triage Vitals  Enc Vitals Group     BP 12/29/17 1626 (!) 101/55     Pulse Rate 12/29/17 1626 76     Resp 12/29/17 1626 16     Temp 12/29/17 1626 98.2 F (36.8 C)     Temp Source 12/29/17 1626 Oral     SpO2 12/29/17 1626 98 %     Weight 12/29/17 1637 147 lb 14.4 oz (67.1 kg)     Height --      Head Circumference --      Peak Flow --      Pain Score 12/29/17 1637 0     Pain Loc --      Pain Edu? --      Excl. in [place]? --     Constitutional: Alert and oriented. Well appearing and in no acute distress. Eyes: Conjunctivae are normal Head: Atraumatic HEENT: No congestion/rhinnorhea. Mucous membranes are moist.  Oropharynx non-erythematous Neck:   Nontender with no meningismus, no masses, no stridor Cardiovascular: Normal rate, regular rhythm. Grossly normal heart sounds.  Good peripheral circulation. Respiratory: Normal  respiratory effort.  No retractions. Lungs CTAB. Abdominal: Soft and nontender. No distention. No guarding no rebound Back:  There is no focal tenderness or step off.  there is no midline tenderness there are no lesions noted. there is no CVA tenderness Musculoskeletal: No lower extremity tenderness, no upper extremity tenderness. No joint effusions, no DVT signs strong distal pulses no edema Neurologic:  Normal speech and language. No gross focal neurologic deficits are appreciated.  Skin:  Skin is warm, dry and intact. No rash noted. Psychiatric: Mood and affect are normal. Speech and behavior are normal.  ____________________________________________   LABS (all labs ordered are listed, but only abnormal results are displayed)  Labs Reviewed  URINE CULTURE  CBC WITH DIFFERENTIAL/PLATELET  BASIC METABOLIC PANEL  LACTIC ACID, PLASMA  LACTIC ACID, PLASMA  URINALYSIS, COMPLETE (UACMP) WITH MICROSCOPIC  TYPE AND SCREEN    Pertinent labs  results that were available during my care of the patient were reviewed by me and considered in my medical decision making (see chart for details). ____________________________________________  EKG  I personally interpreted any EKGs ordered by me or triage Atrial fibrillation which is not new, normal axis no acute ischemic changes, rate 93 ____________________________________________  RADIOLOGY  Pertinent labs & imaging results that were available during my care of the patient were reviewed by me and considered in my medical decision making (see chart for details). If possible, patient and/or family made aware of any abnormal findings.  No results found. ____________________________________________    PROCEDURES  Procedure(s) performed: None  Procedures  Critical Care performed: CRITICAL CARE Performed by: Schuyler Amor   Total critical care time: 45 minutes  Critical care time was exclusive of separately billable procedures and  treating other patients.  Critical care was necessary to treat or prevent imminent or life-threatening deterioration.  Critical care was time spent personally by me on the following activities: development of treatment plan with patient and/or surrogate as well as nursing, discussions with consultants, evaluation of patient's response to treatment, examination of patient, obtaining history from patient or surrogate, ordering and performing treatments and interventions, ordering and review of laboratory studies, ordering and review of radiographic studies, pulse oximetry and re-evaluation  of patient's condition.   ____________________________________________   INITIAL IMPRESSION / ASSESSMENT AND PLAN / ED COURSE  Pertinent labs & imaging results that were available during my care of the patient were reviewed by me and considered in my medical decision making (see chart for details).  Patient here with the feeling that she is dehydrated.  Nothing else really stands on her review of systems.  Given her age we are checking broad-spectrum work-up including lactic acid to make sure this is not occult sepsis, CBC base metabolic panel chest x-ray and urinalysis, will hydrate her and see how she feels.  No reported GI bleed but also the type and screen as a precaution as she is anticoagulated.  ----------------------------------------- 6:31 PM on 12/29/2017 -----------------------------------------  Lactic came back at 3.9.  This is a very nonspecific finding and fortunately does not necessarily mean that there is sepsis however she also does appear to have urinary tract infection, with a lactic, and the low blood pressure and history of oncologic processes, I think patient would benefit from admission.  Family would very much prefer to go home but we will treat her as possible early sepsis give her IV fluids, I will give her Rocephin for her UTI we will admit her to the hospital     ____________________________________________   FINAL CLINICAL IMPRESSION(S) / ED DIAGNOSES  Final diagnoses:  None      This chart was dictated using voice recognition software.  Despite best efforts to proofread,  errors can occur which can change meaning.      Schuyler Amor, MD 12/29/17 1721    Schuyler Amor, MD 12/29/17 [phone]

## 2017-12-29 NOTE — ED Notes (Signed)
Critical result on lactic acid relayed to MD McShane.

## 2017-12-29 NOTE — ED Notes (Signed)
ED Provider at bedside. 

## 2017-12-30 ENCOUNTER — Encounter: Payer: Self-pay | Admitting: *Deleted

## 2017-12-30 LAB — COMPREHENSIVE METABOLIC PANEL
ALBUMIN: 2.2 g/dL — AB (ref 3.5–5.0)
ALT: 10 U/L (ref 0–44)
AST: 11 U/L — ABNORMAL LOW (ref 15–41)
Alkaline Phosphatase: 76 U/L (ref 38–126)
Anion gap: 4 — ABNORMAL LOW (ref 5–15)
BILIRUBIN TOTAL: 0.4 mg/dL (ref 0.3–1.2)
BUN: 19 mg/dL (ref 8–23)
CALCIUM: 7.6 mg/dL — AB (ref 8.9–10.3)
CO2: 29 mmol/L (ref 22–32)
CREATININE: 0.64 mg/dL (ref 0.44–1.00)
Chloride: 106 mmol/L (ref 98–111)
GFR calc Af Amer: >60 mL/min (ref >60)
GFR calc non Af Amer: >60 mL/min (ref >60)
GLUCOSE: 142 mg/dL — AB (ref 70–99)
Potassium: 3.9 mmol/L (ref 3.5–5.1)
SODIUM: 139 mmol/L (ref 135–145)
TOTAL PROTEIN: 4.7 g/dL — AB (ref 6.5–8.1)

## 2017-12-30 LAB — MAGNESIUM: Magnesium: 1.3 mg/dL — ABNORMAL LOW (ref 1.7–2.4)

## 2017-12-30 MED ORDER — MAGNESIUM SULFATE 2 GM/50ML IV SOLN: 2.0000 g | Freq: Once | INTRAVENOUS | Status: DC

## 2017-12-30 MED ORDER — AMIODARONE HCL 200 MG PO TABS: 200.0000 mg | ORAL_TABLET | Freq: Two times a day (BID) | ORAL | 11 refills | Status: AC

## 2017-12-30 MED ORDER — HEPARIN SOD (PORK) LOCK FLUSH 100 UNIT/ML IV SOLN
500.0000 [IU] | Freq: Once | INTRAVENOUS | Status: AC
  Administered 2017-12-30: 13:00:00 500 [IU] via INTRAVENOUS
  Filled 2017-12-30: qty 5

## 2017-12-30 MED ORDER — CEPHALEXIN 500 MG PO CAPS: 500.0000 mg | ORAL_CAPSULE | Freq: Three times a day (TID) | ORAL | 0 refills | Status: AC

## 2017-12-30 NOTE — Evaluation (Signed)
Physical Therapy Evaluation Patient Details Name: [name] MRN: [id number] DOB: 06/20/1932 Today's Date: 12/30/2017   History of Present Illness  83 yo female with onset of hypotension and elevated lactic acid from dehydration was admitted OBS, has been noted to have UTI but has been getting IV fluids with MD prior to this to manage her symptoms.  Pt is on radiation now for stage 4 lung cancer.  PMHx:  lung CA, chemo and radiation, PNA, COPD, chest tube, pleural effusion, PAF, PUD, B wrist fractures  Clinical Impression  Pt is up to walk with PT and note her control of balance is hindered by posterior lean and requires an AD for safety.  Her issues were there to some degree prior to this admission since her PLOF was to furniture walk.  Talked with all parties about using RW, following up with HHPT and progression to safer habits such as reaching back to find a surface before sitting.  Her acute therapy will continue until dc to progress balance and strength, then transition to home.    Follow Up Recommendations Home health PT;Supervision for mobility/OOB    Equipment Recommendations  None recommended by PT    Recommendations for Other Services       Precautions / Restrictions Precautions Precautions: Fall(telemetry) Restrictions Weight Bearing Restrictions: No      Mobility  Bed Mobility Overal bed mobility: Needs Assistance Bed Mobility: Supine to Sit;Sit to Supine     Supine to sit: Supervision Sit to supine: Supervision   General bed mobility comments: reminders for safety  Transfers Overall transfer level: Needs assistance Equipment used: Rolling walker (2 wheeled) Transfers: Sit to/from Stand Sit to Stand: Min guard         General transfer comment: min guard for safety but also reminded hand placement which pt continually forgets  Ambulation/Gait Ambulation/Gait assistance: Min guard Gait Distance (Feet): 300 Feet Assistive device: Rolling walker (2  wheeled) Gait Pattern/deviations: Step-through pattern;Decreased stride length;Wide base of support Gait velocity: reduced Gait velocity interpretation: <1.31 ft/sec, indicative of household ambulator General Gait Details: pt is slowing her pace and requires safety cues to avoid hitting her hands on the rails  Stairs            Wheelchair Mobility    Modified Rankin (Stroke Patients Only)       Balance Overall balance assessment: History of Falls;Needs assistance Sitting-balance support: Feet supported Sitting balance-Leahy Scale: Fair   Postural control: Posterior lean Standing balance support: Bilateral upper extremity supported;During functional activity Standing balance-Leahy Scale: Poor Standing balance comment: requires RW for balance control                             Pertinent Vitals/Pain Pain Assessment: No/denies pain    Home Living Family/patient expects to be discharged to:: Private residence Living Arrangements: Other relatives Available Help at Discharge: Family;Available PRN/intermittently(her granddaughter and son in law work) Type of Home: House Home Access: Stairs to enter Entrance Stairs-Rails: Left Entrance Stairs-Number of Steps: 1 Home Layout: One level Home Equipment: Environmental consultant - 2 wheels;Cane - single point      Prior Function Level of Independence: Independent with assistive device(s);Needs assistance   Gait / Transfers Assistance Needed: using RW or SPC at times but typically holds furniture  ADL's / Homemaking Assistance Needed: has granddaughter and her husband to assist cooking and cleanign        Hand Dominance   Dominant Hand: Right  Extremity/Trunk Assessment   Upper Extremity Assessment Upper Extremity Assessment: Overall WFL for tasks assessed    Lower Extremity Assessment Lower Extremity Assessment: Overall WFL for tasks assessed(B hips are 4 strength)    Cervical / Trunk Assessment Cervical / Trunk  Assessment: Kyphotic  Communication   Communication: No difficulties  Cognition Arousal/Alertness: Awake/alert Behavior During Therapy: Impulsive Overall Cognitive Status: History of cognitive impairments - at baseline                                 General Comments: pt seems to be at baseline      General Comments      Exercises     Assessment/Plan    PT Assessment Patient needs continued PT services  PT Problem List Decreased strength;Decreased range of motion;Decreased activity tolerance;Decreased balance;Decreased mobility;Decreased coordination;Decreased cognition;Decreased knowledge of use of DME;Decreased safety awareness;Cardiopulmonary status limiting activity;Obesity       PT Treatment Interventions DME instruction;Gait training;Stair training;Functional mobility training;Therapeutic activities;Therapeutic exercise;Balance training;Neuromuscular re-education;Patient/family education    PT Goals (Current goals can be found in the Care Plan section)  Acute Rehab PT Goals Patient Stated Goal: to go home and feel better PT Goal Formulation: With patient/family Time For Goal Achievement: 01/13/18 Potential to Achieve Goals: Good    Frequency Min 2X/week   Barriers to discharge Inaccessible home environment;Decreased caregiver support home alone for a few hours a day    Co-evaluation               AM-PAC PT "6 Clicks" Daily Activity  Outcome Measure Difficulty turning over in bed (including adjusting bedclothes, sheets and blankets)?: None Difficulty moving from lying on back to sitting on the side of the bed? : A Little Difficulty sitting down on and standing up from a chair with arms (e.g., wheelchair, bedside commode, etc,.)?: A Little Help needed moving to and from a bed to chair (including a wheelchair)?: A Little Help needed walking in hospital room?: A Little Help needed climbing 3-5 steps with a railing? : A Little 6 Click Score:  19    End of Session Equipment Utilized During Treatment: Gait belt Activity Tolerance: Patient tolerated treatment well;Other (comment)(limited by balance changes of weakness) Patient left: in bed;with call bell/phone within reach;with bed alarm set;with family/visitor present Nurse Communication: Mobility status PT Visit Diagnosis: Unsteadiness on feet (R26.81);Muscle weakness (generalized) (M62.81);History of falling (Z91.81)    Time: 6063-0160 PT Time Calculation (min) (ACUTE ONLY): 24 min   Charges:   PT Evaluation $PT Eval Moderate Complexity: 1 Mod PT Treatments $Gait Training: 8-22 mins       Ramond Dial 12/30/2017, 1:57 PM   Mee Hives, PT MS Acute Rehab Dept. Number: [place] [facility] [place]

## 2017-12-30 NOTE — Progress Notes (Signed)
Patient discharged home with granddaughter, IV removed, port deaccessed. Patient and granddaughter verbalized understanding of education, discharged with no complaints.

## 2017-12-30 NOTE — Plan of Care (Signed)
  Problem: Activity: Goal: Risk for activity intolerance will decrease Outcome: Progressing   Problem: Nutrition: Goal: Adequate nutrition will be maintained Outcome: Progressing   Problem: Safety: Goal: Ability to remain free from injury will improve Outcome: Progressing   

## 2017-12-30 NOTE — Discharge Summary (Signed)
[place] [place] [facility] NAME: [name]    MR#:  [id number]  DATE OF BIRTH:  June 28, 1932  DATE OF ADMISSION:  12/29/2017 ADMITTING PHYSICIAN: Sela Hua, MD  DATE OF DISCHARGE: 12/30/2017  PRIMARY CARE PHYSICIAN: [name], MD    ADMISSION DIAGNOSIS:  Dehydration [E86.0] Urinary tract infection without hematuria, site unspecified [N39.0]  DISCHARGE DIAGNOSIS:  Active Problems:   UTI (urinary tract infection)   SECONDARY DIAGNOSIS:   Past Medical History:  Diagnosis Date  . COPD (chronic obstructive pulmonary disease) ([place])   . GU (gastric peptic ulcer)   . Lung cancer ([place])   . Malignant pleural effusion   . PAF (paroxysmal atrial fibrillation) ([place]) 10/2016   a. 10/2016 post thoracentesis ; b. 10/2016 Echo: EF 65-70%, mild LVH;  c. Amio/Eliquis initiated (CHA2DSVASc = 3).  . Pneumonia   . Wrist fracture 1993   bilateral    HOSPITAL COURSE:   1.  Acute cystitis.  Patient was given IV Rocephin and IV fluids.  Switched over to Keflex upon going out of the hospital. 2.  Hypotension.  Discontinue Lasix.  Patient's granddaughter hesitant about stopping the Cardizem CD.  Patient states her blood pressure is always on the lower side. 3.  Lactic acidosis likely from dehydration and acute cystitis.  This has improved with IV fluids 4.  Metastatic lung cancer with paraesophageal mask and mets to the adrenal glands.  Undergoing radiation. 5.  Paroxysmal atrial fibrillation on amiodarone diltiazem and Eliquis 6.  Microcytic anemia 7.  Depression on Lexapro and Remeron 8.  Physical therapy recommended home with home health.  Patient interested in balance training as outpatient and gave her information on that clinic also.   DISCHARGE CONDITIONS:   Satisfactory  CONSULTS OBTAINED:  Physical therapy  DRUG ALLERGIES:   Allergies  Allergen Reactions  . Sulfa Antibiotics Other (See Comments)    Seizures   . Levaquin  [Levofloxacin] Hives and Itching    DISCHARGE MEDICATIONS:   Allergies as of 12/30/2017      Reactions   Sulfa Antibiotics Other (See Comments)   Seizures   Levaquin [levofloxacin] Hives, Itching      Medication List    STOP taking these medications   diphenoxylate-atropine 2.5-0.025 MG tablet Commonly known as:  LOMOTIL   furosemide 20 MG tablet Commonly known as:  LASIX   HYDROcodone-acetaminophen 5-325 MG tablet Commonly known as:  NORCO/VICODIN   prochlorperazine 10 MG tablet Commonly known as:  COMPAZINE     TAKE these medications   amiodarone 200 MG tablet Commonly known as:  PACERONE Take 1 tablet (200 mg total) by mouth 2 (two) times daily. What changed:  when to take this   apixaban 5 MG Tabs tablet Commonly known as:  ELIQUIS Take 1 tablet (5 mg total) by mouth 2 (two) times daily.   BIOTIN PO Take by mouth daily.   cephALEXin 500 MG capsule Commonly known as:  KEFLEX Take 1 capsule (500 mg total) by mouth 3 (three) times daily for 12 doses.   cetirizine 10 MG tablet Commonly known as:  ZYRTEC Take 10 mg by mouth daily as needed for allergies.   cholecalciferol 1000 units tablet Commonly known as:  VITAMIN D Take 1,000 Units by mouth daily.   dexamethasone 4 MG tablet Commonly known as:  DECADRON Take 1 tablet (4 mg total) by mouth daily.   diltiazem 120 MG 24 hr capsule Commonly known as:  CARDIZEM CD Take  1 capsule (120 mg total) by mouth daily.   escitalopram 5 MG tablet Commonly known as:  LEXAPRO Take 5 mg by mouth daily.   fentaNYL 25 MCG/HR patch Commonly known as:  DURAGESIC - dosed mcg/hr Place 1 patch (25 mcg total) onto the skin every 3 (three) days.   lidocaine-prilocaine cream Commonly known as:  EMLA APPLY TO AFFECTED AREA ONCE   metoCLOPramide 10 MG tablet Commonly known as:  REGLAN TAKE 1 TABLET (10 MG TOTAL) BY MOUTH 4 (FOUR) TIMES DAILY.   mirtazapine 7.5 MG tablet Commonly known as:  REMERON Take 7.5 mg by  mouth at bedtime.   MULTIVITAMIN ADULT PO Take by mouth.   omeprazole 20 MG capsule Commonly known as:  PRILOSEC TAKE 1 CAPSULE EVERY DAY   ondansetron 8 MG tablet Commonly known as:  ZOFRAN TAKE 1 TABLET TWO TIMES DAILY AS NEEDED FOR REFRACTORY NAUSEA OR VOMITING. What changed:  Another medication with the same name was removed. Continue taking this medication, and follow the directions you see here.   potassium chloride SA 20 MEQ tablet Commonly known as:  K-DUR,KLOR-CON Take 20 mEq 2 (two) times daily by mouth.        DISCHARGE INSTRUCTIONS:   Follow-up with PMD 5 days  If you experience worsening of your admission symptoms, develop shortness of breath, life threatening emergency, suicidal or homicidal thoughts you must seek medical attention immediately by calling 911 or calling your MD immediately  if symptoms less severe.  You Must read complete instructions/literature along with all the possible adverse reactions/side effects for all the Medicines you take and that have been prescribed to you. Take any new Medicines after you have completely understood and accept all the possible adverse reactions/side effects.   Please note  You were cared for by a hospitalist during your hospital stay. If you have any questions about your discharge medications or the care you received while you were in the hospital after you are discharged, you can call the unit and asked to speak with the hospitalist on call if the hospitalist that took care of you is not available. Once you are discharged, your primary care physician will handle any further medical issues. Please note that NO REFILLS for any discharge medications will be authorized once you are discharged, as it is imperative that you return to your primary care physician (or establish a relationship with a primary care physician if you do not have one) for your aftercare needs so that they can reassess your need for medications and monitor  your lab values.    Today   CHIEF COMPLAINT:   Chief Complaint  Patient presents with  . Hypotension    HISTORY OF PRESENT ILLNESS:  [name]  is a 84 y.o. female presented with weakness and found to have hypotension   VITAL SIGNS:  Blood pressure 102/62, pulse (!) 104, temperature 98.5 F (36.9 C), temperature source Oral, resp. rate 16, height 5\' 3"  (1.6 m), weight 71.2 kg, SpO2 98 %.   PHYSICAL EXAMINATION:  GENERAL:  84 y.o.-year-old patient lying in the bed with no acute distress.  EYES: Pupils equal, round, reactive to light and accommodation. No scleral icterus. Extraocular muscles intact.  HEENT: Head atraumatic, normocephalic. Oropharynx and nasopharynx clear.  NECK:  Supple, no jugular venous distention. No thyroid enlargement, no tenderness.  LUNGS: Normal breath sounds bilaterally, no wheezing, rales,rhonchi or crepitation. No use of accessory muscles of respiration.  CARDIOVASCULAR: S1, S2 normal. No murmurs, rubs, or gallops.  ABDOMEN: Soft, non-tender, non-distended. Bowel sounds present. No organomegaly or mass.  EXTREMITIES: No pedal edema, cyanosis, or clubbing.  NEUROLOGIC: Cranial nerves II through XII are intact. Muscle strength 5/5 in all extremities. Sensation intact. Gait not checked.  PSYCHIATRIC: The patient is alert and oriented x 3.  SKIN: No obvious rash, lesion, or ulcer.   DATA REVIEW:   CBC Recent Labs  Lab 12/29/17 1642  WBC 7.2  HGB 9.0*  HCT 29.5*  PLT 112*    Chemistries  Recent Labs  Lab 12/30/17 0631  NA 139  K 3.9  CL 106  CO2 29  GLUCOSE 142*  BUN 19  CREATININE 0.64  CALCIUM 7.6*  MG 1.3*  AST 11*  ALT 10  ALKPHOS 76  BILITOT 0.4     Microbiology Results  Results for orders placed or performed during the hospital encounter of 12/29/17  Culture, blood (routine x 2)     Status: None (Preliminary result)   Collection Time: 12/29/17  6:30 PM  Result Value Ref Range Status   Specimen Description BLOOD L  WRIST  Final   Special Requests   Final    BOTTLES DRAWN AEROBIC AND ANAEROBIC Blood Culture adequate volume   Culture   Final    NO GROWTH < 12 HOURS Performed at [facility], [address] [place], [place] [address]    Report Status PENDING  Incomplete  Culture, blood (routine x 2)     Status: None (Preliminary result)   Collection Time: 12/29/17  6:30 PM  Result Value Ref Range Status   Specimen Description BLOOD R WRIST  Final   Special Requests   Final    BOTTLES DRAWN AEROBIC AND ANAEROBIC Blood Culture results may not be optimal due to an excessive volume of blood received in culture bottles   Culture   Final    NO GROWTH < 12 HOURS Performed at [facility], [address], [place], [place] [address]    Report Status PENDING  Incomplete    RADIOLOGY:  Dg Chest 2 View  Result Date: 12/29/2017 CLINICAL DATA:  Weakness, stage IV lung cancer. Intermittent hypotension. Currently being treated with chemotherapy and radiation therapy. History of malignant pleural effusion, pneumonia, COPD, chest tube insertion. EXAM: CHEST - 2 VIEW COMPARISON:  Chest x-ray dated 09/27/2017. FINDINGS: Heart size and mediastinal contours are stable. Mild scarring/atelectasis at the LEFT lung base, stable. No pleural effusions seen. LEFT twelfth wall Port-A-Cath in place, stable in position with tip at the level of the upper SVC. No acute or suspicious osseous finding. A destructive lesion was identified in the LEFT fourth rib on earlier PET-CT, but this is not able to be visualized by plain film. IMPRESSION: No active cardiopulmonary disease. No evidence of pneumonia or pulmonary edema. No pleural effusion seen. Electronically Signed   By: Franki Cabot M.D.   On: 12/29/2017 18:08      Management plans discussed with the patient, family and they are in agreement.  CODE STATUS:     Code Status Orders  (From admission, onward)         Start     Ordered   12/29/17 2224  Do  not attempt resuscitation (DNR)  Continuous    Question Answer Comment  In the event of cardiac or respiratory ARREST Do not call a "code blue"   In the event of cardiac or respiratory ARREST Do not perform Intubation, CPR, defibrillation or ACLS   In the event of cardiac  or respiratory ARREST Use medication by any route, position, wound care, and other measures to relive pain and suffering. May use oxygen, suction and manual treatment of airway obstruction as needed for comfort.      12/29/17 2223        Code Status History    Date Active Date Inactive Code Status Order ID Comments User Context   01/01/2017 0404 01/08/2017 2133 DNR [id number]  Saundra Shelling, MD Inpatient   01/01/2017 0221 01/01/2017 0404 Full Code [id number]  Salary, Avel Peace, MD Inpatient   12/18/2016 1636 12/19/2016 1757 Full Code [id number]  [name], MD Inpatient   11/12/2016 1812 11/16/2016 1818 Full Code [id number]  Dustin Flock, MD Inpatient    Advance Directive Documentation     Most Recent Value  Type of Advance Directive  Living will  Pre-existing out of facility DNR order (yellow form or pink MOST form)  -  "MOST" Form in Place?  -      TOTAL TIME TAKING CARE OF THIS PATIENT: 35 minutes.    Loletha Grayer M.D on 12/30/2017 at 2:14 PM  Between 7am to 6pm - Pager - [phone]  After 6pm go to [email or web address] - password [email or web address]  [employer] Office  [phone]  CC: Primary care physician; [name], MD

## 2017-12-30 NOTE — Care Management Note (Signed)
Case Management Note  Patient Details  Name: [name] MRN: [id number] Date of Birth: 01-31-1933  Subjective/Objective: Patient to be discharged per MD order. Orders in place for home health services. Spoke with patient and spouse at bedside who are only interested in the physical therapy portion of home health. Given choice, they have selected [facility]. Referral placed with [place] who agrees to accept the patient for PT. No DME needs. Spouse to provide transport.  Ines Bloomer RN BSN RNCM [phone]                     Action/Plan:   Expected Discharge Date:  12/30/17               Expected Discharge Plan:  [place]  In-House Referral:     Discharge planning Services  CM Consult  Post Acute Care Choice:  Home Health Choice offered to:  Patient, Adult Children  DME Arranged:    DME Agency:     HH Arranged:  PT HH Agency:  [facility]  Status of Service:  Completed, signed off  If discussed at [place] of Stay Meetings, dates discussed:    Additional Comments:  Brianna Rasheed A Assata Juncaj, RN 12/30/2017, 2:10 PM

## 2017-12-30 NOTE — Progress Notes (Deleted)
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  INTERVAL HISTORY: Patient returns to clinic today for further evaluation and reconsideration of cycle 1, day 8 of carboplatin and gemcitabine.  Gemcitabine only today.  Her performance status has significantly improved with not receiving treatment last week.  Her pain as well as difficulty swallowing have also improved.  She has no neurologic complaints.  She does not complain of shortness of breath or chest pain today.  She denies any nausea, vomiting, constipation, or diarrhea. She has no urinary complaints.  Patient offers no further specific complaints today.  REVIEW OF SYSTEMS:   Review of Systems  Constitutional: Positive for malaise/fatigue. Negative for fever and weight loss.  HENT: Negative.  Negative for sore throat.   Respiratory: Negative.  Negative for cough, hemoptysis and shortness of breath.   Cardiovascular: Negative.  Negative for chest pain and leg swelling.  Gastrointestinal: Negative.  Negative for abdominal pain and diarrhea.  Genitourinary: Negative.  Negative for flank pain.  Musculoskeletal: Positive for back pain. Negative for falls and joint pain.  Skin: Negative.  Negative for itching and rash.  Neurological: Positive for weakness. Negative for sensory change, focal weakness and headaches.  Psychiatric/Behavioral: Negative.  The patient is not nervous/anxious and does not have insomnia.     As per HPI. Otherwise, a complete review of systems is negative.  PAST MEDICAL HISTORY: Past Medical History:  Diagnosis Date  . COPD (chronic obstructive pulmonary disease) ([place])   . GU (gastric peptic ulcer)   . Lung cancer ([place])   . Malignant  pleural effusion   . PAF (paroxysmal atrial fibrillation) ([place]) 10/2016   a. 10/2016 post thoracentesis ; b. 10/2016 Echo: EF 65-70%, mild LVH;  c. Amio/Eliquis initiated (CHA2DSVASc = 3).  . Pneumonia   . Wrist fracture 1993   bilateral    PAST SURGICAL HISTORY: Past Surgical History:  Procedure Laterality Date  . ABDOMINAL HYSTERECTOMY    . CATARACT EXTRACTION W/ INTRAOCULAR LENS  IMPLANT, BILATERAL    . CHEST TUBE INSERTION Left 12/18/2016   Procedure: CHEST TUBE INSERTION;  Surgeon: [name], MD;  Location: [facility] ORS;  Service: General;  Laterality: Left;  . CHOLECYSTECTOMY    . CLOSED REDUCTION WRIST FRACTURE Left 01/04/2017   Procedure: CLOSED REDUCTION WRIST;  Surgeon: Thornton Park, MD;  Location: [facility] ORS;  Service: Orthopedics;  Laterality: Left;  . PORTACATH PLACEMENT Left 12/18/2016   Procedure: INSERTION PORT-A-CATH;  Surgeon: [name], MD;  Location: [facility] ORS;  Service: General;  Laterality: Left;  . REMOVAL OF PLEURAL DRAINAGE CATHETER N/A 01/02/2017   Procedure: REMOVAL OF PLEURAL DRAINAGE CATHETER;  Surgeon: [name], MD;  Location: [facility] ORS;  Service: Thoracic;  Laterality: N/A;  . TONSILLECTOMY      FAMILY HISTORY: Family History  Problem Relation Age of Onset  . Hypertension Father   . Heart disease Father   . Heart attack Father   . Congestive Heart Failure Mother   . Stroke Mother   . Atrial fibrillation Sister     ADVANCED DIRECTIVES (Y/N):  N  HEALTH MAINTENANCE: Social History   Tobacco Use  . Smoking status: Former Smoker    Packs/day: 1.00    Years: 68.00    Pack years: 68.00  Last attempt to quit: 11/06/2016    Years since quitting: 1.1  . Smokeless tobacco: Never Used  Substance Use Topics  . Alcohol use: No  . Drug use: No     Colonoscopy:  PAP:  Bone density:  Lipid panel:  Allergies  Allergen Reactions  . Sulfa Antibiotics Other (See Comments)    Seizures   . Levaquin [Levofloxacin] Hives and Itching     Current Outpatient Medications  Medication Sig Dispense Refill  . amiodarone (PACERONE) 200 MG tablet Take 1 tablet (200 mg total) by mouth 2 (two) times daily.  11  . apixaban (ELIQUIS) 5 MG TABS tablet Take 1 tablet (5 mg total) by mouth 2 (two) times daily. 60 tablet 0  . BIOTIN PO Take by mouth daily.    . cephALEXin (KEFLEX) 500 MG capsule Take 1 capsule (500 mg total) by mouth 3 (three) times daily for 12 doses. 10 capsule 0  . cetirizine (ZYRTEC) 10 MG tablet Take 10 mg by mouth daily as needed for allergies.    . cholecalciferol (VITAMIN D) 1000 units tablet Take 1,000 Units by mouth daily.    Marland Kitchen dexamethasone (DECADRON) 4 MG tablet Take 1 tablet (4 mg total) by mouth daily. 30 tablet 2  . diltiazem (CARDIZEM CD) 120 MG 24 hr capsule Take 1 capsule (120 mg total) by mouth daily.    Marland Kitchen escitalopram (LEXAPRO) 5 MG tablet Take 5 mg by mouth daily.    . fentaNYL (DURAGESIC - DOSED MCG/HR) 25 MCG/HR patch Place 1 patch (25 mcg total) onto the skin every 3 (three) days. 10 patch 0  . lidocaine-prilocaine (EMLA) cream APPLY TO AFFECTED AREA ONCE  3  . metoCLOPramide (REGLAN) 10 MG tablet TAKE 1 TABLET (10 MG TOTAL) BY MOUTH 4 (FOUR) TIMES DAILY. 120 tablet 0  . mirtazapine (REMERON) 7.5 MG tablet Take 7.5 mg by mouth at bedtime.    . Multiple Vitamins-Minerals (MULTIVITAMIN ADULT PO) Take by mouth.    Marland Kitchen omeprazole (PRILOSEC) 20 MG capsule TAKE 1 CAPSULE EVERY DAY 90 capsule 2  . ondansetron (ZOFRAN) 8 MG tablet TAKE 1 TABLET TWO TIMES DAILY AS NEEDED FOR REFRACTORY NAUSEA OR VOMITING.  120 tablet 0  . potassium chloride SA (K-DUR,KLOR-CON) 20 MEQ tablet Take 20 mEq 2 (two) times daily by mouth.     No current facility-administered medications for this visit.    Facility-Administered Medications Ordered in Other Visits  Medication Dose Route Frequency Provider Last Rate Last Dose  . heparin lock flush 100 unit/mL  500 Units Intravenous Once [name], MD      . sodium chloride  flush (NS) 0.9 % injection 10 mL  10 mL Intravenous Once [name], MD        OBJECTIVE: There were no vitals filed for this visit.   There is no height or weight on file to calculate BMI.    ECOG FS:1 - Symptomatic but completely ambulatory  General: Well-developed, well-nourished, no acute distress.  Sitting in a wheelchair. Eyes: Pink conjunctiva, anicteric sclera. HEENT: Normocephalic, moist mucous membranes. Lungs: Clear to auscultation bilaterally. Heart: Regular rate and rhythm. No rubs, murmurs, or gallops. Abdomen: Soft, nontender, nondistended. No organomegaly noted, normoactive bowel sounds. Musculoskeletal: No edema, cyanosis, or clubbing. Neuro: Alert, answering all questions appropriately. Cranial nerves grossly intact. Skin: No rashes or petechiae noted. Psych: Normal affect.  LAB RESULTS:  Lab Results  Component Value Date   NA 139 12/30/2017   K 3.9 12/30/2017   CL  106 12/30/2017   CO2 29 12/30/2017   GLUCOSE 142 (H) 12/30/2017   BUN 19 12/30/2017   CREATININE 0.64 12/30/2017   CALCIUM 7.6 (L) 12/30/2017   PROT 4.7 (L) 12/30/2017   ALBUMIN 2.2 (L) 12/30/2017   AST 11 (L) 12/30/2017   ALT 10 12/30/2017   ALKPHOS 76 12/30/2017   BILITOT 0.4 12/30/2017   GFRNONAA >60 12/30/2017   GFRAA >60 12/30/2017    Lab Results  Component Value Date   WBC 7.2 12/29/2017   NEUTROABS 6.1 12/29/2017   HGB 9.0 (L) 12/29/2017   HCT 29.5 (L) 12/29/2017   MCV 77.8 (L) 12/29/2017   PLT 112 (L) 12/29/2017     STUDIES: Dg Chest 2 View  Result Date: 12/29/2017 CLINICAL DATA:  Weakness, stage IV lung cancer. Intermittent hypotension. Currently being treated with chemotherapy and radiation therapy. History of malignant pleural effusion, pneumonia, COPD, chest tube insertion. EXAM: CHEST - 2 VIEW COMPARISON:  Chest x-ray dated 09/27/2017. FINDINGS: Heart size and mediastinal contours are stable. Mild scarring/atelectasis at the LEFT lung base, stable. No pleural  effusions seen. LEFT twelfth wall Port-A-Cath in place, stable in position with tip at the level of the upper SVC. No acute or suspicious osseous finding. A destructive lesion was identified in the LEFT fourth rib on earlier PET-CT, but this is not able to be visualized by plain film. IMPRESSION: No active cardiopulmonary disease. No evidence of pneumonia or pulmonary edema. No pleural effusion seen. Electronically Signed   By: Franki Cabot M.D.   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  PLAN:    1. 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  She is noted to have a new large right adrenal metastatic lesion as well as a new central pancreatic body metastatic lesion.  Left adrenal lesion is bigger.  The dominant left lower paraesophageal mass has also increased in size.  Beryle Flock has been discontinued.  Previously, hospice and end-of-life care were discussed, but patient is not ready to make this decision.  Despite her difficulty with chemotherapy previously, patient has agreed to attempt a second line therapy using carboplatin and gemcitabine.  She has refused Taxol or Taxotere.  Delay cycle 1, day 8 of carboplatin and gemcitabine, gemcitabine only, again today secondary persistent pancytopenia.  Return to clinic in 1 week for further evaluation and reconsideration of treatment.   2.  Difficulty swallowing: Improving.  Possibly secondary to paraesophageal mass.  Continue daily XRT, completing on January 11, 2018.   3.  Flank/chest pain: Improved.  Possibly related to progressive malignancy.  Continue current narcotic regimen as prescribed.   4.  Renal insufficiency: Patient's creatinine is now within normal limits. 5.  Thrombocytopenia: Secondary to chemotherapy, delay treatment as above. 6.  Anemia: Patient's hemoglobin is decreased, but stable.   Monitor. 7.  Neutropenia: Secondary to chemotherapy.  Delay treatment as above. 8.  Hypotension: Patient will receive 1 L of IV fluids today.  Patient expressed understanding and was in agreement with this plan. She also understands that She can call clinic at any time with any questions, concerns, or complaints.   Cancer Staging Squamous cell carcinoma lung, left ([place]) Staging form: Lung, AJCC 8th Edition - Clinical stage from 12/13/2016: Stage IVA (cT4, cN0, cM1a) - Signed by [name], MD on 12/13/2016   [name], MD  12/30/2017 7:54 PM

## 2017-12-31 ENCOUNTER — Ambulatory Visit
Admission: RE | Admit: 2017-12-31 | Discharge: 2017-12-31 | Disposition: A | Discharge status: Home / Self Care | Payer: Medicare HMO | Source: Ambulatory Visit | Attending: Radiation Oncology | Admitting: Radiation Oncology

## 2017-12-31 DIAGNOSIS — Z510 Encounter for antineoplastic radiation therapy: Secondary | ICD-10-CM | POA: Diagnosis not present

## 2018-01-01 ENCOUNTER — Inpatient Hospital Stay: Payer: Medicare HMO

## 2018-01-01 ENCOUNTER — Inpatient Hospital Stay: Payer: Medicare HMO | Admitting: Oncology

## 2018-01-01 ENCOUNTER — Other Ambulatory Visit: Payer: Self-pay

## 2018-01-01 ENCOUNTER — Inpatient Hospital Stay (HOSPITAL_BASED_OUTPATIENT_CLINIC_OR_DEPARTMENT_OTHER): Payer: Medicare HMO | Admitting: Oncology

## 2018-01-01 ENCOUNTER — Ambulatory Visit
Admission: RE | Admit: 2018-01-01 | Discharge: 2018-01-01 | Disposition: A | Discharge status: Home / Self Care | Payer: Medicare HMO | Source: Ambulatory Visit | Attending: Radiation Oncology | Admitting: Radiation Oncology

## 2018-01-01 DIAGNOSIS — Z79899 Other long term (current) drug therapy: Secondary | ICD-10-CM

## 2018-01-01 DIAGNOSIS — R1310 Dysphagia, unspecified: Secondary | ICD-10-CM

## 2018-01-01 DIAGNOSIS — R530 Neoplastic (malignant) related fatigue: Secondary | ICD-10-CM

## 2018-01-01 DIAGNOSIS — Z7901 Long term (current) use of anticoagulants: Secondary | ICD-10-CM

## 2018-01-01 DIAGNOSIS — I951 Orthostatic hypotension: Secondary | ICD-10-CM

## 2018-01-01 DIAGNOSIS — C3492 Malignant neoplasm of unspecified part of left bronchus or lung: Secondary | ICD-10-CM | POA: Diagnosis not present

## 2018-01-01 DIAGNOSIS — E860 Dehydration: Secondary | ICD-10-CM

## 2018-01-01 DIAGNOSIS — I480 Paroxysmal atrial fibrillation: Secondary | ICD-10-CM

## 2018-01-01 DIAGNOSIS — R531 Weakness: Secondary | ICD-10-CM

## 2018-01-01 DIAGNOSIS — Z8249 Family history of ischemic heart disease and other diseases of the circulatory system: Secondary | ICD-10-CM

## 2018-01-01 DIAGNOSIS — C7971 Secondary malignant neoplasm of right adrenal gland: Secondary | ICD-10-CM

## 2018-01-01 DIAGNOSIS — Z510 Encounter for antineoplastic radiation therapy: Secondary | ICD-10-CM | POA: Diagnosis not present

## 2018-01-01 DIAGNOSIS — E46 Unspecified protein-calorie malnutrition: Secondary | ICD-10-CM

## 2018-01-01 DIAGNOSIS — N390 Urinary tract infection, site not specified: Secondary | ICD-10-CM

## 2018-01-01 DIAGNOSIS — Z87891 Personal history of nicotine dependence: Secondary | ICD-10-CM

## 2018-01-01 LAB — CBC WITH DIFFERENTIAL/PLATELET
Abs Immature Granulocytes: 0.4 10*3/uL — ABNORMAL HIGH (ref 0.00–0.07)
Basophils Absolute: 0 10*3/uL (ref 0.0–0.1)
Basophils Relative: 0 %
EOS ABS: 0 10*3/uL (ref 0.0–0.5)
EOS PCT: 0 %
HCT: 28.6 % — ABNORMAL LOW (ref 36.0–46.0)
Hemoglobin: 8.8 g/dL — ABNORMAL LOW (ref 12.0–15.0)
IMMATURE GRANULOCYTES: 4 %
LYMPHS ABS: 0.4 10*3/uL — AB (ref 0.7–4.0)
LYMPHS PCT: 4 %
MCH: 23.6 pg — ABNORMAL LOW (ref 26.0–34.0)
MCHC: 30.8 g/dL (ref 30.0–36.0)
MCV: 76.7 fL — ABNORMAL LOW (ref 80.0–100.0)
Monocytes Absolute: 0.8 10*3/uL (ref 0.1–1.0)
Monocytes Relative: 9 %
NEUTROS PCT: 83 %
NRBC: 0 % (ref 0.0–0.2)
Neutro Abs: 7.6 10*3/uL (ref 1.7–7.7)
PLATELETS: 141 10*3/uL — AB (ref 150–400)
RBC: 3.73 MIL/uL — AB (ref 3.87–5.11)
RDW: 17.9 % — AB (ref 11.5–15.5)
WBC: 9.3 10*3/uL (ref 4.0–10.5)

## 2018-01-01 LAB — URINE CULTURE: Culture: >=100000 — AB

## 2018-01-01 LAB — COMPREHENSIVE METABOLIC PANEL
ALT: 10 U/L (ref 0–44)
ANION GAP: 9 (ref 5–15)
AST: 18 U/L (ref 15–41)
Albumin: 2.6 g/dL — ABNORMAL LOW (ref 3.5–5.0)
Alkaline Phosphatase: 95 U/L (ref 38–126)
BUN: 21 mg/dL (ref 8–23)
CALCIUM: 8.3 mg/dL — AB (ref 8.9–10.3)
CHLORIDE: 103 mmol/L (ref 98–111)
CO2: 25 mmol/L (ref 22–32)
CREATININE: 0.92 mg/dL (ref 0.44–1.00)
GFR calc Af Amer: >60 mL/min (ref >60)
GFR, EST NON AFRICAN AMERICAN: 56 mL/min — AB (ref >60)
Glucose, Bld: 135 mg/dL — ABNORMAL HIGH (ref 70–99)
Potassium: 3.2 mmol/L — ABNORMAL LOW (ref 3.5–5.1)
SODIUM: 137 mmol/L (ref 135–145)
Total Bilirubin: 0.4 mg/dL (ref 0.3–1.2)
Total Protein: 5.5 g/dL — ABNORMAL LOW (ref 6.5–8.1)

## 2018-01-01 LAB — MAGNESIUM: MAGNESIUM: 1.4 mg/dL — AB (ref 1.7–2.4)

## 2018-01-01 MED ORDER — MAGNESIUM SULFATE 4 GM/100ML IV SOLN
4.0000 g | Freq: Once | INTRAVENOUS | Status: AC
  Administered 2018-01-01: 4 g via INTRAVENOUS
  Filled 2018-01-01: qty 100

## 2018-01-01 MED ORDER — SODIUM CHLORIDE 0.9 % IV SOLN
INTRAVENOUS | Status: DC
  Administered 2018-01-01: 12:00:00 via INTRAVENOUS
  Filled 2018-01-01: qty 250

## 2018-01-01 MED ORDER — SODIUM CHLORIDE 0.9 % IV SOLN
Freq: Once | INTRAVENOUS | Status: AC
  Administered 2018-01-01: 12:00:00 via INTRAVENOUS
  Filled 2018-01-01: qty 250

## 2018-01-01 MED ORDER — SODIUM CHLORIDE 0.9 % IV SOLN: 4.0000 g | Freq: Once | INTRAVENOUS | Status: DC

## 2018-01-01 MED ORDER — HEPARIN SOD (PORK) LOCK FLUSH 100 UNIT/ML IV SOLN
500.0000 [IU] | Freq: Once | INTRAVENOUS | Status: AC
  Administered 2018-01-01: 500 [IU] via INTRAVENOUS
  Filled 2018-01-01: qty 5

## 2018-01-01 NOTE — Progress Notes (Signed)
Here for follow up. In pt less than 24 h -for dehydration and UTI per pt and family   " not feeling a lot better " per pt.

## 2018-01-01 NOTE — Progress Notes (Addendum)
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  INTERVAL HISTORY: Patient returns to clinic today for further evaluation and reconsideration of reinitiating chemotherapy.  She is admitted to the hospital over the weekend with UTI and dehydration.  She continues to have increased weakness and fatigue and decreased performance status.  She has a poor appetite and continues to have difficulty swallowing.  She has no neurologic complaints.  She does not complain of shortness of breath or chest pain today.  She denies any nausea, vomiting, constipation, or diarrhea. She has no urinary complaints.  Patient feels generally terrible, but offers no further specific complaints.  REVIEW OF SYSTEMS:   Review of Systems  Constitutional: Positive for malaise/fatigue. Negative for fever and weight loss.  HENT: Negative.  Negative for sore throat.   Respiratory: Negative.  Negative for cough, hemoptysis and shortness of breath.   Cardiovascular: Negative.  Negative for chest pain and leg swelling.  Gastrointestinal: Negative.  Negative for abdominal pain and diarrhea.  Genitourinary: Negative.  Negative for flank pain.  Musculoskeletal: Positive for back pain. Negative for falls and joint pain.  Skin: Negative.  Negative for itching and rash.  Neurological: Positive for weakness. Negative for sensory change, focal weakness and headaches.  Psychiatric/Behavioral: Negative.  The patient is not nervous/anxious and does not have insomnia.     As per HPI. Otherwise, a complete review of systems is negative.  PAST MEDICAL HISTORY: Past Medical History:  Diagnosis Date  . COPD (chronic obstructive pulmonary disease) ([place])   . GU  (gastric peptic ulcer)   . Lung cancer ([place])   . Malignant pleural effusion   . PAF (paroxysmal atrial fibrillation) ([place]) 10/2016   a. 10/2016 post thoracentesis ; b. 10/2016 Echo: EF 65-70%, mild LVH;  c. Amio/Eliquis initiated (CHA2DSVASc = 3).  . Pneumonia   . Wrist fracture 1993   bilateral    PAST SURGICAL HISTORY: Past Surgical History:  Procedure Laterality Date  . ABDOMINAL HYSTERECTOMY    . CATARACT EXTRACTION W/ INTRAOCULAR LENS  IMPLANT, BILATERAL    . CHEST TUBE INSERTION Left 12/18/2016   Procedure: CHEST TUBE INSERTION;  Surgeon: [name], MD;  Location: [facility] ORS;  Service: General;  Laterality: Left;  . CHOLECYSTECTOMY    . CLOSED REDUCTION WRIST FRACTURE Left 01/04/2017   Procedure: CLOSED REDUCTION WRIST;  Surgeon: Thornton Park, MD;  Location: [facility] ORS;  Service: Orthopedics;  Laterality: Left;  . PORTACATH PLACEMENT Left 12/18/2016   Procedure: INSERTION PORT-A-CATH;  Surgeon: [name], MD;  Location: [facility] ORS;  Service: General;  Laterality: Left;  . REMOVAL OF PLEURAL DRAINAGE CATHETER N/A 01/02/2017   Procedure: REMOVAL OF PLEURAL DRAINAGE CATHETER;  Surgeon: [name], MD;  Location: [facility] ORS;  Service: Thoracic;  Laterality: N/A;  . TONSILLECTOMY      FAMILY HISTORY: Family History  Problem Relation Age of Onset  . Hypertension Father   . Heart disease Father   . Heart attack Father   . Congestive Heart Failure Mother   . Stroke Mother   . Atrial fibrillation Sister     ADVANCED DIRECTIVES (Y/N):  N  HEALTH MAINTENANCE: Social History   Tobacco Use  . Smoking status: Former Smoker    Packs/day: 1.00  Years: 68.00    Pack years: 68.00    Last attempt to quit: 11/06/2016    Years since quitting: 1.1  . Smokeless tobacco: Never Used  Substance Use Topics  . Alcohol use: No  . Drug use: No     Colonoscopy:  PAP:  Bone density:  Lipid panel:  Allergies  Allergen Reactions  . Sulfa Antibiotics Other (See Comments)     Seizures   . Levaquin [Levofloxacin] Hives and Itching    Current Outpatient Medications  Medication Sig Dispense Refill  . amiodarone (PACERONE) 200 MG tablet Take 1 tablet (200 mg total) by mouth 2 (two) times daily.  11  . apixaban (ELIQUIS) 5 MG TABS tablet Take 1 tablet (5 mg total) by mouth 2 (two) times daily. 60 tablet 0  . BIOTIN PO Take by mouth daily.    . cephALEXin (KEFLEX) 500 MG capsule Take 1 capsule (500 mg total) by mouth 3 (three) times daily for 12 doses. 10 capsule 0  . cetirizine (ZYRTEC) 10 MG tablet Take 10 mg by mouth daily as needed for allergies.    . cholecalciferol (VITAMIN D) 1000 units tablet Take 1,000 Units by mouth daily.    Marland Kitchen dexamethasone (DECADRON) 4 MG tablet Take 1 tablet (4 mg total) by mouth daily. 30 tablet 2  . diltiazem (CARDIZEM CD) 120 MG 24 hr capsule Take 1 capsule (120 mg total) by mouth daily.    Marland Kitchen escitalopram (LEXAPRO) 5 MG tablet Take 5 mg by mouth daily.    . fentaNYL (DURAGESIC - DOSED MCG/HR) 25 MCG/HR patch Place 1 patch (25 mcg total) onto the skin every 3 (three) days. 10 patch 0  . lidocaine-prilocaine (EMLA) cream APPLY TO AFFECTED AREA ONCE  3  . metoCLOPramide (REGLAN) 10 MG tablet TAKE 1 TABLET (10 MG TOTAL) BY MOUTH 4 (FOUR) TIMES DAILY. 120 tablet 0  . mirtazapine (REMERON) 7.5 MG tablet Take 7.5 mg by mouth at bedtime.    . Multiple Vitamins-Minerals (MULTIVITAMIN ADULT PO) Take by mouth.    Marland Kitchen omeprazole (PRILOSEC) 20 MG capsule TAKE 1 CAPSULE EVERY DAY 90 capsule 2  . potassium chloride SA (K-DUR,KLOR-CON) 20 MEQ tablet Take 20 mEq 2 (two) times daily by mouth.    . ondansetron (ZOFRAN) 8 MG tablet TAKE 1 TABLET TWO TIMES DAILY AS NEEDED FOR REFRACTORY NAUSEA OR VOMITING.  (Patient not taking: Reported on 01/01/2018) 120 tablet 0   No current facility-administered medications for this visit.    Facility-Administered Medications Ordered in Other Visits  Medication Dose Route Frequency Provider Last Rate Last Dose  . 0.9 %   sodium chloride infusion   Intravenous Continuous [name], MD 10 mL/hr at 01/01/18 1135    . heparin lock flush 100 unit/mL  500 Units Intravenous Once [name], MD      . magnesium sulfate IVPB 4 g 100 mL  4 g Intravenous Once [name], MD 50 mL/hr at 01/01/18 1137 4 g at 01/01/18 1137  . sodium chloride flush (NS) 0.9 % injection 10 mL  10 mL Intravenous Once [name], MD        OBJECTIVE: Vitals:   01/01/18 1008  BP: 108/71  Pulse: (!) 103  Resp: 18  Temp: (!) 95.5 F (35.3 C)     Body mass index is 26.2 kg/m.    ECOG FS:2 - Symptomatic, <50% confined to bed  General: Well-developed, well-nourished, no acute distress. Eyes: Pink conjunctiva, anicteric sclera. HEENT: Normocephalic, moist  mucous membranes, clear oropharnyx. Lungs: Clear to auscultation bilaterally. Heart: Regular rate and rhythm. No rubs, murmurs, or gallops. Abdomen: Soft, nontender, nondistended. No organomegaly noted, normoactive bowel sounds. Musculoskeletal: No edema, cyanosis, or clubbing. Neuro: Alert, answering all questions appropriately. Cranial nerves grossly intact. Skin: No rashes or petechiae noted. Psych: Normal affect.  LAB RESULTS:  Lab Results  Component Value Date   NA 137 01/01/2018   K 3.2 (L) 01/01/2018   CL 103 01/01/2018   CO2 25 01/01/2018   GLUCOSE 135 (H) 01/01/2018   BUN 21 01/01/2018   CREATININE 0.92 01/01/2018   CALCIUM 8.3 (L) 01/01/2018   PROT 5.5 (L) 01/01/2018   ALBUMIN 2.6 (L) 01/01/2018   AST 18 01/01/2018   ALT 10 01/01/2018   ALKPHOS 95 01/01/2018   BILITOT 0.4 01/01/2018   GFRNONAA 56 (L) 01/01/2018   GFRAA >60 01/01/2018    Lab Results  Component Value Date   WBC 9.3 01/01/2018   NEUTROABS 7.6 01/01/2018   HGB 8.8 (L) 01/01/2018   HCT 28.6 (L) 01/01/2018   MCV 76.7 (L) 01/01/2018   PLT 141 (L) 01/01/2018     STUDIES: Dg Chest 2 View  Result Date: 12/29/2017 CLINICAL DATA:  Weakness, stage IV lung  cancer. Intermittent hypotension. Currently being treated with chemotherapy and radiation therapy. History of malignant pleural effusion, pneumonia, COPD, chest tube insertion. EXAM: CHEST - 2 VIEW COMPARISON:  Chest x-ray dated 09/27/2017. FINDINGS: Heart size and mediastinal contours are stable. Mild scarring/atelectasis at the LEFT lung base, stable. No pleural effusions seen. LEFT twelfth wall Port-A-Cath in place, stable in position with tip at the level of the upper SVC. No acute or suspicious osseous finding. A destructive lesion was identified in the LEFT fourth rib on earlier PET-CT, but this is not able to be visualized by plain film. IMPRESSION: No active cardiopulmonary disease. No evidence of pneumonia or pulmonary edema. No pleural effusion seen. Electronically Signed   By: Franki Cabot M.D.   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  PLAN:    1. 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  She is noted to have a new large right adrenal metastatic lesion as well as a new central pancreatic body metastatic lesion.  Left adrenal lesion is bigger.  The dominant left lower paraesophageal mass has also increased in size.  Beryle Flock has been discontinued.  Previously, hospice and end-of-life care were discussed, but patient is not ready to make this decision.  Patient wishes to delay any further chemotherapy until she completes her palliative XRT on January 11, 2018.  Return to clinic in 2 weeks for further evaluation and treatment planning.  Would reinstitute carboplatinum and gemcitabine if patient wishes to pursue additional treatments.  2.  Difficulty swallowing: Improving.  Possibly secondary to paraesophageal mass.  Continue daily XRT, completing on January 11, 2018.   3.  Flank/chest pain: Patient does not complain of this today.   Continue current narcotic regimen as prescribed.   4.  Renal insufficiency: Resolved. 5.  Thrombocytopenia: Resolved. 6.  Anemia: Patient's hemoglobin has trended down slightly to 8.8.  Monitor. 7.  Neutropenia: Resolved. 8.  Hypotension: Proceed with 1 L IV fluids today. 9.  Hypomagnesia: Patient will receive 4 g IV magnesium today. 10.  UTI: Continue current antibiotics as prescribed.   Patient expressed understanding and was  in agreement with this plan. She also understands that She can call clinic at any time with any questions, concerns, or complaints.   Cancer Staging Squamous cell carcinoma lung, left ([place]) Staging form: Lung, AJCC 8th Edition - Clinical stage from 12/13/2016: Stage IVA (cT4, cN0, cM1a) - Signed by [name], MD on 12/13/2016   [name], MD   01/01/2018 1:09 PM

## 2018-01-02 ENCOUNTER — Ambulatory Visit
Admission: RE | Admit: 2018-01-02 | Discharge: 2018-01-02 | Disposition: A | Discharge status: Home / Self Care | Payer: Medicare HMO | Source: Ambulatory Visit | Attending: Radiation Oncology | Admitting: Radiation Oncology

## 2018-01-02 DIAGNOSIS — Z510 Encounter for antineoplastic radiation therapy: Secondary | ICD-10-CM | POA: Diagnosis not present

## 2018-01-03 ENCOUNTER — Ambulatory Visit
Admission: RE | Admit: 2018-01-03 | Discharge: 2018-01-03 | Disposition: A | Discharge status: Home / Self Care | Payer: Medicare HMO | Source: Ambulatory Visit | Attending: Radiation Oncology | Admitting: Radiation Oncology

## 2018-01-03 DIAGNOSIS — Z510 Encounter for antineoplastic radiation therapy: Secondary | ICD-10-CM | POA: Diagnosis not present

## 2018-01-03 LAB — CULTURE, BLOOD (ROUTINE X 2)
CULTURE: NO GROWTH
Culture: NO GROWTH
SPECIAL REQUESTS: ADEQUATE

## 2018-01-04 ENCOUNTER — Ambulatory Visit
Admission: RE | Admit: 2018-01-04 | Discharge: 2018-01-04 | Disposition: A | Discharge status: Home / Self Care | Payer: Medicare HMO | Source: Ambulatory Visit | Attending: Radiation Oncology | Admitting: Radiation Oncology

## 2018-01-04 DIAGNOSIS — Z510 Encounter for antineoplastic radiation therapy: Secondary | ICD-10-CM | POA: Diagnosis not present

## 2018-01-05 ENCOUNTER — Other Ambulatory Visit: Payer: Self-pay

## 2018-01-05 ENCOUNTER — Emergency Department
Admission: EM | Admit: 2018-01-05 | Discharge: 2018-01-05 | Disposition: A | Discharge status: Home / Self Care | Payer: Medicare HMO | Attending: Emergency Medicine | Admitting: Emergency Medicine

## 2018-01-05 DIAGNOSIS — Z85118 Personal history of other malignant neoplasm of bronchus and lung: Secondary | ICD-10-CM | POA: Insufficient documentation

## 2018-01-05 DIAGNOSIS — Z9049 Acquired absence of other specified parts of digestive tract: Secondary | ICD-10-CM | POA: Diagnosis not present

## 2018-01-05 DIAGNOSIS — E860 Dehydration: Secondary | ICD-10-CM | POA: Insufficient documentation

## 2018-01-05 DIAGNOSIS — Z87891 Personal history of nicotine dependence: Secondary | ICD-10-CM | POA: Diagnosis not present

## 2018-01-05 DIAGNOSIS — J449 Chronic obstructive pulmonary disease, unspecified: Secondary | ICD-10-CM | POA: Insufficient documentation

## 2018-01-05 DIAGNOSIS — R531 Weakness: Secondary | ICD-10-CM | POA: Diagnosis present

## 2018-01-05 LAB — URINALYSIS, COMPLETE (UACMP) WITH MICROSCOPIC
Bilirubin Urine: NEGATIVE
Glucose, UA: NEGATIVE mg/dL
Ketones, ur: NEGATIVE mg/dL
Nitrite: NEGATIVE
PH: 6.5 (ref 5.0–8.0)
Protein, ur: NEGATIVE mg/dL
RBC / HPF: NONE SEEN RBC/hpf (ref 0–5)
SPECIFIC GRAVITY, URINE: 1.015 (ref 1.005–1.030)

## 2018-01-05 LAB — BASIC METABOLIC PANEL
Anion gap: 9 (ref 5–15)
BUN: 21 mg/dL (ref 8–23)
CALCIUM: 8.5 mg/dL — AB (ref 8.9–10.3)
CO2: 24 mmol/L (ref 22–32)
CREATININE: 0.86 mg/dL (ref 0.44–1.00)
Chloride: 101 mmol/L (ref 98–111)
GFR calc Af Amer: >60 mL/min (ref >60)
GLUCOSE: 158 mg/dL — AB (ref 70–99)
Potassium: 3.9 mmol/L (ref 3.5–5.1)
SODIUM: 134 mmol/L — AB (ref 135–145)

## 2018-01-05 LAB — CBC
HCT: 27.6 % — ABNORMAL LOW (ref 36.0–46.0)
HEMOGLOBIN: 8.5 g/dL — AB (ref 12.0–15.0)
MCH: 24.4 pg — AB (ref 26.0–34.0)
MCHC: 30.8 g/dL (ref 30.0–36.0)
MCV: 79.1 fL — ABNORMAL LOW (ref 80.0–100.0)
PLATELETS: 133 10*3/uL — AB (ref 150–400)
RBC: 3.49 MIL/uL — ABNORMAL LOW (ref 3.87–5.11)
RDW: 19.3 % — AB (ref 11.5–15.5)
WBC: 12.9 10*3/uL — ABNORMAL HIGH (ref 4.0–10.5)
nRBC: 0 % (ref 0.0–0.2)

## 2018-01-05 LAB — MAGNESIUM: MAGNESIUM: 1.6 mg/dL — AB (ref 1.7–2.4)

## 2018-01-05 MED ORDER — SODIUM CHLORIDE 0.9 % IV SOLN
Freq: Once | INTRAVENOUS | Status: AC
  Administered 2018-01-05: 14:00:00 via INTRAVENOUS

## 2018-01-05 MED ORDER — HEPARIN SOD (PORK) LOCK FLUSH 10 UNIT/ML IV SOLN
INTRAVENOUS | Status: AC
  Filled 2018-01-05: qty 1

## 2018-01-05 MED ORDER — HEPARIN SOD (PORK) LOCK FLUSH 100 UNIT/ML IV SOLN
500.0000 [IU] | Freq: Once | INTRAVENOUS | Status: AC
  Administered 2018-01-05: 500 [IU] via INTRAVENOUS

## 2018-01-05 MED ORDER — HEPARIN SOD (PORK) LOCK FLUSH 100 UNIT/ML IV SOLN
INTRAVENOUS | Status: AC
  Administered 2018-01-05: 500 [IU] via INTRAVENOUS
  Filled 2018-01-05: qty 5

## 2018-01-05 MED ORDER — SODIUM CHLORIDE 0.9 % IV SOLN
1.0000 g | Freq: Once | INTRAVENOUS | Status: AC
  Administered 2018-01-05: 1 g via INTRAVENOUS
  Filled 2018-01-05: qty 10

## 2018-01-05 MED ORDER — MAGNESIUM SULFATE 2 GM/50ML IV SOLN
2.0000 g | Freq: Once | INTRAVENOUS | Status: AC
  Administered 2018-01-05: 2 g via INTRAVENOUS
  Filled 2018-01-05: qty 50

## 2018-01-05 NOTE — ED Triage Notes (Signed)
Pt states she is dehydrated and needs fluids. UTI last week. A&O. Family member states pt is weak and had a BP 68/31 at home. Stage 4 lung CA. Family member called MD and told her to come to ED.

## 2018-01-05 NOTE — ED Notes (Signed)
Port needle removed with no issue. Pt tolerated well.

## 2018-01-05 NOTE — ED Provider Notes (Signed)
 -----------------------------------------   6:32 PM on 01/05/2018 -----------------------------------------  Patient feels much better after 2 L IV fluids.  Vital signs are normal.  Orthostatics suggest some slight change in her hemodynamics with position, but not significant.  Patient states that when she stood up she did not feel lightheaded and feels much better and wants to go home immediately.  On discharge, advised her to return if she feels worse or has any other concerns.  She feels hungry and is eager to eat when she gets home.   Carrie Mew, MD 01/05/18 [phone]

## 2018-01-05 NOTE — ED Notes (Signed)
Left port accessed with no issue.

## 2018-01-05 NOTE — ED Provider Notes (Signed)
[facility] Emergency Department Provider Note       Time seen: ----------------------------------------- 1:43 PM on 01/05/2018 -----------------------------------------   I have reviewed the triage vital signs and the nursing notes.  HISTORY   Chief Complaint Hypotension    HPI [name] is a 84 y.o. female with a history of COPD, lung cancer, paroxysmal atrial fibrillation, pneumonia who presents to the ED for feeling like she is dehydrated.  Patient states she is dehydrated needs fluids.  She has a mass near her esophagus that causes difficulty swallowing.  She had a UTI last week, family reports she has been weak and had low blood pressure at home.  Reportedly she has stage IV lung cancer, family member called her doctor who told her to come to the ER for reevaluation.  Past Medical History:  Diagnosis Date  . COPD (chronic obstructive pulmonary disease) ([place])   . GU (gastric peptic ulcer)   . Lung cancer ([place])   . Malignant pleural effusion   . PAF (paroxysmal atrial fibrillation) ([place]) 10/2016   a. 10/2016 post thoracentesis ; b. 10/2016 Echo: EF 65-70%, mild LVH;  c. Amio/Eliquis initiated (CHA2DSVASc = 3).  . Pneumonia   . Wrist fracture 1993   bilateral    Patient Active Problem List   Diagnosis Date Noted  . UTI (urinary tract infection) 12/29/2017  . A-fib ([place]) 01/05/2017  . Chest pain 01/01/2017  . Cellulitis of trunk   . Lung cancer ([place]) 12/18/2016  . Goals of care, counseling/discussion 12/17/2016  . Squamous cell carcinoma lung, left ([place]) 12/12/2016  . Atrial fibrillation ([place])   . SOB (shortness of breath) 11/12/2016  . Non-pressure chronic ulcer of right thigh with fat layer exposed ([place]) 03/16/2015    Past Surgical History:  Procedure Laterality Date  . ABDOMINAL HYSTERECTOMY    . CATARACT EXTRACTION W/ INTRAOCULAR LENS  IMPLANT, BILATERAL    . CHEST TUBE INSERTION Left 12/18/2016   Procedure: CHEST TUBE INSERTION;   Surgeon: [name], MD;  Location: [facility] ORS;  Service: General;  Laterality: Left;  . CHOLECYSTECTOMY    . CLOSED REDUCTION WRIST FRACTURE Left 01/04/2017   Procedure: CLOSED REDUCTION WRIST;  Surgeon: Thornton Park, MD;  Location: [facility] ORS;  Service: Orthopedics;  Laterality: Left;  . PORTACATH PLACEMENT Left 12/18/2016   Procedure: INSERTION PORT-A-CATH;  Surgeon: [name], MD;  Location: [facility] ORS;  Service: General;  Laterality: Left;  . REMOVAL OF PLEURAL DRAINAGE CATHETER N/A 01/02/2017   Procedure: REMOVAL OF PLEURAL DRAINAGE CATHETER;  Surgeon: [name], MD;  Location: [facility] ORS;  Service: Thoracic;  Laterality: N/A;  . TONSILLECTOMY      Allergies Sulfa antibiotics and Levaquin [levofloxacin]  Social History Social History   Tobacco Use  . Smoking status: Former Smoker    Packs/day: 1.00    Years: 68.00    Pack years: 68.00    Last attempt to quit: 11/06/2016    Years since quitting: 1.1  . Smokeless tobacco: Never Used  Substance Use Topics  . Alcohol use: No  . Drug use: No   Review of Systems Constitutional: Negative for fever. Cardiovascular: Negative for chest pain. Respiratory: Negative for shortness of breath. Gastrointestinal: Negative for abdominal pain, vomiting and diarrhea. Genitourinary: Negative for dysuria. Musculoskeletal: Negative for back pain. Skin: Negative for rash. Neurological: Positive for generalized weakness  All systems negative/normal/unremarkable except as stated in the HPI  ____________________________________________   PHYSICAL EXAM:  VITAL SIGNS: ED Triage Vitals [01/05/18 1305]  Enc  Vitals Group     BP 112/62     Pulse Rate (!) 111     Resp 18     Temp 98.5 F (36.9 C)     Temp Source Oral     SpO2 100 %     Weight 147 lb (66.7 kg)     Height 5\' 3"  (1.6 m)     Head Circumference      Peak Flow      Pain Score 0     Pain Loc      Pain Edu?      Excl. in [place]?    Constitutional: Alert and oriented.   Chronically ill-appearing, no distress Eyes: Conjunctivae are normal. Normal extraocular movements. Cardiovascular: Rapid rate, regular rhythm. No murmurs, rubs, or gallops. Respiratory: Normal respiratory effort without tachypnea nor retractions. Breath sounds are clear and equal bilaterally. No wheezes/rales/rhonchi. Gastrointestinal: Soft and nontender. Normal bowel sounds Musculoskeletal: Nontender with normal range of motion in extremities. No lower extremity tenderness nor edema. Neurologic:  Normal speech and language. No gross focal neurologic deficits are appreciated.  Skin:  Skin is warm, dry and intact. No rash noted. Psychiatric: Mood and affect are normal. Speech and behavior are normal.  ____________________________________________  EKG: Interpreted by me.  Atrial fibrillation with a rate of 111, low voltage QRS, nonspecific ST segment changes, normal QT  ____________________________________________  ED COURSE:  As part of my medical decision making, I reviewed the following data within the [place] History obtained from family if available, nursing notes, old chart and ekg, as well as notes from prior ED visits. Patient presented for weakness and possible dehydration, we will assess with labs and imaging as indicated at this time.   Procedures ____________________________________________   LABS (pertinent positives/negatives)  Labs Reviewed  BASIC METABOLIC PANEL - Abnormal; Notable for the following components:      Result Value   Sodium 134 (*)    Glucose, Bld 158 (*)    Calcium 8.5 (*)    All other components within normal limits  CBC - Abnormal; Notable for the following components:   WBC 12.9 (*)    RBC 3.49 (*)    Hemoglobin 8.5 (*)    HCT 27.6 (*)    MCV 79.1 (*)    MCH 24.4 (*)    RDW 19.3 (*)    Platelets 133 (*)    All other components within normal limits  URINALYSIS, COMPLETE (UACMP) WITH MICROSCOPIC - Abnormal; Notable for the  following components:   Color, Urine STRAW (*)    APPearance HAZY (*)    Hgb urine dipstick TRACE (*)    Leukocytes, UA MODERATE (*)    Bacteria, UA RARE (*)    All other components within normal limits  MAGNESIUM - Abnormal; Notable for the following components:   Magnesium 1.6 (*)    All other components within normal limits  URINE CULTURE   ____________________________________________  DIFFERENTIAL DIAGNOSIS   Dehydration, electrolyte abnormality, occult infection, metastasis  FINAL ASSESSMENT AND PLAN  Weakness, dehydration, hypomagnesemia   Plan: The patient had presented for weakness. Patient's labs did reveal mild leukocytosis and low magnesium which has been a frequent problem for her.  We did give IV fluids and IV magnesium.  She does not want to stay in the hospital and has declined admission.  I will advise close outpatient follow-up.Laurence Aly, MD   Note: This note was generated in part or whole with voice  recognition software. Voice recognition is usually quite accurate but there are transcription errors that can and very often do occur. I apologize for any typographical errors that were not detected and corrected.     Earleen Newport, MD 01/05/18 [phone]

## 2018-01-05 NOTE — ED Notes (Signed)
Pt has port, only wants blood drawn from it. Denies wanting butterfly stick in triage.

## 2018-01-07 ENCOUNTER — Ambulatory Visit: Payer: Medicare HMO

## 2018-01-07 ENCOUNTER — Other Ambulatory Visit: Payer: Self-pay | Admitting: Oncology

## 2018-01-07 LAB — URINE CULTURE: Special Requests: NORMAL

## 2018-01-08 ENCOUNTER — Telehealth: Payer: Self-pay | Admitting: *Deleted

## 2018-01-08 ENCOUNTER — Ambulatory Visit: Payer: Medicare HMO

## 2018-01-08 NOTE — Telephone Encounter (Signed)
Daughter called reporting that patient had UTI and was on antibiotics ( seen in ER for this) She reporets antibiotics are complete and the patient still has UTI, her urine remains foul smelling. She requests we order more antibiotics for patient. Urine repeated 01/05/18 in ER : Status:  Final result  Visible to patient:  Yes (MyChart)  Next appt:  01/10/2018 at 03:30 PM in Oncology [name], MD)  Specimen Information: Urine, Random     Component 3d ago  Specimen Description URINE, RANDOM  Performed at [facility], [address], [place], [place] [address]   Special Requests Normal  Performed at [facility], [address] [place], [place] [address]   [place]    Report Status 01/07/2018 FINAL   Resulting Agency [facility] LAB      Specimen Collected: 01/05/18 15:11  Last Resulted: 01/07/18 08:33       Please advise

## 2018-01-09 ENCOUNTER — Ambulatory Visit: Payer: Medicare HMO

## 2018-01-09 NOTE — Telephone Encounter (Signed)
Daughter informed of NP response and states that patient has appointment tomorrow anyway and this can be addressed then

## 2018-01-09 NOTE — Telephone Encounter (Signed)
Discussed with Beckey Rutter, NP and she said patient needs to come in for evaluation and repeat urine testing before prescribing more antibiotics.

## 2018-01-10 ENCOUNTER — Encounter: Payer: Self-pay | Admitting: Oncology

## 2018-01-10 ENCOUNTER — Inpatient Hospital Stay (HOSPITAL_BASED_OUTPATIENT_CLINIC_OR_DEPARTMENT_OTHER): Payer: Medicare HMO | Admitting: Oncology

## 2018-01-10 ENCOUNTER — Ambulatory Visit: Payer: Medicare HMO

## 2018-01-10 ENCOUNTER — Inpatient Hospital Stay (HOSPITAL_BASED_OUTPATIENT_CLINIC_OR_DEPARTMENT_OTHER): Payer: Medicare HMO | Admitting: Hospice and Palliative Medicine

## 2018-01-10 ENCOUNTER — Other Ambulatory Visit: Payer: Self-pay

## 2018-01-10 ENCOUNTER — Inpatient Hospital Stay: Payer: Medicare HMO | Admitting: Oncology

## 2018-01-10 VITALS — BP 89/67 | HR 105 | Temp 97.2°F | Resp 16 | Ht 63.0 in | Wt 141.6 lb

## 2018-01-10 DIAGNOSIS — Z515 Encounter for palliative care: Secondary | ICD-10-CM | POA: Diagnosis not present

## 2018-01-10 DIAGNOSIS — R530 Neoplastic (malignant) related fatigue: Secondary | ICD-10-CM

## 2018-01-10 DIAGNOSIS — R1310 Dysphagia, unspecified: Secondary | ICD-10-CM

## 2018-01-10 DIAGNOSIS — Z9221 Personal history of antineoplastic chemotherapy: Secondary | ICD-10-CM

## 2018-01-10 DIAGNOSIS — Z7901 Long term (current) use of anticoagulants: Secondary | ICD-10-CM

## 2018-01-10 DIAGNOSIS — E46 Unspecified protein-calorie malnutrition: Secondary | ICD-10-CM

## 2018-01-10 DIAGNOSIS — C7971 Secondary malignant neoplasm of right adrenal gland: Secondary | ICD-10-CM | POA: Diagnosis not present

## 2018-01-10 DIAGNOSIS — C3492 Malignant neoplasm of unspecified part of left bronchus or lung: Secondary | ICD-10-CM

## 2018-01-10 DIAGNOSIS — R079 Chest pain, unspecified: Secondary | ICD-10-CM

## 2018-01-10 DIAGNOSIS — Z8249 Family history of ischemic heart disease and other diseases of the circulatory system: Secondary | ICD-10-CM

## 2018-01-10 DIAGNOSIS — Z87891 Personal history of nicotine dependence: Secondary | ICD-10-CM

## 2018-01-10 DIAGNOSIS — E860 Dehydration: Secondary | ICD-10-CM

## 2018-01-10 DIAGNOSIS — R109 Unspecified abdominal pain: Secondary | ICD-10-CM

## 2018-01-10 DIAGNOSIS — I951 Orthostatic hypotension: Secondary | ICD-10-CM

## 2018-01-10 DIAGNOSIS — I480 Paroxysmal atrial fibrillation: Secondary | ICD-10-CM

## 2018-01-10 DIAGNOSIS — R531 Weakness: Secondary | ICD-10-CM

## 2018-01-10 DIAGNOSIS — Z79899 Other long term (current) drug therapy: Secondary | ICD-10-CM

## 2018-01-10 DIAGNOSIS — Z66 Do not resuscitate: Secondary | ICD-10-CM | POA: Diagnosis not present

## 2018-01-10 NOTE — Progress Notes (Signed)
[place]  [phone] [phone]   Name: [name] Date: 01/10/2018 MRN: [id number]  DOB: 10-17-1932  Patient Care Team: [name], MD as PCP - General (Oncology) Telford Nab, RN as Registered Nurse    REASON FOR CONSULTATION: Palliative Care consult requested for this 84 y.o. female with multiple medical problems including stage IV squamous cell carcinoma of the lung who is status post chemotherapy.  She was seen today in the clinic for follow-up visit with Dr. Grayland Ormond for consideration of future treatment.  However, patient has decided to forego future chemotherapy and instead would like to pursue hospice care.  Palliative care was requested to discuss goals of care.   SOCIAL HISTORY:    Patient lives at home with daughter.  She also several granddaughters who are involved.  ADVANCE DIRECTIVES:  Unknown  CODE STATUS: DNR  PAST MEDICAL HISTORY: Past Medical History:  Diagnosis Date  . COPD (chronic obstructive pulmonary disease) ([place])   . GU (gastric peptic ulcer)   . Lung cancer ([place])   . Malignant pleural effusion   . PAF (paroxysmal atrial fibrillation) ([place]) 10/2016   a. 10/2016 post thoracentesis ; b. 10/2016 Echo: EF 65-70%, mild LVH;  c. Amio/Eliquis initiated (CHA2DSVASc = 3).  . Pneumonia   . Wrist fracture 1993   bilateral    PAST SURGICAL HISTORY:  Past Surgical History:  Procedure Laterality Date  . ABDOMINAL HYSTERECTOMY    . CATARACT EXTRACTION W/ INTRAOCULAR LENS  IMPLANT, BILATERAL    . CHEST TUBE INSERTION Left 12/18/2016   Procedure: CHEST TUBE INSERTION;  Surgeon: [name], MD;  Location: [facility] ORS;  Service: General;  Laterality: Left;  . CHOLECYSTECTOMY    . CLOSED REDUCTION WRIST FRACTURE Left 01/04/2017   Procedure: CLOSED REDUCTION WRIST;  Surgeon: Thornton Park, MD;  Location: [facility] ORS;  Service: Orthopedics;  Laterality: Left;  . PORTACATH PLACEMENT  Left 12/18/2016   Procedure: INSERTION PORT-A-CATH;  Surgeon: [name], MD;  Location: [facility] ORS;  Service: General;  Laterality: Left;  . REMOVAL OF PLEURAL DRAINAGE CATHETER N/A 01/02/2017   Procedure: REMOVAL OF PLEURAL DRAINAGE CATHETER;  Surgeon: [name], MD;  Location: [facility] ORS;  Service: Thoracic;  Laterality: N/A;  . TONSILLECTOMY      HEMATOLOGY/ONCOLOGY HISTORY:    Squamous cell carcinoma lung, left ([place])   12/12/2016 Initial Diagnosis    Squamous cell carcinoma lung, left ([place])    12/04/2017 -  Chemotherapy    The patient had palonosetron (ALOXI) injection 0.25 mg, 0.25 mg, Intravenous,  Once, 1 of 4 cycles Administration: 0.25 mg (12/11/2017) CARBOplatin (PARAPLATIN) 350 mg in sodium chloride 0.9 % 250 mL chemo infusion, 350 mg (108 % of original dose 327 mg), Intravenous,  Once, 1 of 4 cycles Dose modification:   (original dose 327 mg, Cycle 1) Administration: 350 mg (12/11/2017) gemcitabine (GEMZAR) 1,800 mg in sodium chloride 0.9 % 250 mL chemo infusion, 1,748 mg, Intravenous,  Once, 1 of 4 cycles Administration: 1,800 mg (12/11/2017)  for chemotherapy treatment.      ALLERGIES:  is allergic to sulfa antibiotics and levaquin [levofloxacin].  MEDICATIONS:  Current Outpatient Medications  Medication Sig Dispense Refill  . amiodarone (PACERONE) 200 MG tablet Take 1 tablet (200 mg total) by mouth 2 (two) times daily.  11  . apixaban (ELIQUIS) 5 MG TABS tablet Take 1 tablet (5 mg total) by mouth 2 (two) times daily. 60 tablet 0  . BIOTIN PO Take by  mouth daily.    . cetirizine (ZYRTEC) 10 MG tablet Take 10 mg by mouth daily as needed for allergies.    . cholecalciferol (VITAMIN D) 1000 units tablet Take 1,000 Units by mouth daily.    Marland Kitchen dexamethasone (DECADRON) 4 MG tablet Take 1 tablet (4 mg total) by mouth daily. 30 tablet 2  . diltiazem (CARDIZEM CD) 120 MG 24 hr capsule Take 1 capsule (120 mg total) by mouth daily.    Marland Kitchen escitalopram (LEXAPRO) 5 MG tablet Take 5  mg by mouth daily.    . fentaNYL (DURAGESIC - DOSED MCG/HR) 25 MCG/HR patch Place 1 patch (25 mcg total) onto the skin every 3 (three) days. 10 patch 0  . lidocaine-prilocaine (EMLA) cream APPLY TO AFFECTED AREA ONCE  3  . metoCLOPramide (REGLAN) 10 MG tablet TAKE 1 TABLET (10 MG TOTAL) BY MOUTH 4 (FOUR) TIMES DAILY. 120 tablet 0  . mirtazapine (REMERON) 7.5 MG tablet Take 7.5 mg by mouth at bedtime.    . Multiple Vitamins-Minerals (MULTIVITAMIN ADULT PO) Take by mouth.    Marland Kitchen omeprazole (PRILOSEC) 20 MG capsule TAKE 1 CAPSULE EVERY DAY 90 capsule 2  . ondansetron (ZOFRAN) 8 MG tablet TAKE 1 TABLET TWO TIMES DAILY AS NEEDED FOR REFRACTORY NAUSEA OR VOMITING.  120 tablet 0  . potassium chloride SA (K-DUR,KLOR-CON) 20 MEQ tablet Take 20 mEq 2 (two) times daily by mouth.     No current facility-administered medications for this visit.    Facility-Administered Medications Ordered in Other Visits  Medication Dose Route Frequency Provider Last Rate Last Dose  . heparin lock flush 100 unit/mL  500 Units Intravenous Once [name], MD      . sodium chloride flush (NS) 0.9 % injection 10 mL  10 mL Intravenous Once [name], MD        VITAL SIGNS: There were no vitals taken for this visit. There were no vitals filed for this visit.  Estimated body mass index is 25.08 kg/m as calculated from the following:   Height as of an earlier encounter on 01/10/18: 5' 3"  (1.6 m).   Weight as of an earlier encounter on 01/10/18: 141 lb 9.6 oz (64.2 kg).  LABS: CBC:    Component Value Date/Time   WBC 12.9 (H) 01/05/2018 1421   HGB 8.5 (L) 01/05/2018 1421   HCT 27.6 (L) 01/05/2018 1421   PLT 133 (L) 01/05/2018 1421   MCV 79.1 (L) 01/05/2018 1421   NEUTROABS 7.6 01/01/2018 0955   LYMPHSABS 0.4 (L) 01/01/2018 0955   MONOABS 0.8 01/01/2018 0955   EOSABS 0.0 01/01/2018 0955   BASOSABS 0.0 01/01/2018 0955   Comprehensive Metabolic Panel:    Component Value Date/Time   NA 134 (L)  01/05/2018 1421   K 3.9 01/05/2018 1421   CL 101 01/05/2018 1421   CO2 24 01/05/2018 1421   BUN 21 01/05/2018 1421   CREATININE 0.86 01/05/2018 1421   GLUCOSE 158 (H) 01/05/2018 1421   CALCIUM 8.5 (L) 01/05/2018 1421   AST 18 01/01/2018 0955   ALT 10 01/01/2018 0955   ALKPHOS 95 01/01/2018 0955   BILITOT 0.4 01/01/2018 0955   PROT 5.5 (L) 01/01/2018 0955   ALBUMIN 2.6 (L) 01/01/2018 0955    RADIOGRAPHIC STUDIES: Dg Chest 2 View  Result Date: 12/29/2017 CLINICAL DATA:  Weakness, stage IV lung cancer. Intermittent hypotension. Currently being treated with chemotherapy and radiation therapy. History of malignant pleural effusion, pneumonia, COPD, chest tube insertion. EXAM: CHEST - 2 VIEW  COMPARISON:  Chest x-ray dated 09/27/2017. FINDINGS: Heart size and mediastinal contours are stable. Mild scarring/atelectasis at the LEFT lung base, stable. No pleural effusions seen. LEFT twelfth wall Port-A-Cath in place, stable in position with tip at the level of the upper SVC. No acute or suspicious osseous finding. A destructive lesion was identified in the LEFT fourth rib on earlier PET-CT, but this is not able to be visualized by plain film. IMPRESSION: No active cardiopulmonary disease. No evidence of pneumonia or pulmonary edema. No pleural effusion seen. Electronically Signed   By: Franki Cabot M.D.   On: 12/29/2017 18:08    PERFORMANCE STATUS (ECOG) : 4 - Bedbound  Review of Systems As noted above. Otherwise, a complete review of systems is negative.  Physical Exam General: Frail-appearing Cardiovascular: regular rate and rhythm Pulmonary: clear ant fields, unlabored Abdomen: soft, nontender, + bowel sounds Extremities: no edema, no joint deformities Skin: no rashes Neurological: Weakness but otherwise nonfocal  IMPRESSION: I met with patient, daughter, and granddaughter.  Patient confirms that she is no longer interested in current or future treatment of the lung cancer.  She states  clearly that she understands that this may shorten her life expectancy.  However, patient says that she is more interested in staying home and focusing on a quality of life versus quantity of life.  She says she is tired of coming to the cancer center and would like to initiate home services to focus on comfort.  We discussed the option of hospice in detail and both family and patient agree with that plan.  Will send referral for hospice today.  Symptomatically, patient generally feels poor but does not elucidate on specifics.  She does endorse a declining performance status over the past weeks and is now mostly in the chair or bed.  She lives at home with family but is increasingly reliant on family for support with activities of daily living.    Oral intake has also been poor.  Patient has had issues with hypotension from dehydration.  Family asked about her receiving ongoing IV fluids.  We talked at length about her disease process and symptoms associated with decline.  Family and patient both say they recognize that IV fluids are generally not indicated as patient's approach end of life.  CODE STATUS discussed.  Patient confirms that she would want to be a DO NOT RESUSCITATE.  She is not interested in having her life prolonged artificially.  She would not want to be sustained on machines.  I completed a DNR order.   I completed a MOST form today. The patient and family outlined their wishes for the following treatment decisions:  Cardiopulmonary Resuscitation: Do Not Attempt Resuscitation (DNR/No CPR)  Medical Interventions: Comfort Measures: Keep clean, warm, and dry. Use medication by any route, positioning, wound care, and other measures to relieve pain and suffering. Use oxygen, suction and manual treatment of airway obstruction as needed for comfort. Do not transfer to the hospital unless comfort needs cannot be met in current location.  Antibiotics: Determine use of limitation of antibiotics  when infection occurs  IV Fluids: IV fluids for a defined trial period  Feeding Tube: No feeding tube    PLAN: Hospice referral DNR Most form completed as outlined above Patient's primary goal is to focus on her comfort Patient request future cancer center visits to be canceled   Patient expressed understanding and was in agreement with this plan. She also understands that She can call clinic at any  time with any questions, concerns, or complaints.    Time Total: 40 minutes  Visit consisted of counseling and education dealing with the complex and emotionally intense issues of symptom management and palliative care in the setting of serious and potentially life-threatening illness.Greater than 50%  of this time was spent counseling and coordinating care related to the above assessment and plan.  Signed by: Altha Harm, [place], NP-C, [place] [facility] (Work Cell)

## 2018-01-10 NOTE — Progress Notes (Signed)
[place]  [phone] Fax:(336) [phone]  ID: [name] OB: [birth year]  MR#: [id number]  JKD#:[id number]  Patient Care Team: [name], MD as PCP - General (Oncology) Telford Nab, RN as Registered Nurse  CHIEF COMPLAINT: Stage IVA squamous cell carcinoma of the left lung.  INTERVAL HISTORY: Patient returns to clinic today for further evaluation and discussion on whether or not to continue her treatments.  Her performance status continues to be significantly declined.  She has a poor appetite.  She continues to have difficulty swallowing.  She has no neurologic complaints.  She does not complain of shortness of breath or chest pain today.  She denies any nausea, vomiting, constipation, or diarrhea. She has no urinary complaints.  Patient feels generally terrible, but offers no further specific complaints today.    REVIEW OF SYSTEMS:   Review of Systems  Constitutional: Positive for malaise/fatigue. Negative for fever and weight loss.  HENT: Negative.  Negative for sore throat.   Respiratory: Negative.  Negative for cough, hemoptysis and shortness of breath.   Cardiovascular: Negative.  Negative for chest pain and leg swelling.  Gastrointestinal: Negative.  Negative for abdominal pain and diarrhea.  Genitourinary: Negative.  Negative for flank pain.  Musculoskeletal: Positive for back pain. Negative for falls and joint pain.  Skin: Negative.  Negative for itching and rash.  Neurological: Positive for weakness. Negative for sensory change, focal weakness and headaches.  Psychiatric/Behavioral: Negative.  The patient is not nervous/anxious and does not have insomnia.     As per HPI. Otherwise, a complete review of systems is negative.  PAST MEDICAL HISTORY: Past Medical History:  Diagnosis Date  . COPD (chronic obstructive pulmonary disease) ([place])   . GU (gastric peptic ulcer)   . Lung cancer ([place])   . Malignant pleural effusion   .  PAF (paroxysmal atrial fibrillation) ([place]) 10/2016   a. 10/2016 post thoracentesis ; b. 10/2016 Echo: EF 65-70%, mild LVH;  c. Amio/Eliquis initiated (CHA2DSVASc = 3).  . Pneumonia   . Wrist fracture 1993   bilateral    PAST SURGICAL HISTORY: Past Surgical History:  Procedure Laterality Date  . ABDOMINAL HYSTERECTOMY    . CATARACT EXTRACTION W/ INTRAOCULAR LENS  IMPLANT, BILATERAL    . CHEST TUBE INSERTION Left 12/18/2016   Procedure: CHEST TUBE INSERTION;  Surgeon: [name], MD;  Location: [facility] ORS;  Service: General;  Laterality: Left;  . CHOLECYSTECTOMY    . CLOSED REDUCTION WRIST FRACTURE Left 01/04/2017   Procedure: CLOSED REDUCTION WRIST;  Surgeon: Thornton Park, MD;  Location: [facility] ORS;  Service: Orthopedics;  Laterality: Left;  . PORTACATH PLACEMENT Left 12/18/2016   Procedure: INSERTION PORT-A-CATH;  Surgeon: [name], MD;  Location: [facility] ORS;  Service: General;  Laterality: Left;  . REMOVAL OF PLEURAL DRAINAGE CATHETER N/A 01/02/2017   Procedure: REMOVAL OF PLEURAL DRAINAGE CATHETER;  Surgeon: [name], MD;  Location: [facility] ORS;  Service: Thoracic;  Laterality: N/A;  . TONSILLECTOMY      FAMILY HISTORY: Family History  Problem Relation Age of Onset  . Hypertension Father   . Heart disease Father   . Heart attack Father   . Congestive Heart Failure Mother   . Stroke Mother   . Atrial fibrillation Sister     ADVANCED DIRECTIVES (Y/N):  N  HEALTH MAINTENANCE: Social History   Tobacco Use  . Smoking status: Former Smoker    Packs/day: 1.00    Years: 68.00    Pack years: 68.00  Last attempt to quit: 11/06/2016    Years since quitting: 1.1  . Smokeless tobacco: Never Used  Substance Use Topics  . Alcohol use: No  . Drug use: No     Colonoscopy:  PAP:  Bone density:  Lipid panel:  Allergies  Allergen Reactions  . Sulfa Antibiotics Other (See Comments)    Seizures   . Levaquin [Levofloxacin] Hives and Itching    Current Outpatient  Medications  Medication Sig Dispense Refill  . amiodarone (PACERONE) 200 MG tablet Take 1 tablet (200 mg total) by mouth 2 (two) times daily.  11  . apixaban (ELIQUIS) 5 MG TABS tablet Take 1 tablet (5 mg total) by mouth 2 (two) times daily. 60 tablet 0  . BIOTIN PO Take by mouth daily.    . cetirizine (ZYRTEC) 10 MG tablet Take 10 mg by mouth daily as needed for allergies.    . cholecalciferol (VITAMIN D) 1000 units tablet Take 1,000 Units by mouth daily.    Marland Kitchen dexamethasone (DECADRON) 4 MG tablet Take 1 tablet (4 mg total) by mouth daily. 30 tablet 2  . diltiazem (CARDIZEM CD) 120 MG 24 hr capsule Take 1 capsule (120 mg total) by mouth daily.    Marland Kitchen escitalopram (LEXAPRO) 5 MG tablet Take 5 mg by mouth daily.    . fentaNYL (DURAGESIC - DOSED MCG/HR) 25 MCG/HR patch Place 1 patch (25 mcg total) onto the skin every 3 (three) days. 10 patch 0  . lidocaine-prilocaine (EMLA) cream APPLY TO AFFECTED AREA ONCE  3  . metoCLOPramide (REGLAN) 10 MG tablet TAKE 1 TABLET (10 MG TOTAL) BY MOUTH 4 (FOUR) TIMES DAILY. 120 tablet 0  . mirtazapine (REMERON) 7.5 MG tablet Take 7.5 mg by mouth at bedtime.    . Multiple Vitamins-Minerals (MULTIVITAMIN ADULT PO) Take by mouth.    Marland Kitchen omeprazole (PRILOSEC) 20 MG capsule TAKE 1 CAPSULE EVERY DAY 90 capsule 2  . ondansetron (ZOFRAN) 8 MG tablet TAKE 1 TABLET TWO TIMES DAILY AS NEEDED FOR REFRACTORY NAUSEA OR VOMITING.  120 tablet 0  . potassium chloride SA (K-DUR,KLOR-CON) 20 MEQ tablet Take 20 mEq 2 (two) times daily by mouth.     No current facility-administered medications for this visit.    Facility-Administered Medications Ordered in Other Visits  Medication Dose Route Frequency Provider Last Rate Last Dose  . heparin lock flush 100 unit/mL  500 Units Intravenous Once [name], MD      . sodium chloride flush (NS) 0.9 % injection 10 mL  10 mL Intravenous Once [name], MD        OBJECTIVE: Vitals:   01/10/18 1022 01/10/18 1036  BP:  (!)  89/67  Pulse:  (!) 105  Resp: 16   Temp:  (!) 97.2 F (36.2 C)     Body mass index is 25.08 kg/m.    ECOG FS:3 - Symptomatic, >50% confined to bed  General: Thin, no acute distress.  Sitting in a wheelchair. Eyes: Pink conjunctiva, anicteric sclera. HEENT: Normocephalic, moist mucous membranes, clear oropharnyx. Lungs: Clear to auscultation bilaterally. Heart: Regular rate and rhythm. No rubs, murmurs, or gallops. Abdomen: Soft, nontender, nondistended. No organomegaly noted, normoactive bowel sounds. Musculoskeletal: No edema, cyanosis, or clubbing. Neuro: Alert, answering all questions appropriately. Cranial nerves grossly intact. Skin: No rashes or petechiae noted. Psych: Normal affect.  LAB RESULTS:  Lab Results  Component Value Date   NA 134 (L) 01/05/2018   K 3.9 01/05/2018   CL 101 01/05/2018  CO2 24 01/05/2018   GLUCOSE 158 (H) 01/05/2018   BUN 21 01/05/2018   CREATININE 0.86 01/05/2018   CALCIUM 8.5 (L) 01/05/2018   PROT 5.5 (L) 01/01/2018   ALBUMIN 2.6 (L) 01/01/2018   AST 18 01/01/2018   ALT 10 01/01/2018   ALKPHOS 95 01/01/2018   BILITOT 0.4 01/01/2018   GFRNONAA >60 01/05/2018   GFRAA >60 01/05/2018    Lab Results  Component Value Date   WBC 12.9 (H) 01/05/2018   NEUTROABS 7.6 01/01/2018   HGB 8.5 (L) 01/05/2018   HCT 27.6 (L) 01/05/2018   MCV 79.1 (L) 01/05/2018   PLT 133 (L) 01/05/2018     STUDIES: Dg Chest 2 View  Result Date: 12/29/2017 CLINICAL DATA:  Weakness, stage IV lung cancer. Intermittent hypotension. Currently being treated with chemotherapy and radiation therapy. History of malignant pleural effusion, pneumonia, COPD, chest tube insertion. EXAM: CHEST - 2 VIEW COMPARISON:  Chest x-ray dated 09/27/2017. FINDINGS: Heart size and mediastinal contours are stable. Mild scarring/atelectasis at the LEFT lung base, stable. No pleural effusions seen. LEFT twelfth wall Port-A-Cath in place, stable in position with tip at the level of the upper  SVC. No acute or suspicious osseous finding. A destructive lesion was identified in the LEFT fourth rib on earlier PET-CT, but this is not able to be visualized by plain film. IMPRESSION: No active cardiopulmonary disease. No evidence of pneumonia or pulmonary edema. No pleural effusion seen. Electronically Signed   By: Franki Cabot M.D.   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  PLAN:    1. 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  She is noted to have a new large right adrenal metastatic lesion as well as a new central pancreatic body metastatic lesion.  Left adrenal lesion is bigger.  The dominant left lower paraesophageal mass has also increased in size.  Beryle Flock was been discontinued.  Given patient's declining performance status, she wishes to complete her XRT and then discontinue all treatments.  She is agreed to pursue hospice care.  Patient also met with palliative care at length today.  No further intervention is needed.  No follow-up has been scheduled at this time.   2.  Difficulty swallowing: Improving.  Possibly secondary to paraesophageal mass.  Continue daily XRT, completing on January 11, 2018.  Transition to hospice as above. 3.  Flank/chest pain: Improved.  Possibly related to progressive malignancy.  Continue current narcotic regimen as prescribed.   4.  Hypotension: Secondary to poor p.o. intake.  Hospice as above.  I spent a total of 30 minutes face-to-face with the patient of which greater than 50% of the visit was spent in counseling and coordination of care as detailed above.   Patient expressed understanding and was in agreement with this plan. She also understands that She can call clinic at any time with any questions, concerns, or complaints.   Cancer Staging Squamous cell carcinoma lung, left ([place]) Staging  form: Lung, AJCC 8th Edition - Clinical stage from 12/13/2016: Stage IVA (cT4, cN0, cM1a) - Signed by [name], MD on 12/13/2016   [name], MD   01/14/2018 3:05 PM

## 2018-01-10 NOTE — Progress Notes (Signed)
Patient here for follow up. She has lost 6 pounds since her last visit, she has no appetite and states that everything tastes bad. She has stopped radiation and is requesting hospice care.

## 2018-01-11 ENCOUNTER — Ambulatory Visit: Payer: Medicare HMO

## 2018-01-14 ENCOUNTER — Other Ambulatory Visit: Payer: Self-pay | Admitting: *Deleted

## 2018-01-14 ENCOUNTER — Ambulatory Visit: Payer: Medicare HMO

## 2018-01-14 NOTE — Telephone Encounter (Signed)
Yes to to both meds.  And I guess a chair lift too, but is there another option?

## 2018-01-14 NOTE — Telephone Encounter (Signed)
Patient now under hospice care who will address concerns.

## 2018-01-14 NOTE — Telephone Encounter (Signed)
Call from [facility] nurse that patient pain is not controlled with Fentanyl 25 and is asking for an increase in medicine. Also reports that patient has vaginal irritation and wants prescription for Mycolog sent to [place].  The I received a call from the SW at hospice asking for na order for a lift chair to be sent in

## 2018-01-15 ENCOUNTER — Inpatient Hospital Stay: Payer: Medicare HMO

## 2018-01-15 ENCOUNTER — Inpatient Hospital Stay: Payer: Medicare HMO | Admitting: Oncology

## 2018-01-15 MED ORDER — FENTANYL 50 MCG/HR TD PT72: 50.0000 ug | MEDICATED_PATCH | TRANSDERMAL | 0 refills | Status: DC

## 2018-01-15 NOTE — Telephone Encounter (Signed)
50

## 2018-01-15 NOTE — Telephone Encounter (Signed)
How much Fentanyl do you want, 37 or 50

## 2018-01-22 ENCOUNTER — Telehealth: Payer: Self-pay | Admitting: *Deleted

## 2018-01-22 MED ORDER — LIDO-CAPSAICIN-MEN-METHYL SAL 4-0.025-5-20 % EX PTCH: 1.0000 | MEDICATED_PATCH | Freq: Two times a day (BID) | CUTANEOUS | 1 refills | Status: AC | PRN

## 2018-01-22 NOTE — Telephone Encounter (Signed)
Hospice Nurse Magda Paganini called asking we send in an order for Lidocaine 4 % patch sig 4 hours on , 12 hours off to [facility] on [address] for patient back pain. Please advise/ send rx

## 2018-01-22 NOTE — Telephone Encounter (Signed)
That's fine. Thanks!

## 2018-01-22 NOTE — Telephone Encounter (Signed)
Magda Paganini will call in patch

## 2018-01-25 ENCOUNTER — Other Ambulatory Visit: Payer: Self-pay | Admitting: *Deleted

## 2018-01-25 MED ORDER — MORPHINE SULFATE (CONCENTRATE) 20 MG/ML PO SOLN: ORAL | 0 refills | Status: AC

## 2018-01-25 MED ORDER — FENTANYL 50 MCG/HR TD PT72: 50.0000 ug | MEDICATED_PATCH | TRANSDERMAL | 0 refills | Status: AC

## 2018-01-29 ENCOUNTER — Telehealth: Payer: Self-pay | Admitting: *Deleted

## 2018-01-29 NOTE — Telephone Encounter (Signed)
Hospice nurse called to report that patient passed away at 11:20 PM 2018-02-22

## 2018-02-17 DEATH — deceased

## 2018-02-18 ENCOUNTER — Ambulatory Visit: Payer: Medicare HMO | Admitting: Radiation Oncology

## 2018-06-11 IMAGING — CT CT HEAD W/O CM
3 series · 16 of 46 positions shown, 19 images · non-contrast
Comparison: None.

CLINICAL DATA: Recent trip and fall with head injury, no neuro
deficit, initial encounter

EXAM:
CT HEAD WITHOUT CONTRAST
TECHNIQUE: Contiguous axial images were obtained from the base of the skull
through the vertex without intravenous contrast.

[Series 2: head wo · axial · 0.46mm/px · z∈[-113,+7]mm · 10 of 29 slices shown, 13 images]
[im 3/29  brain]
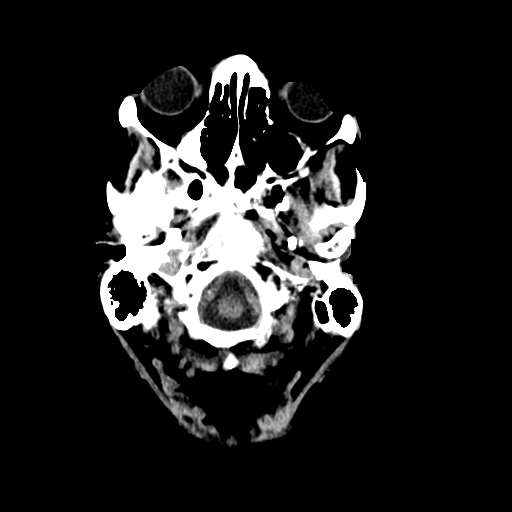
[im 3/29  bone]
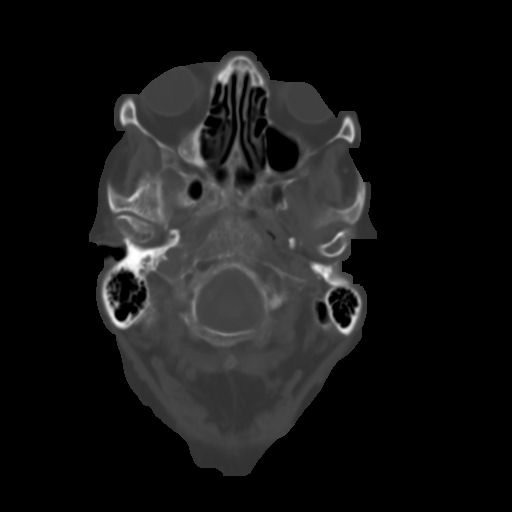
[im 6/29  brain]
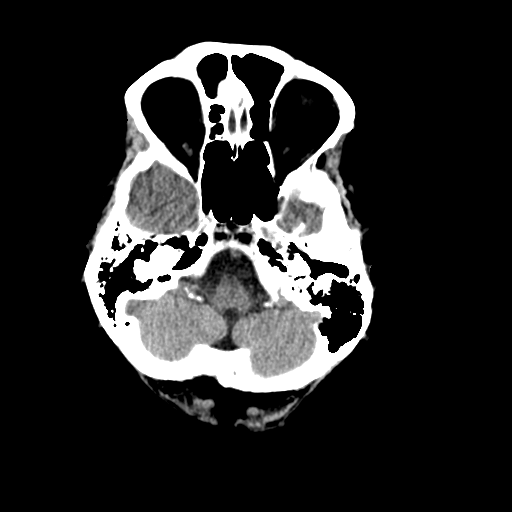
[im 8/29  brain]
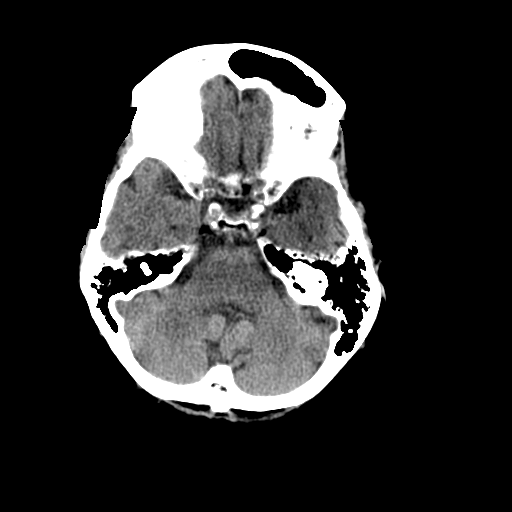
[im 11/29  brain]
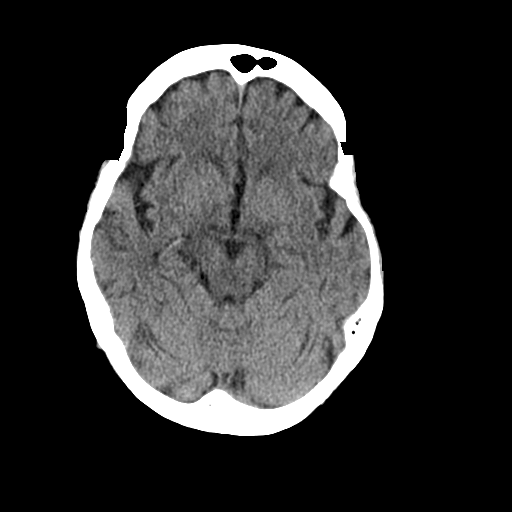
[im 14/29  brain]
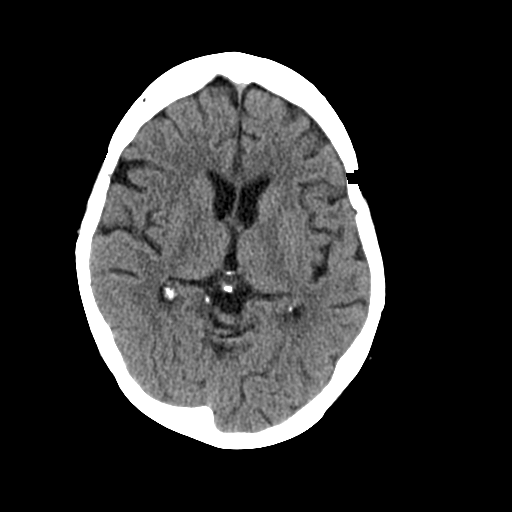
[im 14/29  bone]
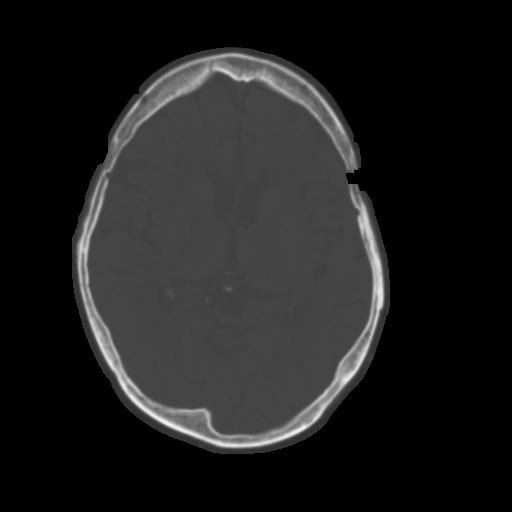
[im 16/29  brain]
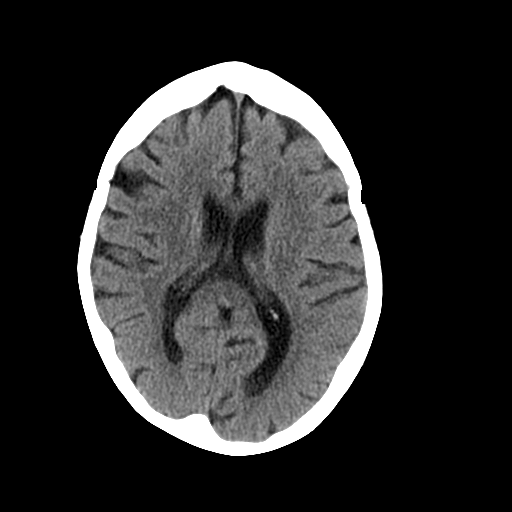
[im 19/29  brain]
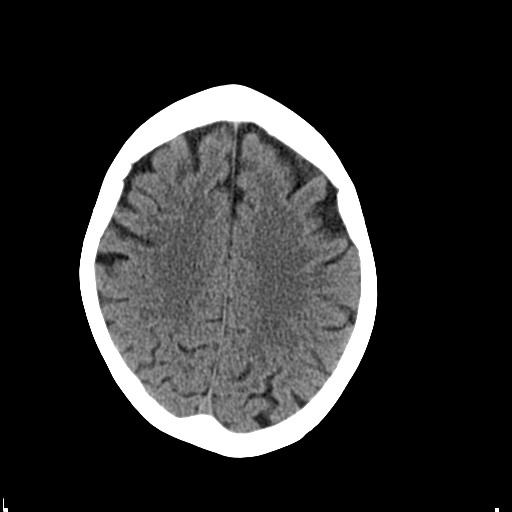
[im 22/29  brain]
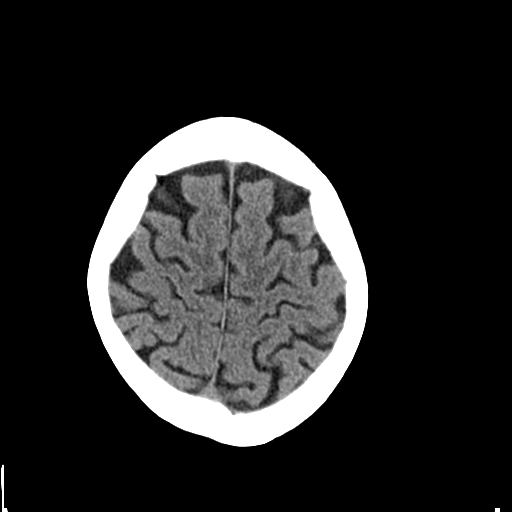
[im 24/29  brain]
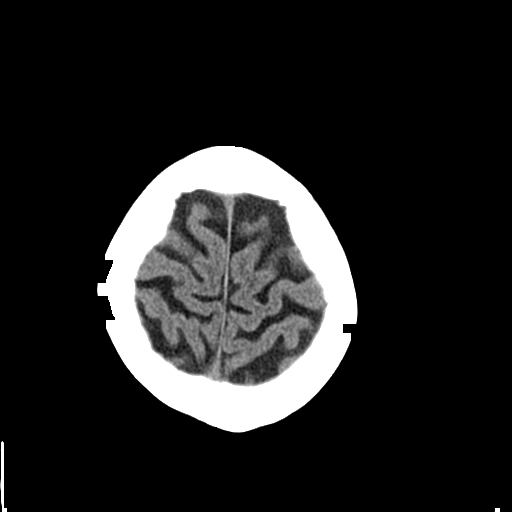
[im 24/29  bone]
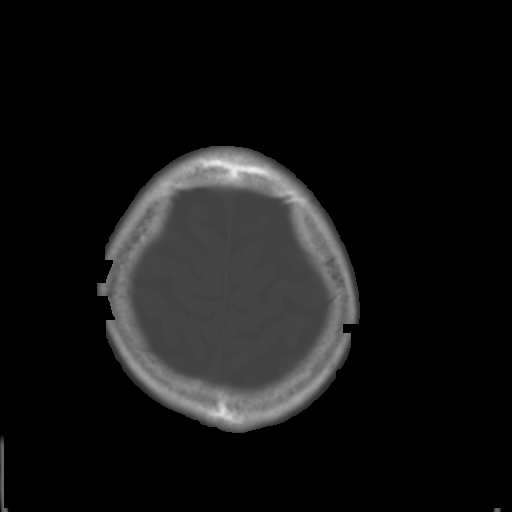
[im 27/29  brain]
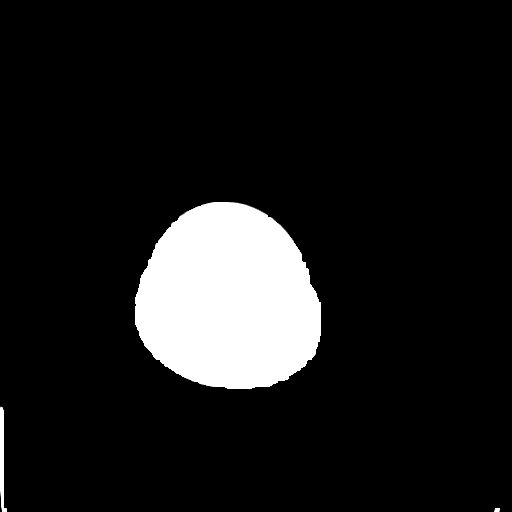

[Series 4: coronal soft tissue · coronal · 0.30mm/px · 3 of 70 slices shown]
[im 24/70  brain]
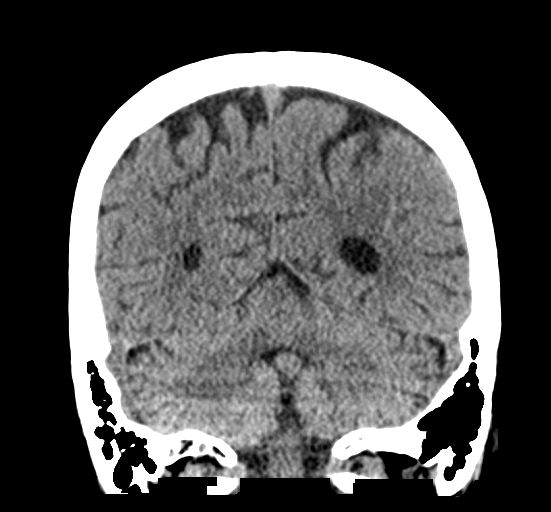
[im 31/70  brain]
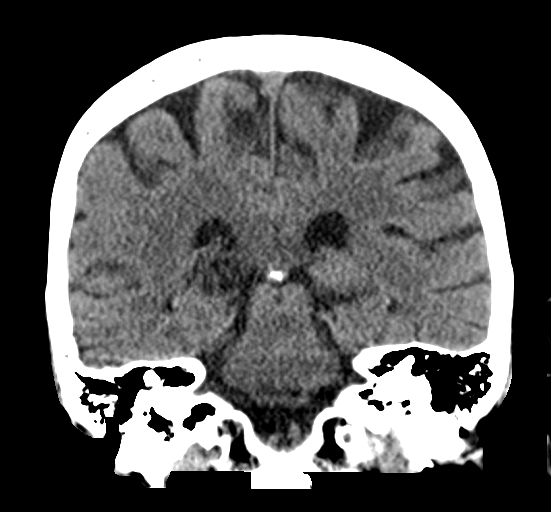
[im 39/70  brain]
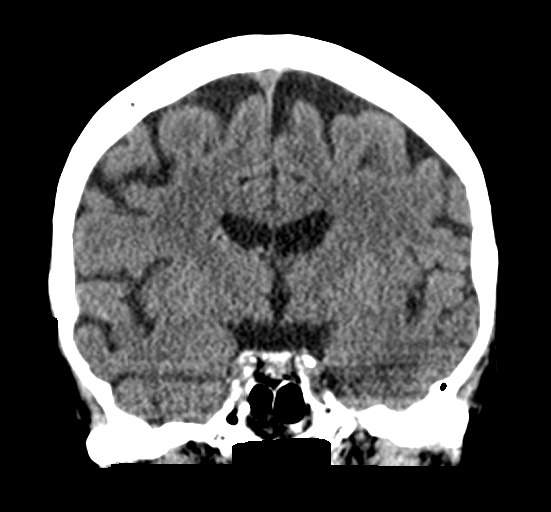

[Series 5: sagittal soft tissue · sagittal · 0.33mm/px · 3 of 58 slices shown]
[im 20/58  brain]
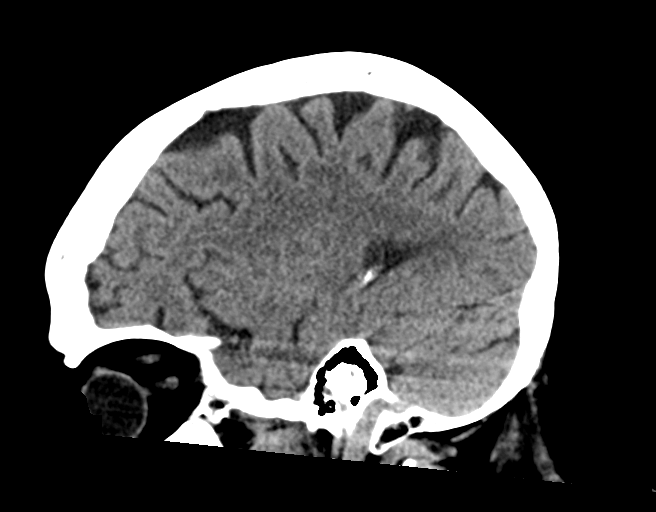
[im 29/58  brain]
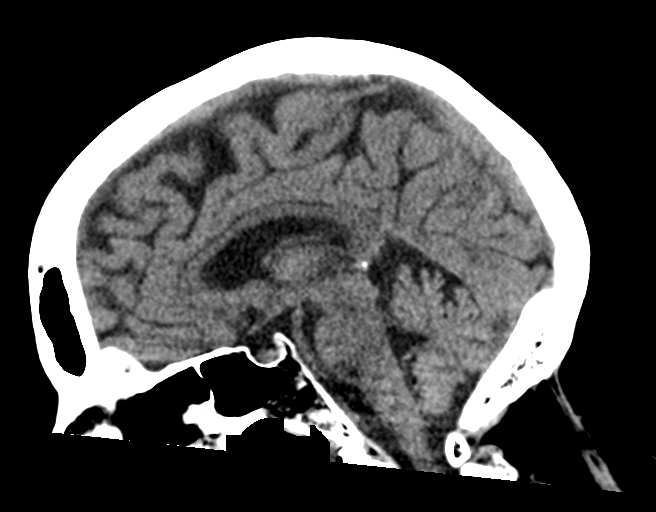
[im 39/58  brain]
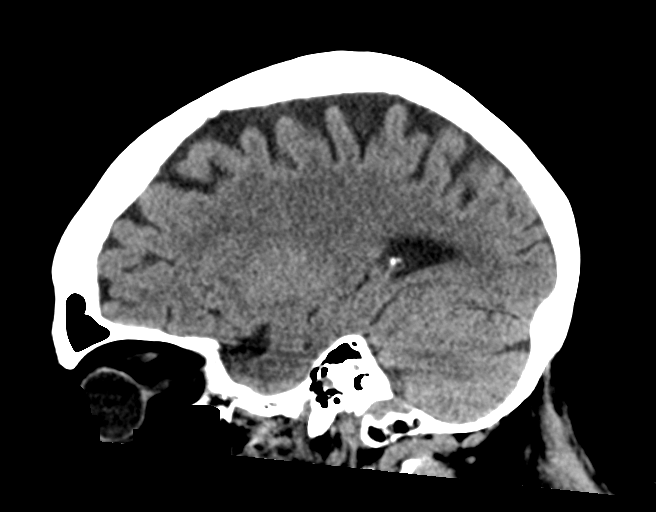

[16 of 46 positions shown; findings below may reference images not displayed]

FINDINGS: Brain: Mild atrophic changes are noted. No findings to suggest acute
hemorrhage, acute infarction or space-occupying mass lesion are
noted.

Vascular: No hyperdense vessel or unexpected calcification.

Skull: Normal. Negative for fracture or focal lesion.

Sinuses/Orbits: No acute finding.

Other: None.
IMPRESSION: Mild atrophic changes without acute intracranial abnormality.
